# Patient Record
Sex: Female | Born: 1980 | Race: Black or African American | Hispanic: No | Marital: Single | Smoking: Current every day smoker
Health system: Southern US, Community
[De-identification: ages and names within clinical notes are randomized; demographics above are authoritative.]

## PROBLEM LIST (undated history)

## (undated) DIAGNOSIS — K5792 Diverticulitis of intestine, part unspecified, without perforation or abscess without bleeding: Secondary | ICD-10-CM

## (undated) DIAGNOSIS — I10 Essential (primary) hypertension: Secondary | ICD-10-CM

## (undated) DIAGNOSIS — E282 Polycystic ovarian syndrome: Secondary | ICD-10-CM

## (undated) DIAGNOSIS — E119 Type 2 diabetes mellitus without complications: Secondary | ICD-10-CM

## (undated) DIAGNOSIS — L732 Hidradenitis suppurativa: Secondary | ICD-10-CM

## (undated) DIAGNOSIS — K219 Gastro-esophageal reflux disease without esophagitis: Secondary | ICD-10-CM

## (undated) DIAGNOSIS — O24419 Gestational diabetes mellitus in pregnancy, unspecified control: Secondary | ICD-10-CM

## (undated) HISTORY — DX: Gastro-esophageal reflux disease without esophagitis: K21.9

## (undated) HISTORY — DX: Polycystic ovarian syndrome: E28.2

## (undated) HISTORY — DX: Diverticulitis of intestine, part unspecified, without perforation or abscess without bleeding: K57.92

## (undated) HISTORY — DX: Hidradenitis suppurativa: L73.2

---

## 1998-10-03 ENCOUNTER — Inpatient Hospital Stay (HOSPITAL_COMMUNITY): Admission: AD | Admit: 1998-10-03 | Discharge: 1998-10-03 | Payer: Self-pay | Admitting: *Deleted

## 1999-01-21 ENCOUNTER — Inpatient Hospital Stay (HOSPITAL_COMMUNITY): Admission: AD | Admit: 1999-01-21 | Discharge: 1999-01-21 | Payer: Self-pay | Admitting: *Deleted

## 2001-01-05 ENCOUNTER — Encounter: Payer: Self-pay | Admitting: Emergency Medicine

## 2001-01-05 ENCOUNTER — Emergency Department (HOSPITAL_COMMUNITY): Admission: EM | Admit: 2001-01-05 | Discharge: 2001-01-05 | Payer: Self-pay | Admitting: Emergency Medicine

## 2001-04-24 ENCOUNTER — Emergency Department (HOSPITAL_COMMUNITY): Admission: EM | Admit: 2001-04-24 | Discharge: 2001-04-24 | Payer: Self-pay

## 2002-03-24 ENCOUNTER — Inpatient Hospital Stay (HOSPITAL_COMMUNITY): Admission: AD | Admit: 2002-03-24 | Discharge: 2002-03-24 | Payer: Self-pay | Admitting: Obstetrics and Gynecology

## 2003-02-07 ENCOUNTER — Emergency Department (HOSPITAL_COMMUNITY): Admission: EM | Admit: 2003-02-07 | Discharge: 2003-02-08 | Payer: Self-pay | Admitting: Emergency Medicine

## 2003-02-08 ENCOUNTER — Emergency Department (HOSPITAL_COMMUNITY): Admission: EM | Admit: 2003-02-08 | Discharge: 2003-02-08 | Payer: Self-pay | Admitting: Emergency Medicine

## 2003-03-25 ENCOUNTER — Ambulatory Visit: Admission: RE | Admit: 2003-03-25 | Discharge: 2003-03-25 | Payer: Self-pay | Admitting: Emergency Medicine

## 2003-03-25 ENCOUNTER — Encounter: Payer: Self-pay | Admitting: Cardiology

## 2003-06-09 ENCOUNTER — Emergency Department (HOSPITAL_COMMUNITY): Admission: EM | Admit: 2003-06-09 | Discharge: 2003-06-09 | Payer: Self-pay | Admitting: Family Medicine

## 2003-06-10 ENCOUNTER — Emergency Department (HOSPITAL_COMMUNITY): Admission: EM | Admit: 2003-06-10 | Discharge: 2003-06-10 | Payer: Self-pay | Admitting: Family Medicine

## 2003-06-11 ENCOUNTER — Ambulatory Visit (HOSPITAL_COMMUNITY): Admission: RE | Admit: 2003-06-11 | Discharge: 2003-06-11 | Payer: Self-pay | Admitting: Otolaryngology

## 2003-06-12 ENCOUNTER — Ambulatory Visit (HOSPITAL_COMMUNITY): Admission: RE | Admit: 2003-06-12 | Discharge: 2003-06-12 | Payer: Self-pay | Admitting: Internal Medicine

## 2003-06-12 ENCOUNTER — Encounter: Admission: RE | Admit: 2003-06-12 | Discharge: 2003-06-12 | Payer: Self-pay | Admitting: Internal Medicine

## 2003-06-18 ENCOUNTER — Encounter: Admission: RE | Admit: 2003-06-18 | Discharge: 2003-06-18 | Payer: Self-pay | Admitting: Internal Medicine

## 2003-07-21 ENCOUNTER — Emergency Department (HOSPITAL_COMMUNITY): Admission: EM | Admit: 2003-07-21 | Discharge: 2003-07-21 | Payer: Self-pay | Admitting: Family Medicine

## 2003-09-15 ENCOUNTER — Emergency Department (HOSPITAL_COMMUNITY): Admission: EM | Admit: 2003-09-15 | Discharge: 2003-09-15 | Payer: Self-pay | Admitting: Family Medicine

## 2004-05-09 ENCOUNTER — Emergency Department (HOSPITAL_COMMUNITY): Admission: EM | Admit: 2004-05-09 | Discharge: 2004-05-09 | Payer: Self-pay | Admitting: Family Medicine

## 2004-07-29 ENCOUNTER — Emergency Department (HOSPITAL_COMMUNITY): Admission: EM | Admit: 2004-07-29 | Discharge: 2004-07-29 | Payer: Self-pay | Admitting: Family Medicine

## 2004-08-03 ENCOUNTER — Ambulatory Visit: Payer: Self-pay | Admitting: Internal Medicine

## 2004-09-11 ENCOUNTER — Emergency Department (HOSPITAL_COMMUNITY): Admission: EM | Admit: 2004-09-11 | Discharge: 2004-09-11 | Payer: Self-pay | Admitting: Family Medicine

## 2004-10-11 ENCOUNTER — Emergency Department (HOSPITAL_COMMUNITY): Admission: EM | Admit: 2004-10-11 | Discharge: 2004-10-11 | Payer: Self-pay | Admitting: Family Medicine

## 2004-12-30 ENCOUNTER — Emergency Department (HOSPITAL_COMMUNITY): Admission: EM | Admit: 2004-12-30 | Discharge: 2004-12-30 | Payer: Self-pay | Admitting: Family Medicine

## 2005-05-11 ENCOUNTER — Emergency Department (HOSPITAL_COMMUNITY): Admission: EM | Admit: 2005-05-11 | Discharge: 2005-05-11 | Payer: Self-pay | Admitting: Family Medicine

## 2005-09-23 ENCOUNTER — Emergency Department (HOSPITAL_COMMUNITY): Admission: EM | Admit: 2005-09-23 | Discharge: 2005-09-23 | Payer: Self-pay | Admitting: Emergency Medicine

## 2006-03-02 ENCOUNTER — Ambulatory Visit: Payer: Self-pay | Admitting: Internal Medicine

## 2006-03-02 DIAGNOSIS — K219 Gastro-esophageal reflux disease without esophagitis: Secondary | ICD-10-CM | POA: Insufficient documentation

## 2006-03-21 ENCOUNTER — Encounter (INDEPENDENT_AMBULATORY_CARE_PROVIDER_SITE_OTHER): Payer: Self-pay | Admitting: Internal Medicine

## 2006-03-21 ENCOUNTER — Ambulatory Visit: Payer: Self-pay | Admitting: *Deleted

## 2006-03-21 LAB — CONVERTED CEMR LAB
BUN: 8 mg/dL
CO2: 21 meq/L
Calcium: 9 mg/dL
Chloride: 107 meq/L
Cholesterol: 183 mg/dL
Creatinine, Ser: 0.68 mg/dL
Glucose, Bld: 116 mg/dL — ABNORMAL HIGH
HDL: 33 mg/dL — ABNORMAL LOW
LDL Cholesterol: 122 mg/dL — ABNORMAL HIGH
Potassium: 4.1 meq/L
Sodium: 141 meq/L
Total CHOL/HDL Ratio: 5.5
Triglycerides: 141 mg/dL
VLDL: 28 mg/dL

## 2006-04-17 ENCOUNTER — Emergency Department (HOSPITAL_COMMUNITY): Admission: EM | Admit: 2006-04-17 | Discharge: 2006-04-17 | Payer: Self-pay | Admitting: Emergency Medicine

## 2006-07-13 ENCOUNTER — Ambulatory Visit: Payer: Self-pay | Admitting: Internal Medicine

## 2006-07-13 ENCOUNTER — Encounter (INDEPENDENT_AMBULATORY_CARE_PROVIDER_SITE_OTHER): Payer: Self-pay | Admitting: Internal Medicine

## 2006-07-13 LAB — CONVERTED CEMR LAB
Hemoglobin, Urine: NEGATIVE
Nitrite: NEGATIVE
Specific Gravity, Urine: 1.031 — ABNORMAL HIGH (ref 1.005–1.03)
Trichomonal Vaginitis: NEGATIVE
Urine Glucose: NEGATIVE mg/dL
Urobilinogen, UA: 1 (ref 0.0–1.0)

## 2006-07-16 ENCOUNTER — Telehealth (INDEPENDENT_AMBULATORY_CARE_PROVIDER_SITE_OTHER): Payer: Self-pay | Admitting: *Deleted

## 2006-07-27 ENCOUNTER — Ambulatory Visit: Payer: Self-pay | Admitting: Internal Medicine

## 2006-07-27 DIAGNOSIS — E282 Polycystic ovarian syndrome: Secondary | ICD-10-CM | POA: Insufficient documentation

## 2006-10-16 ENCOUNTER — Telehealth (INDEPENDENT_AMBULATORY_CARE_PROVIDER_SITE_OTHER): Payer: Self-pay | Admitting: *Deleted

## 2006-10-17 ENCOUNTER — Encounter (INDEPENDENT_AMBULATORY_CARE_PROVIDER_SITE_OTHER): Payer: Self-pay | Admitting: Internal Medicine

## 2006-10-17 ENCOUNTER — Ambulatory Visit: Payer: Self-pay | Admitting: Internal Medicine

## 2006-11-22 ENCOUNTER — Encounter (INDEPENDENT_AMBULATORY_CARE_PROVIDER_SITE_OTHER): Payer: Self-pay | Admitting: Internal Medicine

## 2006-11-26 ENCOUNTER — Ambulatory Visit: Payer: Self-pay | Admitting: Hospitalist

## 2006-12-05 ENCOUNTER — Encounter (INDEPENDENT_AMBULATORY_CARE_PROVIDER_SITE_OTHER): Payer: Self-pay | Admitting: Internal Medicine

## 2006-12-06 ENCOUNTER — Emergency Department (HOSPITAL_COMMUNITY): Admission: EM | Admit: 2006-12-06 | Discharge: 2006-12-06 | Payer: Self-pay | Admitting: Emergency Medicine

## 2006-12-12 ENCOUNTER — Ambulatory Visit: Payer: Self-pay | Admitting: Obstetrics and Gynecology

## 2006-12-14 ENCOUNTER — Ambulatory Visit (HOSPITAL_COMMUNITY): Admission: RE | Admit: 2006-12-14 | Discharge: 2006-12-14 | Payer: Self-pay | Admitting: Obstetrics & Gynecology

## 2007-02-13 ENCOUNTER — Ambulatory Visit: Payer: Self-pay | Admitting: Obstetrics and Gynecology

## 2007-12-24 ENCOUNTER — Emergency Department (HOSPITAL_COMMUNITY): Admission: EM | Admit: 2007-12-24 | Discharge: 2007-12-24 | Payer: Self-pay | Admitting: Emergency Medicine

## 2008-02-24 ENCOUNTER — Ambulatory Visit: Payer: Self-pay | Admitting: Internal Medicine

## 2008-02-24 ENCOUNTER — Telehealth (INDEPENDENT_AMBULATORY_CARE_PROVIDER_SITE_OTHER): Payer: Self-pay | Admitting: Internal Medicine

## 2008-02-24 ENCOUNTER — Encounter (INDEPENDENT_AMBULATORY_CARE_PROVIDER_SITE_OTHER): Payer: Self-pay | Admitting: Internal Medicine

## 2008-02-24 DIAGNOSIS — E1165 Type 2 diabetes mellitus with hyperglycemia: Secondary | ICD-10-CM

## 2008-02-24 DIAGNOSIS — E1140 Type 2 diabetes mellitus with diabetic neuropathy, unspecified: Secondary | ICD-10-CM

## 2008-02-24 LAB — CONVERTED CEMR LAB
Chloride: 108 meq/L (ref 96–112)
Hgb A1c MFr Bld: 9.4 %
Hgb A1c MFr Bld: 9.8 % — ABNORMAL HIGH (ref 4.6–6.1)
Potassium: 3.8 meq/L (ref 3.5–5.3)

## 2008-03-04 ENCOUNTER — Ambulatory Visit: Payer: Self-pay | Admitting: *Deleted

## 2008-07-23 ENCOUNTER — Emergency Department (HOSPITAL_COMMUNITY): Admission: EM | Admit: 2008-07-23 | Discharge: 2008-07-23 | Payer: Self-pay | Admitting: Emergency Medicine

## 2008-12-02 ENCOUNTER — Ambulatory Visit: Payer: Self-pay | Admitting: Internal Medicine

## 2008-12-02 ENCOUNTER — Ambulatory Visit (HOSPITAL_COMMUNITY): Admission: RE | Admit: 2008-12-02 | Discharge: 2008-12-02 | Payer: Self-pay | Admitting: Internal Medicine

## 2008-12-02 ENCOUNTER — Encounter: Payer: Self-pay | Admitting: Internal Medicine

## 2008-12-02 DIAGNOSIS — R079 Chest pain, unspecified: Secondary | ICD-10-CM

## 2008-12-02 LAB — CONVERTED CEMR LAB
ALT: 13 units/L (ref 0–35)
Albumin: 3.9 g/dL (ref 3.5–5.2)
Alkaline Phosphatase: 55 units/L (ref 39–117)
BUN: 9 mg/dL (ref 6–23)
Blood Glucose, Fingerstick: 189
CO2: 25 meq/L (ref 19–32)
Calcium: 8.9 mg/dL (ref 8.4–10.5)
Chloride: 107 meq/L (ref 96–112)
Glucose, Bld: 171 mg/dL — ABNORMAL HIGH (ref 70–99)
HCT: 40 % (ref 36.0–46.0)
Hemoglobin: 13 g/dL (ref 12.0–15.0)
Hgb A1c MFr Bld: 7.3 %
MCV: 85.7 fL (ref >=78.0)
Platelets: 276 10*3/uL (ref 150–400)
Potassium: 4.2 meq/L (ref 3.5–5.3)
RBC: 4.67 M/uL (ref 3.87–5.11)
RDW: 13.4 % (ref 11.5–15.5)
Total Bilirubin: 0.2 mg/dL — ABNORMAL LOW (ref 0.3–1.2)

## 2008-12-04 ENCOUNTER — Ambulatory Visit: Payer: Self-pay | Admitting: Infectious Diseases

## 2008-12-04 ENCOUNTER — Encounter: Payer: Self-pay | Admitting: Internal Medicine

## 2008-12-04 LAB — CONVERTED CEMR LAB
LDL Cholesterol: 136 mg/dL — ABNORMAL HIGH (ref 0–99)
Total CHOL/HDL Ratio: 5.7
Triglycerides: 118 mg/dL (ref <150)
VLDL: 24 mg/dL (ref 0–40)

## 2009-03-02 ENCOUNTER — Emergency Department (HOSPITAL_COMMUNITY): Admission: EM | Admit: 2009-03-02 | Discharge: 2009-03-03 | Payer: Self-pay | Admitting: Emergency Medicine

## 2009-04-19 ENCOUNTER — Ambulatory Visit: Payer: Self-pay | Admitting: Internal Medicine

## 2009-04-22 ENCOUNTER — Emergency Department (HOSPITAL_COMMUNITY): Admission: EM | Admit: 2009-04-22 | Discharge: 2009-04-22 | Payer: Self-pay | Admitting: Family Medicine

## 2010-01-23 ENCOUNTER — Encounter: Payer: Self-pay | Admitting: Obstetrics and Gynecology

## 2010-02-01 NOTE — Assessment & Plan Note (Signed)
Summary: TB JOB/HAVE THE PAPER / SB.  Nurse Visit   Allergies: No Known Drug Allergies  Immunizations Administered:  PPD Skin Test:    Vaccine Type: PPD    Site: right forearm    Mfr: Sanofi Pasteur    Dose: 0.1 ml    Route: ID    Given by: Morrison Old RN    Exp. Date: 10/15/2010    Lot #: [id number]  Orders Added: 1)  TB Skin Test [86580] 2)  Admin 1st Vaccine [id number]

## 2010-03-15 ENCOUNTER — Inpatient Hospital Stay (INDEPENDENT_AMBULATORY_CARE_PROVIDER_SITE_OTHER)
Admission: RE | Admit: 2010-03-15 | Discharge: 2010-03-15 | Disposition: A | Discharge status: Home / Self Care | Payer: Self-pay | Source: Ambulatory Visit | Attending: Emergency Medicine | Admitting: Emergency Medicine

## 2010-03-15 ENCOUNTER — Inpatient Hospital Stay (HOSPITAL_COMMUNITY)
Admission: AD | Admit: 2010-03-15 | Discharge: 2010-03-16 | Disposition: A | Discharge status: Home / Self Care | Payer: Self-pay | Source: Ambulatory Visit | Attending: Obstetrics & Gynecology | Admitting: Obstetrics & Gynecology

## 2010-03-15 DIAGNOSIS — N949 Unspecified condition associated with female genital organs and menstrual cycle: Secondary | ICD-10-CM | POA: Insufficient documentation

## 2010-03-15 DIAGNOSIS — N920 Excessive and frequent menstruation with regular cycle: Secondary | ICD-10-CM

## 2010-03-15 DIAGNOSIS — R109 Unspecified abdominal pain: Secondary | ICD-10-CM | POA: Insufficient documentation

## 2010-03-15 DIAGNOSIS — N390 Urinary tract infection, site not specified: Secondary | ICD-10-CM | POA: Insufficient documentation

## 2010-03-15 DIAGNOSIS — R10819 Abdominal tenderness, unspecified site: Secondary | ICD-10-CM

## 2010-03-15 DIAGNOSIS — K5900 Constipation, unspecified: Secondary | ICD-10-CM

## 2010-03-15 LAB — CBC
Hemoglobin: 12.1 g/dL (ref 12.0–15.0)
MCH: 27.3 pg (ref 26.0–34.0)
MCHC: 31.8 g/dL (ref 30.0–36.0)
Platelets: 244 10*3/uL (ref 150–400)
RBC: 4.43 MIL/uL (ref 3.87–5.11)
RDW: 13.1 % (ref 11.5–15.5)
WBC: 14.1 10*3/uL — ABNORMAL HIGH (ref 4.0–10.5)

## 2010-03-15 LAB — DIFFERENTIAL
Basophils Absolute: 0.1 10*3/uL (ref 0.0–0.1)
Basophils Relative: 0 % (ref 0–1)
Eosinophils Absolute: 0.3 10*3/uL (ref 0.0–0.7)
Lymphocytes Relative: 27 % (ref 12–46)
Monocytes Absolute: 0.9 10*3/uL (ref 0.1–1.0)

## 2010-03-15 LAB — POCT URINALYSIS DIPSTICK: pH: 6 (ref 5.0–8.0)

## 2010-03-16 ENCOUNTER — Inpatient Hospital Stay (HOSPITAL_COMMUNITY): Payer: Self-pay

## 2010-03-16 LAB — COMPREHENSIVE METABOLIC PANEL
ALT: 12 U/L (ref 0–35)
AST: 11 U/L (ref 0–37)
Albumin: 3.4 g/dL — ABNORMAL LOW (ref 3.5–5.2)
Alkaline Phosphatase: 53 U/L (ref 39–117)
BUN: 5 mg/dL — ABNORMAL LOW (ref 6–23)
CO2: 26 mEq/L (ref 19–32)
Calcium: 8.7 mg/dL (ref 8.4–10.5)
Chloride: 104 mEq/L (ref 96–112)
Creatinine, Ser: 0.64 mg/dL (ref 0.4–1.2)
GFR calc Af Amer: >60 mL/min (ref >60)
GFR calc non Af Amer: >60 mL/min (ref >60)
Glucose, Bld: 178 mg/dL — ABNORMAL HIGH (ref 70–99)
Potassium: 3.7 mEq/L (ref 3.5–5.1)
Sodium: 136 mEq/L (ref 135–145)
Total Bilirubin: 0.4 mg/dL (ref 0.3–1.2)
Total Protein: 6.5 g/dL (ref 6.0–8.3)

## 2010-03-16 LAB — GC/CHLAMYDIA PROBE AMP, GENITAL
Chlamydia, DNA Probe: NEGATIVE
GC Probe Amp, Genital: NEGATIVE

## 2010-03-16 LAB — URINE MICROSCOPIC-ADD ON

## 2010-03-16 LAB — URINALYSIS, ROUTINE W REFLEX MICROSCOPIC
Bilirubin Urine: NEGATIVE
Glucose, UA: 100 mg/dL — AB
Ketones, ur: 15 mg/dL — AB
Leukocytes, UA: NEGATIVE
Nitrite: POSITIVE — AB
Protein, ur: NEGATIVE mg/dL
Specific Gravity, Urine: >1.03 — ABNORMAL HIGH (ref 1.005–1.030)
Urobilinogen, UA: 1 mg/dL (ref 0.0–1.0)
pH: 6 (ref 5.0–8.0)

## 2010-03-16 LAB — WET PREP, GENITAL: Trich, Wet Prep: NONE SEEN

## 2010-03-27 LAB — CBC
HCT: 37.7 % (ref 36.0–46.0)
Hemoglobin: 12.4 g/dL (ref 12.0–15.0)
MCHC: 33 g/dL (ref 30.0–36.0)
MCV: 86.4 fL (ref 78.0–100.0)
RDW: 13.6 % (ref 11.5–15.5)

## 2010-03-27 LAB — DIFFERENTIAL
Basophils Relative: 0 % (ref 0–1)
Eosinophils Absolute: 0.4 10*3/uL (ref 0.0–0.7)
Eosinophils Relative: 3 % (ref 0–5)
Lymphocytes Relative: 33 % (ref 12–46)
Lymphs Abs: 4.4 10*3/uL — ABNORMAL HIGH (ref 0.7–4.0)
Neutrophils Relative %: 57 % (ref 43–77)

## 2010-03-27 LAB — URINALYSIS, ROUTINE W REFLEX MICROSCOPIC
Bilirubin Urine: NEGATIVE
Glucose, UA: >1000 mg/dL — AB
Hgb urine dipstick: NEGATIVE
Nitrite: NEGATIVE
Protein, ur: NEGATIVE mg/dL
Specific Gravity, Urine: 1.028 (ref 1.005–1.030)

## 2010-03-27 LAB — URINE MICROSCOPIC-ADD ON

## 2010-03-27 LAB — COMPREHENSIVE METABOLIC PANEL
AST: 17 U/L (ref 0–37)
Alkaline Phosphatase: 59 U/L (ref 39–117)
Calcium: 8.7 mg/dL (ref 8.4–10.5)
Creatinine, Ser: 0.59 mg/dL (ref 0.4–1.2)
GFR calc Af Amer: >60 mL/min (ref >60)
Potassium: 3.4 mEq/L — ABNORMAL LOW (ref 3.5–5.1)

## 2010-04-05 LAB — GLUCOSE, CAPILLARY: Glucose-Capillary: 189 mg/dL — ABNORMAL HIGH (ref 70–99)

## 2010-04-10 LAB — POCT CARDIAC MARKERS
CKMB, poc: <1 ng/mL — ABNORMAL LOW (ref 1.0–8.0)
Myoglobin, poc: 33.5 ng/mL (ref 12–200)

## 2010-04-10 LAB — POCT I-STAT, CHEM 8
BUN: 9 mg/dL (ref 6–23)
Creatinine, Ser: 0.8 mg/dL (ref 0.4–1.2)
Potassium: 3.7 mEq/L (ref 3.5–5.1)
Sodium: 142 mEq/L (ref 135–145)

## 2010-04-20 ENCOUNTER — Ambulatory Visit (INDEPENDENT_AMBULATORY_CARE_PROVIDER_SITE_OTHER): Payer: Self-pay | Admitting: *Deleted

## 2010-04-20 DIAGNOSIS — Z111 Encounter for screening for respiratory tuberculosis: Secondary | ICD-10-CM

## 2010-05-10 ENCOUNTER — Encounter: Payer: Self-pay | Admitting: Internal Medicine

## 2010-05-17 NOTE — Group Therapy Note (Signed)
NAMEKARAH, CARUTHERS           ACCOUNT NO.:  [id number]   MEDICAL RECORD NO.:  [id number]          PATIENT TYPE:  WOC   LOCATION:  [facility]                   FACILITY:  [facility]   PHYSICIAN:  Andrew Au, MD        DATE OF BIRTH:  04-04-1980   DATE OF SERVICE:  12/12/2006                                  CLINIC NOTE   The patient is a 30 year old African American female nulligravida  weighing 299 pounds and is 6 feet tall, who was referred from the  emergency room where she was seen a couple months ago because of a  furuncle under the breasts.  She had a Pap smear in the recent past and  was referred because of amenorrhea.  In discussing with the patient she  goes sometimes more than a year without a period and extraordinarily  heavy. She has never been treated or told she had polycystic ovarian  syndrome and we have gone into detail on what this condition entails as  well as insulin sensitivity. After discussion with decided that we are  going to start the patient on Provera 10 mg b.i.d. for 5 days every 45-  60 days if she does not have a period.  See her back in a couple months,  review her laboratory tests. Consider putting her on Glucophage at that  time. Also we are suggesting she calls her primary to refer her to  neurosurgeon as it sounds from her history with a numbness in her arms  and pain down her back that she probably has a cervical disk or a  radiculopathy of some type. The patient is morbidly obese, has some  moderate hirsutism, especially on the face and lower abdomen and has not  had a period in 3 years.  I am also going to get an ultrasound to  evaluate the patient's ovaries.   IMPRESSION:  1. Polycystic ovarian syndrome.  2. Morbid obesity.           ______________________________  Andrew Au, MD     PR/MEDQ  D:  12/12/2006  T:  12/13/2006  Job:  [id number]

## 2010-05-17 NOTE — Group Therapy Note (Signed)
NAMEJILLANE, PO           ACCOUNT NO.:  [id number]   MEDICAL RECORD NO.:  [id number]          PATIENT TYPE:  WOC   LOCATION:  [facility]                   FACILITY:  [facility]   PHYSICIAN:  Andrew Au, MD        DATE OF BIRTH:  [birth year]   DATE OF SERVICE:                                  CLINIC NOTE   The patient is a 30 year old African American female, nulligravida  weighing 297 pounds and 6 feet tall who was in for follow up for  possible polycystic ovarian syndrome.  She had a random glucose of 133  at her last visit with a hemoglobin A1c of 6.9, slightly elevated, and  estradiol of 18.1.  We gave her withdrawal challenge with Provera, and  she did withdraw after several weeks.  She is back in for follow up.  She continues having neck and arm pain and radiating down and causing  numbness in her hand, especially on the right side.  She complains of  generalized myalgias and frequent chest pain which brought her into the  emergency room most recently at [facility].  She is in [place] and we are sending her for internal medicine evaluation.  Also, we are going to draw an [place], LH, and a free testosterone on the  patient, and we are starting her on Glucophage t.i.d. with meals.  I  want her to come back in a couple months to see if her periods have  gotten any better on that regime.  We may do a 3-hour glucose tolerance  at that time.  Also, I am going to refer for an MRI of the cervical  spine because I think she does have a radiculopathy of some type.   IMPRESSION:  1. Polycystic ovarian syndrome.  2. Morbid obesity.  3. Cervical radiculopathy, probable generalized myalgias.  4. Chest pain.           ______________________________  Andrew Au, MD     PR/MEDQ  D:  02/13/2007  T:  02/14/2007  Job:  [id number]

## 2010-08-16 ENCOUNTER — Encounter: Payer: Self-pay | Admitting: Internal Medicine

## 2010-08-30 ENCOUNTER — Encounter: Payer: Self-pay | Admitting: Internal Medicine

## 2010-10-07 LAB — CULTURE, ROUTINE-ABSCESS

## 2010-10-07 LAB — GLUCOSE, CAPILLARY: Glucose-Capillary: 310 mg/dL — ABNORMAL HIGH (ref 70–99)

## 2010-10-17 ENCOUNTER — Telehealth: Payer: Self-pay | Admitting: *Deleted

## 2010-10-17 NOTE — Telephone Encounter (Signed)
Pt calls c/o chest pain x 3 days, "stabbing", states she had this before in April and nothing was wrong, she is referred to the urg care for eval now and then is to call back for f/u appt w/ pcp. She is agreeable and states her mother will drive her to urg care.

## 2010-10-21 ENCOUNTER — Emergency Department (HOSPITAL_COMMUNITY): Payer: Self-pay

## 2010-10-21 ENCOUNTER — Emergency Department (HOSPITAL_COMMUNITY)
Admission: EM | Admit: 2010-10-21 | Discharge: 2010-10-22 | Disposition: A | Discharge status: Home / Self Care | Payer: Self-pay | Attending: Emergency Medicine | Admitting: Emergency Medicine

## 2010-10-21 DIAGNOSIS — M25579 Pain in unspecified ankle and joints of unspecified foot: Secondary | ICD-10-CM | POA: Insufficient documentation

## 2010-10-21 DIAGNOSIS — S93409A Sprain of unspecified ligament of unspecified ankle, initial encounter: Secondary | ICD-10-CM | POA: Insufficient documentation

## 2010-10-21 DIAGNOSIS — K219 Gastro-esophageal reflux disease without esophagitis: Secondary | ICD-10-CM | POA: Insufficient documentation

## 2010-10-21 DIAGNOSIS — X500XXA Overexertion from strenuous movement or load, initial encounter: Secondary | ICD-10-CM | POA: Insufficient documentation

## 2010-10-21 DIAGNOSIS — E669 Obesity, unspecified: Secondary | ICD-10-CM | POA: Insufficient documentation

## 2010-10-21 DIAGNOSIS — M25473 Effusion, unspecified ankle: Secondary | ICD-10-CM | POA: Insufficient documentation

## 2010-10-21 DIAGNOSIS — M25476 Effusion, unspecified foot: Secondary | ICD-10-CM | POA: Insufficient documentation

## 2010-10-21 DIAGNOSIS — R079 Chest pain, unspecified: Secondary | ICD-10-CM | POA: Insufficient documentation

## 2010-10-21 DIAGNOSIS — F17200 Nicotine dependence, unspecified, uncomplicated: Secondary | ICD-10-CM | POA: Insufficient documentation

## 2010-10-21 DIAGNOSIS — E119 Type 2 diabetes mellitus without complications: Secondary | ICD-10-CM | POA: Insufficient documentation

## 2010-10-22 LAB — DIFFERENTIAL
Basophils Absolute: 0 10*3/uL (ref 0.0–0.1)
Basophils Relative: 0 % (ref 0–1)
Eosinophils Absolute: 0.4 K/uL (ref 0.0–0.7)
Eosinophils Relative: 3 % (ref 0–5)
Lymphocytes Relative: 40 % (ref 12–46)
Lymphs Abs: 5.2 K/uL — ABNORMAL HIGH (ref 0.7–4.0)
Monocytes Absolute: 0.8 10*3/uL (ref 0.1–1.0)
Monocytes Relative: 6 % (ref 3–12)
Neutro Abs: 6.7 10*3/uL (ref 1.7–7.7)
Neutrophils Relative %: 51 % (ref 43–77)

## 2010-10-22 LAB — CBC
HCT: 38.2 % (ref 36.0–46.0)
Hemoglobin: 12.4 g/dL (ref 12.0–15.0)
MCH: 27.3 pg (ref 26.0–34.0)
MCHC: 32.5 g/dL (ref 30.0–36.0)
MCV: 84 fL (ref 78.0–100.0)
Platelets: 252 K/uL (ref 150–400)
RBC: 4.55 MIL/uL (ref 3.87–5.11)
RDW: 13.3 % (ref 11.5–15.5)
WBC: 13.1 K/uL — ABNORMAL HIGH (ref 4.0–10.5)

## 2010-10-22 LAB — POCT I-STAT TROPONIN I

## 2010-10-22 LAB — POCT I-STAT, CHEM 8
BUN: 5 mg/dL — ABNORMAL LOW (ref 6–23)
Chloride: 107 mEq/L (ref 96–112)
Creatinine, Ser: 0.6 mg/dL (ref 0.50–1.10)
Glucose, Bld: 131 mg/dL — ABNORMAL HIGH (ref 70–99)
Hemoglobin: 13.9 g/dL (ref 12.0–15.0)
Potassium: 3.7 mEq/L (ref 3.5–5.1)
Sodium: 142 mEq/L (ref 135–145)

## 2010-10-22 LAB — CK TOTAL AND CKMB (NOT AT ARMC)
CK, MB: 2.3 ng/mL (ref 0.3–4.0)
Relative Index: 1.8 (ref 0.0–2.5)
Total CK: 125 U/L (ref 7–177)

## 2010-10-22 LAB — POCT PREGNANCY, URINE: Preg Test, Ur: NEGATIVE

## 2011-03-07 ENCOUNTER — Encounter: Payer: Self-pay | Admitting: Internal Medicine

## 2011-04-19 ENCOUNTER — Ambulatory Visit (INDEPENDENT_AMBULATORY_CARE_PROVIDER_SITE_OTHER): Payer: Self-pay | Admitting: *Deleted

## 2011-04-19 DIAGNOSIS — Z111 Encounter for screening for respiratory tuberculosis: Secondary | ICD-10-CM

## 2011-05-03 ENCOUNTER — Encounter: Payer: Self-pay | Admitting: Internal Medicine

## 2011-05-03 ENCOUNTER — Ambulatory Visit (INDEPENDENT_AMBULATORY_CARE_PROVIDER_SITE_OTHER): Payer: Self-pay | Admitting: Internal Medicine

## 2011-05-03 ENCOUNTER — Encounter: Payer: Self-pay | Admitting: Licensed Clinical Social Worker

## 2011-05-03 VITALS — BP 104/65 | HR 74 | Temp 97.6°F | Ht 73.0 in | Wt 268.9 lb

## 2011-05-03 DIAGNOSIS — R079 Chest pain, unspecified: Secondary | ICD-10-CM

## 2011-05-03 DIAGNOSIS — R109 Unspecified abdominal pain: Secondary | ICD-10-CM | POA: Insufficient documentation

## 2011-05-03 DIAGNOSIS — Z598 Other problems related to housing and economic circumstances: Secondary | ICD-10-CM

## 2011-05-03 DIAGNOSIS — E119 Type 2 diabetes mellitus without complications: Secondary | ICD-10-CM

## 2011-05-03 DIAGNOSIS — Z111 Encounter for screening for respiratory tuberculosis: Secondary | ICD-10-CM

## 2011-05-03 DIAGNOSIS — E282 Polycystic ovarian syndrome: Secondary | ICD-10-CM

## 2011-05-03 DIAGNOSIS — Z3400 Encounter for supervision of normal first pregnancy, unspecified trimester: Secondary | ICD-10-CM

## 2011-05-03 DIAGNOSIS — Z7251 High risk heterosexual behavior: Secondary | ICD-10-CM

## 2011-05-03 DIAGNOSIS — Z5987 Material hardship due to limited financial resources, not elsewhere classified: Secondary | ICD-10-CM

## 2011-05-03 DIAGNOSIS — Z0000 Encounter for general adult medical examination without abnormal findings: Secondary | ICD-10-CM | POA: Insufficient documentation

## 2011-05-03 LAB — COMPREHENSIVE METABOLIC PANEL
ALT: 9 U/L (ref 0–35)
BUN: 6 mg/dL (ref 6–23)
Calcium: 8.8 mg/dL (ref 8.4–10.5)
Glucose, Bld: 103 mg/dL — ABNORMAL HIGH (ref 70–99)
Potassium: 3.9 mEq/L (ref 3.5–5.3)
Sodium: 139 mEq/L (ref 135–145)
Total Bilirubin: 0.5 mg/dL (ref 0.3–1.2)
Total Protein: 6.3 g/dL (ref 6.0–8.3)

## 2011-05-03 LAB — LIPID PANEL
Cholesterol: 153 mg/dL (ref 0–200)
Total CHOL/HDL Ratio: 5.1 Ratio
VLDL: 22 mg/dL (ref 0–40)

## 2011-05-03 LAB — POCT URINE PREGNANCY: Preg Test, Ur: POSITIVE

## 2011-05-03 MED ORDER — PRENATAL MULTIVITAMIN CH: 1.0000 | ORAL_TABLET | Freq: Every day | ORAL | Status: DC

## 2011-05-03 MED ORDER — TUBERCULIN PPD 5 UNIT/0.1ML ID SOLN: 5.0000 [IU] | Freq: Once | INTRADERMAL | Status: DC

## 2011-05-03 NOTE — Progress Notes (Signed)
Subjective:     Patient ID: Misty [name], female   DOB: 06/20/1980, 31 y.o.   MRN: [id number]  HPI  Pt is a 31 y/o F presenting today for routine f/u.  She has not been seen in over one year and has not been taking any medications.  She states her LMP was in Feb 2013 and she is now experiencing lower abdominal pain and bilateral breast tenderness.  She describes both the breast and belly pain as intermittent.  She notes the abdominal pain radiates down into her vaginal and also around into her hips.  She believes she has lost weight.  She admits to nausea that occurs with the smell of food and she has vomited on a few occasions.  The nausea and vomiting occurred at different times of the day.  Describes her vomitus as food and denies bilious or bloody vomit.  She denies h/a, chills, and fever.  She denies vaginal or nipple discharge.  She denies vaginal bleeding.  She has not used any method of birth control b/c she thought she couldn't get pregnant with PCOS.  She admits to being sexually active with her boyfriend of 8 months; they are in a monogamous relationship.  She admits to a long standing hx of irregular periods but notes this improved with time and over the past year she's had regular, monthly cycles until feb 2013.  Review of Systems  Constitutional: Negative for fever, chills, diaphoresis, activity change, appetite change, fatigue and unexpected weight change.  HENT: Negative for hearing loss, congestion and neck stiffness.   Eyes: Negative for photophobia, pain and visual disturbance.  Respiratory: Negative for cough, chest tightness, shortness of breath and wheezing.   Cardiovascular: Negative for chest pain and palpitations.  Gastrointestinal: Negative for abdominal pain, blood in stool and anal bleeding.  Genitourinary: Negative for dysuria, hematuria and difficulty urinating.  Musculoskeletal: Negative for joint swelling.  Neurological: Negative for dizziness, syncope, speech  difficulty, weakness, numbness and headaches. ]     Objective:   Physical Exam VItal signs reviewed and stable. GEN: No apparent distress.  Alert and oriented x 3.  Pleasant, conversant, and cooperative to exam. HEENT: head is autraumatic and normocephalic.  Neck is supple without palpable masses or lymphadenopathy.  No JVD or carotid bruits.  Vision intact.  EOMI.  PERRLA.  Sclerae anicteric.  Conjunctivae without pallor or injection. Mucous membranes are moist.  Oropharynx is without erythema, exudates, or other abnormal lesions.  RESP:  Lungs are clear to ascultation bilaterally with good air movement.  No wheezes, ronchi, or rubs. CARDIOVASCULAR: regular rate, normal rhythm.  Clear S1, S2, no murmurs, gallops, or rubs. ABDOMEN: soft, protuberant, non-tender, non-distended.  Bowels sounds present in all quadrants and slightly diminished.  No palpable masses.  So suprapubic or CVA tenderness EXT: warm and dry.  Peripheral pulses equal, intact, and +2 globally.  No clubbing or cyanosis.  Trace edema in bilateral lower extremities. SKIN: warm and dry with normal turgor.  No rashes or abnormal lesions observed. NEURO: CN II-XII grossly intact.  Muscle strength +5/5 in bilateral upper and lower extremities.  Sensation is grossly intact.  No focal deficit.     Assessment/Plan:

## 2011-05-03 NOTE — Assessment & Plan Note (Addendum)
Patient request placement of PPD test today as a requirement of her job.  Pregnancy is not an indication to this therefore we'll place today and have her return on Friday for interpretation of PPD results.

## 2011-05-03 NOTE — Progress Notes (Signed)
CSW had the opportunity to meet with Ms. Younger and her mother during her scheduled medical appointment.  Ms. Younger informed today of positive pregnancy test.  CSW provided pt with information on Maternity Services through [employer], contact number for the Dept of Social Services to apply for [employer], and [facility] for referrals on pregnancy options (parenting, adoption and abortion).  CSW also provided pt with contact information.  Pt denies add'l needs at this time.  Pt aware CSW is available to assist as needed.

## 2011-05-03 NOTE — Assessment & Plan Note (Addendum)
Given her report of unprotected sex, will check urine for GC, chalmydia, and trich.  Will also check HIV Abs and urine pregnancy test as outlined below.  Counseled patient on importance of contraception to not only prevent pregnancy but to reduce risk of STI.

## 2011-05-03 NOTE — Patient Instructions (Addendum)
We will schedule you an appointment with an obstetrician.  This is very important. Do not miss this appointment. You have been given a prescription for prenatal vitamins. Start taking one vitamin daily to keep you and your baby healthy. You will need to go to the [facility] office and apply for assistance to help with the pregnancy. Come back to the clinic this Friday for check of your ppd. Please do not hesitate to call the clinic at [phone] and ask to speak with Dr. Jerelene Redden or our social worker regarding women's health/pregnancy or adoption services.  We will provide you with a list of pregnancy resources in the area including contact information.  Schedule a time to meet with Bonna Gains to talk about getting the [facility].  This Card will help you be able to get medications and doctor visits. Items required to complete an Eligibility Application   1. Picture ID (Can't be expired) 2. Current Bill to establish proof of residency 3. W-2 & Tax return (if self-employed include "Schedule C"), if not filing Form 4506 4. 4 current Pay stubs for this year 5. Printout of other income (Social security, unemployment, child support, workmen's comp) 6. Food stamp award letter, if receiving  7. Life Insurance (Need copy of the front page, showing name Ins Co. Name, and face amount). 8. Statement for pension, 401-K, IRS (needs to have current balance) 9. Tax Value for cars, houses, mobile homes, and land (Get from [facility] Tax Department) 10. Disability Paperwork (showing status of case) 11. College students: Print out of [place] received, tuition cost, books, etc. 12. If no Income: Games developer of support for free shelter, money, food, Social research officer, government.  Bring all that you can to your follow up appointment to start the process.

## 2011-05-03 NOTE — Assessment & Plan Note (Signed)
Pt is uninsured.  I have asked her to meet with Bonna Gains regarding the orange card.  Will send her a list of required documents.

## 2011-05-03 NOTE — Assessment & Plan Note (Addendum)
Patient has not been taking metformin for many months. Will check CMET, lipids, and urine microalbumin/Cr today.  Hemoglobin A1c within normal limits at 7.1 today.  Will not resume metformin at this time. Patient is encouraged to continue with healthy diet and lifestyle activities.  She will need close monitoring of her blood glucose levels during her pregnancy.

## 2011-05-03 NOTE — Assessment & Plan Note (Addendum)
Urine pregnancy test returned positive.  This was a very unexpected result for the patient. I spent approximately 45 minutes speaking with both patient and her mother regarding positive results of urine pregnancy test.  I expect the patient and is in her first trimester giving the date of her last menstrual period.  Will refer her to an obstetrician today for additional evaluation and management of her pregnancy.  Results of Chlamydia, gonorrhea, Trichomonas, and HIV testing are pending at this time.   Patient is currently not taking any teratogenic medications.  Reviewed options available to patient regarding her pregnancy, regarding options for pregnancy termination, pregnancy to term, adoption services and raising the child herself.  I will send patient home with basic information about medications to avoid during pregnancy, first and second trimester symptoms/issues/problems to account for, as well as information about morning sickness.  Social work is also on board and has provided patient with information regarding seeking Medicaid assistance for her pregnancy as well as other information regarding women's health services, including adoption services and abortion providers so that patient is fully informed and able to seek out any option she decides is in her best interest.  Will send her home with a rx for prenatal vitamins.  Pt instructed to begin taking one tablet daily starting today

## 2011-05-03 NOTE — Assessment & Plan Note (Addendum)
Given her lack of contraception use and no menstruation for the past 3 months, pregnancy is a concern.  Urine pregnancy test is positive.  I believe her symptoms of abdominal discomfort, nausea with intermittent vomiting, and breast tenderness for the results of her pregnancy.  Please see discussion above under "pregnancy, first" for additional details regarding the assessment and plan

## 2011-05-03 NOTE — Assessment & Plan Note (Signed)
>>  ASSESSMENT AND PLAN FOR DIABETES TYPE 2, UNCONTROLLED WRITTEN ON 05/03/2011 11:01 AM BY MILLS, KRISTIN T, MD  Patient has not been taking metformin for many months. Will check CMET, lipids, and urine microalbumin/Cr today.  Hemoglobin A1c within normal limits at 7.1 today.  Will not resume metformin at this time. Patient is encouraged to continue with healthy diet and lifestyle activities.  She will need close monitoring of her blood glucose levels during her pregnancy.

## 2011-05-04 LAB — HIV ANTIBODY (ROUTINE TESTING W REFLEX): HIV: NONREACTIVE

## 2011-05-04 LAB — MICROALBUMIN / CREATININE URINE RATIO
Creatinine, Urine: 255.8 mg/dL
Microalb, Ur: 1.23 mg/dL (ref 0.00–1.89)

## 2011-05-05 LAB — TB SKIN TEST: TB Skin Test: NEGATIVE mm

## 2011-05-10 ENCOUNTER — Encounter (HOSPITAL_COMMUNITY): Payer: Self-pay

## 2011-05-10 ENCOUNTER — Inpatient Hospital Stay (HOSPITAL_COMMUNITY): Payer: Medicaid Other

## 2011-05-10 ENCOUNTER — Inpatient Hospital Stay (HOSPITAL_COMMUNITY)
Admission: AD | Admit: 2011-05-10 | Discharge: 2011-05-10 | Disposition: A | Discharge status: Home / Self Care | Payer: Medicaid Other | Source: Ambulatory Visit | Attending: Obstetrics & Gynecology | Admitting: Obstetrics & Gynecology

## 2011-05-10 DIAGNOSIS — A499 Bacterial infection, unspecified: Secondary | ICD-10-CM | POA: Insufficient documentation

## 2011-05-10 DIAGNOSIS — Z3490 Encounter for supervision of normal pregnancy, unspecified, unspecified trimester: Secondary | ICD-10-CM

## 2011-05-10 DIAGNOSIS — R109 Unspecified abdominal pain: Secondary | ICD-10-CM | POA: Insufficient documentation

## 2011-05-10 DIAGNOSIS — O2390 Unspecified genitourinary tract infection in pregnancy, unspecified trimester: Secondary | ICD-10-CM | POA: Insufficient documentation

## 2011-05-10 DIAGNOSIS — B9689 Other specified bacterial agents as the cause of diseases classified elsewhere: Secondary | ICD-10-CM | POA: Insufficient documentation

## 2011-05-10 DIAGNOSIS — N760 Acute vaginitis: Secondary | ICD-10-CM | POA: Insufficient documentation

## 2011-05-10 DIAGNOSIS — Z1389 Encounter for screening for other disorder: Secondary | ICD-10-CM

## 2011-05-10 LAB — WET PREP, GENITAL

## 2011-05-10 MED ORDER — METRONIDAZOLE 500 MG PO TABS: 500.0000 mg | ORAL_TABLET | Freq: Two times a day (BID) | ORAL | Status: AC

## 2011-05-10 NOTE — MAU Provider Note (Signed)
Chief Complaint:  Abdominal Pain    First Provider Initiated Contact with Patient 05/10/11 1623      Misty [name] is  31 y.o. G1P0.  Patient's last menstrual period was 03/02/2011.Marland Kitchen  Her pregnancy status is positive.  She presents complaining of Abdominal Pain . Onset is described as intermittent and has been present for  1 weeks. + UPT at Dr. Jerelene Redden office last week. On-going intermittent lower abd cramping. Denies vaginal blding, discharge, dysuria, or back pain.  Obstetrical/Gynecological History: OB History    Grav Para Term Preterm Abortions TAB SAB Ect Mult Living   1               Past Medical History: Past Medical History  Diagnosis Date  . PCOD (polycystic ovarian disease)     Follow[facility] by [facility], has been on metformin  . Chest pain     multiple ED evaluations, most likely musculoskeletal (cervical radiculopathy), needs MRI spine  . GERD (gastroesophageal reflux disease)   . Diabetes mellitus     Past Surgical History: History reviewed. No pertinent past surgical history.  Family History: Family History  Problem Relation Age of Onset  . Diabetes Mother   . Hypertension Mother   . Heart disease Father   . Diabetes Sister   . Hypertension Sister   . Diabetes Brother   . Cancer Maternal Grandmother   . Cancer Paternal Grandmother     Social History: History  Substance Use Topics  . Smoking status: Current Everyday Smoker -- 0.5 packs/day    Types: Cigarettes  . Smokeless tobacco: Not on file  . Alcohol Use: No    Allergies: No Known Allergies  Prescriptions prior to admission  Medication Sig Dispense Refill  . acetaminophen (TYLENOL) 500 MG tablet Take 500 mg by mouth every 6 (six) hours as needed. For pain      . Prenatal Vit-Fe Fumarate-FA (PRENATAL MULTIVITAMIN) TABS Take 1 tablet by mouth daily.  30 tablet  11    Review of Systems - General ROS: negative Respiratory ROS: no cough, shortness of breath, or wheezing Cardiovascular ROS: no chest  pain or dyspnea on exertion Gastrointestinal ROS: positive for - abdominal pain Genito-Urinary ROS: no dysuria, trouble voiding, or hematuria  Physical Exam   Blood pressure 124/51, pulse 99, temperature 98.8 F (37.1 C), temperature source Oral, resp. rate 16, height 6' 1"  (1.854 m), weight 268 lb (121.564 kg), last menstrual period 03/02/2011, SpO2 100.00%.  General: General appearance - alert, well appearing, and in no distress, oriented to person, place, and time and overweight Mental status - alert, oriented to person, place, and time, normal mood, behavior, speech, dress, motor activity, and thought processes, affect appropriate to mood Abdomen - soft, nontender, nondistended, no masses or organomegaly Musculoskeletal - no joint tenderness, deformity or swelling Focused Gynecological Exam: VULVA: normal appearing vulva with no masses, tenderness or lesions, VAGINA: normal appearing vagina with normal color and discharge, no lesions, CERVIX: normal appearing cervix without discharge or lesions, UTERUS: enlarged, non tender, ADNEXA: normal adnexa in size, nontender and no masses  Labs: Recent Results (from the past 24 hour(s))  POCT PREGNANCY, URINE   Collection Time   05/10/11  3:08 PM      Component Value Range   Preg Test, Ur POSITIVE (*) NEGATIVE    Imaging Studies:  *RADIOLOGY REPORT*  Clinical Data:  Positive pregnancy test. Left lower quadrant pain.  OBSTETRIC <14 WK ULTRASOUND  Technique: Transabdominal ultrasound was performed for evaluation  of  the gestation as well as the maternal uterus and adnexal  regions.  Comparison: None.  Intrauterine gestational sac:  Single intrauterine station of sac visualized. Gestational sac has  an unremarkable position and configuration.  Yolk sac: Visualized  Embryo: Visualized  Cardiac Activity: Visualized  CRL: 12.4 mm 7w 4.d [place] EDC: 12/23/2011  Maternal uterus/Adnexae:  No evidence for subchorionic hemorrhage. The maternal  ovaries are  unremarkable with probable corpus luteum cyst on the left. No  evidence for free fluid in the cul-de-sac.  IMPRESSION:  Single living intrauterine gestation at estimated 7-week-4-day  gestational age by crown-rump length.  Original Report Authenticated By: ERIC A. MANSELL, M.D.  Assessment: 1. Normal IUP (intrauterine pregnancy) on prenatal ultrasound   2. BV (bacterial vaginosis)      Plan: Discharge home Rx Flagyl FU with OB Provider of choice Referral list and verification letter given  Cadon Raczka E. 05/10/2011,5:23 PM

## 2011-05-10 NOTE — MAU Note (Signed)
Pt states hx abnormal menstrual cycles. Had +upt 05/03/2011, lmp 03/02/2011. Denies vag d/c changes or bleeding. Hx PCOS. Goes to [facility].

## 2011-05-10 NOTE — MAU Provider Note (Signed)
Attestation of Attending Supervision of Advanced Practitioner: Evaluation and management procedures were performed by the [facility] Fellow/PA/CNM/NP under my supervision and collaboration. Chart reviewed, and agree with management and plan.  Verita Schneiders, M.D. 05/10/2011 6:53 PM

## 2011-05-10 NOTE — Discharge Instructions (Signed)
Prenatal Care Providers [facility]  [phone]     Phone: [place]                      [facility]  @[place]     Phone: [phone]  Phone: [place]     Phone: [phone]  Phone: [place] [facility] [place]                hone: [phone]  Phone: [phone]         [facility] [address]      Phone: [phone]  Phone: [phone]         [place] Dept.                Phone: [phone]  [place])          Phone: [phone] [facility]   Phone: [phone]   ________________________________________     To schedule your Maternity Eligibility Appointment, please call [phone].  When you arrive for your appointment you must bring the following items or information listed below.  Your appointment will be rescheduled if you do not have these items or are 15 minutes late. If currently receiving Medicaid, you MUST bring: 1. Medicaid Card 2. Social Security Card 3. Picture ID 4. Proof of Pregnancy 5. Verification of current address if the address on Medicaid card is incorrect "postmarked mail" If not receiving Medicaid, you MUST bring: 1. Social Security Card 2. Picture ID 3. Birth Certificate (if available) Passport or *Green Card 4. Proof of Pregnancy 5. Verification of current address "postmarked mail" for each income presented. 6. Verification of insurance coverage, if any 7. Check stubs from each employer for the previous month (if unable to present check stub  for each week, we will accept check stub for the first and last week ill the same month.) If you can't locate check stubs, you must bring a letter from the  employer(s) and it must have the following information on letterhead, typed, in English: o name of [place] telephone number o how long been with the company, if less than one month o how much person earns per hour o how many hours per week work o the gross pay the person earned for the previous month If you are 33 years old or less, you do not have to bring proof of income unless you work or live with the father of the baby and at that time we will need proof of income from you and/or the father of the baby. Green Card recipients are eligible for Medicaid for Pregnant Women (MPW)   Bacterial Vaginosis Bacterial vaginosis is an infection of the vagina. A healthy vagina has many kinds of good germs (bacteria). Sometimes the number of good germs can change. This allows bad germs to move in and cause an infection. You may be given medicine (antibiotics) to treat the infection. Or, you may not need treatment at all. HOME CARE  Take your medicine as told. Finish them even if you start to feel better.  Do not have sex until you finish your medicine.   Do not douche.   Practice safe sex.   Tell your sex partner that you have an infection. They should see their doctor for treatment if they have problems.  GET HELP RIGHT AWAY IF:  You do not get better after 3 days of treatment.   You have grey fluid (discharge) coming from your vagina.   You have pain.   You have a temperature of 102 F (38.9 C) or higher.  MAKE SURE YOU:   Understand these instructions.   Will watch your condition.   Will get help right away if you are not doing well or get worse.  Document Released: 09/28/2007 Document Revised: 12/08/2010 Document Reviewed: 09/28/2007 [facility] Patient Information [address] [place].

## 2011-05-11 LAB — GC/CHLAMYDIA PROBE AMP, GENITAL
Chlamydia, DNA Probe: NEGATIVE
GC Probe Amp, Genital: NEGATIVE

## 2011-05-25 ENCOUNTER — Telehealth: Payer: Self-pay | Admitting: *Deleted

## 2011-05-25 NOTE — Telephone Encounter (Signed)
Still has not completed papers for Medicaid - will try to do asap. Medicaid office will discuss with pt OB options for care per [facility]. Hilda Blades Shaleta Ruacho RN 05/25/11 3:30PM

## 2011-05-26 ENCOUNTER — Telehealth: Payer: Self-pay | Admitting: *Deleted

## 2011-05-26 NOTE — Telephone Encounter (Signed)
MS Aja CALLED BACK, INSTRUCTED PATIENT TO CALL AND MAKE APPT WITH THE HEALTH DEPARTMENT FOR OB SERVICES. IF SHE IS CONSIDERED HIGH RISK THEN THEY WOULD SEND HER TO THE [facility].  ASKED PATIENT TO CALL [facility] BACK IF AND WHEN SHE GETS A OB APPT . SO WE CAN KEEP IN TOUCH.  Misty [name] NT [phone]  12:06PM

## 2011-05-26 NOTE — Telephone Encounter (Signed)
CALLED AND LEFT VOICE MESSAGE FOR PATIENT TO CALL  BACK, NEED TO KNOW IF SHE HAS GOTTEN OB APPT ANY WHERE. WHEN WE SPOKE Tuesday, WE GOT CUT OFF(CELL PHONE CUT OUT).  Misty [name] NT [phone]  11:56AM

## 2011-07-13 ENCOUNTER — Inpatient Hospital Stay (HOSPITAL_COMMUNITY)
Admission: AD | Admit: 2011-07-13 | Discharge: 2011-07-13 | Disposition: A | Discharge status: Home / Self Care | Payer: Medicaid Other | Source: Ambulatory Visit | Attending: Obstetrics & Gynecology | Admitting: Obstetrics & Gynecology

## 2011-07-13 ENCOUNTER — Encounter (HOSPITAL_COMMUNITY): Payer: Self-pay

## 2011-07-13 DIAGNOSIS — O99891 Other specified diseases and conditions complicating pregnancy: Secondary | ICD-10-CM | POA: Insufficient documentation

## 2011-07-13 DIAGNOSIS — N949 Unspecified condition associated with female genital organs and menstrual cycle: Secondary | ICD-10-CM

## 2011-07-13 DIAGNOSIS — R109 Unspecified abdominal pain: Secondary | ICD-10-CM | POA: Insufficient documentation

## 2011-07-13 DIAGNOSIS — N644 Mastodynia: Secondary | ICD-10-CM

## 2011-07-13 LAB — URINE MICROSCOPIC-ADD ON

## 2011-07-13 LAB — URINALYSIS, ROUTINE W REFLEX MICROSCOPIC
Bilirubin Urine: NEGATIVE
Hgb urine dipstick: NEGATIVE
Ketones, ur: NEGATIVE mg/dL
Nitrite: NEGATIVE
Specific Gravity, Urine: 1.02 (ref 1.005–1.030)
Urobilinogen, UA: 1 mg/dL (ref 0.0–1.0)
pH: 7.5 (ref 5.0–8.0)

## 2011-07-13 NOTE — MAU Note (Signed)
Patient states has been having abdominal pain for 2-3 weeks. Has been having bilateral breast pain and upper back pain for 3 days. Has headaches Has an appointment with [facility] on 7-17

## 2011-07-13 NOTE — MAU Provider Note (Signed)
Vista Deck PierceYounger31 y.o.G1P0000 @[redacted]w[redacted]d  by LMP Chief Complaint  Patient presents with  . Abdominal Pain  . Breast Pain  . Back Pain   First Provider Initiated Contact with Patient 07/13/11 2229     SUBJECTIVE  HPI: HPI: Misty [name] is a 31 y.o. year old G10P0000 female at 71w5dweeks gestation who presents to MAU reporting low abd pain and upper back soreness x 3 weeks and breast tenderness since beginning of the pregnancy. Denies urinary Sx, GI Sx, VB. LOF, vaginal discharge. Has been evaluated for similar Sx a few weeks ago. Dx w/ round ligament pains. [facility] Has taken Tylen for the pain w/ Some improvement. States she just wants to make sure the baby is OK.   Past Medical History  Diagnosis Date  . PCOD (polycystic ovarian disease)     [facility]  . Chest pain     multiple ED evaluations, most likely musculoskeletal (cervical radiculopathy), needs MRI spine  . GERD (gastroesophageal reflux disease)   . Diabetes mellitus     pt states doesn't know if has DM   Past Surgical History  Procedure Date  . No past surgeries    History   Social History  . Marital Status: Single    Spouse Name: N/A    Number of Children: N/A  . Years of Education: N/A   Occupational History  . Not on file.   Social History Main Topics  . Smoking status: Current Everyday Smoker -- 0.5 packs/day    Types: Cigarettes  . Smokeless tobacco: Not on file  . Alcohol Use: No  . Drug Use: No  . Sexually Active: Yes   Other Topics Concern  . Not on file   Social History Narrative  . No narrative on file   No current facility-administered medications on file prior to encounter.   Current Outpatient Prescriptions on File Prior to Encounter  Medication Sig Dispense Refill  . acetaminophen (TYLENOL) 500 MG tablet Take 500 mg by mouth every 6 (six) hours as needed. For pain      . Prenatal Vit-Fe Fumarate-FA (PRENATAL  MULTIVITAMIN) TABS Take 1 tablet by mouth daily.  30 tablet  11   No Known Allergies  ROS: Pertinent items in HPI  OBJECTIVE Blood pressure 129/51, pulse 89, temperature 99.3 F (37.4 C), resp. rate 20, height 5' 11.5" (1.816 m), weight 123.016 kg (271 lb 3.2 oz), last menstrual period 03/02/2011, SpO2 100.00%.  GENERAL: Well-developed, well-nourished female in no acute distress.  HEENT: Normocephalic, good dentition HEART: normal rate BREAST: No masses, erythema, dimpling of skin, or nipple discharge.   RESP: normal effort ABDOMEN: Soft, nontender EXTREMITIES: Nontender, no edema NEURO: Alert and oriented SPECULUM EXAM: deferred BIMANUAL: cervix closed and long; UTA uterus; no adnexal tenderness or masses FHR 149 per doppler  LAB RESULTS  Results for orders placed during the hospital encounter of 07/13/11 (from the past 24 hour(s))  URINALYSIS, ROUTINE W REFLEX MICROSCOPIC     Status: Abnormal   Collection Time   07/13/11  8:05 PM      Component Value Range   Color, Urine YELLOW  YELLOW   APPearance CLOUDY (*) CLEAR   Specific Gravity, Urine 1.020  1.005 - 1.030   pH 7.5  5.0 - 8.0   Glucose, UA NEGATIVE  NEGATIVE mg/dL   Hgb urine dipstick NEGATIVE  NEGATIVE   Bilirubin Urine NEGATIVE  NEGATIVE   Ketones, ur NEGATIVE  NEGATIVE mg/dL   Protein, ur NEGATIVE  NEGATIVE mg/dL   Urobilinogen, UA 1.0  0.0 - 1.0 mg/dL   Nitrite NEGATIVE  NEGATIVE   Leukocytes, UA TRACE (*) NEGATIVE  URINE MICROSCOPIC-ADD ON     Status: Abnormal   Collection Time   07/13/11  8:05 PM      Component Value Range   Squamous Epithelial / LPF FEW (*) RARE   WBC, UA 3-6  <3 WBC/hpf   Bacteria, UA FEW (*) RARE    IMAGING NA  ASSESSMENT  1. Round ligament pain   2. Breast pain in pregnancy     PLAN D/C home Comfort measures Follow-up Information    Follow up with [facility]. (as scheduled)    Contact information:   [address]       Follow up with [facility]. (As needed if symptoms worsen)    Contact information:   Noma Curran [phone]        Medication List  As of 07/13/2011 10:54 PM   TAKE these medications         acetaminophen 500 MG tablet   Commonly known as: TYLENOL   Take 500 mg by mouth every 6 (six) hours as needed. For pain      prenatal multivitamin Tabs   Take 1 tablet by mouth daily.           Misty [name] 07/13/2011 10:54 PM

## 2011-07-19 ENCOUNTER — Other Ambulatory Visit: Payer: Self-pay

## 2011-07-19 LAB — OB RESULTS CONSOLE HEPATITIS B SURFACE ANTIGEN: Hepatitis B Surface Ag: NEGATIVE

## 2011-07-19 LAB — OB RESULTS CONSOLE ABO/RH

## 2011-08-04 ENCOUNTER — Encounter: Payer: Medicaid Other | Admitting: Obstetrics and Gynecology

## 2011-08-09 ENCOUNTER — Other Ambulatory Visit: Payer: Self-pay | Admitting: Nurse Practitioner

## 2011-08-09 DIAGNOSIS — O24419 Gestational diabetes mellitus in pregnancy, unspecified control: Secondary | ICD-10-CM

## 2011-08-17 ENCOUNTER — Encounter (HOSPITAL_COMMUNITY): Payer: Self-pay

## 2011-08-17 ENCOUNTER — Ambulatory Visit (HOSPITAL_COMMUNITY)
Admission: RE | Admit: 2011-08-17 | Discharge: 2011-08-17 | Disposition: A | Discharge status: Home / Self Care | Payer: Medicaid Other | Source: Ambulatory Visit | Attending: Nurse Practitioner | Admitting: Nurse Practitioner

## 2011-08-17 DIAGNOSIS — E282 Polycystic ovarian syndrome: Secondary | ICD-10-CM

## 2011-08-17 DIAGNOSIS — E119 Type 2 diabetes mellitus without complications: Secondary | ICD-10-CM

## 2011-08-17 DIAGNOSIS — Z1389 Encounter for screening for other disorder: Secondary | ICD-10-CM | POA: Insufficient documentation

## 2011-08-17 DIAGNOSIS — O358XX0 Maternal care for other (suspected) fetal abnormality and damage, not applicable or unspecified: Secondary | ICD-10-CM | POA: Insufficient documentation

## 2011-08-17 DIAGNOSIS — E669 Obesity, unspecified: Secondary | ICD-10-CM | POA: Insufficient documentation

## 2011-08-17 DIAGNOSIS — O99210 Obesity complicating pregnancy, unspecified trimester: Secondary | ICD-10-CM | POA: Insufficient documentation

## 2011-08-17 DIAGNOSIS — O99330 Smoking (tobacco) complicating pregnancy, unspecified trimester: Secondary | ICD-10-CM | POA: Insufficient documentation

## 2011-08-17 DIAGNOSIS — Z363 Encounter for antenatal screening for malformations: Secondary | ICD-10-CM | POA: Insufficient documentation

## 2011-08-17 DIAGNOSIS — O24419 Gestational diabetes mellitus in pregnancy, unspecified control: Secondary | ICD-10-CM

## 2011-08-17 DIAGNOSIS — O24919 Unspecified diabetes mellitus in pregnancy, unspecified trimester: Secondary | ICD-10-CM | POA: Insufficient documentation

## 2011-08-17 DIAGNOSIS — Z3400 Encounter for supervision of normal first pregnancy, unspecified trimester: Secondary | ICD-10-CM

## 2011-08-17 NOTE — Progress Notes (Signed)
MATERNAL FETAL MEDICINE CONSULT  Patient Name: Misty [name] Medical Record Number:  [id number] Date of Birth: 01-May-1980 Requesting Physician Name:  Kennon Holter, NP Date of Service: 08/17/2011  Chief Complaint Gestational diabetes mellitus  History of Present Illness [name] was seen today for prenatal diagnosis secondary to gestational diabetes mellitus at the request of Kennon Holter, NP.  The patient is a 31 y.o. G1P0000,at 75w5dwith an EDD of 12/23/2011, by Ultrasound dating method.  Ms. PWolfgang Phoenixreports that she was tested prior to pregnancy and told her test was "borderline", but does not report having a clear diagnosis of diabetes mellitus prior to pregnancy.  She does have a history of PCOS.  A recent hemoglobin A1C was 6.6%.  She reports no other complications in this pregnancy.  Review of Systems Pertinent items are noted in HPI.  Patient History OB History    Grav Para Term Preterm Abortions TAB SAB Ect Mult Living   1 0 0 0 0 0 0 0 0 0      # Outc Date GA Lbr Len/2nd Wgt Sex Del Anes PTL Lv   1 CUR               Past Medical History  Diagnosis Date  . PCOD (polycystic ovarian disease)     [facility]  . Chest pain     multiple ED evaluations, most likely musculoskeletal (cervical radiculopathy), needs MRI spine  . GERD (gastroesophageal reflux disease)   . Diabetes mellitus     pt states doesn't know if has DM    Past Surgical History  Procedure Date  . No past surgeries     History   Social History  . Marital Status: Single    Spouse Name: N/A    Number of Children: N/A  . Years of Education: N/A   Social History Main Topics  . Smoking status: Current Everyday Smoker -- 0.5 packs/day    Types: Cigarettes  . Smokeless tobacco: None  . Alcohol Use: No  . Drug Use: No  . Sexually Active: Yes   Other Topics Concern  . None   Social History Narrative  . None    Family  History  Problem Relation Age of Onset  . Diabetes Mother   . Hypertension Mother   . Heart disease Father   . Diabetes Sister   . Hypertension Sister   . Diabetes Brother   . Cancer Maternal Grandmother   . Cancer Paternal Grandmother    In addition, the patient has no family history of mental retardation, birth defects, or genetic diseases.  Physical Examination Vitals:  Pulse 95, BP 114/67, Weight 281 lbs. General appearance - alert, well appearing, and in no distress Abdomen - soft, nontender, nondistended, no masses or organomegaly Extremities - peripheral pulses normal, no pedal edema, no clubbing or cyanosis  Assessment and Recommendations 1.  Gestational Diabetes Mellitus.  Although she does not meet the strict criteria for gestational diabetes, i.e. she has not had a 3 hour glucose tolerance test, the patient's clinical picture is consistent with diabetes.  Given her history of PCOS and the evidence of hyperglycemia in early in this pregnancy raise concern that the patient in fact has pre-gestational diabetes.  This cannot be confirmed until a 2 hour, 75 gram glucose tolerance test can be performed after pregnancy.  Given the high likelihood of GDM it is reasonable to proceed with glucose testing without performing the confirmatory 3  hour glucose tolerance test.  [name] has already met with a diabetic educator for glucose testing education and dietary counseling.  She will pick up her meter and supplies today and begin testing.  The goals of therapy include fasting blood sugars less than 90 mg/dl and 2-hour postprandial less than 120 mg/dl with avoidance of hypoglycemia.  She will attempt to control her blood sugar with diet therapy alone for two weeks.  If diet therapy is not successful in acheiving adequate glycemic control, she will require medical therapy.  I recommend first starting glyburide 2.5 mg orally with breakfast and increasing as needed to acheive desired  glycemic goals.  If the maximum daily dose of glyburide is reached (10 mg bid) and glycemic control remains inadequate, the patient should be stared on insulin.  If the patient requires medical treatment of her diabetes then fetal surveillance should be instituted  with serial growth ultrasounds every 4 weeks and twice weekly antepartum testing beginning at 32 weeks.  [name] will return in 2 weeks at which time we will review her glucose log to determine if medication is needed for adequate glycemic control.  Jolyn Lent, MD

## 2011-08-17 NOTE — Progress Notes (Signed)
[name] was seen for ultrasound appointment today.  Please see AS-OBGYN report for details.

## 2011-08-28 ENCOUNTER — Other Ambulatory Visit: Payer: Self-pay

## 2011-08-31 ENCOUNTER — Encounter (HOSPITAL_COMMUNITY): Payer: Self-pay

## 2011-08-31 ENCOUNTER — Ambulatory Visit (HOSPITAL_COMMUNITY)
Admission: RE | Admit: 2011-08-31 | Discharge: 2011-08-31 | Disposition: A | Discharge status: Home / Self Care | Payer: Medicaid Other | Source: Ambulatory Visit | Attending: Nurse Practitioner | Admitting: Nurse Practitioner

## 2011-09-05 ENCOUNTER — Other Ambulatory Visit: Payer: Self-pay | Admitting: Nurse Practitioner

## 2011-09-05 DIAGNOSIS — O24919 Unspecified diabetes mellitus in pregnancy, unspecified trimester: Secondary | ICD-10-CM

## 2011-09-07 ENCOUNTER — Ambulatory Visit (HOSPITAL_COMMUNITY)
Admission: RE | Admit: 2011-09-07 | Discharge: 2011-09-07 | Disposition: A | Discharge status: Home / Self Care | Payer: Medicaid Other | Source: Ambulatory Visit | Attending: Nurse Practitioner | Admitting: Nurse Practitioner

## 2011-09-07 ENCOUNTER — Ambulatory Visit (HOSPITAL_COMMUNITY): Payer: Medicaid Other

## 2011-09-07 ENCOUNTER — Other Ambulatory Visit: Payer: Self-pay

## 2011-09-07 ENCOUNTER — Encounter: Payer: Medicaid Other | Attending: Obstetrics | Admitting: Dietician

## 2011-09-07 DIAGNOSIS — O24919 Unspecified diabetes mellitus in pregnancy, unspecified trimester: Secondary | ICD-10-CM | POA: Insufficient documentation

## 2011-09-07 DIAGNOSIS — O99330 Smoking (tobacco) complicating pregnancy, unspecified trimester: Secondary | ICD-10-CM | POA: Insufficient documentation

## 2011-09-07 DIAGNOSIS — O99810 Abnormal glucose complicating pregnancy: Secondary | ICD-10-CM | POA: Insufficient documentation

## 2011-09-07 DIAGNOSIS — E669 Obesity, unspecified: Secondary | ICD-10-CM | POA: Insufficient documentation

## 2011-09-07 DIAGNOSIS — Z713 Dietary counseling and surveillance: Secondary | ICD-10-CM | POA: Insufficient documentation

## 2011-09-07 DIAGNOSIS — E119 Type 2 diabetes mellitus without complications: Secondary | ICD-10-CM

## 2011-09-07 NOTE — Progress Notes (Signed)
[name] had an ultrasound appointment today.  Please see AS-OB/GYN report for details.  Comments There is an active singleton fetus with no apparent dysmorphic features on today's routine anatomic re-examination.  The biometry suggests a fetus with an EFW at the approximately 43rd percentile for gestational age.    Impression Active singleton fetus. Normal interval growth. Normal amniotic fluid volume   Recommendations 1. Repeat interval growth assessment by ultrasound was scheduled for your patient in 4 weeks. 2. Follow as clinically indicated.  Marijean Bravo, MD, MS, FACOG Assistant Professor Section of [place]

## 2011-09-07 NOTE — ED Notes (Signed)
09/07/2011  Diabetes Education:  This G67P0 lady is seen today for GDM counseling.  Ht: 73 in WT: 277 lb.Currently at 24 weeks and EDD at 12/23/2011.  Has strong family history of diabetes.  Currently on prenatal vitamins and Glyburide 2.5 mg at HS.  Currently has a glucose miter and has been testing her blood glucose fasting and 2 hr post meals.  Since starting the Glyburide, her fastin levels are 98,92, 67-75 mg.  Two hours post BK=167-153-135-73,88 mg..  Two hr post Lunch=102-133 mg. Two hours post Dinner=99-119-128.  Today, we reviewed the prescribed diet for GDM, label reading, exchange list, counting carbohydrates.  A carb restricted menu was provided.  She has my card and is to call me with questions.  Maggie May, RN, CDE.

## 2011-09-19 NOTE — Progress Notes (Signed)
MFM Note  This is a return visit for Misty [name] who has been recently diagnosed with gestational diabetes. She presents with about 2 weeks of blood sugars. Most all are above the targeted goal. She was started on glyburide 2.5 mgs bid. She is to call in her sugars in one week. Hypoglycemia symptoms and treatment were reviewed.  (Face-to-face consultation with patient:15 min)

## 2011-10-05 ENCOUNTER — Ambulatory Visit (HOSPITAL_COMMUNITY)
Admission: RE | Admit: 2011-10-05 | Discharge: 2011-10-05 | Disposition: A | Discharge status: Home / Self Care | Payer: Medicaid Other | Source: Ambulatory Visit | Attending: Nurse Practitioner | Admitting: Nurse Practitioner

## 2011-10-05 VITALS — BP 111/61 | HR 104 | Wt 278.5 lb

## 2011-10-05 DIAGNOSIS — E119 Type 2 diabetes mellitus without complications: Secondary | ICD-10-CM

## 2011-10-05 DIAGNOSIS — O99330 Smoking (tobacco) complicating pregnancy, unspecified trimester: Secondary | ICD-10-CM | POA: Insufficient documentation

## 2011-10-05 DIAGNOSIS — O99210 Obesity complicating pregnancy, unspecified trimester: Secondary | ICD-10-CM | POA: Insufficient documentation

## 2011-10-05 DIAGNOSIS — O24919 Unspecified diabetes mellitus in pregnancy, unspecified trimester: Secondary | ICD-10-CM | POA: Insufficient documentation

## 2011-10-05 DIAGNOSIS — E669 Obesity, unspecified: Secondary | ICD-10-CM | POA: Insufficient documentation

## 2011-10-05 NOTE — Progress Notes (Signed)
[facility] ultrasound  Indication: 31 yr old G1P0 at 23w5dwith likely pregestational diabetes for fetal growth ultrasound.  Findings:  1. Estimated fetal weight is in the 48th%. 2. Posterior placenta without evidence of previa. 3. Normal amniotic fluid index. 4. The limited anatomy survey is normal.  Recommendations: 1. Appropriate fetal growth. 2. Diabetes: patient currently reportedly well controlled on glyburide. Did not have blood sugars today. Ran out of medication and blood sugars have been elevated over last 3 days, but otherwise have been well controlled. Patient has OB appointment today and will get refill of medication. Recommend fetal growth ultrasound in 4 weeks. Recommend start antenatal testing at [redacted] weeks gestation. 3. Recommend start fetal kick counts- instructions given.  [place] MD

## 2011-10-30 ENCOUNTER — Other Ambulatory Visit: Payer: Self-pay | Admitting: Obstetrics & Gynecology

## 2011-10-30 DIAGNOSIS — O99340 Other mental disorders complicating pregnancy, unspecified trimester: Secondary | ICD-10-CM

## 2011-10-30 DIAGNOSIS — O24919 Unspecified diabetes mellitus in pregnancy, unspecified trimester: Secondary | ICD-10-CM

## 2011-11-02 ENCOUNTER — Ambulatory Visit (HOSPITAL_COMMUNITY)
Admission: RE | Admit: 2011-11-02 | Discharge: 2011-11-02 | Disposition: A | Discharge status: Home / Self Care | Payer: Medicaid Other | Source: Ambulatory Visit | Attending: Obstetrics & Gynecology | Admitting: Obstetrics & Gynecology

## 2011-11-02 ENCOUNTER — Other Ambulatory Visit: Payer: Self-pay | Admitting: Obstetrics & Gynecology

## 2011-11-02 VITALS — BP 133/64 | HR 113 | Wt 281.0 lb

## 2011-11-02 DIAGNOSIS — O99340 Other mental disorders complicating pregnancy, unspecified trimester: Secondary | ICD-10-CM

## 2011-11-02 DIAGNOSIS — E669 Obesity, unspecified: Secondary | ICD-10-CM | POA: Insufficient documentation

## 2011-11-02 DIAGNOSIS — O24919 Unspecified diabetes mellitus in pregnancy, unspecified trimester: Secondary | ICD-10-CM

## 2011-11-02 DIAGNOSIS — O99330 Smoking (tobacco) complicating pregnancy, unspecified trimester: Secondary | ICD-10-CM | POA: Insufficient documentation

## 2011-11-02 DIAGNOSIS — O99210 Obesity complicating pregnancy, unspecified trimester: Secondary | ICD-10-CM | POA: Insufficient documentation

## 2011-11-09 ENCOUNTER — Ambulatory Visit (HOSPITAL_COMMUNITY)
Admission: RE | Admit: 2011-11-09 | Discharge: 2011-11-09 | Disposition: A | Discharge status: Home / Self Care | Payer: Medicaid Other | Source: Ambulatory Visit | Attending: Obstetrics & Gynecology | Admitting: Obstetrics & Gynecology

## 2011-11-09 VITALS — BP 130/62 | HR 115 | Wt 282.0 lb

## 2011-11-09 DIAGNOSIS — O24919 Unspecified diabetes mellitus in pregnancy, unspecified trimester: Secondary | ICD-10-CM

## 2011-11-09 DIAGNOSIS — O99330 Smoking (tobacco) complicating pregnancy, unspecified trimester: Secondary | ICD-10-CM | POA: Insufficient documentation

## 2011-11-09 DIAGNOSIS — E669 Obesity, unspecified: Secondary | ICD-10-CM | POA: Insufficient documentation

## 2011-11-09 NOTE — Progress Notes (Signed)
Misty [name]  was seen today for an ultrasound appointment.  See full report in [facility]-OB/GYN.  Single IUP at 33 5/7 weeks Limited ultrasound for AFI only Normal amniotic fluid index (AFI= 16.5 cm) Reactive NST - normal modified BPP  Continue 2x weekly antepartum fetal testing.  Benjaman Lobe, MD

## 2011-11-10 ENCOUNTER — Ambulatory Visit (HOSPITAL_COMMUNITY): Payer: Medicaid Other

## 2011-11-16 ENCOUNTER — Ambulatory Visit (HOSPITAL_COMMUNITY)
Admission: RE | Admit: 2011-11-16 | Discharge: 2011-11-16 | Disposition: A | Discharge status: Home / Self Care | Payer: Medicaid Other | Source: Ambulatory Visit | Attending: Obstetrics & Gynecology | Admitting: Obstetrics & Gynecology

## 2011-11-16 ENCOUNTER — Other Ambulatory Visit (HOSPITAL_COMMUNITY): Payer: Self-pay | Admitting: Maternal and Fetal Medicine

## 2011-11-16 VITALS — BP 122/70 | HR 93 | Wt 284.5 lb

## 2011-11-16 DIAGNOSIS — O289 Unspecified abnormal findings on antenatal screening of mother: Secondary | ICD-10-CM | POA: Insufficient documentation

## 2011-11-16 DIAGNOSIS — O24919 Unspecified diabetes mellitus in pregnancy, unspecified trimester: Secondary | ICD-10-CM

## 2011-11-16 DIAGNOSIS — O99330 Smoking (tobacco) complicating pregnancy, unspecified trimester: Secondary | ICD-10-CM | POA: Insufficient documentation

## 2011-11-16 DIAGNOSIS — E669 Obesity, unspecified: Secondary | ICD-10-CM | POA: Insufficient documentation

## 2011-11-16 NOTE — ED Notes (Signed)
Pt did not bring blood sugars with her today.

## 2011-11-16 NOTE — Progress Notes (Signed)
MFM addendum  NST nonreactive; normal baseline; no decelerations BPP done and was 8/10.  Continue antenatal testing.  [place], MD

## 2011-11-16 NOTE — Progress Notes (Signed)
[facility] ultrasound  Indication: 31 yr old G1P0 at 52w5dwith likely pregestational diabetes for amniotic fluid index.  Findings:  1. Single intrauterine pregnancy. 2. Posterior placenta without evidence of previa. 3. Normal amniotic fluid index. 4. Normal fetal heart rate.  Recommendations: 1. Normal AFI. 2. Diabetes: patient currently reportedly well controlled on glyburide.  Recommend fetal growth ultrasound in 2 weeks. Recommend continue antenatal testing. 3. Recommend continue fetal kick counts. 4. Follow up in 1 week.  [place] MD

## 2011-11-20 LAB — OB RESULTS CONSOLE GBS: GBS: NEGATIVE

## 2011-11-23 ENCOUNTER — Ambulatory Visit (HOSPITAL_COMMUNITY)
Admission: RE | Admit: 2011-11-23 | Discharge: 2011-11-23 | Disposition: A | Discharge status: Home / Self Care | Payer: Medicaid Other | Source: Ambulatory Visit | Attending: Obstetrics & Gynecology | Admitting: Obstetrics & Gynecology

## 2011-11-23 ENCOUNTER — Other Ambulatory Visit (HOSPITAL_COMMUNITY): Payer: Self-pay | Admitting: Maternal and Fetal Medicine

## 2011-11-23 VITALS — BP 127/63 | HR 93 | Wt 286.5 lb

## 2011-11-23 DIAGNOSIS — O24919 Unspecified diabetes mellitus in pregnancy, unspecified trimester: Secondary | ICD-10-CM

## 2011-11-23 DIAGNOSIS — O99330 Smoking (tobacco) complicating pregnancy, unspecified trimester: Secondary | ICD-10-CM | POA: Insufficient documentation

## 2011-11-23 DIAGNOSIS — E669 Obesity, unspecified: Secondary | ICD-10-CM | POA: Insufficient documentation

## 2011-11-23 DIAGNOSIS — O99210 Obesity complicating pregnancy, unspecified trimester: Secondary | ICD-10-CM | POA: Insufficient documentation

## 2011-11-23 NOTE — Progress Notes (Signed)
[facility]  Indication: 31 yr old G1P0 at 27w5dwith likely pregestational diabetes for amniotic fluid index and biophysical profile secondary to nonreactive nonstress test.  Findings:  1. Single intrauterine pregnancy. 2. Posterior placenta without evidence of previa. 3. Normal amniotic fluid index. 4. Normal fetal heart rate. 5. Normal biophysical profile of 8/8.  Recommendations: 1. Normal AFI. 2. Diabetes: patient currently reportedly well controlled on glyburide- although ran out of her medication this week- will fill it today. Discussed if this happens in the future to call her physician asap.  Recommend fetal growth ultrasound next week. Recommend continue antenatal testing. BPP 8/10 today. 3. Recommend continue fetal kick counts. 4. Follow up in 1 week.  [place] MD

## 2011-11-29 ENCOUNTER — Ambulatory Visit (HOSPITAL_COMMUNITY)
Admission: RE | Admit: 2011-11-29 | Discharge: 2011-11-29 | Disposition: A | Discharge status: Home / Self Care | Payer: Medicaid Other | Source: Ambulatory Visit | Attending: Obstetrics & Gynecology | Admitting: Obstetrics & Gynecology

## 2011-11-29 ENCOUNTER — Other Ambulatory Visit (HOSPITAL_COMMUNITY): Payer: Self-pay | Admitting: Obstetrics and Gynecology

## 2011-11-29 DIAGNOSIS — E669 Obesity, unspecified: Secondary | ICD-10-CM | POA: Insufficient documentation

## 2011-11-29 DIAGNOSIS — O24919 Unspecified diabetes mellitus in pregnancy, unspecified trimester: Secondary | ICD-10-CM

## 2011-11-29 DIAGNOSIS — O289 Unspecified abnormal findings on antenatal screening of mother: Secondary | ICD-10-CM | POA: Insufficient documentation

## 2011-11-29 DIAGNOSIS — O99330 Smoking (tobacco) complicating pregnancy, unspecified trimester: Secondary | ICD-10-CM | POA: Insufficient documentation

## 2011-11-29 NOTE — Progress Notes (Signed)
Vista Deck Hakes  was seen today for an ultrasound appointment.  See full report in AS-OB/GYN.  Impression: Single IUP at 36 4/7 weeks Interval growth is appropriate (67th %tile) Normal amniotic fluid volume BPP 8/10 (-2 for non reactive NST)  Recommendations: Contiune 2x weekly antepartum fetal testing. Recommend induction of labor at 39 weeks if undelivered due to pregestational diabetes  Benjaman Lobe, MD

## 2011-12-03 ENCOUNTER — Encounter (HOSPITAL_COMMUNITY): Payer: Self-pay | Admitting: *Deleted

## 2011-12-03 ENCOUNTER — Inpatient Hospital Stay (HOSPITAL_COMMUNITY)
Admission: AD | Admit: 2011-12-03 | Discharge: 2011-12-03 | Disposition: A | Discharge status: Home / Self Care | Payer: Medicaid Other | Source: Ambulatory Visit | Attending: Obstetrics | Admitting: Obstetrics

## 2011-12-03 DIAGNOSIS — N949 Unspecified condition associated with female genital organs and menstrual cycle: Secondary | ICD-10-CM | POA: Insufficient documentation

## 2011-12-03 DIAGNOSIS — M545 Low back pain, unspecified: Secondary | ICD-10-CM | POA: Insufficient documentation

## 2011-12-03 DIAGNOSIS — O99891 Other specified diseases and conditions complicating pregnancy: Secondary | ICD-10-CM | POA: Insufficient documentation

## 2011-12-03 HISTORY — DX: Gestational diabetes mellitus in pregnancy, unspecified control: O24.419

## 2011-12-03 NOTE — MAU Note (Signed)
Pt presents with complaint of vaginal pressure and pressure in lower back, spotting for last 3 days.. Denies problems with pregnancy.

## 2011-12-04 ENCOUNTER — Ambulatory Visit (HOSPITAL_COMMUNITY): Payer: Medicaid Other

## 2011-12-06 ENCOUNTER — Other Ambulatory Visit: Payer: Self-pay | Admitting: Obstetrics

## 2011-12-06 DIAGNOSIS — O24919 Unspecified diabetes mellitus in pregnancy, unspecified trimester: Secondary | ICD-10-CM

## 2011-12-07 ENCOUNTER — Ambulatory Visit (HOSPITAL_COMMUNITY): Payer: Medicaid Other

## 2011-12-08 ENCOUNTER — Other Ambulatory Visit: Payer: Self-pay | Admitting: Obstetrics

## 2011-12-08 ENCOUNTER — Ambulatory Visit (HOSPITAL_COMMUNITY): Admission: RE | Admit: 2011-12-08 | Payer: Medicaid Other | Source: Ambulatory Visit

## 2011-12-08 ENCOUNTER — Ambulatory Visit (HOSPITAL_COMMUNITY)
Admission: RE | Admit: 2011-12-08 | Discharge: 2011-12-08 | Disposition: A | Discharge status: Home / Self Care | Payer: Medicaid Other | Source: Ambulatory Visit | Attending: Obstetrics | Admitting: Obstetrics

## 2011-12-08 DIAGNOSIS — O99330 Smoking (tobacco) complicating pregnancy, unspecified trimester: Secondary | ICD-10-CM | POA: Insufficient documentation

## 2011-12-08 DIAGNOSIS — O24919 Unspecified diabetes mellitus in pregnancy, unspecified trimester: Secondary | ICD-10-CM | POA: Insufficient documentation

## 2011-12-08 DIAGNOSIS — E669 Obesity, unspecified: Secondary | ICD-10-CM | POA: Insufficient documentation

## 2011-12-08 NOTE — Progress Notes (Signed)
Misty [name] was seen for ultrasound appointment today.  Please see AS-OBGYN report for details.

## 2011-12-11 ENCOUNTER — Encounter (HOSPITAL_COMMUNITY): Payer: Self-pay | Admitting: *Deleted

## 2011-12-11 ENCOUNTER — Inpatient Hospital Stay (HOSPITAL_COMMUNITY)
Admission: AD | Admit: 2011-12-11 | Discharge: 2011-12-16 | DRG: 766 | Disposition: A | Discharge status: Home / Self Care | Payer: Medicaid Other | Source: Ambulatory Visit | Attending: Obstetrics & Gynecology | Admitting: Obstetrics & Gynecology

## 2011-12-11 DIAGNOSIS — O24319 Unspecified pre-existing diabetes mellitus in pregnancy, unspecified trimester: Secondary | ICD-10-CM | POA: Diagnosis present

## 2011-12-11 DIAGNOSIS — O99814 Abnormal glucose complicating childbirth: Secondary | ICD-10-CM | POA: Diagnosis present

## 2011-12-11 DIAGNOSIS — IMO0002 Reserved for concepts with insufficient information to code with codable children: Principal | ICD-10-CM | POA: Diagnosis present

## 2011-12-11 DIAGNOSIS — O324XX0 Maternal care for high head at term, not applicable or unspecified: Secondary | ICD-10-CM | POA: Diagnosis present

## 2011-12-11 LAB — COMPREHENSIVE METABOLIC PANEL
Alkaline Phosphatase: 123 U/L — ABNORMAL HIGH (ref 39–117)
BUN: 5 mg/dL — ABNORMAL LOW (ref 6–23)
Chloride: 107 mEq/L (ref 96–112)
GFR calc Af Amer: >90 mL/min (ref >90)
GFR calc non Af Amer: >90 mL/min (ref >90)
Glucose, Bld: 111 mg/dL — ABNORMAL HIGH (ref 70–99)
Potassium: 3.7 mEq/L (ref 3.5–5.1)
Total Bilirubin: 0.2 mg/dL — ABNORMAL LOW (ref 0.3–1.2)

## 2011-12-11 LAB — CBC
HCT: 33.6 % — ABNORMAL LOW (ref 36.0–46.0)
MCV: 83.8 fL (ref 78.0–100.0)
RBC: 4.01 MIL/uL (ref 3.87–5.11)
WBC: 14.6 10*3/uL — ABNORMAL HIGH (ref 4.0–10.5)

## 2011-12-11 LAB — LACTATE DEHYDROGENASE: LDH: 144 U/L (ref 94–250)

## 2011-12-11 MED ORDER — IBUPROFEN 600 MG PO TABS: 600.0000 mg | ORAL_TABLET | Freq: Four times a day (QID) | ORAL | Status: DC | PRN

## 2011-12-11 MED ORDER — OXYCODONE-ACETAMINOPHEN 5-325 MG PO TABS: 1.0000 | ORAL_TABLET | ORAL | Status: DC | PRN

## 2011-12-11 MED ORDER — ACETAMINOPHEN 325 MG PO TABS
650.0000 mg | ORAL_TABLET | ORAL | Status: DC | PRN
  Administered 2011-12-13 (×2): 650 mg via ORAL
  Filled 2011-12-11: qty 2
  Filled 2011-12-11: qty 1

## 2011-12-11 MED ORDER — LACTATED RINGERS IV SOLN
500.0000 mL | INTRAVENOUS | Status: DC | PRN
  Administered 2011-12-13: 250 mL via INTRAVENOUS
  Administered 2011-12-13: 500 mL via INTRAVENOUS

## 2011-12-11 MED ORDER — GLYBURIDE 5 MG PO TABS
5.0000 mg | ORAL_TABLET | Freq: Two times a day (BID) | ORAL | Status: DC
  Administered 2011-12-11: 5 mg via ORAL
  Filled 2011-12-11 (×5): qty 1

## 2011-12-11 MED ORDER — LACTATED RINGERS IV SOLN
INTRAVENOUS | Status: DC
  Administered 2011-12-11 – 2011-12-12 (×3): via INTRAVENOUS
  Administered 2011-12-13: 125 mL via INTRAVENOUS
  Administered 2011-12-13: 01:00:00 via INTRAVENOUS

## 2011-12-11 MED ORDER — OXYTOCIN 40 UNITS IN LACTATED RINGERS INFUSION - SIMPLE MED: 62.5000 mL/h | INTRAVENOUS | Status: DC

## 2011-12-11 MED ORDER — CITRIC ACID-SODIUM CITRATE 334-500 MG/5ML PO SOLN
30.0000 mL | ORAL | Status: DC | PRN
  Administered 2011-12-14: 30 mL via ORAL
  Filled 2011-12-11: qty 15

## 2011-12-11 MED ORDER — ONDANSETRON HCL 4 MG/2ML IJ SOLN: 4.0000 mg | Freq: Four times a day (QID) | INTRAMUSCULAR | Status: DC | PRN

## 2011-12-11 MED ORDER — TERBUTALINE SULFATE 1 MG/ML IJ SOLN: 0.2500 mg | Freq: Once | INTRAMUSCULAR | Status: AC | PRN

## 2011-12-11 MED ORDER — MISOPROSTOL 25 MCG QUARTER TABLET
25.0000 ug | ORAL_TABLET | ORAL | Status: DC | PRN
  Administered 2011-12-11 – 2011-12-12 (×3): 25 ug via VAGINAL
  Filled 2011-12-11 (×3): qty 0.25

## 2011-12-11 MED ORDER — LIDOCAINE HCL (PF) 1 % IJ SOLN
30.0000 mL | INTRAMUSCULAR | Status: DC | PRN
  Filled 2011-12-11: qty 30

## 2011-12-11 MED ORDER — ZOLPIDEM TARTRATE 5 MG PO TABS
5.0000 mg | ORAL_TABLET | Freq: Every evening | ORAL | Status: DC | PRN
  Administered 2011-12-11: 5 mg via ORAL
  Filled 2011-12-11: qty 1

## 2011-12-11 MED ORDER — FLEET ENEMA 7-19 GM/118ML RE ENEM: 1.0000 | ENEMA | Freq: Once | RECTAL | Status: DC

## 2011-12-11 MED ORDER — OXYTOCIN BOLUS FROM INFUSION: 500.0000 mL | INTRAVENOUS | Status: DC

## 2011-12-11 NOTE — H&P (Signed)
Misty [name] is a 31 y.o. female presenting for IOL. Maternal Medical History:  Reason for admission: She presented for a routine prenatal visit and was found to B/Ps in 140s/80-90.  She denied any neurological symptoms.  She reports the recent stress of having to care for an ailing family member.  By report she is not checking her CBGs.  A random CBG was 180.  Fetal activity: Perceived fetal activity is normal.    Prenatal Complications - Diabetes: type 2. Diabetes is managed by oral agent (monotherapy).      OB History    Grav Para Term Preterm Abortions TAB SAB Ect Mult Living   1 0 0 0 0 0 0 0 0 0      Past Medical History  Diagnosis Date  . PCOD (polycystic ovarian disease)     Followed by Misty [name], has been on metformin  . Chest pain     multiple ED evaluations, most likely musculoskeletal (cervical radiculopathy), needs MRI spine  . GERD (gastroesophageal reflux disease)   . Diabetes mellitus     pt states doesn't know if has DM  . Gestational diabetes    Past Surgical History  Procedure Date  . No past surgeries    Family History: family history includes Cancer in her maternal grandmother and paternal grandmother; Diabetes in her brother, mother, and sister; Heart disease in her father; and Hypertension in her mother and sister. Social History:  reports that she has been smoking Cigarettes.  She has been smoking about .5 packs per day. She does not have any smokeless tobacco history on file. She reports that she does not drink alcohol or use illicit drugs.     Review of Systems  Constitutional: Negative for fever.  Eyes: Negative for blurred vision.  Respiratory: Negative for shortness of breath.   Gastrointestinal: Negative for vomiting.  Skin: Negative for rash.  Neurological: Negative for headaches.      Height 6' 1"  (1.854 m), weight 131.543 kg (290 lb). Maternal Exam:  Abdomen: Patient reports no abdominal tenderness. Fetal presentation:  vertex  Introitus: Normal vulva. Cervix: Cervix evaluated by digital exam.     Fetal Exam Fetal Monitor Review: Variability: moderate (6-25 bpm).   Pattern: accelerations present and no decelerations.    Fetal State Assessment: Category I - tracings are normal.     Physical Exam  Constitutional: She appears well-developed.  HENT:  Head: Normocephalic.  Neck: Neck supple. No thyromegaly present.  Cardiovascular: Normal rate and regular rhythm.   Respiratory: Breath sounds normal.  GI: Soft. Bowel sounds are normal.  Skin: No rash noted.    Prenatal labs: ABO, Rh:   Antibody:   Rubella:   RPR:    HBsAg:    HIV: NON REACTIVE (05/01 0906)  GBS:   negative 11/18  Assessment/Plan: Primigravida with an IUP @ [redacted]w[redacted]d  Preeclampsia.  GDM controlled on an oral agent--?compliance.  A recent U/S documented an AGA fetus.  Unfavorable Bishop's score.  Admit PIH labs 2-stage IOL   Misty [name],Misty [name] A 12/11/2011, 2:10 PM

## 2011-12-11 NOTE — Progress Notes (Signed)
EFM adjusted

## 2011-12-11 NOTE — Progress Notes (Signed)
Back to bed, EFM reapplied after shower.

## 2011-12-11 NOTE — Progress Notes (Signed)
Monitors off for shower.

## 2011-12-12 LAB — GLUCOSE, CAPILLARY: Glucose-Capillary: 108 mg/dL — ABNORMAL HIGH (ref 70–99)

## 2011-12-12 MED ORDER — TERBUTALINE SULFATE 1 MG/ML IJ SOLN: 0.2500 mg | Freq: Once | INTRAMUSCULAR | Status: AC | PRN

## 2011-12-12 MED ORDER — NALBUPHINE SYRINGE 5 MG/0.5 ML
10.0000 mg | INJECTION | INTRAMUSCULAR | Status: DC | PRN
  Filled 2011-12-12: qty 1

## 2011-12-12 MED ORDER — PROMETHAZINE HCL 25 MG/ML IJ SOLN
25.0000 mg | Freq: Four times a day (QID) | INTRAMUSCULAR | Status: DC | PRN
  Administered 2011-12-12: 25 mg via INTRAMUSCULAR
  Filled 2011-12-12: qty 1

## 2011-12-12 MED ORDER — NALBUPHINE SYRINGE 5 MG/0.5 ML
10.0000 mg | INJECTION | INTRAMUSCULAR | Status: DC | PRN
  Administered 2011-12-12 – 2011-12-13 (×3): 10 mg via INTRAVENOUS
  Filled 2011-12-12 (×2): qty 1

## 2011-12-12 MED ORDER — NALBUPHINE HCL 10 MG/ML IJ SOLN: 10.0000 mg | INTRAMUSCULAR | Status: DC | PRN

## 2011-12-12 MED ORDER — NALBUPHINE SYRINGE 5 MG/0.5 ML
10.0000 mg | INJECTION | Freq: Four times a day (QID) | INTRAMUSCULAR | Status: DC | PRN
  Administered 2011-12-12: 10 mg via INTRAMUSCULAR
  Filled 2011-12-12 (×3): qty 1

## 2011-12-12 MED ORDER — OXYTOCIN 40 UNITS IN LACTATED RINGERS INFUSION - SIMPLE MED
1.0000 m[IU]/min | INTRAVENOUS | Status: DC
  Administered 2011-12-12: 1 m[IU]/min via INTRAVENOUS
  Filled 2011-12-12: qty 1000

## 2011-12-12 NOTE — Progress Notes (Signed)
JOZI MALACHI is a 31 y.o. G1P0000 at 60w3dby LMP admitted for induction of labor due to Gestational diabetes.  Subjective:   Objective: BP 149/82  Pulse 92  Temp 98.7 F (37.1 C) (Oral)  Resp 18  Ht 6' 1"  (1.854 m)  Wt 290 lb (131.543 kg)  BMI 38.26 kg/m2      FHT:  FHR: 150-160 bpm, variability: moderate,  accelerations:  Present,  decelerations:  Absent UC:   regular, every 2-3 minutes SVE:   Dilation: 1 Effacement (%): 80 Station: -2 Exam by:: k fields, rn SROM with light meconium. Labs: Lab Results  Component Value Date   WBC 14.6* 12/11/2011   HGB 10.8* 12/11/2011   HCT 33.6* 12/11/2011   MCV 83.8 12/11/2011   PLT 292 12/11/2011    Assessment / Plan: Induction of labor due to gestational diabetes,  progressing well on pitocin  Labor: Latent phase Preeclampsia:  n/a Fetal Wellbeing:  Category I Pain Control:  Nubain I/D:  n/a Anticipated MOD:  NSVD  Zanyia Silbaugh A 12/12/2011, 8:09 PM

## 2011-12-13 LAB — CBC
HCT: 35 % — ABNORMAL LOW (ref 36.0–46.0)
Hemoglobin: 11.6 g/dL — ABNORMAL LOW (ref 12.0–15.0)
MCH: 27.6 pg (ref 26.0–34.0)
MCV: 83.3 fL (ref 78.0–100.0)
RBC: 4.2 MIL/uL (ref 3.87–5.11)

## 2011-12-13 LAB — PREPARE RBC (CROSSMATCH)

## 2011-12-13 LAB — GLUCOSE, CAPILLARY
Glucose-Capillary: 117 mg/dL — ABNORMAL HIGH (ref 70–99)
Glucose-Capillary: 147 mg/dL — ABNORMAL HIGH (ref 70–99)

## 2011-12-13 MED ORDER — SODIUM CHLORIDE 0.9 % IV SOLN
2.0000 g | Freq: Four times a day (QID) | INTRAVENOUS | Status: DC
  Administered 2011-12-13 – 2011-12-15 (×7): 2 g via INTRAVENOUS
  Filled 2011-12-13 (×11): qty 2000

## 2011-12-13 MED ORDER — EPHEDRINE 5 MG/ML INJ
10.0000 mg | INTRAVENOUS | Status: DC | PRN
  Filled 2011-12-13: qty 4

## 2011-12-13 MED ORDER — GENTAMICIN SULFATE 40 MG/ML IJ SOLN
240.0000 mg | Freq: Three times a day (TID) | INTRAVENOUS | Status: DC
  Administered 2011-12-13 – 2011-12-15 (×5): 240 mg via INTRAVENOUS
  Filled 2011-12-13 (×8): qty 6

## 2011-12-13 MED ORDER — SODIUM BICARBONATE 8.4 % IV SOLN
INTRAVENOUS | Status: DC | PRN
  Administered 2011-12-13: 5 mL via EPIDURAL

## 2011-12-13 MED ORDER — LACTATED RINGERS IV SOLN: 500.0000 mL | Freq: Once | INTRAVENOUS | Status: DC

## 2011-12-13 MED ORDER — PHENYLEPHRINE 40 MCG/ML (10ML) SYRINGE FOR IV PUSH (FOR BLOOD PRESSURE SUPPORT): 80.0000 ug | PREFILLED_SYRINGE | INTRAVENOUS | Status: DC | PRN

## 2011-12-13 MED ORDER — LACTATED RINGERS IV SOLN
INTRAVENOUS | Status: DC
  Administered 2011-12-13: 20:00:00 via INTRAVENOUS

## 2011-12-13 MED ORDER — INSULIN ASPART 100 UNIT/ML ~~LOC~~ SOLN: 2.0000 [IU] | SUBCUTANEOUS | Status: DC

## 2011-12-13 MED ORDER — PHENYLEPHRINE 40 MCG/ML (10ML) SYRINGE FOR IV PUSH (FOR BLOOD PRESSURE SUPPORT)
80.0000 ug | PREFILLED_SYRINGE | INTRAVENOUS | Status: DC | PRN
  Filled 2011-12-13: qty 5

## 2011-12-13 MED ORDER — EPHEDRINE 5 MG/ML INJ: 10.0000 mg | INTRAVENOUS | Status: DC | PRN

## 2011-12-13 MED ORDER — OXYTOCIN 40 UNITS IN LACTATED RINGERS INFUSION - SIMPLE MED: 1.0000 m[IU]/min | INTRAVENOUS | Status: DC

## 2011-12-13 MED ORDER — DIPHENHYDRAMINE HCL 50 MG/ML IJ SOLN: 12.5000 mg | INTRAMUSCULAR | Status: DC | PRN

## 2011-12-13 MED ORDER — FENTANYL 2.5 MCG/ML BUPIVACAINE 1/10 % EPIDURAL INFUSION (WH - ANES)
14.0000 mL/h | INTRAMUSCULAR | Status: DC
  Administered 2011-12-13 – 2011-12-14 (×3): 14 mL/h via EPIDURAL
  Filled 2011-12-13 (×3): qty 125

## 2011-12-13 NOTE — Anesthesia Preprocedure Evaluation (Addendum)
Anesthesia Evaluation  Patient identified by MRN, date of birth, ID band Patient awake    Reviewed: Allergy & Precautions, H&P , NPO status , Patient's Chart, lab work & pertinent test results  Airway Mallampati: II TM Distance: >3 FB Neck ROM: full    Dental  (+) Teeth Intact and Dental Advisory Given   Pulmonary neg pulmonary ROS,  breath sounds clear to auscultation        Cardiovascular negative cardio ROS  Rhythm:regular Rate:Normal     Neuro/Psych negative neurological ROS  negative psych ROS   GI/Hepatic Neg liver ROS, GERD-  Medicated,  Endo/Other  diabetesMorbid obesity  Renal/GU negative Renal ROS     Musculoskeletal negative musculoskeletal ROS (+)   Abdominal   Peds  Hematology negative hematology ROS (+)   Anesthesia Other Findings       Reproductive/Obstetrics (+) Pregnancy                         Anesthesia Physical Anesthesia Plan  ASA: III and emergent  Anesthesia Plan: Epidural   Post-op Pain Management:    Induction:   Airway Management Planned:   Additional Equipment:   Intra-op Plan:   Post-operative Plan:   Informed Consent: I have reviewed the patients History and Physical, chart, labs and discussed the procedure including the risks, benefits and alternatives for the proposed anesthesia with the patient or authorized representative who has indicated his/her understanding and acceptance.   Dental Advisory Given  Plan Discussed with:   Anesthesia Plan Comments: (Labs checked- platelets confirmed with RN in room. Fetal heart tracing, per RN, reported to be stable enough for sitting procedure. Discussed epidural, and patient consents to the procedure:  included risk of possible headache,backache, failed block, allergic reaction, and nerve injury. This patient was asked if she had any questions or concerns before the procedure started. )        Anesthesia Quick Evaluation

## 2011-12-13 NOTE — Progress Notes (Signed)
ANTIBIOTIC CONSULT NOTE - INITIAL  Pharmacy Consult for Gentamicin Indication: Maternal temp/ suspected chorioamnionitis  No Known Allergies  Patient Measurements: Height: 6' 1"  (185.4 cm) Weight: 290 lb (131.543 kg) IBW/kg (Calculated) : 75.4  Adjusted Body Weight: 92.2kg  Vital Signs: Temp: 99.2 F (37.3 C) (12/11 1901) Temp src: Oral (12/11 1901) BP: 139/74 mmHg (12/11 1901) Pulse Rate: 98  (12/11 1901)  Labs:  Basename 12/13/11 1135 12/11/11 2030 12/11/11 1300  WBC 20.4* -- 14.6*  HGB 11.6* -- 10.8*  PLT 267 -- 292  LABCREA -- 60.06 --  CREATININE -- -- 0.54   Estimated Creatinine Clearance: 154.4 ml/min (by C-G formula based on Cr of 0.54).   Medical History: Past Medical History  Diagnosis Date  . PCOD (polycystic ovarian disease)     Followed by [facility], has been on metformin  . Chest pain     multiple ED evaluations, most likely musculoskeletal (cervical radiculopathy), needs MRI spine  . GERD (gastroesophageal reflux disease)   . Diabetes mellitus     pt states doesn't know if has DM  . Gestational diabetes     Medications:  Ampicillin 2 gram IV q6h Assessment: 31yo F admitted for induction at 38+ weeks. Pt has developed maternal temp during delivery.  Goal of Therapy:  Gentamicin peaks 6-3mg/ml and troughs < 180m/ml  Plan:  1. Gentamicin 24066mV q8h. 2. Will continue to follow and assess need for Gentamicin levels based on pt's clinical needs. Thanks  AngVernie Ammons/11/2011,7:25 PM

## 2011-12-13 NOTE — Anesthesia Procedure Notes (Signed)
Epidural Patient location during procedure: OB  Preanesthetic Checklist Completed: patient identified, site marked, surgical consent, pre-op evaluation, timeout performed, IV checked, risks and benefits discussed and monitors and equipment checked  Epidural Patient position: sitting Prep: site prepped and draped and DuraPrep Patient monitoring: continuous pulse ox and blood pressure Approach: midline Injection technique: LOR air  Needle:  Needle type: Tuohy  Needle gauge: 17 G Needle length: 9 cm and 9 Needle insertion depth: 9 cm Catheter type: closed end flexible Catheter size: 19 Gauge Catheter at skin depth: 18 cm Test dose: negative  Assessment Events: blood not aspirated, injection not painful, no injection resistance, negative IV test and no paresthesia  Additional Notes Dosing of Epidural:  1st dose, through catheter ............................................Marland Kitchen epi 1:200K + Xylocaine 40 mg  2nd dose, through catheter, after waiting 3 minutes...Marland KitchenMarland Kitchenepi 1:200K + Xylocaine 60 mg    ( 2% Xylo charted as a single dose in Epic Meds for ease of charting; actual dosing was fractionated as above, for saftey's sake)  As each dose occurred, patient was free of IV sx; and patient exhibited no evidence of SA injection.  Patient is more comfortable after epidural dosed. Please see RN's note for documentation of vital signs,and FHR which are stable.  Patient reminded not to try to ambulate with numb legs, and that an RN must be present when she attempts to get up.

## 2011-12-13 NOTE — Progress Notes (Signed)
CAPRISHA BRIDGETT is a 31 y.o. G1P0000 at 25w4dby LMP admitted for induction of labor due to Pre-eclamptic toxemia of pregnancy..  Subjective: Comfortable.  Objective: BP 151/85  Pulse 91  Temp 98.1 F (36.7 C) (Oral)  Resp 18  Ht 6' 1"  (1.854 m)  Wt 131.543 kg (290 lb)  BMI 38.26 kg/m2      FHT:  FHR: 140 bpm, variability: moderate,  accelerations:  Present,  decelerations:  Absent UC:   irregular, every 4 minutes SVE:   Dilation: 2.5 Effacement (%): 80;90 Station: -2 Exam by:: HApple ComputerRN  Labs: Lab Results  Component Value Date   WBC 14.6* 12/11/2011   HGB 10.8* 12/11/2011   HCT 33.6* 12/11/2011   MCV 83.8 12/11/2011   PLT 292 12/11/2011   CBG (last 3)   Basename 12/13/11 0959 12/13/11 0556 12/13/11 0207  GLUCAP 129* 110* 157*      Assessment / Plan: IOL secondary to preeclampsia DM complicating pregnancy on an oral agent  Labor: latent labor Preeclampsia:  labs stable Fetal Wellbeing:  Category I Pain Control:  Labor support without medications I/D:  n/a Anticipated MOD:  NSVD Sliding scale for glycemic control for now; glucomander in active labor  JACKSON-MOORE,Georgi Navarrete A 12/13/2011, 10:31 AM

## 2011-12-13 NOTE — Progress Notes (Addendum)
Spoke with Dr. Jodi Mourning via phone. Plan to stop Pitocin at this time. Restart around 0100 at 27m/min and increase to 643mmin. Also noted to limit VEs. Orders for CBG Q4hrs as well. No further orders at this time.

## 2011-12-14 ENCOUNTER — Ambulatory Visit (HOSPITAL_COMMUNITY): Payer: Medicaid Other

## 2011-12-14 ENCOUNTER — Ambulatory Visit (HOSPITAL_COMMUNITY): Admission: RE | Admit: 2011-12-14 | Payer: Medicaid Other | Source: Ambulatory Visit

## 2011-12-14 ENCOUNTER — Encounter (HOSPITAL_COMMUNITY): Payer: Self-pay | Admitting: Anesthesiology

## 2011-12-14 ENCOUNTER — Encounter (HOSPITAL_COMMUNITY): Payer: Self-pay | Admitting: Obstetrics

## 2011-12-14 ENCOUNTER — Inpatient Hospital Stay (HOSPITAL_COMMUNITY): Payer: Medicaid Other | Admitting: Anesthesiology

## 2011-12-14 ENCOUNTER — Encounter (HOSPITAL_COMMUNITY)
Admission: AD | Disposition: A | Discharge status: Home / Self Care | Payer: Self-pay | Source: Ambulatory Visit | Attending: Obstetrics & Gynecology

## 2011-12-14 LAB — GLUCOSE, CAPILLARY
Glucose-Capillary: 126 mg/dL — ABNORMAL HIGH (ref 70–99)
Glucose-Capillary: 172 mg/dL — ABNORMAL HIGH (ref 70–99)

## 2011-12-14 SURGERY — Surgical Case
Anesthesia: Epidural | Site: Abdomen | Wound class: Clean Contaminated

## 2011-12-14 MED ORDER — KETOROLAC TROMETHAMINE 60 MG/2ML IM SOLN
INTRAMUSCULAR | Status: AC
  Administered 2011-12-14: 60 mg via INTRAMUSCULAR
  Filled 2011-12-14: qty 2

## 2011-12-14 MED ORDER — ONDANSETRON HCL 4 MG/2ML IJ SOLN: 4.0000 mg | Freq: Three times a day (TID) | INTRAMUSCULAR | Status: DC | PRN

## 2011-12-14 MED ORDER — LACTATED RINGERS IV SOLN
INTRAVENOUS | Status: DC | PRN
  Administered 2011-12-14: 06:00:00 via INTRAVENOUS
  Administered 2011-12-14: 1000 mL

## 2011-12-14 MED ORDER — DIPHENHYDRAMINE HCL 50 MG/ML IJ SOLN: 25.0000 mg | INTRAMUSCULAR | Status: DC | PRN

## 2011-12-14 MED ORDER — WITCH HAZEL-GLYCERIN EX PADS: 1.0000 "application " | MEDICATED_PAD | CUTANEOUS | Status: DC | PRN

## 2011-12-14 MED ORDER — LACTATED RINGERS IV SOLN
INTRAVENOUS | Status: DC | PRN
  Administered 2011-12-14 (×2): via INTRAVENOUS

## 2011-12-14 MED ORDER — 0.9 % SODIUM CHLORIDE (POUR BTL) OPTIME
TOPICAL | Status: DC | PRN
  Administered 2011-12-14: 2000 mL

## 2011-12-14 MED ORDER — IBUPROFEN 600 MG PO TABS
600.0000 mg | ORAL_TABLET | Freq: Four times a day (QID) | ORAL | Status: DC
  Administered 2011-12-14 – 2011-12-16 (×6): 600 mg via ORAL
  Filled 2011-12-14 (×6): qty 1

## 2011-12-14 MED ORDER — SCOPOLAMINE 1 MG/3DAYS TD PT72
MEDICATED_PATCH | TRANSDERMAL | Status: AC
  Administered 2011-12-14: 1.5 mg via TRANSDERMAL
  Filled 2011-12-14: qty 1

## 2011-12-14 MED ORDER — MEASLES, MUMPS & RUBELLA VAC ~~LOC~~ INJ
0.5000 mL | INJECTION | Freq: Once | SUBCUTANEOUS | Status: DC
  Filled 2011-12-14: qty 0.5

## 2011-12-14 MED ORDER — DIPHENHYDRAMINE HCL 25 MG PO CAPS: 25.0000 mg | ORAL_CAPSULE | Freq: Four times a day (QID) | ORAL | Status: DC | PRN

## 2011-12-14 MED ORDER — DIPHENHYDRAMINE HCL 50 MG/ML IJ SOLN: 12.5000 mg | INTRAMUSCULAR | Status: DC | PRN

## 2011-12-14 MED ORDER — DEXTROSE 50 % IV SOLN: 25.0000 mL | INTRAVENOUS | Status: DC | PRN

## 2011-12-14 MED ORDER — ONDANSETRON HCL 4 MG/2ML IJ SOLN
INTRAMUSCULAR | Status: AC
  Filled 2011-12-14: qty 2

## 2011-12-14 MED ORDER — SENNOSIDES-DOCUSATE SODIUM 8.6-50 MG PO TABS
2.0000 | ORAL_TABLET | Freq: Every day | ORAL | Status: DC
  Administered 2011-12-14 – 2011-12-15 (×2): 2 via ORAL

## 2011-12-14 MED ORDER — SCOPOLAMINE 1 MG/3DAYS TD PT72
1.0000 | MEDICATED_PATCH | Freq: Once | TRANSDERMAL | Status: DC
  Administered 2011-12-14: 1.5 mg via TRANSDERMAL

## 2011-12-14 MED ORDER — PROMETHAZINE HCL 25 MG/ML IJ SOLN
6.2500 mg | INTRAMUSCULAR | Status: DC | PRN
Start: 2011-12-14 — End: 2011-12-14

## 2011-12-14 MED ORDER — MAGNESIUM HYDROXIDE 400 MG/5ML PO SUSP
30.0000 mL | ORAL | Status: DC | PRN
  Administered 2011-12-15: 30 mL via ORAL
  Filled 2011-12-14: qty 30

## 2011-12-14 MED ORDER — NALOXONE HCL 1 MG/ML IJ SOLN
1.0000 ug/kg/h | INTRAVENOUS | Status: DC | PRN
  Filled 2011-12-14: qty 2

## 2011-12-14 MED ORDER — LANOLIN HYDROUS EX OINT: 1.0000 "application " | TOPICAL_OINTMENT | CUTANEOUS | Status: DC | PRN

## 2011-12-14 MED ORDER — HYDROMORPHONE HCL PF 1 MG/ML IJ SOLN
INTRAMUSCULAR | Status: AC
  Administered 2011-12-14: 0.5 mg via INTRAVENOUS
  Filled 2011-12-14: qty 1

## 2011-12-14 MED ORDER — CITRIC ACID-SODIUM CITRATE 334-500 MG/5ML PO SOLN
ORAL | Status: AC
  Filled 2011-12-14: qty 15

## 2011-12-14 MED ORDER — PRENATAL MULTIVITAMIN CH
1.0000 | ORAL_TABLET | Freq: Every day | ORAL | Status: DC
  Administered 2011-12-15 – 2011-12-16 (×2): 1 via ORAL
  Filled 2011-12-14 (×2): qty 1

## 2011-12-14 MED ORDER — KETOROLAC TROMETHAMINE 30 MG/ML IJ SOLN: 30.0000 mg | Freq: Four times a day (QID) | INTRAMUSCULAR | Status: AC | PRN

## 2011-12-14 MED ORDER — FENTANYL CITRATE 0.05 MG/ML IJ SOLN
INTRAMUSCULAR | Status: AC
  Filled 2011-12-14: qty 2

## 2011-12-14 MED ORDER — MEPERIDINE HCL 25 MG/ML IJ SOLN: 6.2500 mg | INTRAMUSCULAR | Status: DC | PRN

## 2011-12-14 MED ORDER — ONDANSETRON HCL 4 MG/2ML IJ SOLN
INTRAMUSCULAR | Status: DC | PRN
  Administered 2011-12-14: 4 mg via INTRAVENOUS

## 2011-12-14 MED ORDER — MORPHINE SULFATE (PF) 0.5 MG/ML IJ SOLN
INTRAMUSCULAR | Status: DC | PRN
  Administered 2011-12-14: 1 mg via INTRAVENOUS

## 2011-12-14 MED ORDER — OXYTOCIN 10 UNIT/ML IJ SOLN
40.0000 [IU] | INTRAVENOUS | Status: DC | PRN
  Administered 2011-12-14: 40 [IU] via INTRAVENOUS

## 2011-12-14 MED ORDER — CLINDAMYCIN PHOSPHATE 900 MG/50ML IV SOLN
900.0000 mg | Freq: Once | INTRAVENOUS | Status: AC
  Administered 2011-12-14: 900 mg via INTRAVENOUS
  Filled 2011-12-14: qty 50

## 2011-12-14 MED ORDER — OXYTOCIN 40 UNITS IN LACTATED RINGERS INFUSION - SIMPLE MED: 62.5000 mL/h | INTRAVENOUS | Status: AC

## 2011-12-14 MED ORDER — METOCLOPRAMIDE HCL 5 MG/ML IJ SOLN: 10.0000 mg | Freq: Three times a day (TID) | INTRAMUSCULAR | Status: DC | PRN

## 2011-12-14 MED ORDER — PNEUMOCOCCAL VAC POLYVALENT 25 MCG/0.5ML IJ INJ
0.5000 mL | INJECTION | INTRAMUSCULAR | Status: AC
  Administered 2011-12-15: 0.5 mL via INTRAMUSCULAR
  Filled 2011-12-14: qty 0.5

## 2011-12-14 MED ORDER — SIMETHICONE 80 MG PO CHEW
80.0000 mg | CHEWABLE_TABLET | ORAL | Status: DC | PRN
  Administered 2011-12-14 (×2): 80 mg via ORAL

## 2011-12-14 MED ORDER — DIPHENHYDRAMINE HCL 25 MG PO CAPS: 25.0000 mg | ORAL_CAPSULE | ORAL | Status: DC | PRN

## 2011-12-14 MED ORDER — DEXTROSE-NACL 5-0.45 % IV SOLN
INTRAVENOUS | Status: DC
  Administered 2011-12-14: 01:00:00 via INTRAVENOUS

## 2011-12-14 MED ORDER — SODIUM BICARBONATE 8.4 % IV SOLN
INTRAVENOUS | Status: AC
  Filled 2011-12-14: qty 50

## 2011-12-14 MED ORDER — ONDANSETRON HCL 4 MG PO TABS: 4.0000 mg | ORAL_TABLET | ORAL | Status: DC | PRN

## 2011-12-14 MED ORDER — KETOROLAC TROMETHAMINE 60 MG/2ML IM SOLN
60.0000 mg | Freq: Once | INTRAMUSCULAR | Status: AC | PRN
  Administered 2011-12-14: 60 mg via INTRAMUSCULAR

## 2011-12-14 MED ORDER — ACETAMINOPHEN 10 MG/ML IV SOLN
1000.0000 mg | Freq: Once | INTRAVENOUS | Status: AC | PRN
  Administered 2011-12-14: 1000 mg via INTRAVENOUS
  Filled 2011-12-14: qty 100

## 2011-12-14 MED ORDER — SODIUM CHLORIDE 0.9 % IV SOLN
INTRAVENOUS | Status: DC
  Administered 2011-12-14: 0.9 [IU]/h via INTRAVENOUS
  Filled 2011-12-14: qty 1

## 2011-12-14 MED ORDER — ZOLPIDEM TARTRATE 5 MG PO TABS: 5.0000 mg | ORAL_TABLET | Freq: Every evening | ORAL | Status: DC | PRN

## 2011-12-14 MED ORDER — OXYTOCIN 10 UNIT/ML IJ SOLN
INTRAMUSCULAR | Status: AC
  Filled 2011-12-14: qty 4

## 2011-12-14 MED ORDER — FERROUS SULFATE 325 (65 FE) MG PO TABS
325.0000 mg | ORAL_TABLET | Freq: Two times a day (BID) | ORAL | Status: DC
  Administered 2011-12-15 – 2011-12-16 (×3): 325 mg via ORAL
  Filled 2011-12-14 (×2): qty 1

## 2011-12-14 MED ORDER — DIBUCAINE 1 % RE OINT: 1.0000 "application " | TOPICAL_OINTMENT | RECTAL | Status: DC | PRN

## 2011-12-14 MED ORDER — FENTANYL CITRATE 0.05 MG/ML IJ SOLN
INTRAMUSCULAR | Status: DC | PRN
  Administered 2011-12-14 (×2): 50 ug via INTRAVENOUS

## 2011-12-14 MED ORDER — SODIUM CHLORIDE 0.9 % IV SOLN: INTRAVENOUS | Status: DC

## 2011-12-14 MED ORDER — MORPHINE SULFATE (PF) 0.5 MG/ML IJ SOLN
INTRAMUSCULAR | Status: DC | PRN
  Administered 2011-12-14: 4 mg via EPIDURAL

## 2011-12-14 MED ORDER — SODIUM CHLORIDE 0.9 % IJ SOLN: 3.0000 mL | INTRAMUSCULAR | Status: DC | PRN

## 2011-12-14 MED ORDER — MORPHINE SULFATE 0.5 MG/ML IJ SOLN
INTRAMUSCULAR | Status: AC
  Filled 2011-12-14: qty 10

## 2011-12-14 MED ORDER — NALBUPHINE SYRINGE 5 MG/0.5 ML
5.0000 mg | INJECTION | INTRAMUSCULAR | Status: DC | PRN
  Filled 2011-12-14: qty 1

## 2011-12-14 MED ORDER — TETANUS-DIPHTH-ACELL PERTUSSIS 5-2.5-18.5 LF-MCG/0.5 IM SUSP: 0.5000 mL | Freq: Once | INTRAMUSCULAR | Status: DC

## 2011-12-14 MED ORDER — ONDANSETRON HCL 4 MG/2ML IJ SOLN: 4.0000 mg | INTRAMUSCULAR | Status: DC | PRN

## 2011-12-14 MED ORDER — LIDOCAINE-EPINEPHRINE (PF) 2 %-1:200000 IJ SOLN
INTRAMUSCULAR | Status: AC
  Filled 2011-12-14: qty 20

## 2011-12-14 MED ORDER — IBUPROFEN 600 MG PO TABS: 600.0000 mg | ORAL_TABLET | Freq: Four times a day (QID) | ORAL | Status: DC | PRN

## 2011-12-14 MED ORDER — INSULIN REGULAR BOLUS VIA INFUSION: 0.0000 [IU] | Freq: Three times a day (TID) | INTRAVENOUS | Status: DC

## 2011-12-14 MED ORDER — NALOXONE HCL 0.4 MG/ML IJ SOLN: 0.4000 mg | INTRAMUSCULAR | Status: DC | PRN

## 2011-12-14 MED ORDER — LACTATED RINGERS IV SOLN
INTRAVENOUS | Status: DC
  Administered 2011-12-14: 125 mL/h via INTRAVENOUS

## 2011-12-14 MED ORDER — HYDROMORPHONE HCL PF 1 MG/ML IJ SOLN
0.2500 mg | INTRAMUSCULAR | Status: DC | PRN
  Administered 2011-12-14 (×2): 0.5 mg via INTRAVENOUS

## 2011-12-14 MED ORDER — OXYCODONE-ACETAMINOPHEN 5-325 MG PO TABS: 1.0000 | ORAL_TABLET | ORAL | Status: DC | PRN

## 2011-12-14 SURGICAL SUPPLY — 52 items
APL SKNCLS STERI-STRIP NONHPOA (GAUZE/BANDAGES/DRESSINGS)
BENZOIN TINCTURE PRP APPL 2/3 (GAUZE/BANDAGES/DRESSINGS) ×1 IMPLANT
CANISTER WOUND CARE 500ML ATS (WOUND CARE) IMPLANT
CLOTH BEACON ORANGE TIMEOUT ST (SAFETY) ×2 IMPLANT
CONTAINER PREFILL 10% NBF 15ML (MISCELLANEOUS) IMPLANT
DRAPE LG THREE QUARTER DISP (DRAPES) ×2 IMPLANT
DRESSING TELFA 8X3 (GAUZE/BANDAGES/DRESSINGS) ×1 IMPLANT
DRSG OPSITE POSTOP 4X10 (GAUZE/BANDAGES/DRESSINGS) ×2 IMPLANT
DRSG VAC ATS LRG SENSATRAC (GAUZE/BANDAGES/DRESSINGS) IMPLANT
DRSG VAC ATS MED SENSATRAC (GAUZE/BANDAGES/DRESSINGS) IMPLANT
DRSG VAC ATS SM SENSATRAC (GAUZE/BANDAGES/DRESSINGS) IMPLANT
DURAPREP 26ML APPLICATOR (WOUND CARE) ×2 IMPLANT
ELECT REM PT RETURN 9FT ADLT (ELECTROSURGICAL) ×2
ELECTRODE REM PT RTRN 9FT ADLT (ELECTROSURGICAL) ×1 IMPLANT
EXTRACTOR VACUUM M CUP 4 TUBE (SUCTIONS) IMPLANT
GAUZE SPONGE 4X4 12PLY STRL LF (GAUZE/BANDAGES/DRESSINGS) ×2 IMPLANT
GLOVE BIO SURGEON STRL SZ 6.5 (GLOVE) ×4 IMPLANT
GLOVE BIOGEL PI IND STRL 7.0 (GLOVE) IMPLANT
GLOVE BIOGEL PI INDICATOR 7.0 (GLOVE) ×1
GLOVE SKINSENSE NS SZ7.5 (GLOVE) ×1
GLOVE SKINSENSE NS SZ8.0 LF (GLOVE) ×1
GLOVE SKINSENSE STRL SZ7.5 (GLOVE) IMPLANT
GLOVE SKINSENSE STRL SZ8.0 LF (GLOVE) IMPLANT
GOWN PREVENTION PLUS LG XLONG (DISPOSABLE) ×6 IMPLANT
KIT ABG SYR 3ML LUER SLIP (SYRINGE) IMPLANT
NDL HYPO 25X5/8 SAFETYGLIDE (NEEDLE) ×1 IMPLANT
NEEDLE HYPO 25X5/8 SAFETYGLIDE (NEEDLE) ×2 IMPLANT
NS IRRIG 1000ML POUR BTL (IV SOLUTION) ×2 IMPLANT
PACK C SECTION WH (CUSTOM PROCEDURE TRAY) ×2 IMPLANT
PAD ABD 7.5X8 STRL (GAUZE/BANDAGES/DRESSINGS) ×1 IMPLANT
PAD OB MATERNITY 4.3X12.25 (PERSONAL CARE ITEMS) ×1 IMPLANT
RETRACTOR WOUND ALXS 34CM XLRG (MISCELLANEOUS) IMPLANT
RTRCTR C-SECT PINK 25CM LRG (MISCELLANEOUS) ×1 IMPLANT
RTRCTR WOUND ALEXIS 34CM XLRG (MISCELLANEOUS) ×2
SLEEVE SCD COMPRESS KNEE MED (MISCELLANEOUS) ×1 IMPLANT
STAPLER VISISTAT 35W (STAPLE) ×1 IMPLANT
STRIP CLOSURE SKIN 1/2X4 (GAUZE/BANDAGES/DRESSINGS) ×1 IMPLANT
SUT MNCRL 0 VIOLET CTX 36 (SUTURE) ×2 IMPLANT
SUT MNCRL AB 3-0 PS2 27 (SUTURE) ×1 IMPLANT
SUT MONOCRYL 0 CTX 36 (SUTURE) ×2
SUT PDS AB 0 CTX 36 PDP370T (SUTURE) ×1 IMPLANT
SUT PLAIN 0 NONE (SUTURE) IMPLANT
SUT PLAIN 2 0 XLH (SUTURE) ×1 IMPLANT
SUT VIC AB 0 CTXB 36 (SUTURE) ×2 IMPLANT
SUT VIC AB 2-0 CT1 (SUTURE) ×2 IMPLANT
SUT VIC AB 2-0 CT1 27 (SUTURE) ×2
SUT VIC AB 2-0 CT1 TAPERPNT 27 (SUTURE) ×1 IMPLANT
SUT VIC AB 2-0 SH 27 (SUTURE)
SUT VIC AB 2-0 SH 27XBRD (SUTURE) IMPLANT
TOWEL OR 17X24 6PK STRL BLUE (TOWEL DISPOSABLE) ×6 IMPLANT
TRAY FOLEY CATH 14FR (SET/KITS/TRAYS/PACK) ×1 IMPLANT
WATER STERILE IRR 1000ML POUR (IV SOLUTION) ×1 IMPLANT

## 2011-12-14 NOTE — Anesthesia Postprocedure Evaluation (Signed)
  Anesthesia Post-op Note  Patient: Misty [name]  Procedure(s) Performed: Procedure(s) (LRB) with comments: CESAREAN SECTION (N/A) - Primary Cesarean Section Delivery Girl @ [phone], Apgars  Patient Location: PACU and Mother/Baby  Anesthesia Type:Epidural  Level of Consciousness: awake, alert  and oriented  Airway and Oxygen Therapy: Patient Spontanous Breathing  Post-op Pain: mild  Post-op Assessment: Post-op Vital signs reviewed, Patient's Cardiovascular Status Stable, No headache, No backache, No residual numbness and No residual motor weakness  Post-op Vital Signs: Reviewed and stable  Complications: No apparent anesthesia complications

## 2011-12-14 NOTE — Progress Notes (Signed)
Misty [name] is a 32 y.o. G1P0000 at 65w5dby LMP admitted for induction of labor due to Gestational diabetes and Pre-eclamptic toxemia of pregnancy..  Subjective: Feels pressure  Objective: BP 155/84  Pulse 109  Temp 98.9 F (37.2 C) (Oral)  Resp 20  Ht 6' 1"  (1.854 m)  Wt 131.543 kg (290 lb)  BMI 38.26 kg/m2  SpO2 100%   Total I/O In: -  Out: 800 [Urine:800]  FHT:  FHR: 160 bpm, variability: moderate,  accelerations:  Abscent,  decelerations:  Absent UC:   irregular, every 2 minutes SVE:   Dilation: 10 Effacement (%): 100 Station: 0 (with caput) Exam by:: HApple ComputerRN  Labs: Lab Results  Component Value Date   WBC 20.4* 12/13/2011   HGB 11.6* 12/13/2011   HCT 35.0* 12/13/2011   MCV 83.3 12/13/2011   PLT 267 12/13/2011   CBG (last 3)   Basename 12/14/11 0451 12/14/11 0356 12/14/11 0250  GLUCAP 139* 126* 124*    Assessment / Plan: Arrest of descent  Labor: see above Preeclampsia:  stable Fetal Wellbeing:  Category I Pain Control:  Epidural I/D:  early chorioamnionitis Anticipated MOD:  C/D; informed consent obtained  Clindamycin x 1 now  JACKSON-MOORE,Aleta Manternach A 12/14/2011, 5:37 AM

## 2011-12-14 NOTE — Addendum Note (Signed)
Addendum  created 12/14/11 1809 by Flossie Dibble, CRNA   Modules edited:Notes Section

## 2011-12-14 NOTE — Anesthesia Postprocedure Evaluation (Signed)
Anesthesia Post Note  Patient: Misty [name]  Procedure(s) Performed: Procedure(s) (LRB): CESAREAN SECTION (N/A)  Anesthesia type: Epidural  Patient location: PACU  Post pain: Pain level controlled  Post assessment: Post-op Vital signs reviewed  Last Vitals:  Filed Vitals:   12/14/11 0800  BP: 143/86  Pulse: 87  Temp: 37.2 C  Resp: 20    Post vital signs: stable  Level of consciousness: awake  Complications: No apparent anesthesia complications

## 2011-12-14 NOTE — Transfer of Care (Signed)
Immediate Anesthesia Transfer of Care Note  Patient: Misty [name]  Procedure(s) Performed: Procedure(s) (LRB) with comments: CESAREAN SECTION (N/A) - Primary Cesarean Section Delivery Girl @ [phone], Apgars  Patient Location: PACU  Anesthesia Type:Epidural  Level of Consciousness: awake, alert , oriented and patient cooperative  Airway & Oxygen Therapy: Patient Spontanous Breathing  Post-op Assessment: Report given to PACU RN  Post vital signs: Reviewed and stable  Complications: No apparent anesthesia complications

## 2011-12-14 NOTE — Op Note (Signed)
Cesarean Section Procedure Note   [name]   12/11/2011 - 12/14/2011  Indications: Dystocia   Pre-operative Diagnosis: Arrest of descent  Post-operative Diagnosis: Same   Surgeon: Lahoma Crocker A  Assistants: none  Anesthesia: epidural  Procedure Details:  The patient was seen in the Holding Room. The risks, benefits, complications, treatment options, and expected outcomes were discussed with the patient. The patient concurred with the proposed plan, giving informed consent. The patient was identified as Misty [name] and the procedure verified as C-Section Delivery. A Time Out was held and the above information confirmed.  After induction of anesthesia, the patient was draped and prepped in the usual sterile manner. A transverse incision was made and carried down through the subcutaneous tissue to the fascia. The fascial incision was made and extended transversely. The fascia was separated from the underlying rectus tissue superiorly and inferiorly. The peritoneum was identified and entered. The peritoneal incision was extended longitudinally. An Alexis retractor was placed into the incision.   A low transverse uterine incision was made. Delivered from cephalic presentation was a living newborn  infant. APGAR (1 MIN): 8   APGAR (5 MINS): 9   A cord ph was not sent. The umbilical cord was clamped and cut cord. A sample was obtained for evaluation. The placenta was removed Intact and appeared normal.  The uterine incision was closed with running locked sutures of 1-0 Monocryl. A second imbricating layer of the same suture was placed.  Hemostasis was observed. The paracolic gutters were irrigated. The parieto peritoneum was closed in a running fashion with 2-0 Vicryl.  The fascia was then reapproximated with running sutures of 0 Vicryl.  Interrupted 2-0 plain sutures were placed in the subcutaneous fat layer.  The skin was closed with staples.  Instrument,  sponge, and needle counts were correct prior the abdominal closure and were correct at the conclusion of the case.    Findings:  See above   Estimated Blood Loss: 600 ml  Total IV Fluids: per Anesthesiology  Urine Output: per Anesthesiology  Specimens: Placenta   Complications: no complications  Disposition: PACU - hemodynamically stable.  Maternal Condition: stable   Baby condition / location:  nursery-stable    Signed: Surgeon(s): Lahoma Crocker, MD

## 2011-12-15 ENCOUNTER — Encounter (HOSPITAL_COMMUNITY): Payer: Self-pay | Admitting: Obstetrics & Gynecology

## 2011-12-15 LAB — CBC
Platelets: 238 10*3/uL (ref 150–400)
RBC: 3.62 MIL/uL — ABNORMAL LOW (ref 3.87–5.11)
WBC: 18.5 10*3/uL — ABNORMAL HIGH (ref 4.0–10.5)

## 2011-12-15 LAB — TYPE AND SCREEN: Unit division: 0

## 2011-12-15 LAB — CREATININE, SERUM
Creatinine, Ser: 0.73 mg/dL (ref 0.50–1.10)
GFR calc Af Amer: >90 mL/min (ref >90)

## 2011-12-15 NOTE — Progress Notes (Signed)
Subjective: Postpartum Day 2: Cesarean Delivery Patient reports no complaints.  Objective: Vital signs in last 24 hours: Temp:  [97.9 F (36.6 C)-98.9 F (37.2 C)] 98.2 F (36.8 C) (12/13 1434) Pulse Rate:  [79-97] 97  (12/13 1436) Resp:  [16-18] 16  (12/13 1434) BP: (117-143)/(76-84) 130/81 mmHg (12/13 1436) SpO2:  [97 %-99 %] 97 % (12/12 2149)  Physical Exam:  General: alert and no distress Lochia: appropriate Uterine Fundus: firm Incision: healing well DVT Evaluation: No evidence of DVT seen on physical exam.   Basename 12/15/11 0535 12/13/11 1135  HGB 9.7* 11.6*  HCT 30.2* 35.0*    Assessment/Plan: Status post Cesarean section. Doing well postoperatively.  Continue current care.  HARPER,CHARLES A 12/15/2011, 2:54 PM

## 2011-12-16 NOTE — Discharge Summary (Signed)
Obstetric Discharge Summary Reason for Admission: induction of labor Prenatal Procedures: none Intrapartum Procedures: cesarean: low cervical, transverse Postpartum Procedures: none Complications-Operative and Postpartum: none Hemoglobin  Date Value Range Status  12/15/2011 9.7* 12.0 - 15.0 g/dL Final     DELTA CHECK NOTED     REPEATED TO VERIFY     HCT  Date Value Range Status  12/15/2011 30.2* 36.0 - 46.0 % Final    Physical Exam:  General: alert Lochia: appropriate Uterine Fundus: firm Incision: healing well DVT Evaluation: No evidence of DVT seen on physical exam.  Discharge Diagnoses: Term Pregnancy-delivered  Discharge Information: Date: 12/16/2011 Activity: pelvic rest Diet: routine Medications: Percocet Condition: stable Instructions: refer to practice specific booklet Discharge to: home Follow-up Information    Schedule an appointment as soon as possible for a visit in 6 weeks to follow up.         Newborn Data: Live born female  Birth Weight: 7 lb 7.8 oz (3395 g) APGAR: 8, 9  Home with mother.  MARSHALL,BERNARD A 12/16/2011, 7:35 AM

## 2011-12-21 ENCOUNTER — Ambulatory Visit (HOSPITAL_COMMUNITY): Payer: Medicaid Other

## 2012-02-13 ENCOUNTER — Ambulatory Visit: Payer: Medicaid Other

## 2012-03-04 ENCOUNTER — Ambulatory Visit: Payer: Medicaid Other | Admitting: Internal Medicine

## 2012-03-06 ENCOUNTER — Encounter: Payer: Medicaid Other | Admitting: Internal Medicine

## 2012-03-07 ENCOUNTER — Ambulatory Visit: Payer: Medicaid Other | Admitting: Internal Medicine

## 2012-03-14 ENCOUNTER — Encounter: Payer: Self-pay | Admitting: Internal Medicine

## 2012-03-15 ENCOUNTER — Encounter: Payer: Self-pay | Admitting: Internal Medicine

## 2012-04-11 ENCOUNTER — Encounter (HOSPITAL_COMMUNITY): Payer: Self-pay | Admitting: *Deleted

## 2012-04-11 ENCOUNTER — Emergency Department (INDEPENDENT_AMBULATORY_CARE_PROVIDER_SITE_OTHER)
Admission: EM | Admit: 2012-04-11 | Discharge: 2012-04-11 | Disposition: A | Discharge status: Home / Self Care | Payer: Medicaid Other | Source: Home / Self Care

## 2012-04-11 ENCOUNTER — Telehealth: Payer: Self-pay | Admitting: *Deleted

## 2012-04-11 DIAGNOSIS — M7652 Patellar tendinitis, left knee: Secondary | ICD-10-CM

## 2012-04-11 DIAGNOSIS — S335XXA Sprain of ligaments of lumbar spine, initial encounter: Secondary | ICD-10-CM

## 2012-04-11 DIAGNOSIS — M7040 Prepatellar bursitis, unspecified knee: Secondary | ICD-10-CM

## 2012-04-11 DIAGNOSIS — M7042 Prepatellar bursitis, left knee: Secondary | ICD-10-CM

## 2012-04-11 DIAGNOSIS — S39012A Strain of muscle, fascia and tendon of lower back, initial encounter: Secondary | ICD-10-CM

## 2012-04-11 DIAGNOSIS — M7650 Patellar tendinitis, unspecified knee: Secondary | ICD-10-CM

## 2012-04-11 MED ORDER — TRAMADOL HCL 50 MG PO TABS: 50.0000 mg | ORAL_TABLET | Freq: Four times a day (QID) | ORAL | Status: DC | PRN

## 2012-04-11 MED ORDER — METHYLPREDNISOLONE 4 MG PO KIT: PACK | ORAL | Status: DC

## 2012-04-11 NOTE — Telephone Encounter (Signed)
Patient states she has a "boil" on her incision. She was seen at Urgent Care and they told her it was infected- but wouldn't treat her. Appointment with Dr Delsa Sale at 10:00 in am.

## 2012-04-11 NOTE — ED Provider Notes (Signed)
Medical screening examination/treatment/procedure(s) were performed by non-physician practitioner and as supervising physician I was immediately available for consultation/collaboration.  Philipp Deputy, M.D.  Harden Mo, MD 04/11/12 2223

## 2012-04-11 NOTE — ED Notes (Signed)
Pt reports left knee pain with no known injury - obvious swelling to knee - also states that back is hurting.

## 2012-04-11 NOTE — ED Provider Notes (Signed)
History     CSN: [id number]  Arrival date & time 04/11/12  1135   None     Chief Complaint  Patient presents with  . Knee Pain  . Back Pain    (Consider location/radiation/quality/duration/timing/severity/associated sxs/prior treatment) HPI Comments: 32 year old morbidly obese female complaining of left knee pain for one week. She denies any known injury. The pain is located over the inferior aspect of the patella. The pain is persistent with rest, flexion and extension. Not quite so bad when bearing weight with ambulation. She is also complaining of right low back pain. This occurred about the same time as her knee pain. The pain occurs with bending, twisting and other movements and positions. Denies trauma, injury or fall that may have caused the pain. Denies distal neurologic symptoms or deficits. No radiation of pain.   Past Medical History  Diagnosis Date  . PCOD (polycystic ovarian disease)     Followed by [facility], has been on metformin  . Chest pain     multiple ED evaluations, most likely musculoskeletal (cervical radiculopathy), needs MRI spine  . GERD (gastroesophageal reflux disease)   . Diabetes mellitus     pt states doesn't know if has DM  . Gestational diabetes     Past Surgical History  Procedure Laterality Date  . Cesarean section  12/14/2011    Procedure: CESAREAN SECTION;  Surgeon: Lahoma Crocker, MD;  Location: [name];  Service: Obstetrics;  Laterality: N/A;  Primary Cesarean Section Delivery Girl @ [phone], Apgars    Family History  Problem Relation Age of Onset  . Diabetes Mother   . Hypertension Mother   . Heart disease Father   . Diabetes Sister   . Hypertension Sister   . Diabetes Brother   . Cancer Maternal Grandmother   . Cancer Paternal Grandmother     History  Substance Use Topics  . Smoking status: Current Every Day Smoker -- 0.50 packs/day    Types: Cigarettes  . Smokeless tobacco: Not on file  . Alcohol Use: No    OB History   Grav  Para Term Preterm Abortions TAB SAB Ect Mult Living   1 1 1  0 0 0 0 0 0 1      Review of Systems  Constitutional: Negative.   Respiratory: Negative.   Gastrointestinal: Negative.   Genitourinary: Negative.   Musculoskeletal: Positive for back pain.       As per history of present illness  Skin: Negative.   Neurological: Negative.     Allergies  Review of patient's allergies indicates no known allergies.  Home Medications   Current Outpatient Rx  Name  Route  Sig  Dispense  Refill  . methylPREDNISolone (MEDROL DOSEPAK) 4 MG tablet      follow package directions   21 tablet   0   . traMADol (ULTRAM) 50 MG tablet   Oral   Take 1 tablet (50 mg total) by mouth every 6 (six) hours as needed for pain.   15 tablet   0     BP 150/88  Pulse 84  Temp(Src) 98.6 F (37 C) (Oral)  Resp 8  SpO2 99%  Breastfeeding? No  Physical Exam  Nursing note and vitals reviewed. Constitutional: She is oriented to person, place, and time. She appears well-developed and well-nourished. No distress.  HENT:  Head: Normocephalic and atraumatic.  Eyes: EOM are normal.  Neck: Neck supple.  Pulmonary/Chest: Effort normal.  Musculoskeletal:  Left knee exam reveals no obvious deformity, discoloration  or swelling. There is a small nodule in the inferior aspect of the patella. This is the source of pain and tenderness. No other areas of bony tenderness or soft tissue tenderness. Flexion and extension is intact. Negative varus valgus maneuver negative drawer, no laxity appreciated. Skin without redness or increased warmth or swelling. Right low back exam reveals tenderness to the right paralumbar musculature. No spinal tenderness, no deformity, swelling or discoloration.  Neurological: She is alert and oriented to person, place, and time. No cranial nerve deficit.  Skin: Skin is warm and dry.  Psychiatric: She has a normal mood and affect.    ED Course  Procedures (including critical care  time)  Labs Reviewed - No data to display No results found.   1. Prepatellar bursitis of left knee   2. Patellar tendinitis of left knee   3. Strain of lumbar paraspinal muscle, initial encounter       MDM  Medrol Dosepak take with food Tramadol 1 by mouth every 6 hours when necessary pain Wear knee sleeve for the next week Apply ice to the top of the knee. Followup with your Dr. in 1 week. Apply heat to your back on and off several times during the day. No heavy lifting, bending or twisting.        Janne Napoleon, NP 04/11/12 1328

## 2012-04-12 ENCOUNTER — Ambulatory Visit (INDEPENDENT_AMBULATORY_CARE_PROVIDER_SITE_OTHER): Payer: Medicaid Other | Admitting: Obstetrics & Gynecology

## 2012-04-12 ENCOUNTER — Encounter: Payer: Self-pay | Admitting: Obstetrics & Gynecology

## 2012-04-12 VITALS — BP 140/90 | HR 82 | Temp 98.2°F | Ht 73.0 in | Wt 263.6 lb

## 2012-04-12 DIAGNOSIS — L0292 Furuncle, unspecified: Secondary | ICD-10-CM

## 2012-04-12 MED ORDER — SULFAMETHOXAZOLE-TRIMETHOPRIM 800-160 MG PO TABS: 1.0000 | ORAL_TABLET | Freq: Two times a day (BID) | ORAL | Status: DC

## 2012-04-12 NOTE — Progress Notes (Signed)
.   Subjective:     Misty [name] is a 32 y.o. female here for a problem visit.  Patient thinks that she has an infected boil below her c-section incision ( c-section done 12/14/2011).  Personal health questionnaire reviewed: no.   Gynecologic History Patient's last menstrual period was 04/10/2012. Contraception: none Last Pap: 03/2011. Results were: normal Last mammogram: N/A  Obstetric History OB History   Grav Para Term Preterm Abortions TAB SAB Ect Mult Living   1 1 1  0 0 0 0 0 0 1     # Outc Date GA Lbr Len/2nd Wgt Sex Del Anes PTL Lv   1 TRM 12/13 [id number]:36 / 03:20 7lb7.8oz(3.395kg) F LVCS EPI  Yes       The following portions of the patient's history were reviewed and updated as appropriate: allergies, current medications, past family history, past medical history, past social history, past surgical history and problem list.  Review of Systems Pertinent items are noted in HPI.    Objective:     Approximately 2 cm inferior to and near the right margin of the cesarean delivery scar there is a 2 cm x 2 cm gender area that was flaking in the center. The area was prepped with Betadine. The skin overlying the area was infiltrated with 1% lidocaine without epinephrine. Several cc of purulent material was aspirated from the abscess. The specimen was sent for culture and sensitivity. The skin overlying the abscess was incised in a transverse fashion. The abscess cavity was probed with a mosquito clamp. The abscess cavity was irrigated and packed with iodoform gauze. 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

## 2012-04-12 NOTE — Patient Instructions (Signed)
Abscess An abscess (boil or furuncle) is an infected area on or under the skin. This area is filled with yellowish-white fluid (pus) and other material (debris). HOME CARE   Only take medicines as told by your doctor.  If you were given antibiotic medicine, take it as directed. Finish the medicine even if you start to feel better.  If gauze is used, follow your doctor's directions for changing the gauze.  To avoid spreading the infection:  Keep your abscess covered with a bandage.  Wash your hands well.  Do not share personal care items, towels, or whirlpools with others.  Avoid skin contact with others.  Keep your skin and clothes clean around the abscess.  Keep all doctor visits as told. GET HELP RIGHT AWAY IF:   You have more pain, puffiness (swelling), or redness in the wound site.  You have more fluid or blood coming from the wound site.  You have muscle aches, chills, or you feel sick.  You have a fever. MAKE SURE YOU:   Understand these instructions.  Will watch your condition.  Will get help right away if you are not doing well or get worse. Document Released: 06/07/2007 Document Revised: 06/20/2011 Document Reviewed: 03/03/2011 [facility] Patient Information 2013 [place].

## 2012-04-15 LAB — WOUND CULTURE

## 2012-04-16 ENCOUNTER — Ambulatory Visit: Payer: Medicaid Other | Admitting: Obstetrics

## 2012-05-21 ENCOUNTER — Ambulatory Visit (INDEPENDENT_AMBULATORY_CARE_PROVIDER_SITE_OTHER): Payer: Medicaid Other | Admitting: *Deleted

## 2012-05-21 DIAGNOSIS — Z0189 Encounter for other specified special examinations: Secondary | ICD-10-CM

## 2012-06-03 ENCOUNTER — Ambulatory Visit (INDEPENDENT_AMBULATORY_CARE_PROVIDER_SITE_OTHER): Payer: Medicaid Other | Admitting: Internal Medicine

## 2012-06-03 ENCOUNTER — Encounter: Payer: Self-pay | Admitting: Internal Medicine

## 2012-06-03 ENCOUNTER — Ambulatory Visit (HOSPITAL_COMMUNITY)
Admission: RE | Admit: 2012-06-03 | Discharge: 2012-06-03 | Disposition: A | Discharge status: Home / Self Care | Payer: Medicaid Other | Source: Ambulatory Visit | Attending: Internal Medicine | Admitting: Internal Medicine

## 2012-06-03 VITALS — BP 110/75 | HR 71 | Temp 98.2°F | Ht 73.0 in | Wt 268.9 lb

## 2012-06-03 DIAGNOSIS — R079 Chest pain, unspecified: Secondary | ICD-10-CM

## 2012-06-03 DIAGNOSIS — R0789 Other chest pain: Secondary | ICD-10-CM | POA: Insufficient documentation

## 2012-06-03 DIAGNOSIS — K219 Gastro-esophageal reflux disease without esophagitis: Secondary | ICD-10-CM

## 2012-06-03 DIAGNOSIS — E1165 Type 2 diabetes mellitus with hyperglycemia: Secondary | ICD-10-CM

## 2012-06-03 DIAGNOSIS — Z0000 Encounter for general adult medical examination without abnormal findings: Secondary | ICD-10-CM

## 2012-06-03 DIAGNOSIS — F17200 Nicotine dependence, unspecified, uncomplicated: Secondary | ICD-10-CM

## 2012-06-03 DIAGNOSIS — E282 Polycystic ovarian syndrome: Secondary | ICD-10-CM

## 2012-06-03 DIAGNOSIS — R002 Palpitations: Secondary | ICD-10-CM

## 2012-06-03 DIAGNOSIS — Z720 Tobacco use: Secondary | ICD-10-CM | POA: Insufficient documentation

## 2012-06-03 DIAGNOSIS — Z7251 High risk heterosexual behavior: Secondary | ICD-10-CM

## 2012-06-03 LAB — GLUCOSE, CAPILLARY: Glucose-Capillary: 122 mg/dL — ABNORMAL HIGH (ref 70–99)

## 2012-06-03 LAB — CBC
HCT: 38.1 % (ref 36.0–46.0)
Hemoglobin: 12.3 g/dL (ref 12.0–15.0)
RBC: 4.93 MIL/uL (ref 3.87–5.11)
WBC: 10.4 10*3/uL (ref 4.0–10.5)

## 2012-06-03 LAB — TSH: TSH: 0.982 u[IU]/mL (ref 0.350–4.500)

## 2012-06-03 LAB — T4, FREE: Free T4: 1.14 ng/dL (ref 0.80–1.80)

## 2012-06-03 LAB — PREGNANCY, URINE: Preg Test, Ur: NEGATIVE

## 2012-06-03 MED ORDER — PANTOPRAZOLE SODIUM 40 MG PO TBEC
40.0000 mg | DELAYED_RELEASE_TABLET | Freq: Every day | ORAL | Status: DC
Start: 2012-06-03 — End: 2012-09-16

## 2012-06-03 NOTE — Patient Instructions (Addendum)
General Instructions: General Instructions: -We will call you with your urine results, you should avoid starting new medicines (except for Tums) until then.  -Take Tums as needed for heartburn/chest discomfort.  -Call us to make an appointment for your Pap smear.  -The list below provides some basic information in regards to birth control. Here at [facility] we offer Depo shots but if you choose other methods such as IUD, you would need to make an appointment with your GYN clinic.  -It was great meeting you! Contraception Choices Contraception (birth control) is the use of any methods or devices to prevent pregnancy. Below are some methods to help avoid pregnancy. HORMONAL METHODS   Contraceptive implant. This is a thin, plastic tube containing progesterone hormone. It does not contain estrogen hormone. Your caregiver inserts the tube in the inner part of the upper arm. The tube can remain in place for up to 3 years. After 3 years, the implant must be removed. The implant prevents the ovaries from releasing an egg (ovulation), thickens the cervical mucus which prevents sperm from entering the uterus, and thins the lining of the inside of the uterus.  Progesterone-only injections. These injections are given every 3 months by your caregiver to prevent pregnancy. This synthetic progesterone hormone stops the ovaries from releasing eggs. It also thickens cervical mucus and changes the uterine lining. This makes it harder for sperm to survive in the uterus.  Birth control pills. These pills contain estrogen and progesterone hormone. They work by stopping the egg from forming in the ovary (ovulation). Birth control pills are prescribed by a caregiver.Birth control pills can also be used to treat heavy periods.  Minipill. This type of birth control pill contains only the progesterone hormone. They are taken every day of each month and must be prescribed by your caregiver.  Birth control  patch. The patch contains hormones similar to those in birth control pills. It must be changed once a week and is prescribed by a caregiver.  Vaginal ring. The ring contains hormones similar to those in birth control pills. It is left in the vagina for 3 weeks, removed for 1 week, and then a new one is put back in place. The patient must be comfortable inserting and removing the ring from the vagina.A caregiver's prescription is necessary.  Emergency contraception. Emergency contraceptives prevent pregnancy after unprotected sexual intercourse. This pill can be taken right after sex or up to 5 days after unprotected sex. It is most effective the sooner you take the pills after having sexual intercourse. Emergency contraceptive pills are available without a prescription. Check with your pharmacist. Do not use emergency contraception as your only form of birth control. BARRIER METHODS   Female condom. This is a thin sheath (latex or rubber) that is worn over the penis during sexual intercourse. It can be used with spermicide to increase effectiveness.  Female condom. This is a soft, loose-fitting sheath that is put into the vagina before sexual intercourse.  Diaphragm. This is a soft, latex, dome-shaped barrier that must be fitted by a caregiver. It is inserted into the vagina, along with a spermicidal jelly. It is inserted before intercourse. The diaphragm should be left in the vagina for 6 to 8 hours after intercourse.  Cervical cap. This is a round, soft, latex or plastic cup that fits over the cervix and must be fitted by a caregiver. The cap can be left in place for up to 48 hours after intercourse.  Sponge. This  is a soft, circular piece of polyurethane foam. The sponge has spermicide in it. It is inserted into the vagina after wetting it and before sexual intercourse.  Spermicides. These are chemicals that kill or block sperm from entering the cervix and uterus. They come in the form of creams,  jellies, suppositories, foam, or tablets. They do not require a prescription. They are inserted into the vagina with an applicator before having sexual intercourse. The process must be repeated every time you have sexual intercourse. INTRAUTERINE CONTRACEPTION  Intrauterine device (IUD). This is a T-shaped device that is put in a woman's uterus during a menstrual period to prevent pregnancy. There are 2 types:  Copper IUD. This type of IUD is wrapped in copper wire and is placed inside the uterus. Copper makes the uterus and fallopian tubes produce a fluid that kills sperm. It can stay in place for 10 years.  Hormone IUD. This type of IUD contains the hormone progestin (synthetic progesterone). The hormone thickens the cervical mucus and prevents sperm from entering the uterus, and it also thins the uterine lining to prevent implantation of a fertilized egg. The hormone can weaken or kill the sperm that get into the uterus. It can stay in place for 5 years. PERMANENT METHODS OF CONTRACEPTION  Female tubal ligation. This is when the woman's fallopian tubes are surgically sealed, tied, or blocked to prevent the egg from traveling to the uterus.  Female sterilization. This is when the female has the tubes that carry sperm tied off (vasectomy).This blocks sperm from entering the vagina during sexual intercourse. After the procedure, the man can still ejaculate fluid (semen). NATURAL PLANNING METHODS  Natural family planning. This is not having sexual intercourse or using a barrier method (condom, diaphragm, cervical cap) on days the woman could become pregnant.  Calendar method. This is keeping track of the length of each menstrual cycle and identifying when you are fertile.  Ovulation method. This is avoiding sexual intercourse during ovulation.  Symptothermal method. This is avoiding sexual intercourse during ovulation, using a thermometer and ovulation symptoms.  Post-ovulation method. This is  timing sexual intercourse after you have ovulated. Regardless of which type or method of contraception you choose, it is important that you use condoms to protect against the transmission of sexually transmitted diseases (STDs). Talk with your caregiver about which form of contraception is most appropriate for you. Document Released: 12/19/2004 Document Revised: 03/13/2011 Document Reviewed: 04/27/2010 [facility] Patient Information 2014 [place], [place].    Treatment Goals:  Goals (1 Years of Data) as of 06/03/12         As of Today 04/12/12 04/11/12 12/16/11 12/16/11     Blood Pressure    . Blood Pressure < 130/80  110/75 140/90 150/88 118/77 152/84     Lifestyle    . Quit smoking / using tobacco  No         Result Component    . HEMOGLOBIN A1C < 7.0  6.2        . LDL CALC < 100            Progress Toward Treatment Goals:  Treatment Goal 06/03/2012  Hemoglobin A1C at goal  Stop smoking smoking less    Self Care Goals & Plans:  Self Care Goal 06/03/2012  Manage my medications take my medicines as prescribed  Monitor my health keep track of my weight  Eat healthy foods drink diet soda or water instead of juice or soda; eat foods that are  low in salt; eat baked foods instead of fried foods; eat smaller portions  Stop smoking call [facility] (1-800-QUIT-NOW); go to the [employer] (https://scott-booker.info/)  Meeting treatment goals maintain the current self-care plan    Home Blood Glucose Monitoring 06/03/2012  Check my blood sugar no home glucose monitoring     Care Management & Community Referrals:  Referral 06/03/2012  Referrals made for care management support none needed  Referrals made to community resources none

## 2012-06-04 DIAGNOSIS — R002 Palpitations: Secondary | ICD-10-CM | POA: Insufficient documentation

## 2012-06-04 NOTE — Progress Notes (Addendum)
  Subjective:    Patient ID: Misty [name], female    DOB: [birth year], 32 y.o.   MRN: [id number]  HPI Misty [name] is a pleasant 32 year old woman with PMH of DM2 and PCOS who presents for follow up visit following the birth of her daughter, Misty [name], on 12/14/11. She has bonded well with her child with family support from her mother and her aunt.   Chest pain: She has had chest pain for 2-3 years now with most recent episode the night prior to her visit. She describes the pain as a pressure in the middle of her chest, as if someone is "sitting" on her chest. She denies pain radiation. She often experiences diaphoresis and nausea with the chest pain. The pain usually comes with exertion lasting a few minutes and is alleviated by rest. Sometimes the pain also comes at rest. She is unsure how many times in a month she experiences chest pain. The pain is not associated with food intake and does not radiate.   Heart Palpitations: She has had heart palpitations for years now and was told in the past that she would need a cardiac monitor but declined further work up at that time. She tells me that her her heart palpitations are most noticeable when she is standing, walking and it sometimes is followed by dizziness. The heart palpitations subside with rest.   Dysmenorrhea: She has irregular menstrual cycles as prior to her pregnancy. She started spotting a few days ago but is unsure if this is her period. Her LMP prior to now was 04/10/12. She is worried she is pregnant as she is sexually active with her boyfriend of 2 years in a monogamous relationship but she currently does not use a birth control method.   Postpartum state: She had a pregnancy complicated by gestational diabetes controlled with diet and labor complicated by dystocia, leading to an emergent C-section. She is not breastfeeding. Her C-section scar healed well but she did develop an abscess immediately below it that has now  improved to a hardened "knot" and is no longer painful.    Review of Systems  Constitutional: Positive for diaphoresis. Negative for fever and appetite change.  Respiratory: Positive for shortness of breath. Negative for cough, choking and chest tightness.   Cardiovascular: Positive for chest pain and palpitations.  Gastrointestinal: Positive for vomiting, diarrhea and constipation. Negative for abdominal pain.  Genitourinary: Positive for menstrual problem.  Neurological: Positive for dizziness. Negative for syncope.  Psychiatric/Behavioral: Positive for sleep disturbance. Negative for suicidal ideas, self-injury and agitation. The patient is not nervous/anxious and is not hyperactive.        Objective:   Physical Exam  Nursing note and vitals reviewed. Constitutional: She is oriented to person, place, and time. She appears well-developed and well-nourished. No distress.  Eyes: Conjunctivae are normal. No scleral icterus.  Neck: No thyromegaly present.  Cardiovascular: Normal rate and regular rhythm.   No murmur heard. Pulmonary/Chest: Effort normal and breath sounds normal. No respiratory distress. She has no wheezes. She has no rales. She exhibits no tenderness.  Abdominal: Soft. Bowel sounds are normal. There is no tenderness.  Musculoskeletal: She exhibits no edema.  Neurological: She is alert and oriented to person, place, and time.  Skin: Skin is warm and dry. She is not diaphoretic.  Psychiatric: She has a normal mood and affect. Her behavior is normal.          Assessment & Plan:

## 2012-06-04 NOTE — Assessment & Plan Note (Addendum)
  Assessment: Progress toward smoking cessation:  smoking less Barriers to progress toward smoking cessation:  lack of motivation to quit Comments: 18 pack/yr smoking history, cutting back.   Plan: Instruction/counseling given:  I counseled patient on the dangers of tobacco use and advised patient to stop smoking. Educational resources provided:  [facility] [employer] account manager) brochure Self management tools provided:  smoking cessation plan (STAR Quit Plan);smoking cessation journal/diary Medications to assist with smoking cessation:  None Patient agreed to the following self-care plans for smoking cessation: call [facility] (1-800-QUIT-NOW);go to the [employer] (https://scott-booker.info/)  Other plans: Patient will try journal/diary to distract from smoking habit, not ready to quit yet

## 2012-06-04 NOTE — Assessment & Plan Note (Signed)
She has had chest pain for 2-3 years that could be due to GERD. She denies food regurgitation but has had nausea. Her CP is not associated with food intake. She took TUMS during her pregnancy. Her urine pregnancy test today is negative, sent prescription for Protonix which she was instructed to take daily for one month and to follow up with [place] in one month.

## 2012-06-04 NOTE — Assessment & Plan Note (Addendum)
Chest pain as recent as the night prior to her visit. EKG today normal. Will treat with Protonix daily for one month with chest pain does not improve will consider cardiac stress test.

## 2012-06-04 NOTE — Assessment & Plan Note (Signed)
Lab Results  Component Value Date   HGBA1C 6.2 06/03/2012   HGBA1C 7.1 05/03/2011   HGBA1C 7.3 12/02/2008     Assessment: Diabetes control: good control (HgbA1C at goal) Progress toward A1C goal:  at goal Comments: She had gestational diabetes prior to the birth of her daughter on 12/14/11, prior HgbA1C of 7.1% while she was pregnant.   Plan: Medications:  no medications, diet controlled Home glucose monitoring: Frequency: no home glucose monitoring Timing:  NA Instruction/counseling given: discussed foot care and discussed the need for weight loss Educational resources provided: brochure Self management tools provided:  none Other plans: Patient encouraged to return in 3 months for diabetes follow up

## 2012-06-04 NOTE — Assessment & Plan Note (Addendum)
She is due for a PAP smear. She will call for an appointment in one month.  She is not on birth control method. She was given written information on different birth control options with discussion of each method. She wants to read about the different choices for now. She was informed that the Depot shots could be offered here but combination pill is contraindicated for her given her unstable chest pain and current smoking status. She was advised to follow with the [facility] for some of the other [facility] options.

## 2012-06-04 NOTE — Assessment & Plan Note (Signed)
>>  ASSESSMENT AND PLAN FOR DIABETES TYPE 2, UNCONTROLLED WRITTEN ON 06/04/2012 10:40 PM BY Ky Barban, MD  Lab Results  Component Value Date   HGBA1C 6.2 06/03/2012   HGBA1C 7.1 05/03/2011   HGBA1C 7.3 12/02/2008     Assessment: Diabetes control: good control (HgbA1C at goal) Progress toward A1C goal:  at goal Comments: She had gestational diabetes prior to the birth of her daughter on 12/14/11, prior HgbA1C of 7.1% while she was pregnant.   Plan: Medications:  no medications, diet controlled Home glucose monitoring: Frequency: no home glucose monitoring Timing:  NA Instruction/counseling given: discussed foot care and discussed the need for weight loss Educational resources provided: brochure Self management tools provided:  none Other plans: Patient encouraged to return in 3 months for diabetes follow up

## 2012-06-04 NOTE — Assessment & Plan Note (Addendum)
She states being tired with sleep deprivation as she cares for her infant daughter. She declines offer for sleep aid as she usually has to get up at night to tend to her daughter. She sleeps sitting up on the cough. She never had hobbies or activities of interest but reports feeling happy with her family support and with her daughter. She denies being depressed. 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 Her mother is ill with congestive heart failure but has been able to help her some.

## 2012-06-04 NOTE — Assessment & Plan Note (Signed)
Normal rate and rhythm on physical exam and by EKG. Will consider Cardiology referral if recurrent (possible event monitor/Holter monitor).  TSH f T4 normal. CBC with nl Hgb. Pregnancy urine test negative.

## 2012-06-04 NOTE — Assessment & Plan Note (Addendum)
Pt not on metformin at this time with HgbA1C of 6.2%, will continue healthy diet plan for now. Pregnancy test today negative.

## 2012-06-12 NOTE — Progress Notes (Signed)
TEACHING ATTENDING ADDENDUM: I discussed this case with Dr. Hayes Ludwig at the time of the patient visit. I agree with the HPI, exam findings and have read the documentation provided by the resident,  and I concur with the plan of care. Please see the resident note for details of management.

## 2012-07-11 ENCOUNTER — Other Ambulatory Visit: Payer: Self-pay

## 2012-08-30 ENCOUNTER — Encounter: Payer: Medicaid Other | Admitting: Internal Medicine

## 2012-09-16 ENCOUNTER — Encounter: Payer: Self-pay | Admitting: Internal Medicine

## 2012-09-16 ENCOUNTER — Ambulatory Visit (INDEPENDENT_AMBULATORY_CARE_PROVIDER_SITE_OTHER): Payer: Medicaid Other | Admitting: Internal Medicine

## 2012-09-16 VITALS — BP 128/70 | HR 77 | Temp 98.4°F | Ht 73.0 in | Wt 270.4 lb

## 2012-09-16 DIAGNOSIS — F17200 Nicotine dependence, unspecified, uncomplicated: Secondary | ICD-10-CM

## 2012-09-16 DIAGNOSIS — E119 Type 2 diabetes mellitus without complications: Secondary | ICD-10-CM

## 2012-09-16 DIAGNOSIS — K219 Gastro-esophageal reflux disease without esophagitis: Secondary | ICD-10-CM

## 2012-09-16 DIAGNOSIS — Z0000 Encounter for general adult medical examination without abnormal findings: Secondary | ICD-10-CM

## 2012-09-16 DIAGNOSIS — R079 Chest pain, unspecified: Secondary | ICD-10-CM

## 2012-09-16 DIAGNOSIS — Z23 Encounter for immunization: Secondary | ICD-10-CM

## 2012-09-16 DIAGNOSIS — Z720 Tobacco use: Secondary | ICD-10-CM

## 2012-09-16 LAB — LIPID PANEL
Cholesterol: 173 mg/dL (ref 0–200)
HDL: 28 mg/dL — ABNORMAL LOW (ref >39)
LDL Cholesterol: 89 mg/dL (ref 0–99)
Total CHOL/HDL Ratio: 6.2 Ratio
Triglycerides: 281 mg/dL — ABNORMAL HIGH (ref <150)
VLDL: 56 mg/dL — ABNORMAL HIGH (ref 0–40)

## 2012-09-16 LAB — POCT GLYCOSYLATED HEMOGLOBIN (HGB A1C): Hemoglobin A1C: 5.9

## 2012-09-16 MED ORDER — RANITIDINE HCL 150 MG PO TABS: 150.0000 mg | ORAL_TABLET | Freq: Two times a day (BID) | ORAL | Status: DC

## 2012-09-16 NOTE — Patient Instructions (Addendum)
General Instructions: -Continue working on smoking cessation.  -Call 1-800-QUIT-NOW for ideas on quitting smoking.  -Take Ranitidine twice per day for one month.  -Follow up in one month.    Treatment Goals:  Goals (1 Years of Data) as of 09/16/12         As of Today 06/03/12 04/12/12 04/11/12 12/16/11     Blood Pressure    . Blood Pressure < 130/80  128/70 110/75 140/90 150/88 118/77     Lifestyle    . Quit smoking / using tobacco   No        Result Component    . HEMOGLOBIN A1C < 7.0  5.9 6.2       . LDL CALC < 100            Progress Toward Treatment Goals:  Treatment Goal 09/16/2012  Hemoglobin A1C at goal  Stop smoking smoking less    Self Care Goals & Plans:  Self Care Goal 09/16/2012  Manage my medications take my medicines as prescribed; bring my medications to every visit; refill my medications on time  Monitor my health keep track of my weight  Eat healthy foods eat foods that are low in salt; eat baked foods instead of fried foods  Be physically active find an activity I enjoy  Stop smoking call [facility] (1-800-QUIT-NOW); go to the [employer] (https://scott-booker.info/); cut down the number of cigarettes smoked  Meeting treatment goals -    Home Blood Glucose Monitoring 09/16/2012  Check my blood sugar no home glucose monitoring     Care Management & Community Referrals:  Referral 09/16/2012  Referrals made for care management support none needed  Referrals made to community resources -     Smoking Cessation, Tips for Success YOU CAN QUIT SMOKING If you are ready to quit smoking, congratulations! You have chosen to help yourself be healthier. Cigarettes bring nicotine, tar, carbon monoxide, and other irritants into your body. Your lungs, heart, and blood vessels will be able to work better without these poisons. There are many different ways to quit smoking. Nicotine gum, nicotine patches, a nicotine inhaler, or nicotine nasal spray can help with physical  craving. Hypnosis, support groups, and medicines help break the habit of smoking. Here are some tips to help you quit for good.  Throw away all cigarettes.  Clean and remove all ashtrays from your home, work, and car.  On a card, write down your reasons for quitting. Carry the card with you and read it when you get the urge to smoke.  Cleanse your body of nicotine. Drink enough water and fluids to keep your urine clear or pale yellow. Do this after quitting to flush the nicotine from your body.  Learn to predict your moods. Do not let a bad situation be your excuse to have a cigarette. Some situations in your life might tempt you into wanting a cigarette.  Never have "just one" cigarette. It leads to wanting another and another. Remind yourself of your decision to quit.  Change habits associated with smoking. If you smoked while driving or when feeling stressed, try other activities to replace smoking. Stand up when drinking your coffee. Brush your teeth after eating. Sit in a different chair when you read the paper. Avoid alcohol while trying to quit, and try to drink fewer caffeinated beverages. Alcohol and caffeine may urge you to smoke.  Avoid foods and drinks that can trigger a desire to smoke, such as sugary or spicy foods  and alcohol.  Ask people who smoke not to smoke around you.  Have something planned to do right after eating or having a cup of coffee. Take a walk or exercise to perk you up. This will help to keep you from overeating.  Try a relaxation exercise to calm you down and decrease your stress. Remember, you may be tense and nervous for the first 2 weeks after you quit, but this will pass.  Find new activities to keep your hands busy. Play with a pen, coin, or rubber band. Doodle or draw things on paper.  Brush your teeth right after eating. This will help cut down on the craving for the taste of tobacco after meals. You can try mouthwash, too.  Use oral substitutes,  such as lemon drops, carrots, a cinnamon stick, or chewing gum, in place of cigarettes. Keep them handy so they are available when you have the urge to smoke.  When you have the urge to smoke, try deep breathing.  Designate your home as a nonsmoking area.  If you are a heavy smoker, ask your caregiver about a prescription for nicotine chewing gum. It can ease your withdrawal from nicotine.  Reward yourself. Set aside the cigarette money you save and buy yourself something nice.  Look for support from others. Join a support group or smoking cessation program. Ask someone at home or at work to help you with your plan to quit smoking.  Always ask yourself, "Do I need this cigarette or is this just a reflex?" Tell yourself, "Today, I choose not to smoke," or "I do not want to smoke." You are reminding yourself of your decision to quit, even if you do smoke a cigarette. HOW WILL I FEEL WHEN I QUIT SMOKING?  The benefits of not smoking start within days of quitting.  You may have symptoms of withdrawal because your body is used to nicotine (the addictive substance in cigarettes). You may crave cigarettes, be irritable, feel very hungry, cough often, get headaches, or have difficulty concentrating.  The withdrawal symptoms are only temporary. They are strongest when you first quit but will go away within 10 to 14 days.  When withdrawal symptoms occur, stay in control. Think about your reasons for quitting. Remind yourself that these are signs that your body is healing and getting used to being without cigarettes.  Remember that withdrawal symptoms are easier to treat than the major diseases that smoking can cause.  Even after the withdrawal is over, expect periodic urges to smoke. However, these cravings are generally short-lived and will go away whether you smoke or not. Do not smoke!  If you relapse and smoke again, do not lose hope. Most smokers quit 3 times before they are successful.  If  you relapse, do not give up! Plan ahead and think about what you will do the next time you get the urge to smoke. LIFE AS A NONSMOKER: MAKE IT FOR A MONTH, MAKE IT FOR LIFE Day 1: Hang this page where you will see it every day. Day 2: Get rid of all ashtrays, matches, and lighters. Day 3: Drink water. Breathe deeply between sips. Day 4: Avoid places with smoke-filled air, such as bars, clubs, or the smoking section of restaurants. Day 5: Keep track of how much money you save by not smoking. Day 6: Avoid boredom. Keep a good book with you or go to the movies. Day 7: Reward yourself! One week without smoking! Day 8: Make a dental appointment  to get your teeth cleaned. Day 9: Decide how you will turn down a cigarette before it is offered to you. Day 10: Review your reasons for quitting. Day 11: Distract yourself. Stay active to keep your mind off smoking and to relieve tension. Take a walk, exercise, read a book, do a crossword puzzle, or try a new hobby. Day 12: Exercise. Get off the bus before your stop or use stairs instead of escalators. Day 13: Call on friends for support and encouragement. Day 14: Reward yourself! Two weeks without smoking! Day 15: Practice deep breathing exercises. Day 16: Bet a friend that you can stay a nonsmoker. Day 17: Ask to sit in nonsmoking sections of restaurants. Day 18: Hang up "No Smoking" signs. Day 19: Think of yourself as a nonsmoker. Day 20: Each morning, tell yourself you will not smoke. Day 21: Reward yourself! Three weeks without smoking! Day 22: Think of smoking in negative ways. Remember how it stains your teeth, gives you bad breath, and leaves you short of breath. Day 23: Eat a nutritious breakfast. Day 24:Do not relive your days as a smoker. Day 25: Hold a pencil in your hand when talking on the telephone. Day 26: Tell all your friends you do not smoke. Day 27: Think about how much better food tastes. Day 28: Remember, one cigarette is one too  many. Day 29: Take up a hobby that will keep your hands busy. Day 30: Congratulations! One month without smoking! Give yourself a big reward. Your caregiver can direct you to community resources or hospitals for support, which may include:  Group support.  Education.  Hypnosis.  Subliminal therapy. Document Released: 09/17/2003 Document Revised: 03/13/2011 Document Reviewed: 10/05/2008 [facility] Patient Information 2014 [place], [place].

## 2012-09-17 LAB — MICROALBUMIN / CREATININE URINE RATIO
Creatinine, Urine: 184.3 mg/dL
Microalb Creat Ratio: 3.5 mg/g (ref 0.0–30.0)
Microalb, Ur: 0.64 mg/dL (ref 0.00–1.89)

## 2012-09-17 NOTE — Assessment & Plan Note (Signed)
>>  ASSESSMENT AND PLAN FOR DIABETES TYPE 2, UNCONTROLLED WRITTEN ON 09/17/2012  4:57 PM BY Ky Barban, MD  Lab Results  Component Value Date   HGBA1C 5.9 09/16/2012   HGBA1C 6.2 06/03/2012   HGBA1C 7.1 05/03/2011     Assessment: Diabetes control: good control (HgbA1C at goal) Progress toward A1C goal:  at goal Comments: Diet controlled  Plan: Medications:  continue diet control Home glucose monitoring: Frequency: no home glucose monitoring Timing:   Instruction/counseling given: discussed foot care Educational resources provided: brochure Self management tools provided:  (does not test at home) Other plans: She just had an eye exam and was told that the results were normal. Urine microalbumin ordered during this visit with normal result. Lipid panel ordered during this visit, resulted in LDL <100.

## 2012-09-17 NOTE — Assessment & Plan Note (Signed)
She could not afford Protonix as she does not have medical insurance. Her chest pain resolves with rest, not associated with food but will attempt a trial of H2 blocker. She will also apply for the [place] so a cardiac stress test can be ordered with referral to Cardiology as indicated.  -Rx Ranitidine 180m BID which is in the [facility] $4 list.  -Pt provided list of documents required for the [employer].

## 2012-09-17 NOTE — Assessment & Plan Note (Signed)
  Assessment: Progress toward smoking cessation:  smoking less Barriers to progress toward smoking cessation:  withdrawal symptoms;stress Comments: She is cutting back. Stress at work triggers her to smoke, she is smoking 3-4 cigarettes per day, down from 1 pack per day.   Plan: Instruction/counseling given:  I counseled patient on the dangers of tobacco use, advised patient to stop smoking, and reviewed strategies to maximize success. Educational resources provided:  [facility] Insurance account manager) brochure Self management tools provided:  smoking cessation plan (STAR Quit Plan) Medications to assist with smoking cessation:  None Patient agreed to the following self-care plans for smoking cessation: call [facility] (1-800-QUIT-NOW);go to the [employer] (www.quitlinenc.com);cut down the number of cigarettes smoked  Other plans: Provided list of tips for success of smoking cessation. Discussed other copying strategies for stress at work. Will continue providing smoking cessation counseling.

## 2012-09-17 NOTE — Assessment & Plan Note (Signed)
Flu vaccine given during this visit.

## 2012-09-17 NOTE — Progress Notes (Signed)
  Subjective:    Patient ID: Misty [name], female    DOB: [birth year], 32 y.o.   MRN: [id number]  Diabetes Pertinent negatives for hypoglycemia include no dizziness. Associated symptoms include chest pain. Pertinent negatives for diabetes include no fatigue.  Chest Pain  Associated symptoms include palpitations. Pertinent negatives include no abdominal pain, cough, dizziness, fever or shortness of breath.   [name] is a 32 year old woman with PMH of PCOS, DM2, and tobacco use who comes in for follow up visit. She continues to have intermittent heart palpitations and chest pain. She did not start taking Protonix as this medication was too costly and she only has Medicaid Family Planning that does not cover this medication.    Review of Systems  Constitutional: Negative for fever, chills, activity change, appetite change and fatigue.  Respiratory: Negative for cough and shortness of breath.   Cardiovascular: Positive for chest pain and palpitations. Negative for leg swelling.  Gastrointestinal: Negative for abdominal pain.  Genitourinary: Negative for dysuria.  Neurological: Negative for dizziness and light-headedness.  Psychiatric/Behavioral: Negative for behavioral problems and agitation.       Objective:   Physical Exam  Nursing note and vitals reviewed. Constitutional: She is oriented to person, place, and time. She appears well-developed and well-nourished. No distress.  Eyes: Conjunctivae are normal. No scleral icterus.  Cardiovascular: Normal rate and regular rhythm.  Exam reveals no gallop and no friction rub.   No murmur heard. Pulmonary/Chest: Effort normal and breath sounds normal. No respiratory distress. She has no wheezes. She has no rales.  Musculoskeletal: She exhibits no edema.  Neurological: She is alert and oriented to person, place, and time.  Skin: Skin is warm and dry. She is not diaphoretic.  Psychiatric: She has a normal mood and affect.  Her behavior is normal.          Assessment & Plan:

## 2012-09-17 NOTE — Assessment & Plan Note (Addendum)
Lab Results  Component Value Date   HGBA1C 5.9 09/16/2012   HGBA1C 6.2 06/03/2012   HGBA1C 7.1 05/03/2011     Assessment: Diabetes control: good control (HgbA1C at goal) Progress toward A1C goal:  at goal Comments: Diet controlled  Plan: Medications:  continue diet control Home glucose monitoring: Frequency: no home glucose monitoring Timing:   Instruction/counseling given: discussed foot care Educational resources provided: brochure Self management tools provided:  (does not test at home) Other plans: She just had an eye exam and was told that the results were normal. Urine microalbumin ordered during this visit with normal result. Lipid panel ordered during this visit, resulted in LDL <100.

## 2012-09-18 NOTE — Progress Notes (Signed)
Case discussed with Dr. Kennerly soon after the resident saw the patient.  We reviewed the resident's history and exam and pertinent patient test results.  I agree with the assessment, diagnosis, and plan of care documented in the resident's note. 

## 2012-10-18 ENCOUNTER — Telehealth: Payer: Self-pay | Admitting: Dietician

## 2012-10-18 NOTE — Telephone Encounter (Signed)
Patient thinks she may have gone to [facility] because the office was on [facility]. She is having difficulty seeing and needs glasses, but cannot afford the frames, was told Medicaid pays for lense but not frame. Will call for exam.

## 2012-10-21 ENCOUNTER — Encounter: Payer: Medicaid Other | Admitting: Internal Medicine

## 2012-10-22 NOTE — Telephone Encounter (Signed)
Left message at Dr. Payton Emerald office to fax results to our office

## 2012-10-23 ENCOUNTER — Ambulatory Visit: Payer: Medicaid Other

## 2012-10-23 NOTE — Telephone Encounter (Addendum)
Hecker's office does not know this patient.

## 2012-12-03 ENCOUNTER — Emergency Department (HOSPITAL_COMMUNITY)
Admission: EM | Admit: 2012-12-03 | Discharge: 2012-12-04 | Disposition: A | Discharge status: Home / Self Care | Payer: Medicaid Other | Attending: Emergency Medicine | Admitting: Emergency Medicine

## 2012-12-03 ENCOUNTER — Encounter (HOSPITAL_COMMUNITY): Payer: Self-pay | Admitting: Emergency Medicine

## 2012-12-03 DIAGNOSIS — E119 Type 2 diabetes mellitus without complications: Secondary | ICD-10-CM | POA: Insufficient documentation

## 2012-12-03 DIAGNOSIS — Z8719 Personal history of other diseases of the digestive system: Secondary | ICD-10-CM | POA: Insufficient documentation

## 2012-12-03 DIAGNOSIS — M549 Dorsalgia, unspecified: Secondary | ICD-10-CM

## 2012-12-03 DIAGNOSIS — M546 Pain in thoracic spine: Secondary | ICD-10-CM | POA: Insufficient documentation

## 2012-12-03 DIAGNOSIS — F17200 Nicotine dependence, unspecified, uncomplicated: Secondary | ICD-10-CM | POA: Insufficient documentation

## 2012-12-03 DIAGNOSIS — Z8742 Personal history of other diseases of the female genital tract: Secondary | ICD-10-CM | POA: Insufficient documentation

## 2012-12-03 DIAGNOSIS — R079 Chest pain, unspecified: Secondary | ICD-10-CM | POA: Insufficient documentation

## 2012-12-03 DIAGNOSIS — R209 Unspecified disturbances of skin sensation: Secondary | ICD-10-CM | POA: Insufficient documentation

## 2012-12-03 NOTE — ED Notes (Signed)
Pt. reports intermittent mid back and upper back pain with right hand tingling onset 1 week ago , denies injury or fall .

## 2012-12-03 NOTE — ED Notes (Signed)
The pt is at the desk now she reports she has been in peds and no one called her name in peds

## 2012-12-03 NOTE — ED Notes (Signed)
No answer x1

## 2012-12-04 ENCOUNTER — Emergency Department (HOSPITAL_COMMUNITY): Payer: Medicaid Other

## 2012-12-04 LAB — POCT I-STAT, CHEM 8
BUN: 7 mg/dL (ref 6–23)
Calcium, Ion: 1.15 mmol/L (ref 1.12–1.23)
Chloride: 106 mEq/L (ref 96–112)
HCT: 39 % (ref 36.0–46.0)
Hemoglobin: 13.3 g/dL (ref 12.0–15.0)
Potassium: 3.5 mEq/L (ref 3.5–5.1)
Sodium: 142 mEq/L (ref 135–145)
TCO2: 21 mmol/L (ref 0–100)

## 2012-12-04 LAB — POCT I-STAT TROPONIN I: Troponin i, poc: 0.01 ng/mL (ref 0.00–0.08)

## 2012-12-04 NOTE — ED Provider Notes (Signed)
CSN: [id number]     Arrival date & time 12/03/12  2020 History   First MD Initiated Contact with Patient 12/04/12 0002     Chief Complaint  Patient presents with  . Back Pain   (Consider location/radiation/quality/duration/timing/severity/associated sxs/prior Treatment) HPI Comments: Patient is a 32 year old female with a history of chest pain with multiple ED evaluations, diabetes mellitus, and esophageal reflux who presents for thoracic back pain. Patient states the pain has been intermittent over the past few weeks, but worsened today. She states the pain is stabbing in nature and radiates through to her anterior chest as well as upper back towards her neck and down her right arm. Patient denies any modifying factors of her symptoms. She endorses subjective tingling in her right arm. She denies associated fever, syncope, shortness of breath, N/V, abdominal pain, numbness and extremity weakness.  The history is provided by the patient. No language interpreter was used.    Past Medical History  Diagnosis Date  . PCOD (polycystic ovarian disease)     Followed by [facility], has been on metformin  . Chest pain     multiple ED evaluations, most likely musculoskeletal (cervical radiculopathy), needs MRI spine  . GERD (gastroesophageal reflux disease)   . Diabetes mellitus     pt states doesn't know if has DM  . Gestational diabetes    Past Surgical History  Procedure Laterality Date  . Cesarean section  12/14/2011    Procedure: CESAREAN SECTION;  Surgeon: Lahoma Crocker, MD;  Location: [facility];  Service: Obstetrics;  Laterality: N/A;  Primary Cesarean Section Delivery Girl @ [phone], Apgars   Family History  Problem Relation Age of Onset  . Diabetes Mother   . Hypertension Mother   . Heart disease Father   . Diabetes Sister   . Hypertension Sister   . Diabetes Brother   . Cancer Maternal Grandmother   . Cancer Paternal Grandmother    History  Substance Use Topics  . Smoking status:  Current Every Day Smoker -- 0.50 packs/day    Types: Cigarettes  . Smokeless tobacco: Not on file     Comment: thinking about quiting  . Alcohol Use: No   OB History   Grav Para Term Preterm Abortions TAB SAB Ect Mult Living   1 1 1  0 0 0 0 0 0 1     Review of Systems  Cardiovascular: Positive for chest pain.  Musculoskeletal: Positive for back pain.  Neurological: Negative for weakness and numbness.       +RUE tingling  All other systems reviewed and are negative.    Allergies  Review of patient's allergies indicates no known allergies.  Home Medications  No current outpatient prescriptions on file. BP 157/94  Pulse 88  Temp(Src) 98.4 F (36.9 C) (Oral)  Resp 20  SpO2 98%  Physical Exam  Nursing note and vitals reviewed. Constitutional: She is oriented to person, place, and time. She appears well-developed and well-nourished. No distress.  HENT:  Head: Normocephalic and atraumatic.  Mouth/Throat: Oropharynx is clear and moist. No oropharyngeal exudate.  Eyes: Conjunctivae and EOM are normal. Pupils are equal, round, and reactive to light. No scleral icterus.  Neck: Normal range of motion. Neck supple.  Cardiovascular: Normal rate, regular rhythm, normal heart sounds and intact distal pulses.   Pulmonary/Chest: Effort normal and breath sounds normal. No respiratory distress. She has no wheezes. She has no rales.  Abdominal: Soft. There is no tenderness. There is no rebound and no guarding.  Musculoskeletal: Normal range of motion.  +TTP of thoracic paraspinal muscles b/l. No bony deformities or step offs palpated.  Neurological: She is alert and oriented to person, place, and time.  Patient moves extremities without ataxia. Equal grip strength bilaterally with 5/5 strength against resistance in her upper extremities. Patient ambulatory with normal gait.  Skin: Skin is warm and dry. No rash noted. She is not diaphoretic. No erythema. No pallor.  Psychiatric: She has a  normal mood and affect. Her behavior is normal.    ED Course  Procedures (including critical care time) Labs Review Labs Reviewed  POCT I-STAT, CHEM 8 - Abnormal; Notable for the following:    Glucose, Bld 163 (*)    All other components within normal limits  POCT I-STAT TROPONIN I   Imaging Review Dg Chest 2 View  12/04/2012   CLINICAL DATA:  Chest pain  EXAM: CHEST  2 VIEW  COMPARISON:  Prior radiograph from 10/21/2010  FINDINGS: The cardiac and mediastinal silhouettes are stable in size and contour, and remain within normal limits.  Lung volumes are within normally limits. No focal infiltrate to suggest acute infectious pneumonitis identified. There is no pulmonary edema or pleural effusion. No pneumothorax identified.  Mild multilevel degenerative changes are seen within the visualized spine. No acute osseous abnormality identified. Visualized soft tissues are within normal limits.  IMPRESSION: No active cardiopulmonary disease.   Electronically Signed   By: Jeannine Boga M.D.   On: 12/04/2012 01:05    Date: 12/04/2012  Rate: 78  Rhythm: normal sinus rhythm  QRS Axis: normal  Intervals: normal  ST/T Wave abnormalities: nonspecific ST changes  Conduction Disutrbances:none  Narrative Interpretation: NSR; no STEMI or ischemic change  Old EKG Reviewed: unchanged from 06/03/2012 I have personally reviewed and interpreted this EKG   MDM   1. Back pain    Patient is a 32 year old female who presents for back pain. Patient radiating to her chest and up to her right shoulder with subjective tingling in her right upper extremity. Patient well and nontoxic appearing, hemodynamically stable, and afebrile. She endorses these symptoms for a number of weeks. Physical exam significant for tenderness to palpation of the thoracic paraspinal muscles. Cardiac workup today is negative for ACS; low suspicion given atypical nature of symptoms, chronic nature of symptoms, hemodynamic stability, and  reassuring work up and physical exam. Also doubt atypically presenting PE as patient without tachycardia, tachypnea, dyspnea, or hypoxia. Patient PERC negative.  Patient stable for discharge today with instruction to follow up with her primary care provider. Advised patient to take ibuprofen for symptom control. Return precautions advised and patient report to plan with no unaddressed concerns.   Antonietta Breach, PA-C 12/04/12 [phone]

## 2012-12-04 NOTE — ED Notes (Signed)
Patient transported to X-ray 

## 2012-12-04 NOTE — ED Provider Notes (Signed)
Medical screening examination/treatment/procedure(s) were performed by non-physician practitioner and as supervising physician I was immediately available for consultation/collaboration.  EKG Interpretation   None         Mariea Clonts, MD 12/04/12 [phone]

## 2013-02-04 ENCOUNTER — Encounter (HOSPITAL_COMMUNITY): Payer: Self-pay | Admitting: Emergency Medicine

## 2013-02-04 ENCOUNTER — Emergency Department (HOSPITAL_COMMUNITY)
Admission: EM | Admit: 2013-02-04 | Discharge: 2013-02-05 | Disposition: A | Discharge status: Home / Self Care | Payer: Medicaid Other | Attending: Emergency Medicine | Admitting: Emergency Medicine

## 2013-02-04 DIAGNOSIS — Z8632 Personal history of gestational diabetes: Secondary | ICD-10-CM | POA: Insufficient documentation

## 2013-02-04 DIAGNOSIS — IMO0002 Reserved for concepts with insufficient information to code with codable children: Secondary | ICD-10-CM | POA: Insufficient documentation

## 2013-02-04 DIAGNOSIS — E282 Polycystic ovarian syndrome: Secondary | ICD-10-CM | POA: Insufficient documentation

## 2013-02-04 DIAGNOSIS — Z8719 Personal history of other diseases of the digestive system: Secondary | ICD-10-CM | POA: Insufficient documentation

## 2013-02-04 DIAGNOSIS — F17200 Nicotine dependence, unspecified, uncomplicated: Secondary | ICD-10-CM | POA: Insufficient documentation

## 2013-02-04 DIAGNOSIS — L0291 Cutaneous abscess, unspecified: Secondary | ICD-10-CM

## 2013-02-04 MED ORDER — OXYCODONE-ACETAMINOPHEN 5-325 MG PO TABS
2.0000 | ORAL_TABLET | Freq: Once | ORAL | Status: AC
  Administered 2013-02-04: 2 via ORAL
  Filled 2013-02-04: qty 2

## 2013-02-04 MED ORDER — ONDANSETRON 4 MG PO TBDP
8.0000 mg | ORAL_TABLET | Freq: Once | ORAL | Status: AC
  Administered 2013-02-04: 8 mg via ORAL
  Filled 2013-02-04: qty 2

## 2013-02-04 NOTE — ED Notes (Signed)
Pt. reports worsening abscess at left axilla for several days with no drainage .

## 2013-02-04 NOTE — ED Provider Notes (Signed)
CSN: [id number]     Arrival date & time 02/04/13  2152 History   First MD Initiated Contact with Patient 02/04/13 2221     Chief Complaint  Patient presents with  . Abscess   (Consider location/radiation/quality/duration/timing/severity/associated sxs/prior Treatment) Patient is a 33 y.o. female presenting with abscess. The history is provided by the patient and medical records.  Abscess Location:  Shoulder/arm Shoulder/arm abscess location:  L axilla Size:  5x7 cm Abscess quality: fluctuance, induration, painful and redness   Red streaking: no   Duration:  4 days Progression:  Worsening Chronicity:  Recurrent Context: not diabetes, not immunosuppression, not injected drug use, not insect bite/sting and not skin injury   Associated symptoms: no anorexia, no fatigue, no fever, no headaches, no nausea and no vomiting   Risk factors: prior abscess     Past Medical History  Diagnosis Date  . PCOD (polycystic ovarian disease)     Followed by [facility], has been on metformin  . Chest pain     multiple ED evaluations, most likely musculoskeletal (cervical radiculopathy), needs MRI spine  . GERD (gastroesophageal reflux disease)   . Diabetes mellitus     pt states doesn't know if has DM  . Gestational diabetes    Past Surgical History  Procedure Laterality Date  . Cesarean section  12/14/2011    Procedure: CESAREAN SECTION;  Surgeon: Lahoma Crocker, MD;  Location: [facility];  Service: Obstetrics;  Laterality: N/A;  Primary Cesarean Section Delivery Girl @ [phone], Apgars   Family History  Problem Relation Age of Onset  . Diabetes Mother   . Hypertension Mother   . Heart disease Father   . Diabetes Sister   . Hypertension Sister   . Diabetes Brother   . Cancer Maternal Grandmother   . Cancer Paternal Grandmother    History  Substance Use Topics  . Smoking status: Current Every Day Smoker -- 0.50 packs/day    Types: Cigarettes  . Smokeless tobacco: Not on file     Comment: thinking  about quiting  . Alcohol Use: No   OB History   Grav Para Term Preterm Abortions TAB SAB Ect Mult Living   1 1 1  0 0 0 0 0 0 1     Review of Systems  Constitutional: Negative for fever and fatigue.  Gastrointestinal: Negative for nausea, vomiting and anorexia.  Skin:       Abscess   Neurological: Negative for headaches.    Allergies  Review of patient's allergies indicates no known allergies.  Home Medications   Current Outpatient Rx  Name  Route  Sig  Dispense  Refill  . acetaminophen (TYLENOL) 500 MG tablet   Oral   Take 1,500 mg by mouth daily as needed for mild pain.          BP 148/64  Pulse 83  Temp(Src) 98.8 F (37.1 C) (Oral)  Resp 14  Ht 6' 1"  (1.854 m)  Wt 279 lb (126.554 kg)  BMI 36.82 kg/m2  SpO2 98%  LMP 01/06/2013 Physical Exam  Constitutional: She is oriented to person, place, and time. She appears well-developed and well-nourished. No distress.  HENT:  Head: Normocephalic and atraumatic.  Eyes: Conjunctivae are normal. No scleral icterus.  Neck: Normal range of motion.  Cardiovascular: Normal rate, regular rhythm and normal heart sounds.  Exam reveals no gallop and no friction rub.   No murmur heard. Pulmonary/Chest: Effort normal and breath sounds normal. No respiratory distress.  Abdominal: Soft. Bowel sounds are  normal. She exhibits no distension and no mass. There is no tenderness. There is no guarding.  Neurological: She is alert and oriented to person, place, and time.  Skin: Skin is warm and dry. She is not diaphoretic.  Large fluctuant abscess of the L axilla, no surrounding erythema or streaking.    ED Course  Procedures (including critical care time) Labs Review Labs Reviewed - No data to display Imaging Review No results found.  EKG Interpretation   None      INCISION AND DRAINAGE Performed by: Margarita Mail Consent: Verbal consent obtained. Risks and benefits: risks, benefits and alternatives were discussed Type:  abscess  Body area: l Axilla  Anesthesia: local infiltration  Incision was made with a scalpel.  Local anesthetic: lidocaine 2% w epinephrine  Anesthetic total: 10 ml  Complexity: complex Blunt dissection to break up loculations  Drainage: purulent  Drainage amount: moderate  Patient tolerance: Patient tolerated the procedure well with no immediate complications.     MDM   1. Abscess    BP 148/64  Pulse 83  Temp(Src) 98.8 F (37.1 C) (Oral)  Resp 14  Ht 6' 1"  (1.854 m)  Wt 279 lb (126.554 kg)  BMI 36.82 kg/m2  SpO2 98%  LMP 01/06/2013  Abscess drained. No packing needed.  Discussed aftercare and return precautions. Wound check in 2 days. The patient appears reasonably screened and/or stabilized for discharge and I doubt any other medical condition or other [facility] requiring further screening, evaluation, or treatment in the ED at this time prior to discharge.     Margarita Mail, PA-C 02/05/13 0040

## 2013-02-05 MED ORDER — HYDROCODONE-ACETAMINOPHEN 5-325 MG PO TABS: 1.0000 | ORAL_TABLET | ORAL | Status: DC | PRN

## 2013-02-05 NOTE — Discharge Instructions (Signed)
Follow up with your PCP in 2 days for a wound check. Do not drive, operate heavy machinery, drink alcohol, or take other tylenol containing products with Norco.  Abscess  Care After  An abscess (also called a boil or furuncle) is an infected area that contains a collection of pus. Signs and symptoms of an abscess include pain, tenderness, redness, or hardness, or you may feel a moveable soft area under your skin. An abscess can occur anywhere in the body. The infection may spread to surrounding tissues causing cellulitis. A cut (incision) by the surgeon was made over your abscess and the pus was drained out. Gauze may have been packed into the space to provide a drain that will allow the cavity to heal from the inside outwards. The boil may be painful for 5 to 7 days. Most people with a boil do not have high fevers. Your abscess, if seen early, may not have localized, and may not have been lanced. If not, another appointment may be required for this if it does not get better on its own or with medications.  HOME CARE INSTRUCTIONS  Only take over-the-counter or prescription medicines for pain, discomfort, or fever as directed by your caregiver.  When you bathe, soak and then remove gauze or iodoform packs at least daily or as directed by your caregiver. You may then wash the wound gently with mild soapy water. Repack with gauze or do as your caregiver directs. SEEK IMMEDIATE MEDICAL CARE IF:  You develop increased pain, swelling, redness, drainage, or bleeding in the wound site.  You develop signs of generalized infection including muscle aches, chills, fever, or a general ill feeling.  An oral temperature above 102 F (38.9 C) develops, not controlled by medication. See your caregiver for a recheck if you develop any of the symptoms described above. If medications (antibiotics) were prescribed, take them as directed.  Document Released: 07/07/2004 Document Revised: 03/13/2011 Document Reviewed:  03/04/2007  [facility] Patient Information 2014 [place].   Abscess An abscess is an infected area that contains a collection of pus and debris.It can occur in almost any part of the body. An abscess is also known as a furuncle or boil. CAUSES  An abscess occurs when tissue gets infected. This can occur from blockage of oil or sweat glands, infection of hair follicles, or a minor injury to the skin. As the body tries to fight the infection, pus collects in the area and creates pressure under the skin. This pressure causes pain. People with weakened immune systems have difficulty fighting infections and get certain abscesses more often.  SYMPTOMS Usually an abscess develops on the skin and becomes a painful mass that is red, warm, and tender. If the abscess forms under the skin, you may feel a moveable soft area under the skin. Some abscesses break open (rupture) on their own, but most will continue to get worse without care. The infection can spread deeper into the body and eventually into the bloodstream, causing you to feel ill.  DIAGNOSIS  Your caregiver will take your medical history and perform a physical exam. A sample of fluid may also be taken from the abscess to determine what is causing your infection. TREATMENT  Your caregiver may prescribe antibiotic medicines to fight the infection. However, taking antibiotics alone usually does not cure an abscess. Your caregiver may need to make a small cut (incision) in the abscess to drain the pus. In some cases, gauze is packed into the abscess to  reduce pain and to continue draining the area. HOME CARE INSTRUCTIONS   Only take over-the-counter or prescription medicines for pain, discomfort, or fever as directed by your caregiver.  If you were prescribed antibiotics, take them as directed. Finish them even if you start to feel better.  If gauze is used, follow your caregiver's directions for changing the gauze.  To avoid spreading the  infection:  Keep your draining abscess covered with a bandage.  Wash your hands well.  Do not share personal care items, towels, or whirlpools with others.  Avoid skin contact with others.  Keep your skin and clothes clean around the abscess.  Keep all follow-up appointments as directed by your caregiver. SEEK MEDICAL CARE IF:   You have increased pain, swelling, redness, fluid drainage, or bleeding.  You have muscle aches, chills, or a general ill feeling.  You have a fever. MAKE SURE YOU:   Understand these instructions.  Will watch your condition.  Will get help right away if you are not doing well or get worse. Document Released: 09/28/2004 Document Revised: 06/20/2011 Document Reviewed: 03/03/2011 [facility] Patient Information 2014 [place].   Hidradenitis Suppurativa, Sweat Gland Abscess Hidradenitis suppurativa is a long lasting (chronic), uncommon disease of the sweat glands. With this, boil-like lumps and scarring develop in the groin, some times under the arms (axillae), and under the breasts. It may also uncommonly occur behind the ears, in the crease of the buttocks, and around the genitals.  CAUSES  The cause is from a blocking of the sweat glands. They then become infected. It may cause drainage and odor. It is not contagious. So it cannot be given to someone else. It most often shows up in puberty (about 52 to 35 years of age). But it may happen much later. It is similar to acne which is a disease of the sweat glands. This condition is slightly more common in African-Americans and women. SYMPTOMS   Hidradenitis usually starts as one or more red, tender, swellings in the groin or under the arms (axilla).  Over a period of hours to days the lesions get larger. They often open to the skin surface, draining clear to yellow-colored fluid.  The infected area heals with scarring. DIAGNOSIS  Your caregiver makes this diagnosis by looking at you. Sometimes  cultures (growing germs on plates in the lab) may be taken. This is to see what germ (bacterium) is causing the infection.  TREATMENT   Topical germ killing medicine applied to the skin (antibiotics) are the treatment of choice. Antibiotics taken by mouth (systemic) are sometimes needed when the condition is getting worse or is severe.  Avoid tight-fitting clothing which traps moisture in.  Dirt does not cause hidradenitis and it is not caused by poor hygiene.  Involved areas should be cleaned daily using an antibacterial soap. Some patients find that the liquid form of Lever 2000, applied to the involved areas as a lotion after bathing, can help reduce the odor related to this condition.  Sometimes surgery is needed to drain infected areas or remove scarred tissue. Removal of large amounts of tissue is used only in severe cases.  Birth control pills may be helpful.  Oral retinoids (vitamin A derivatives) for 6 to 12 months which are effective for acne may also help this condition.  Weight loss will improve but not cure hidradenitis. It is made worse by being overweight. But the condition is not caused by being overweight.  This condition is more common in  people who have had acne.  It may become worse under stress. There is no medical cure for hidradenitis. It can be controlled, but not cured. The condition usually continues for years with periods of getting worse and getting better (remission). Document Released: 08/03/2003 Document Revised: 03/13/2011 Document Reviewed: 08/19/2007 [facility] Patient Information 2014 [place].

## 2013-02-05 NOTE — ED Notes (Signed)
Provider at bedside draining abscess.

## 2013-02-05 NOTE — ED Provider Notes (Signed)
Medical screening examination/treatment/procedure(s) were performed by non-physician practitioner and as supervising physician I was immediately available for consultation/collaboration.  EKG Interpretation   None        Varney Biles, MD 02/05/13 [phone]

## 2013-04-22 ENCOUNTER — Emergency Department (HOSPITAL_COMMUNITY)
Admission: EM | Admit: 2013-04-22 | Discharge: 2013-04-22 | Disposition: A | Discharge status: Home / Self Care | Payer: Medicaid Other | Attending: Emergency Medicine | Admitting: Emergency Medicine

## 2013-04-22 DIAGNOSIS — Z8632 Personal history of gestational diabetes: Secondary | ICD-10-CM | POA: Insufficient documentation

## 2013-04-22 DIAGNOSIS — R7309 Other abnormal glucose: Secondary | ICD-10-CM | POA: Insufficient documentation

## 2013-04-22 DIAGNOSIS — K219 Gastro-esophageal reflux disease without esophagitis: Secondary | ICD-10-CM | POA: Insufficient documentation

## 2013-04-22 DIAGNOSIS — F17200 Nicotine dependence, unspecified, uncomplicated: Secondary | ICD-10-CM | POA: Insufficient documentation

## 2013-04-22 DIAGNOSIS — R739 Hyperglycemia, unspecified: Secondary | ICD-10-CM

## 2013-04-22 DIAGNOSIS — E119 Type 2 diabetes mellitus without complications: Secondary | ICD-10-CM | POA: Insufficient documentation

## 2013-04-22 DIAGNOSIS — M549 Dorsalgia, unspecified: Secondary | ICD-10-CM

## 2013-04-22 DIAGNOSIS — E282 Polycystic ovarian syndrome: Secondary | ICD-10-CM | POA: Insufficient documentation

## 2013-04-22 DIAGNOSIS — R109 Unspecified abdominal pain: Secondary | ICD-10-CM | POA: Insufficient documentation

## 2013-04-22 DIAGNOSIS — R079 Chest pain, unspecified: Secondary | ICD-10-CM | POA: Insufficient documentation

## 2013-04-22 LAB — URINALYSIS, ROUTINE W REFLEX MICROSCOPIC
Bilirubin Urine: NEGATIVE
Hgb urine dipstick: NEGATIVE
Ketones, ur: NEGATIVE mg/dL
LEUKOCYTES UA: NEGATIVE
NITRITE: NEGATIVE
PH: 6 (ref 5.0–8.0)
PROTEIN: NEGATIVE mg/dL
Specific Gravity, Urine: 1.039 — ABNORMAL HIGH (ref 1.005–1.030)
Urobilinogen, UA: 1 mg/dL (ref 0.0–1.0)

## 2013-04-22 LAB — I-STAT CHEM 8, ED
BUN: 3 mg/dL — AB (ref 6–23)
CALCIUM ION: 1.16 mmol/L (ref 1.12–1.23)
CREATININE: 0.6 mg/dL (ref 0.50–1.10)
Chloride: 104 mEq/L (ref 96–112)
Glucose, Bld: 181 mg/dL — ABNORMAL HIGH (ref 70–99)
HCT: 40 % (ref 36.0–46.0)
HEMOGLOBIN: 13.6 g/dL (ref 12.0–15.0)
Potassium: 3.7 mEq/L (ref 3.7–5.3)
Sodium: 141 mEq/L (ref 137–147)
TCO2: 25 mmol/L (ref 0–100)

## 2013-04-22 LAB — URINE MICROSCOPIC-ADD ON

## 2013-04-22 LAB — POC URINE PREG, ED: Preg Test, Ur: NEGATIVE

## 2013-04-22 MED ORDER — IBUPROFEN 800 MG PO TABS
800.0000 mg | ORAL_TABLET | Freq: Once | ORAL | Status: AC
  Administered 2013-04-22: 800 mg via ORAL
  Filled 2013-04-22: qty 1

## 2013-04-22 MED ORDER — CYCLOBENZAPRINE HCL 10 MG PO TABS: 10.0000 mg | ORAL_TABLET | Freq: Three times a day (TID) | ORAL | Status: DC | PRN

## 2013-04-22 MED ORDER — IBUPROFEN 800 MG PO TABS: 800.0000 mg | ORAL_TABLET | Freq: Three times a day (TID) | ORAL | Status: DC | PRN

## 2013-04-22 NOTE — ED Notes (Signed)
Pt reports lower back pain more R sided.  Pt states she thinks she may have a kidney infection.

## 2013-04-22 NOTE — ED Provider Notes (Signed)
CSN: [id number]     Arrival date & time 04/22/13  1131 History   First MD Initiated Contact with Patient 04/22/13 1143     Chief Complaint  Patient presents with  . Back Pain     (Consider location/radiation/quality/duration/timing/severity/associated sxs/prior Treatment) The history is provided by the patient.    Pt with right flank pain that feels like pressure, 9/10 intensity, worse with walking and movement.  Denies heavy lifting, falls, or injury.  Denies fevers, urinary symptoms, N/V/D, change in bowel habits, vaginal discharge or bleeding.  No weakness or numbness of the lower extremities. No fevers, no recent illness.  Has taken nothing for the pain.   Past Medical History  Diagnosis Date  . PCOD (polycystic ovarian disease)     Followed by [facility], has been on metformin  . Chest pain     multiple ED evaluations, most likely musculoskeletal (cervical radiculopathy), needs MRI spine  . GERD (gastroesophageal reflux disease)   . Diabetes mellitus     pt states doesn't know if has DM  . Gestational diabetes    Past Surgical History  Procedure Laterality Date  . Cesarean section  12/14/2011    Procedure: CESAREAN SECTION;  Surgeon: Lahoma Crocker, MD;  Location: [facility];  Service: Obstetrics;  Laterality: N/A;  Primary Cesarean Section Delivery Girl @ [phone], Apgars   Family History  Problem Relation Age of Onset  . Diabetes Mother   . Hypertension Mother   . Heart disease Father   . Diabetes Sister   . Hypertension Sister   . Diabetes Brother   . Cancer Maternal Grandmother   . Cancer Paternal Grandmother    History  Substance Use Topics  . Smoking status: Current Every Day Smoker -- 0.50 packs/day    Types: Cigarettes  . Smokeless tobacco: Not on file     Comment: thinking about quiting  . Alcohol Use: No   OB History   Grav Para Term Preterm Abortions TAB SAB Ect Mult Living   1 1 1  0 0 0 0 0 0 1     Review of Systems  Constitutional: Negative for fever.    Respiratory: Negative for cough and shortness of breath.   Cardiovascular: Negative for chest pain.  Gastrointestinal: Negative for nausea, vomiting, abdominal pain and diarrhea.  Genitourinary: Negative for dysuria, urgency, frequency, vaginal bleeding and vaginal discharge.  All other systems reviewed and are negative.     Allergies  Review of patient's allergies indicates no known allergies.  Home Medications   Prior to Admission medications   Medication Sig Start Date End Date Taking? Authorizing Provider  acetaminophen (TYLENOL) 500 MG tablet Take 1,500 mg by mouth daily as needed for mild pain.    Historical Provider, MD  HYDROcodone-acetaminophen (NORCO) 5-325 MG per tablet Take 1-2 tablets by mouth every 4 (four) hours as needed. 02/05/13   Abigail Harris, PA-C   BP 129/56  Pulse 83  Temp(Src) 98.4 F (36.9 C) (Oral)  Resp 16  Ht 6' 1"  (1.854 m)  Wt 279 lb 1 oz (126.582 kg)  BMI 36.83 kg/m2  SpO2 96% Physical Exam  Nursing note and vitals reviewed. Constitutional: She appears well-developed and well-nourished. No distress.  HENT:  Head: Normocephalic and atraumatic.  Neck: Neck supple.  Cardiovascular: Normal rate and regular rhythm.   Pulmonary/Chest: Effort normal and breath sounds normal. No respiratory distress. She has no wheezes. She has no rales.  Abdominal: Soft. She exhibits no distension. There is no tenderness. There is  no rebound and no guarding.  Musculoskeletal:       Back:  Spine nontender, no crepitus, or stepoffs.   Neurological: She is alert. She has normal strength. No sensory deficit.  Skin: She is not diaphoretic.    ED Course  Procedures (including critical care time) Labs Review Labs Reviewed  URINALYSIS, ROUTINE W REFLEX MICROSCOPIC - Abnormal; Notable for the following:    Specific Gravity, Urine 1.039 (*)    Glucose, UA >1000 (*)    All other components within normal limits  I-STAT CHEM 8, ED - Abnormal; Notable for the following:     BUN 3 (*)    Glucose, Bld 181 (*)    All other components within normal limits  URINE MICROSCOPIC-ADD ON  POC URINE PREG, ED    Imaging Review No results found.   EKG Interpretation None      MDM   Final diagnoses:  Hyperglycemia  Back pain    Right sided mid-low back pain without associated symptoms.  No injury.  Pt has hx gestational diabetes, blood glucose is elevated but pt has eaten today, not diagnostic for diabetes.  Pt advised to follow closely with PCP.  Suspect musculoskeletal back pain, no radicular symptoms.  Neurovascularly intact.  No red flags.  D/C home with ibuprofen and flexeril.  PCP follow up.  Discussed result, findings, treatment, and follow up  with patient.  Pt given return precautions.  Pt verbalizes understanding and agrees with plan.        Clayton Bibles, PA-C 04/22/13 1415

## 2013-04-22 NOTE — ED Provider Notes (Signed)
Medical screening examination/treatment/procedure(s) were performed by non-physician practitioner and as supervising physician I was immediately available for consultation/collaboration.   EKG Interpretation None        Delice Bison Ward, DO 04/22/13 1553

## 2013-04-22 NOTE — Discharge Instructions (Signed)
Read the information below.  Use the prescribed medication as directed.  Please discuss all new medications with your pharmacist.  You may return to the Emergency Department at any time for worsening condition or any new symptoms that concern you.  If there is any possibility that you might be pregnant or breastfeeding, please let your health care provider know and discuss this with the pharmacist to ensure medication safety.   If you develop fevers, loss of control of bowel or bladder, weakness or numbness in your legs, or are unable to walk, return to the ER for a recheck.    Back Pain, Adult Low back pain is very common. About 1 in 5 people have back pain.The cause of low back pain is rarely dangerous. The pain often gets better over time.About half of people with a sudden onset of back pain feel better in just 2 weeks. About 8 in 10 people feel better by 6 weeks.  CAUSES Some common causes of back pain include:  Strain of the muscles or ligaments supporting the spine.  Wear and tear (degeneration) of the spinal discs.  Arthritis.  Direct injury to the back. DIAGNOSIS Most of the time, the direct cause of low back pain is not known.However, back pain can be treated effectively even when the exact cause of the pain is unknown.Answering your caregiver's questions about your overall health and symptoms is one of the most accurate ways to make sure the cause of your pain is not dangerous. If your caregiver needs more information, he or she may order lab work or imaging tests (X-rays or MRIs).However, even if imaging tests show changes in your back, this usually does not require surgery. HOME CARE INSTRUCTIONS For many people, back pain returns.Since low back pain is rarely dangerous, it is often a condition that people can learn to [facility] their own.   Remain active. It is stressful on the back to sit or stand in one place. Do not sit, drive, or stand in one place for more than 30 minutes  at a time. Take short walks on level surfaces as soon as pain allows.Try to increase the length of time you walk each day.  Do not stay in bed.Resting more than 1 or 2 days can delay your recovery.  Do not avoid exercise or work.Your body is made to move.It is not dangerous to be active, even though your back may hurt.Your back will likely heal faster if you return to being active before your pain is gone.  Pay attention to your body when you bend and lift. Many people have less discomfortwhen lifting if they bend their knees, keep the load close to their bodies,and avoid twisting. Often, the most comfortable positions are those that put less stress on your recovering back.  Find a comfortable position to sleep. Use a firm mattress and lie on your side with your knees slightly bent. If you lie on your back, put a pillow under your knees.  Only take over-the-counter or prescription medicines as directed by your caregiver. Over-the-counter medicines to reduce pain and inflammation are often the most helpful.Your caregiver may prescribe muscle relaxant drugs.These medicines help dull your pain so you can more quickly return to your normal activities and healthy exercise.  Put ice on the injured area.  Put ice in a plastic bag.  Place a towel between your skin and the bag.  Leave the ice on for 15-20 minutes, 03-04 times a day for the first 2 to 3  days. After that, ice and heat may be alternated to reduce pain and spasms.  Ask your caregiver about trying back exercises and gentle massage. This may be of some benefit.  Avoid feeling anxious or stressed.Stress increases muscle tension and can worsen back pain.It is important to recognize when you are anxious or stressed and learn ways to manage it.Exercise is a great option. SEEK MEDICAL CARE IF:  You have pain that is not relieved with rest or medicine.  You have pain that does not improve in 1 week.  You have new  symptoms.  You are generally not feeling well. SEEK IMMEDIATE MEDICAL CARE IF:   You have pain that radiates from your back into your legs.  You develop new bowel or bladder control problems.  You have unusual weakness or numbness in your arms or legs.  You develop nausea or vomiting.  You develop abdominal pain.  You feel faint. Document Released: 12/19/2004 Document Revised: 06/20/2011 Document Reviewed: 05/09/2010 [facility] Patient Information 2014 [place], [place].  Back Exercises Back exercises help treat and prevent back injuries. The goal of back exercises is to increase the strength of your abdominal and back muscles and the flexibility of your back. These exercises should be started when you no longer have back pain. Back exercises include:  Pelvic Tilt. Lie on your back with your knees bent. Tilt your pelvis until the lower part of your back is against the floor. Hold this position 5 to 10 sec and repeat 5 to 10 times.  Knee to Chest. Pull first 1 knee up against your chest and hold for 20 to 30 seconds, repeat this with the other knee, and then both knees. This may be done with the other leg straight or bent, whichever feels better.  Sit-Ups or Curl-Ups. Bend your knees 90 degrees. Start with tilting your pelvis, and do a partial, slow sit-up, lifting your trunk only 30 to 45 degrees off the floor. Take at least 2 to 3 seconds for each sit-up. Do not do sit-ups with your knees out straight. If partial sit-ups are difficult, simply do the above but with only tightening your abdominal muscles and holding it as directed.  Hip-Lift. Lie on your back with your knees flexed 90 degrees. Push down with your feet and shoulders as you raise your hips a couple inches off the floor; hold for 10 seconds, repeat 5 to 10 times.  Back arches. Lie on your stomach, propping yourself up on bent elbows. Slowly press on your hands, causing an arch in your low back. Repeat 3 to 5 times. Any initial  stiffness and discomfort should lessen with repetition over time.  Shoulder-Lifts. Lie face down with arms beside your body. Keep hips and torso pressed to floor as you slowly lift your head and shoulders off the floor. Do not overdo your exercises, especially in the beginning. Exercises may cause you some mild back discomfort which lasts for a few minutes; however, if the pain is more severe, or lasts for more than 15 minutes, do not continue exercises until you see your caregiver. Improvement with exercise therapy for back problems is slow.  See your caregivers for assistance with developing a proper back exercise program. Document Released: 01/27/2004 Document Revised: 03/13/2011 Document Reviewed: 10/20/2010 [facility] Patient Information 2014 [place].  Hyperglycemia Hyperglycemia occurs when the glucose (sugar) in your blood is too high. Hyperglycemia can happen for many reasons, but it most often happens to people who do not know they have diabetes  or are not managing their diabetes properly.  CAUSES  Whether you have diabetes or not, there are other causes of hyperglycemia. Hyperglycemia can occur when you have diabetes, but it can also occur in other situations that you might not be as aware of, such as: Diabetes  If you have diabetes and are having problems controlling your blood glucose, hyperglycemia could occur because of some of the following reasons:  Not following your meal plan.  Not taking your diabetes medications or not taking it properly.  Exercising less or doing less activity than you normally do.  Being sick. Pre-diabetes  This cannot be ignored. Before people develop Type 2 diabetes, they almost always have "pre-diabetes." This is when your blood glucose levels are higher than normal, but not yet high enough to be diagnosed as diabetes. Research has shown that some long-term damage to the body, especially the heart and circulatory system, may already be  occurring during pre-diabetes. If you take action to manage your blood glucose when you have pre-diabetes, you may delay or prevent Type 2 diabetes from developing. Stress  If you have diabetes, you may be "diet" controlled or on oral medications or insulin to control your diabetes. However, you may find that your blood glucose is higher than usual in the hospital whether you have diabetes or not. This is often referred to as "stress hyperglycemia." Stress can elevate your blood glucose. This happens because of hormones put out by the body during times of stress. If stress has been the cause of your high blood glucose, it can be followed regularly by your caregiver. That way he/she can make sure your hyperglycemia does not continue to get worse or progress to diabetes. Steroids  Steroids are medications that act on the infection fighting system (immune system) to block inflammation or infection. One side effect can be a rise in blood glucose. Most people can produce enough extra insulin to allow for this rise, but for those who cannot, steroids make blood glucose levels go even higher. It is not unusual for steroid treatments to "uncover" diabetes that is developing. It is not always possible to determine if the hyperglycemia will go away after the steroids are stopped. A special blood test called an A1c is sometimes done to determine if your blood glucose was elevated before the steroids were started. SYMPTOMS  Thirsty.  Frequent urination.  Dry mouth.  Blurred vision.  Tired or fatigue.  Weakness.  Sleepy.  Tingling in feet or leg. DIAGNOSIS  Diagnosis is made by monitoring blood glucose in one or all of the following ways:  A1c test. This is a chemical found in your blood.  Fingerstick blood glucose monitoring.  Laboratory results. TREATMENT  First, knowing the cause of the hyperglycemia is important before the hyperglycemia can be treated. Treatment may include, but is not be  limited to:  Education.  Change or adjustment in medications.  Change or adjustment in meal plan.  Treatment for an illness, infection, etc.  More frequent blood glucose monitoring.  Change in exercise plan.  Decreasing or stopping steroids.  Lifestyle changes. HOME CARE INSTRUCTIONS   Test your blood glucose as directed.  Exercise regularly. Your caregiver will give you instructions about exercise. Pre-diabetes or diabetes which comes on with stress is helped by exercising.  Eat wholesome, balanced meals. Eat often and at regular, fixed times. Your caregiver or nutritionist will give you a meal plan to guide your sugar intake.  Being at an ideal weight is important.  If needed, losing as little as 10 to 15 pounds may help improve blood glucose levels. SEEK MEDICAL CARE IF:   You have questions about medicine, activity, or diet.  You continue to have symptoms (problems such as increased thirst, urination, or weight gain). SEEK IMMEDIATE MEDICAL CARE IF:   You are vomiting or have diarrhea.  Your breath smells fruity.  You are breathing faster or slower.  You are very sleepy or incoherent.  You have numbness, tingling, or pain in your feet or hands.  You have chest pain.  Your symptoms get worse even though you have been following your caregiver's orders.  If you have any other questions or concerns. Document Released: 06/14/2000 Document Revised: 03/13/2011 Document Reviewed: 04/17/2011 [facility] Patient Information 2014 [place], [place].

## 2013-05-01 ENCOUNTER — Ambulatory Visit: Payer: Medicaid Other | Admitting: Internal Medicine

## 2013-09-24 ENCOUNTER — Ambulatory Visit (INDEPENDENT_AMBULATORY_CARE_PROVIDER_SITE_OTHER): Payer: Self-pay | Admitting: *Deleted

## 2013-09-24 DIAGNOSIS — Z299 Encounter for prophylactic measures, unspecified: Secondary | ICD-10-CM

## 2013-09-26 LAB — TB SKIN TEST
INDURATION: 0 mm
TB Skin Test: NEGATIVE

## 2013-10-02 ENCOUNTER — Telehealth: Payer: Self-pay | Admitting: *Deleted

## 2013-10-02 NOTE — Telephone Encounter (Signed)
Message copied by Ebbie Latus on Thu Oct 02, 2013 10:11 AM ------      Message from: Annia Belt      Created: Tue Sep 30, 2013  6:07 PM       Call pt:  Skin test for TB negative ------

## 2013-10-02 NOTE — Telephone Encounter (Signed)
Called pt - no answer; left message that TB test negative and if she has any questions to call [facility].

## 2013-11-03 ENCOUNTER — Encounter (HOSPITAL_COMMUNITY): Payer: Self-pay | Admitting: Emergency Medicine

## 2014-01-28 ENCOUNTER — Encounter: Payer: Self-pay | Admitting: Internal Medicine

## 2014-02-05 ENCOUNTER — Telehealth: Payer: Self-pay | Admitting: Pediatrics

## 2014-02-05 DIAGNOSIS — Z207 Contact with and (suspected) exposure to pediculosis, acariasis and other infestations: Secondary | ICD-10-CM

## 2014-02-05 DIAGNOSIS — Z2089 Contact with and (suspected) exposure to other communicable diseases: Secondary | ICD-10-CM

## 2014-02-05 MED ORDER — PERMETHRIN 5 % EX CREA: 1.0000 "application " | TOPICAL_CREAM | Freq: Once | CUTANEOUS | Status: DC

## 2014-02-05 NOTE — Telephone Encounter (Signed)
Household contact was seen today for scabies.  The entire household is being treated for scabies with permethrin cream.  Rx sent to the pharmacy.

## 2014-02-17 ENCOUNTER — Telehealth: Payer: Self-pay | Admitting: Internal Medicine

## 2014-02-17 NOTE — Telephone Encounter (Signed)
Call to patient to confirm appointment for 02/18/14 at 2:15 lmtcb

## 2014-02-18 ENCOUNTER — Encounter: Payer: Self-pay | Admitting: Internal Medicine

## 2014-02-18 ENCOUNTER — Ambulatory Visit: Payer: Medicaid Other | Admitting: Internal Medicine

## 2014-05-15 ENCOUNTER — Encounter: Payer: Self-pay | Admitting: *Deleted

## 2014-08-11 ENCOUNTER — Encounter: Payer: Self-pay | Admitting: Internal Medicine

## 2014-08-11 ENCOUNTER — Telehealth: Payer: Self-pay | Admitting: Internal Medicine

## 2014-08-11 NOTE — Telephone Encounter (Signed)
Attempted to contact patient today about scheduling a future appt, but no answer.  Left message asking her to call me back.  I will also mail her a letter requesting the same.  Per Tamela Oddi her name will fall off your list if she does not call back to schedule an appt before her two years are up which will be in 09-2014.

## 2014-08-11 NOTE — Telephone Encounter (Signed)
Thanks, just going through my panel and checking off the patients I haven't seen.

## 2014-08-12 ENCOUNTER — Ambulatory Visit (INDEPENDENT_AMBULATORY_CARE_PROVIDER_SITE_OTHER): Payer: Medicaid Other | Admitting: Internal Medicine

## 2014-08-12 ENCOUNTER — Encounter: Payer: Self-pay | Admitting: Internal Medicine

## 2014-08-12 VITALS — BP 132/68 | HR 80 | Temp 98.5°F | Ht 73.0 in | Wt 269.8 lb

## 2014-08-12 DIAGNOSIS — E1165 Type 2 diabetes mellitus with hyperglycemia: Secondary | ICD-10-CM

## 2014-08-12 DIAGNOSIS — F17210 Nicotine dependence, cigarettes, uncomplicated: Secondary | ICD-10-CM | POA: Diagnosis not present

## 2014-08-12 DIAGNOSIS — R079 Chest pain, unspecified: Secondary | ICD-10-CM

## 2014-08-12 DIAGNOSIS — Z0000 Encounter for general adult medical examination without abnormal findings: Secondary | ICD-10-CM

## 2014-08-12 DIAGNOSIS — E282 Polycystic ovarian syndrome: Secondary | ICD-10-CM

## 2014-08-12 DIAGNOSIS — E119 Type 2 diabetes mellitus without complications: Secondary | ICD-10-CM | POA: Diagnosis present

## 2014-08-12 DIAGNOSIS — Z720 Tobacco use: Secondary | ICD-10-CM

## 2014-08-12 DIAGNOSIS — R0789 Other chest pain: Secondary | ICD-10-CM

## 2014-08-12 DIAGNOSIS — IMO0002 Reserved for concepts with insufficient information to code with codable children: Secondary | ICD-10-CM

## 2014-08-12 DIAGNOSIS — K219 Gastro-esophageal reflux disease without esophagitis: Secondary | ICD-10-CM

## 2014-08-12 LAB — POCT GLYCOSYLATED HEMOGLOBIN (HGB A1C): HEMOGLOBIN A1C: 9.9

## 2014-08-12 LAB — POCT URINALYSIS DIPSTICK
BILIRUBIN UA: NEGATIVE
GLUCOSE UA: 500
KETONES UA: NEGATIVE
LEUKOCYTES UA: NEGATIVE
Nitrite, UA: NEGATIVE
Protein, UA: NEGATIVE
SPEC GRAV UA: 1.015
UROBILINOGEN UA: 1
pH, UA: 6

## 2014-08-12 LAB — HM DIABETES EYE EXAM

## 2014-08-12 LAB — GLUCOSE, CAPILLARY: Glucose-Capillary: 236 mg/dL — ABNORMAL HIGH (ref 65–99)

## 2014-08-12 LAB — POCT URINE PREGNANCY: PREG TEST UR: NEGATIVE

## 2014-08-12 MED ORDER — METFORMIN HCL 500 MG PO TABS
500.0000 mg | ORAL_TABLET | Freq: Every day | ORAL | Status: DC
Start: 2014-08-12 — End: 2015-06-04

## 2014-08-12 MED ORDER — FAMOTIDINE 10 MG PO TABS: 10.0000 mg | ORAL_TABLET | Freq: Two times a day (BID) | ORAL | Status: DC | PRN

## 2014-08-12 NOTE — Assessment & Plan Note (Addendum)
Has not been on metformin in the past.  LMP was about 2 months ago.   -started metformin today

## 2014-08-12 NOTE — Assessment & Plan Note (Addendum)
-  needs pap smear, will schedule in the next couple of months

## 2014-08-12 NOTE — Assessment & Plan Note (Signed)
-  pepcid PRN

## 2014-08-12 NOTE — Patient Instructions (Signed)
Thank you for your visit today.   Please return to the internal medicine clinic in when you are able to get your Medicaid Card correct.  Hopefully within the next month or so.       We are not able to make referrals until you have this completed.    Please try to quit smoking.    Please be sure to bring all of your medications with you to every visit; this includes herbal supplements, vitamins, eye drops, and any over-the-counter medications.   Should you have any questions regarding your medications and/or any new or worsening symptoms, please be sure to call the clinic at [phone].   If you believe that you are suffering from a life threatening condition or one that may result in the loss of limb or function, then you should call 911 and proceed to the nearest Emergency Department.   A healthy lifestyle and preventative care can promote health and wellness.   Maintain regular health, dental, and eye exams.  Eat a healthy diet. Foods like vegetables, fruits, whole grains, low-fat dairy products, and lean protein foods contain the nutrients you need without too many calories. Decrease your intake of foods high in solid fats, added sugars, and salt. Get information about a proper diet from your caregiver, if necessary.  Regular physical exercise is one of the most important things you can do for your health. Most adults should get at least 150 minutes of moderate-intensity exercise (any activity that increases your heart rate and causes you to sweat) each week. In addition, most adults need muscle-strengthening exercises on 2 or more days a week.   Maintain a healthy weight. The body mass index (BMI) is a screening tool to identify possible weight problems. It provides an estimate of body fat based on height and weight. Your caregiver can help determine your BMI, and can help you achieve or maintain a healthy weight. For adults 20 years and older:  A BMI below 18.5 is considered  underweight.  A BMI of 18.5 to 24.9 is normal.  A BMI of 25 to 29.9 is considered overweight.  A BMI of 30 and above is considered obese.  Smoking Cessation, Tips for Success If you are ready to quit smoking, congratulations! You have chosen to help yourself be healthier. Cigarettes bring nicotine, tar, carbon monoxide, and other irritants into your body. Your lungs, heart, and blood vessels will be able to work better without these poisons. There are many different ways to quit smoking. Nicotine gum, nicotine patches, a nicotine inhaler, or nicotine nasal spray can help with physical craving. Hypnosis, support groups, and medicines help break the habit of smoking. WHAT THINGS CAN I DO TO MAKE QUITTING EASIER?  Here are some tips to help you quit for good:  Pick a date when you will quit smoking completely. Tell all of your friends and family about your plan to quit on that date.  Do not try to slowly cut down on the number of cigarettes you are smoking. Pick a quit date and quit smoking completely starting on that day.  Throw away all cigarettes.   Clean and remove all ashtrays from your home, work, and car.  On a card, write down your reasons for quitting. Carry the card with you and read it when you get the urge to smoke.  Cleanse your body of nicotine. Drink enough water and fluids to keep your urine clear or pale yellow. Do this after quitting to flush the nicotine  from your body.  Learn to predict your moods. Do not let a bad situation be your excuse to have a cigarette. Some situations in your life might tempt you into wanting a cigarette.  Never have "just one" cigarette. It leads to wanting another and another. Remind yourself of your decision to quit.  Change habits associated with smoking. If you smoked while driving or when feeling stressed, try other activities to replace smoking. Stand up when drinking your coffee. Brush your teeth after eating. Sit in a different chair  when you read the paper. Avoid alcohol while trying to quit, and try to drink fewer caffeinated beverages. Alcohol and caffeine may urge you to smoke.  Avoid foods and drinks that can trigger a desire to smoke, such as sugary or spicy foods and alcohol.  Ask people who smoke not to smoke around you.  Have something planned to do right after eating or having a cup of coffee. For example, plan to take a walk or exercise.  Try a relaxation exercise to calm you down and decrease your stress. Remember, you may be tense and nervous for the first 2 weeks after you quit, but this will pass.  Find new activities to keep your hands busy. Play with a pen, coin, or rubber band. Doodle or draw things on paper.  Brush your teeth right after eating. This will help cut down on the craving for the taste of tobacco after meals. You can also try mouthwash.   Use oral substitutes in place of cigarettes. Try using lemon drops, carrots, cinnamon sticks, or chewing gum. Keep them handy so they are available when you have the urge to smoke.  When you have the urge to smoke, try deep breathing.  Designate your home as a nonsmoking area.  If you are a heavy smoker, ask your health care provider about a prescription for nicotine chewing gum. It can ease your withdrawal from nicotine.  Reward yourself. Set aside the cigarette money you save and buy yourself something nice.  Look for support from others. Join a support group or smoking cessation program. Ask someone at home or at work to help you with your plan to quit smoking.  Always ask yourself, "Do I need this cigarette or is this just a reflex?" Tell yourself, "Today, I choose not to smoke," or "I do not want to smoke." You are reminding yourself of your decision to quit.  Do not replace cigarette smoking with electronic cigarettes (commonly called e-cigarettes). The safety of e-cigarettes is unknown, and some may contain harmful chemicals.  If you relapse,  do not give up! Plan ahead and think about what you will do the next time you get the urge to smoke. HOW WILL I FEEL WHEN I QUIT SMOKING? You may have symptoms of withdrawal because your body is used to nicotine (the addictive substance in cigarettes). You may crave cigarettes, be irritable, feel very hungry, cough often, get headaches, or have difficulty concentrating. The withdrawal symptoms are only temporary. They are strongest when you first quit but will go away within 10-14 days. When withdrawal symptoms occur, stay in control. Think about your reasons for quitting. Remind yourself that these are signs that your body is healing and getting used to being without cigarettes. Remember that withdrawal symptoms are easier to treat than the major diseases that smoking can cause.  Even after the withdrawal is over, expect periodic urges to smoke. However, these cravings are generally short lived and will go away  whether you smoke or not. Do not smoke! WHAT RESOURCES ARE AVAILABLE TO HELP ME QUIT SMOKING? Your health care provider can direct you to community resources or hospitals for support, which may include:  Group support.  Education.  Hypnosis.  Therapy. Document Released: 09/17/2003 Document Revised: 05/05/2013 Document Reviewed: 06/06/2012 [facility] Patient Information 2015 [place], [place]. This information is not intended to replace advice given to you by your health care provider. Make sure you discuss any questions you have with your health care provider.

## 2014-08-12 NOTE — Assessment & Plan Note (Addendum)
HA1c: 9.9 today.  Reports polyuria and polydipsia.  Has not been seen here in about 2 years and last was 5.9. -lipid panel, foot exam, urine microalb -retinal scan today  -restart metformin today  -had Debra call to inform her of results and schedule a f/u appt (left w/o scheduling)  -will refer to [facility] when she gets her Medicaid card changed

## 2014-08-12 NOTE — Assessment & Plan Note (Signed)
-  still smoking 1/2 ppd for 15 years -advised smoking cessation

## 2014-08-12 NOTE — Assessment & Plan Note (Addendum)
Unclear etiology.  States substernal pain comes on occasion whether she is sitting or with exertion.  Not associated with N/V, diaphoresis, but sometimes with dizziness.   -will refer to cardiology once she corrects her Medicaid card to reflect [place] as her PCP -pepcid PRN when has pain

## 2014-08-12 NOTE — Assessment & Plan Note (Signed)
>>  ASSESSMENT AND PLAN FOR DIABETES TYPE 2, UNCONTROLLED WRITTEN ON 08/12/2014  5:25 PM BY GILL, JACQUELYN S, MD  HA1c: 9.9 today.  Reports polyuria and polydipsia.  Has not been seen here in about 2 years and last was 5.9. -lipid panel, foot exam, urine microalb -retinal scan today  -restart metformin today  -had Debra call to inform her of results and schedule a f/u appt (left w/o scheduling)  -will refer to [facility] when she gets her Medicaid card changed

## 2014-08-12 NOTE — Progress Notes (Signed)
Patient ID: Misty [name], female   DOB: [birth year], 34 y.o.   MRN: [id number]     Subjective:   Patient ID: Misty [name] female    DOB: [birth year] 34 y.o.    MRN: [id number] Health Maintenance Due: Health Maintenance Due  Topic Date Due  . OPHTHALMOLOGY EXAM  08/30/1988  . PAP SMEAR  08/30/1996  . TETANUS/TDAP  08/30/1997  . HEMOGLOBIN A1C  12/16/2012  . FOOT EXAM  06/03/2013  . LIPID PANEL  09/16/2013  . URINE MICROALBUMIN  09/16/2013  . INFLUENZA VACCINE  08/03/2014    _________________________________________________  HPI: [name] is a 34 y.o. female here for a routine visit.  She is assigned to me and had not been seen in about 2 years so called to have her come in to be seen.  Pt has a PMH outlined below.  Please see problem-based charting assessment and plan for further details of medical issues addressed at today's visit.  PMH: Past Medical History  Diagnosis Date  . PCOD (polycystic ovarian disease)     Followed by [facility], has been on metformin  . Chest pain     multiple ED evaluations, most likely musculoskeletal (cervical radiculopathy), needs MRI spine  . GERD (gastroesophageal reflux disease)   . Diabetes mellitus     pt states doesn't know if has DM  . Gestational diabetes     Medications: Current Outpatient Prescriptions on File Prior to Visit  Medication Sig Dispense Refill  . acetaminophen (TYLENOL) 500 MG tablet Take 500 mg by mouth daily as needed.     No current facility-administered medications on file prior to visit.    Allergies: No Known Allergies  FH: Family History  Problem Relation Age of Onset  . Diabetes Mother   . Hypertension Mother   . Heart disease Father   . Diabetes Sister   . Hypertension Sister   . Diabetes Brother   . Cancer Maternal Grandmother   . Cancer Paternal Grandmother     SH: Social History   Social History  . Marital Status: Single    Spouse Name: N/A  .  Number of Children: N/A  . Years of Education: N/A   Social History Main Topics  . Smoking status: Current Every Day Smoker -- 0.50 packs/day    Types: Cigarettes  . Smokeless tobacco: None     Comment: thinking about quiting  . Alcohol Use: No  . Drug Use: No  . Sexual Activity: Yes   Other Topics Concern  . None   Social History Narrative    Review of Systems: Constitutional: Negative for fever, chills and weight loss.  Eyes: Negative for blurred vision.  Respiratory: Negative for cough and shortness of breath.  Cardiovascular: +chest pain, palpitations and leg swelling.  Gastrointestinal: Negative for nausea, vomiting, abdominal pain, diarrhea, constipation and blood in stool.  Genitourinary: Negative for dysuria, urgency and +frequency.  Musculoskeletal: Negative for myalgias and back pain.  Neurological: Negative for dizziness, weakness and headaches.     Objective:   Vital Signs: Filed Vitals:   08/12/14 1427  BP: 132/68  Pulse: 80  Temp: 98.5 F (36.9 C)  TempSrc: Oral  Height: 6' 1"  (1.854 m)  Weight: 269 lb 12.8 oz (122.38 kg)  SpO2: 100%      BP Readings from Last 3 Encounters:  08/12/14 132/68  04/22/13 129/56  02/05/13 130/81    Physical Exam: Constitutional: Vital signs reviewed.  Patient is in NAD and cooperative with exam.  Head: Normocephalic and atraumatic. Eyes: EOMI, conjunctivae nl, no scleral icterus.  Neck: Supple. Cardiovascular: RRR, no MRG. Pulmonary/Chest: normal effort, CTAB, no wheezes, rales, or rhonchi. Abdominal: Soft. NT/ND +BS. Neurological: A&O x3, cranial nerves II-XII are grossly intact, moving all extremities. Extremities: 2+DP b/l; trace pitting edema b/l.  Skin: Warm, dry and intact. No rash.   Assessment & Plan:   Assessment and plan was discussed and formulated with my attending.

## 2014-08-13 LAB — LIPID PANEL
Chol/HDL Ratio: 6.4 ratio units — ABNORMAL HIGH (ref 0.0–4.4)
Cholesterol, Total: 186 mg/dL (ref 100–199)
HDL: 29 mg/dL — ABNORMAL LOW (ref >39)
LDL CALC: 117 mg/dL — AB (ref 0–99)
Triglycerides: 202 mg/dL — ABNORMAL HIGH (ref 0–149)
VLDL CHOLESTEROL CAL: 40 mg/dL (ref 5–40)

## 2014-08-13 LAB — COMPREHENSIVE METABOLIC PANEL
ALT: 10 IU/L (ref 0–32)
AST: 10 IU/L (ref 0–40)
Albumin/Globulin Ratio: 1.3 (ref 1.1–2.5)
Albumin: 3.8 g/dL (ref 3.5–5.5)
Alkaline Phosphatase: 61 IU/L (ref 39–117)
BILIRUBIN TOTAL: 0.2 mg/dL (ref 0.0–1.2)
BUN / CREAT RATIO: 12 (ref 8–20)
BUN: 7 mg/dL (ref 6–20)
CO2: 24 mmol/L (ref 18–29)
CREATININE: 0.58 mg/dL (ref 0.57–1.00)
Calcium: 8.8 mg/dL (ref 8.7–10.2)
Chloride: 99 mmol/L (ref 97–108)
GFR, EST AFRICAN AMERICAN: 138 mL/min/{1.73_m2} (ref >59)
GFR, EST NON AFRICAN AMERICAN: 120 mL/min/{1.73_m2} (ref >59)
GLOBULIN, TOTAL: 3 g/dL (ref 1.5–4.5)
Glucose: 256 mg/dL — ABNORMAL HIGH (ref 65–99)
POTASSIUM: 3.8 mmol/L (ref 3.5–5.2)
Sodium: 137 mmol/L (ref 134–144)
Total Protein: 6.8 g/dL (ref 6.0–8.5)

## 2014-08-13 LAB — MICROALBUMIN / CREATININE URINE RATIO
CREATININE, UR: 78.9 mg/dL
MICROALB/CREAT RATIO: 16.6 mg/g{creat} (ref 0.0–30.0)
Microalbumin, Urine: 13.1 ug/mL

## 2014-08-13 NOTE — Progress Notes (Signed)
Internal Medicine Clinic Attending  Case discussed with Dr. Gill soon after the resident saw the patient.  We reviewed the resident's history and exam and pertinent patient test results.  I agree with the assessment, diagnosis, and plan of care documented in the resident's note.  

## 2014-08-24 ENCOUNTER — Encounter: Payer: Self-pay | Admitting: *Deleted

## 2014-09-01 IMAGING — US US OB FOLLOW-UP
1 series · 12 of 28 positions shown · non-contrast
Comparison: none

[Series 1: us ob follow-up · 0.37mm/px · 12 of 43 slices shown]
[im 2/43]
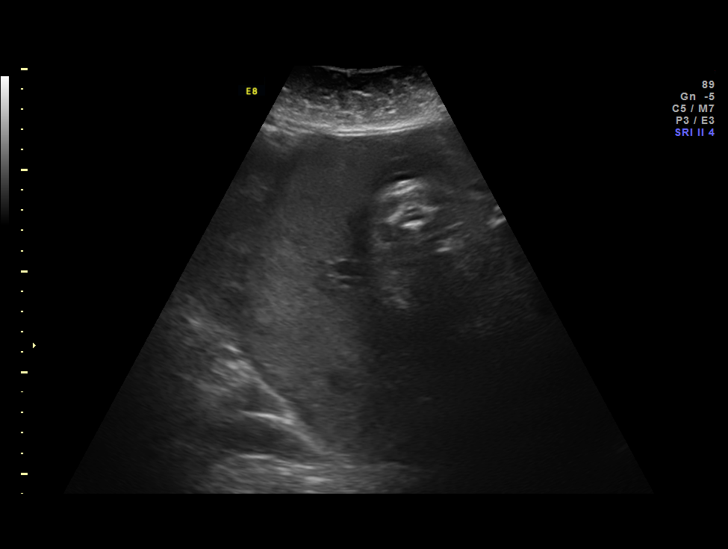
[im 5/43]
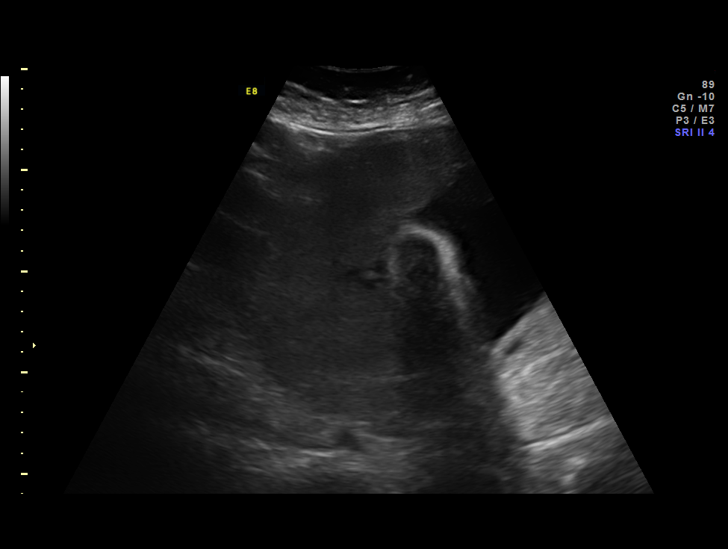
[im 8/43]
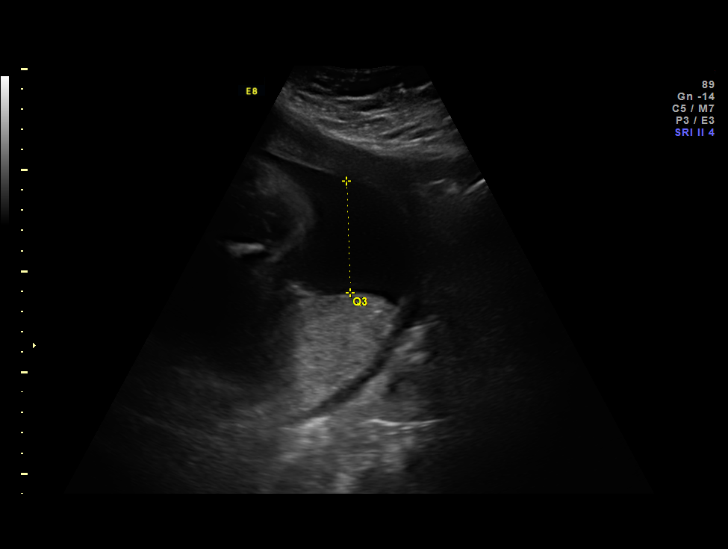
[im 13/43]
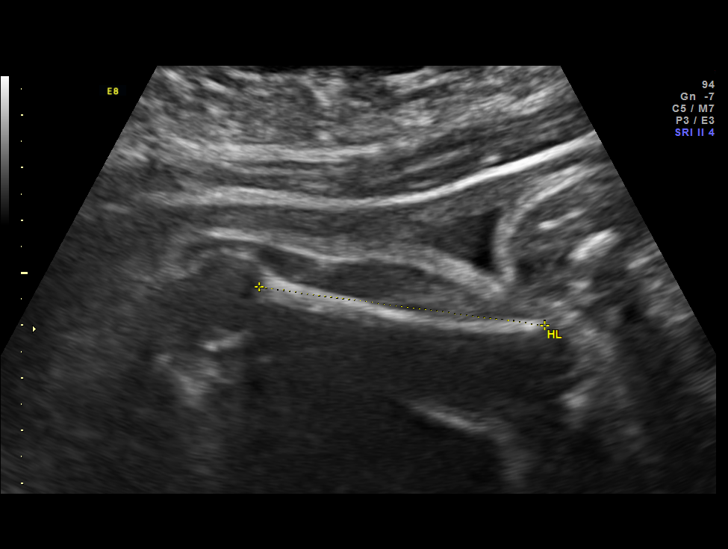
[im 16/43]
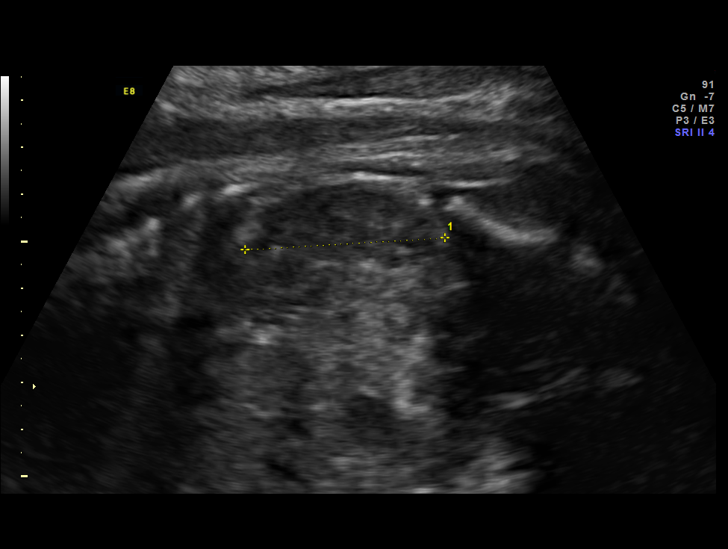
[im 19/43]
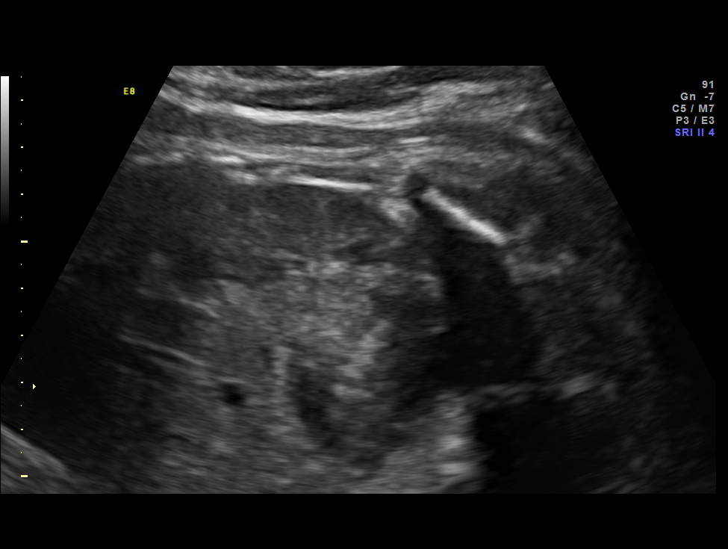
[im 24/43]
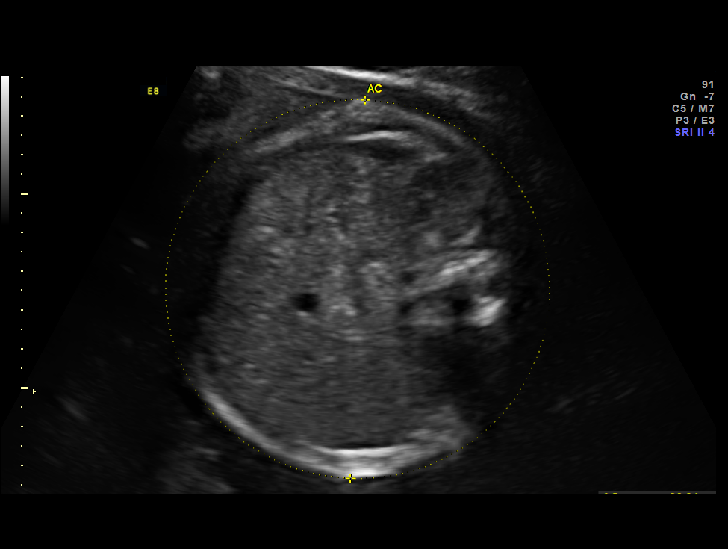
[im 27/43]
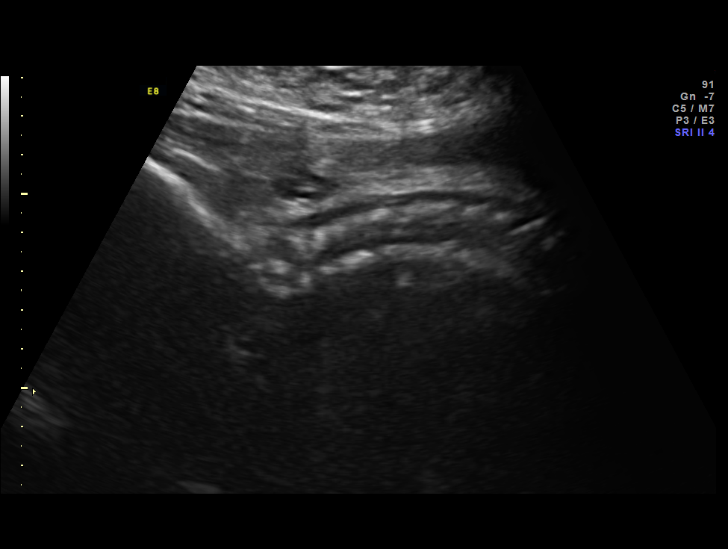
[im 30/43]
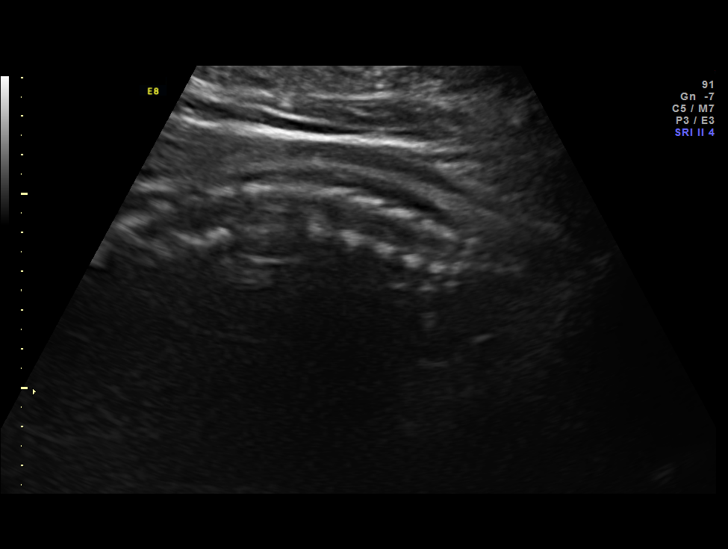
[im 35/43]
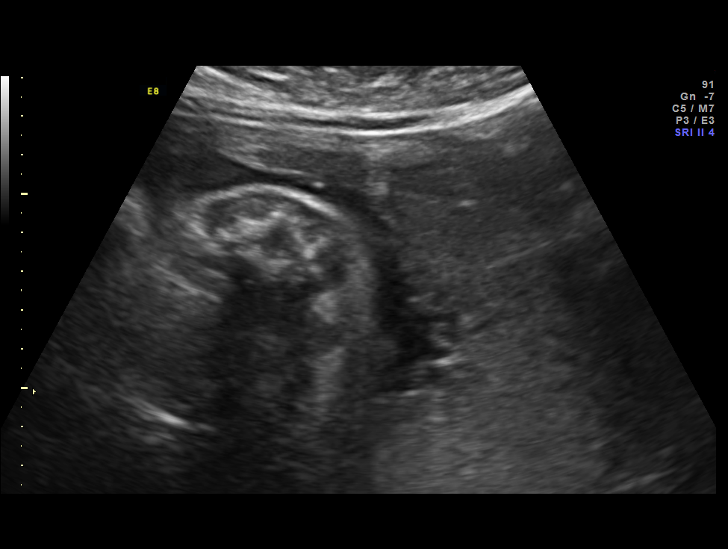
[im 38/43]
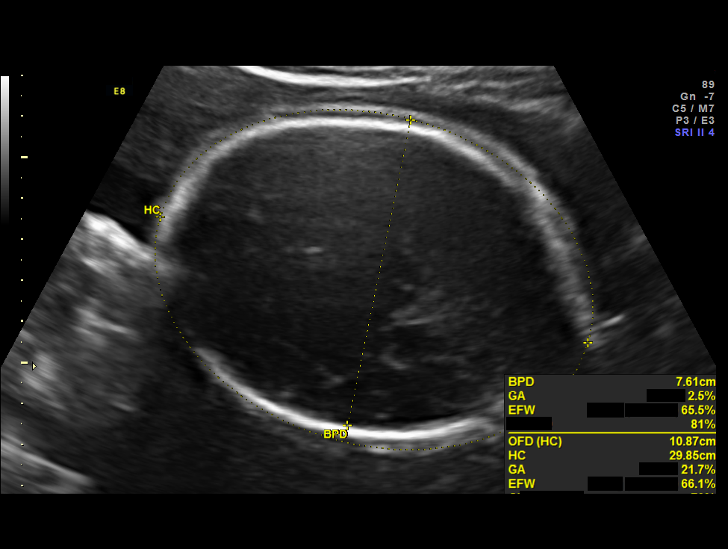
[im 41/43]
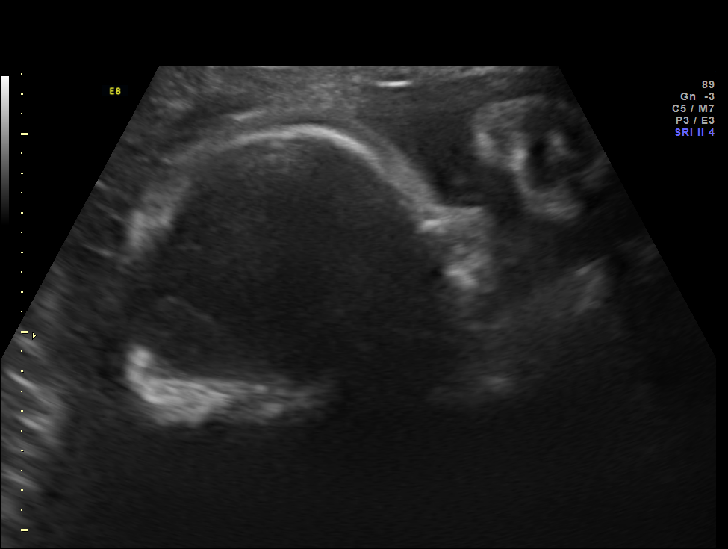

[12 of 28 positions shown; findings below may reference images not displayed]

OBSTETRICS REPORT
                      (Signed Final 11/02/2011 [DATE])

             PIERCEYOUNGER

Service(s) Provided

 US OB FOLLOW UP                                       76816.1
Indications

 Diabetes - Pregestational on glybruide
 Cigarette smoker
 Obesity (281 lb)
 Polycystic ovary syndrome
 Assess Fetal Growth / Estimated Fetal Weight
Fetal Evaluation

 Num Of Fetuses:    1
 Fetal Heart Rate:  161                          bpm
 Cardiac Activity:  Observed
 Presentation:      Breech
 Placenta:          Posterior Fundal, above
                    cervical os
 P. Cord            Previously Visualized
 Insertion:

 Amniotic Fluid
 AFI FV:      Subjectively within normal limits
 AFI Sum:     19.81   cm       74  %Tile     Larg Pckt:    6.78  cm
 RUQ:   2.17    cm   RLQ:    6.78   cm    LUQ:   5.34    cm   LLQ:    5.52   cm
Biophysical Evaluation

 Amniotic F.V:   Within normal limits       F. Tone:        Observed
 F. Movement:    Observed                   Score:          [DATE]
 F. Breathing:   Observed
Biometry

 BPD:     75.9  mm     G. Age:  30w 3d                CI:         70.0   70 - 86
 OFD:    108.4  mm                                    FL/HC:      20.8   19.9 -

 HC:     295.2  mm     G. Age:  32w 4d       14  %    HC/AC:      0.96   0.96 -

 AC:     307.2  mm     G. Age:  34w 5d       93  %    FL/BPD:     81.0   71 - 87
 FL:      61.5  mm     G. Age:  31w 6d       19  %    FL/AC:      20.0   20 - 24
 HUM:       55  mm     G. Age:  32w 0d       42  %
 Est. FW:    7937  gm    4 lb 12 oz      69  %
Gestational Age

 LMP:           36w 6d        Date:  02/17/11                 EDD:   11/24/11
 U/S Today:     32w 3d                                        EDD:   12/25/11
 Best:          32w 5d     Det. By:  Early Ultrasound         EDD:   12/23/11
                                     (05/10/11)
Anatomy

 Cranium:          Previously seen        Ductal Arch:      Previously seen
 Fetal Cavum:      Previously seen        Diaphragm:        Appears normal
 Ventricles:       Previously seen        Stomach:          Appears normal, left
                                                            sided
 Choroid Plexus:   Previously seen        Abdomen:          Appears normal
 Cerebellum:       Previously seen        Abdominal Wall:   Appears nml (cord
                                                            insert, abd wall)
 Posterior Fossa:  Previously seen        Cord Vessels:     Appears normal (3
                                                            vessel cord)
 Nuchal Fold:      Not applicable (>20    Kidneys:          Appear normal
                   wks GA)
 Face:             Previously seen        Bladder:          Appears normal
 Lips:             Previously seen        Spine:            Appears normal
 Heart:            Previously seen        Lower             Previously seen
                                          Extremities:
 RVOT:             Previously seen        Upper             Previously seen
                                          Extremities:
 LVOT:             Previously seen        Limbs:            Previously seen
 Aortic Arch:      Previously seen

 Other:  Female gender. Heels and 5th digit previously seen.
Cervix Uterus Adnexa

 Cervix:       Not visualized (advanced GA >59wks)
Impression

 IUP at 32+5 weeks
 Normal interval anatomy; anatomic survey complete
 Normal amniotic fluid volume
 Appropriate interval growth with EFW at the 69th %tile
 BPP [DATE]
Recommendations

 Begin twice weekly NSTs with weekly AFIs
 Follow-up ultrasound for AFI in one week (with NST)

 PIERCEYOUNGER with us.  Please do not hesitate to

## 2014-09-04 ENCOUNTER — Encounter (HOSPITAL_COMMUNITY): Payer: Self-pay | Admitting: Family Medicine

## 2014-09-04 ENCOUNTER — Encounter: Payer: Self-pay | Admitting: Internal Medicine

## 2014-09-04 ENCOUNTER — Ambulatory Visit (INDEPENDENT_AMBULATORY_CARE_PROVIDER_SITE_OTHER): Payer: Medicaid Other | Admitting: Internal Medicine

## 2014-09-04 ENCOUNTER — Emergency Department (HOSPITAL_COMMUNITY)
Admission: EM | Admit: 2014-09-04 | Discharge: 2014-09-04 | Disposition: A | Discharge status: Home / Self Care | Payer: Medicaid Other | Attending: Emergency Medicine | Admitting: Emergency Medicine

## 2014-09-04 ENCOUNTER — Emergency Department (HOSPITAL_COMMUNITY): Payer: Medicaid Other

## 2014-09-04 VITALS — BP 122/58 | HR 86 | Temp 98.3°F | Ht 73.0 in | Wt 270.2 lb

## 2014-09-04 DIAGNOSIS — R6884 Jaw pain: Secondary | ICD-10-CM

## 2014-09-04 DIAGNOSIS — K0889 Other specified disorders of teeth and supporting structures: Secondary | ICD-10-CM

## 2014-09-04 DIAGNOSIS — Z720 Tobacco use: Secondary | ICD-10-CM | POA: Insufficient documentation

## 2014-09-04 DIAGNOSIS — K088 Other specified disorders of teeth and supporting structures: Secondary | ICD-10-CM | POA: Insufficient documentation

## 2014-09-04 DIAGNOSIS — E119 Type 2 diabetes mellitus without complications: Secondary | ICD-10-CM | POA: Insufficient documentation

## 2014-09-04 DIAGNOSIS — K219 Gastro-esophageal reflux disease without esophagitis: Secondary | ICD-10-CM | POA: Diagnosis not present

## 2014-09-04 DIAGNOSIS — R600 Localized edema: Secondary | ICD-10-CM | POA: Diagnosis present

## 2014-09-04 LAB — I-STAT CHEM 8, ED
BUN: 6 mg/dL (ref 6–20)
Calcium, Ion: 1.15 mmol/L (ref 1.12–1.23)
Chloride: 104 mmol/L (ref 101–111)
Creatinine, Ser: 0.7 mg/dL (ref 0.44–1.00)
Glucose, Bld: 138 mg/dL — ABNORMAL HIGH (ref 65–99)
HCT: 41 % (ref 36.0–46.0)
HEMOGLOBIN: 13.9 g/dL (ref 12.0–15.0)
Potassium: 3.9 mmol/L (ref 3.5–5.1)
Sodium: 140 mmol/L (ref 135–145)
TCO2: 23 mmol/L (ref 0–100)

## 2014-09-04 LAB — CBC WITH DIFFERENTIAL/PLATELET
Basophils Absolute: 0 10*3/uL (ref 0.0–0.1)
Basophils Relative: 0 % (ref 0–1)
Eosinophils Absolute: 0.2 10*3/uL (ref 0.0–0.7)
Eosinophils Relative: 2 % (ref 0–5)
HEMATOCRIT: 38.5 % (ref 36.0–46.0)
Hemoglobin: 12.2 g/dL (ref 12.0–15.0)
LYMPHS ABS: 3.3 10*3/uL (ref 0.7–4.0)
LYMPHS PCT: 32 % (ref 12–46)
MCH: 27.1 pg (ref 26.0–34.0)
MCHC: 31.7 g/dL (ref 30.0–36.0)
MCV: 85.4 fL (ref 78.0–100.0)
MONO ABS: 0.7 10*3/uL (ref 0.1–1.0)
MONOS PCT: 7 % (ref 3–12)
NEUTROS ABS: 6.1 10*3/uL (ref 1.7–7.7)
Neutrophils Relative %: 59 % (ref 43–77)
Platelets: 258 10*3/uL (ref 150–400)
RBC: 4.51 MIL/uL (ref 3.87–5.11)
RDW: 13.3 % (ref 11.5–15.5)
WBC: 10.3 10*3/uL (ref 4.0–10.5)

## 2014-09-04 LAB — I-STAT BETA HCG BLOOD, ED (MC, WL, AP ONLY): I-stat hCG, quantitative: <5 m[IU]/mL (ref <5)

## 2014-09-04 MED ORDER — KETOROLAC TROMETHAMINE 60 MG/2ML IM SOLN: 60.0000 mg | Freq: Once | INTRAMUSCULAR | Status: DC

## 2014-09-04 MED ORDER — OXYCODONE-ACETAMINOPHEN 5-325 MG PO TABS: 1.0000 | ORAL_TABLET | ORAL | Status: DC | PRN

## 2014-09-04 MED ORDER — IOHEXOL 300 MG/ML  SOLN
75.0000 mL | Freq: Once | INTRAMUSCULAR | Status: AC | PRN
  Administered 2014-09-04: 75 mL via INTRAVENOUS

## 2014-09-04 MED ORDER — PENICILLIN V POTASSIUM 500 MG PO TABS
500.0000 mg | ORAL_TABLET | Freq: Two times a day (BID) | ORAL | Status: AC
Start: 2014-09-04 — End: 2014-09-11

## 2014-09-04 MED ORDER — HYDROCODONE-ACETAMINOPHEN 5-325 MG PO TABS
1.0000 | ORAL_TABLET | Freq: Once | ORAL | Status: AC
  Administered 2014-09-04: 1 via ORAL
  Filled 2014-09-04: qty 1

## 2014-09-04 NOTE — Discharge Instructions (Signed)
- Call Dental Clinic from resource guide to schedule an appointment as soon as possible - Percocet for pain control - Take penicillin twice a day for 10 days - Return to ED with fevers, worsening pain, color changes of face or neck, inability to swallow, difficulty breathing, inability to rotate neck or further worsening of symptoms   Emergency Department Resource Guide 1) Find a Doctor and Pay Out of Pocket Although you won't have to find out who is covered by your insurance plan, it is a good idea to ask around and get recommendations. You will then need to call the office and see if the doctor you have chosen will accept you as a new patient and what types of options they offer for patients who are self-pay. Some doctors offer discounts or will set up payment plans for their patients who do not have insurance, but you will need to ask so you aren't surprised when you get to your appointment.  2) Contact Your Local Health Department Not all health departments have doctors that can see patients for sick visits, but many do, so it is worth a call to see if yours does. If you don't know where your local health department is, you can check in your phone book. The CDC also has a tool to help you locate your state's health department, and many state websites also have listings of all of their local health departments.  3) Find a [facility] If your illness is not likely to be very severe or complicated, you may want to try a walk in clinic. These are popping up all over the country in pharmacies, drugstores, and shopping centers. They're usually staffed by nurse practitioners or physician assistants that have been trained to treat common illnesses and complaints. They're usually fairly quick and inexpensive. However, if you have serious medical issues or chronic medical problems, these are probably not your best option.  No Primary Care Doctor: - Call Health Connect at  [phone] - they can help you  locate a primary care doctor that  accepts your insurance, provides certain services, etc. - Physician Referral Service- [phone]  Chronic Pain Problems: Organization         Address  Phone   Notes  [facility]  [phone] Patients need to be referred by their primary care doctor.   Medication Assistance: Organization         Address  Phone   Notes  [facility] Medication [facility] [address] [place], [place] [address] [phone] --Must be a resident of [facility] -- Must have NO insurance coverage whatsoever (no Medicaid/ Medicare, etc.) -- The pt. MUST have a primary care doctor that directs their care regularly and follows them in the community   MedAssist  [phone]   [employer]  [phone]    Agencies that provide inexpensive medical care: Organization         Address  Phone   Notes  [place]  [phone]   [facility]    [phone]   [facility] [place], [place] [address] [phone]   [place] [address] [place] [phone]   Planned Parenthood    [phone]   [facility]    [phone]   [place] and [address], [place] Phone:  [phone], Fax:  [phone] Hours of Operation:  9 am - 6 pm, M-F.  Also accepts Medicaid/Medicare and self-pay.  [facility] for [place], [address], [place] Phone: [phone], Fax: [phone]. Hours of Operation:  8:30 am - 5:30 pm, M-F.  Also accepts Medicaid and self-pay.  [facility] [place] [address] [place] Phone: [phone]   [place], [place], [place] [phone], Ext. [phone] Mondays & Thursdays: 7-9 AM.  First 15 patients are seen on a first come, first serve basis.    [place]  Providers:  Organization         Address  Phone   Notes  [facility] [address], [place] [phone] Also accepts self-pay patients.  [facility] [address], [place]  [phone]   [place], [address], [place] [phone]   [facility] [address], [place] [phone]   Lucianne Lei [address], [place]   [phone] Only accepts [place] Access [place] patients after they have their name applied to their card.   Self-Pay (no insurance) in [facility]:  Organization         Address  Phone   Notes  Sickle Cell Patients, [facility] [place] [phone]   [facility] [place] [phone]   [facility] [place]  [name], [place], [place] [phone]   [facility]/Dr. Osei-Bonsu  [address], [place] or [address], [place] [phone] Phone number for both [place] and [place] locations is the same.  Urgent Medical and [facility] [address], [place] [phone]   [facility] [address] [place] or [address] [phone]   [facility] [address], [place] [phone], phone; [phone], fax Sees patients 1st and 3rd Saturday of every month.  Must not qualify for public or private insurance (i.e. Medicaid, Medicare, [place] Health Choice, Veterans' Benefits)  Household income should be no more than 200% of the poverty level The clinic cannot treat you if you are pregnant or think you are pregnant  Sexually transmitted diseases are not treated at the clinic.    Dental Care: Organization         Address  Phone  Notes  [facility] Department of [facility] [place] [phone] Accepts children up to age 38 who are enrolled in [place] or [place]; pregnant women with a Medicaid card; and children who have applied for Medicaid or [place] Health Choice, but were declined, whose parents can pay a reduced fee at time of service.  [facility] Department of [facility]  [address], [place] [phone] Accepts children up to age 17 who are enrolled in [place] or [place]; pregnant women with a Medicaid card; and children who have applied for Medicaid or [place] Health Choice, but were declined, whose parents can pay a reduced fee at time of service.  [place] Adult Dental Access PROGRAM  [place] [phone] Patients are seen by appointment only. Walk-ins are not accepted. [place]  Dental will see patients 53 years of age and older. Monday - Tuesday (8am-5pm) Most Wednesdays (8:30-5pm) $30 per visit, cash only  [facility] Adult Dental Access PROGRAM  [address], [facility] [phone] Patients are seen by appointment only. Walk-ins are not accepted. [place] will see patients 4 years of age and older. One Wednesday Evening (Monthly: Volunteer Based).  $30 per visit, cash only  [place]  [phone] for adults; Children under age 50, call Graduate Pediatric Dentistry at [phone]. Children aged 68-14, please call [phone] to request a pediatric application.  Dental services are provided in all areas of dental care including fillings, crowns and bridges, complete and partial dentures, implants, gum treatment, root canals, and extractions. Preventive care is also provided. Treatment is provided to both adults and children. Patients are selected via a lottery and there is often a waiting list.   [facility] [address], [place]  [phone] [email or web address]   [facility]  [address] [place], [place] [phone], Ext. [phone] Second and Fourth Thursday of each month, opens at 6:30 AM; Clinic ends at 9 AM.  Patients are seen on a first-come first-served basis, and a limited number are seen during each clinic.   [facility]  [address] Hillard Danker [place], [place] [phone]   Eligibility Requirements You must have lived in [place], [place], or [place] counties for at least the last three months.   You cannot be eligible for state or federal sponsored [employer], including [employer], [place], or [employer].   You generally cannot be eligible for healthcare insurance through your employer.    How to apply: Eligibility screenings are held every Tuesday and Wednesday afternoon from 1:00 pm until 4:00 pm. You do not need an appointment for the interview!  [facility] [address] [place], [place]   [place]  [place] Department  [place]  [phone]    Behavioral Health Resources in the Community: Intensive Outpatient Programs Organization         Address  Phone  Notes  [place]. [address] [place], [place] [phone]   [name] Outpatient [address], [place], [place]   ADS: Alcohol & Drug Svcs [address] [place], [place]   [place] [address]  [place], [place] or [phone]   Substance Abuse Resources Organization         Address  Phone  Notes  Alcohol and Drug Services  [phone]   [place]  [phone]   [facility] [place]   Chinita Pester  [phone]   Residential & Outpatient Substance Abuse Program  [phone]   Psychological Services Organization         Address  Phone  Notes  [facility] [place]  [place]  [phone]   [place] [address] [place] or [phone]    Mobile Crisis Teams Organization         Address  Phone  Notes  [facility], Mobile Crisis Care Unit  [phone]   [facility]  [address] [place], [place]   [facility] [address] [place] [phone]    Self-Help/Support Groups Organization  Address  Phone             Notes  [place]. of [place] - variety of support groups  [place] Call for more information  Narcotics Anonymous (NA), [facility] [address], [employer] [place]  2 meetings at this location   Special educational needs teacher         Address  Phone  Notes  [facility]AP Residential Treatment [place],    [place]  [phone]   [facility]  [address], [place] [address]9924, [place], [place]   [place], [place] [phone] Admissions: 8am-3pm M-F  Incentives Substance [place] [address],    [place], [place] [phone]   [facility] [address] [place], [place], [place]   [facility] [address],  [place], [place]   [facility] Programs - Intensive Outpatient [address], [place], [place]   [facility] [address] [address]  [place], [place] [phone] or [phone]   [facility] Services (RTS) [address] [place], [place] Accepts Medicaid  [facility] [place] [address]  [place] [phone] Substance Abuse/Addiction Treatment   [facility] Organization         Address  Phone  Notes  [facility]  [phone]   Domenic Schwab, PhD [address] Arlis [place], [place]   [phone] or [phone]    [place], [place] [phone]   [facility] [address], [place], [place] [phone] Insurance/Medicaid/sponsorship through [facility] and Families [address], [address]                                    [place], [place] [phone] [place] [address], [place] [phone]    Dr. Adele Schilder  [phone]   [facility] Dept. 1) [address], [place] 2) [place] 3)  [place] [address], [place] [phone]  [phone]   [place] [phone] or [phone] (After Hours)

## 2014-09-04 NOTE — ED Notes (Signed)
Pt refused to get updates vitals for discharge, stated "there was no point in them they are the same as they were an hour ago".

## 2014-09-04 NOTE — ED Provider Notes (Signed)
CSN: [id number]     Arrival date & time 09/04/14  1522 History   First MD Initiated Contact with Patient 09/04/14 1557     Chief Complaint  Patient presents with  . Facial Swelling   HPI  Misty [name] is a 34 year old female with PMHx of DM presenting from internal medicine with facial pain. Pt presented to her PCP with left upper jaw pain that began 3 days ago. Pain radiates into lower jaw, lateral neck and left shoulder. Pt report swelling of the left side of the face. She used an ice pack for the swelling and reports improvement. Pt states pain is so severe she has trouble chewing and swallowing. Pt able to swallow food and liquids but with significant pain. She took a tylenol this morning with minimal pain relief. Denies fevers, chills, difficulty breathing, chest pain, nausea or vomiting.   Pt seen in internal medicine clinic this afternoon. They were not able to fit her in for facial CT before close of clinic. Sent pt to ED for CT scan.  Past Medical History  Diagnosis Date  . PCOD (polycystic ovarian disease)     Followed by [facility], has been on metformin  . Chest pain     multiple ED evaluations, most likely musculoskeletal (cervical radiculopathy), needs MRI spine  . GERD (gastroesophageal reflux disease)   . Diabetes mellitus     pt states doesn't know if has DM  . Gestational diabetes    Past Surgical History  Procedure Laterality Date  . Cesarean section  12/14/2011    Procedure: CESAREAN SECTION;  Surgeon: Misty Crocker, MD;  Location: [facility];  Service: Obstetrics;  Laterality: N/A;  Primary Cesarean Section Delivery Girl @ [phone], Apgars   Family History  Problem Relation Age of Onset  . Diabetes Mother   . Hypertension Mother   . Heart disease Father   . Diabetes Sister   . Hypertension Sister   . Diabetes Brother   . Cancer Maternal Grandmother   . Cancer Paternal Grandmother    Social History  Substance Use Topics  . Smoking status: Current Every Day  Smoker -- 0.50 packs/day    Types: Cigarettes  . Smokeless tobacco: None     Comment: thinking about quiting  . Alcohol Use: No   OB History    Gravida Para Term Preterm AB TAB SAB Ectopic Multiple Living   1 1 1  0 0 0 0 0 0 1     Review of Systems  Constitutional: Negative for fever and chills.  HENT: Positive for dental problem, facial swelling and trouble swallowing.   Respiratory: Negative for shortness of breath and stridor.   Cardiovascular: Negative for chest pain.  Gastrointestinal: Negative for nausea, vomiting and abdominal pain.  Musculoskeletal: Positive for neck pain. Negative for neck stiffness.  Neurological: Negative for headaches.      Allergies  Review of patient's allergies indicates no known allergies.  Home Medications   Prior to Admission medications   Medication Sig Start Date End Date Taking? Authorizing Provider  acetaminophen (TYLENOL) 500 MG tablet Take 500 mg by mouth daily as needed.    Historical Provider, MD  famotidine (PEPCID AC) 10 MG tablet Take 1 tablet (10 mg total) by mouth 2 (two) times daily as needed for heartburn or indigestion. 08/12/14   Jones Bales, MD  metFORMIN (GLUCOPHAGE) 500 MG tablet Take 1 tablet (500 mg total) by mouth daily with breakfast. 08/12/14 08/12/15  Jones Bales, MD  oxyCODONE-acetaminophen (PERCOCET/ROXICET) 5-325 MG per tablet Take 1 tablet by mouth every 4 (four) hours as needed for severe pain. 09/04/14   Jermari Tamargo, PA-C  penicillin v potassium (VEETID) 500 MG tablet Take 1 tablet (500 mg total) by mouth 2 (two) times daily. 09/04/14 09/11/14  Zalyn Amend, PA-C   BP 146/81 mmHg  Pulse 72  Temp(Src) 98.4 F (36.9 C) (Oral)  Resp 16  SpO2 99%  LMP 06/01/2014 Physical Exam  Constitutional: She is oriented to person, place, and time. She appears well-developed and well-nourished. No distress.  HENT:  Head: Normocephalic and atraumatic.  Mouth/Throat: Uvula is midline and oropharynx is clear and moist.  Abnormal dentition. No dental abscesses or uvula swelling.  No visible swelling of face. No color changes over cheek or face. TTP from left maxilla, over cheek, lateral neck and into top of shoulder. Pt with poor dentition throughout. No obvious abscess.    Neck: Normal range of motion.  Left lateral neck tender. Pt with full ROM of neck. Can touch chin to chest with no pain.   Cardiovascular: Normal rate, regular rhythm and normal heart sounds.   Pulmonary/Chest: Effort normal and breath sounds normal. No stridor. No respiratory distress. She has no wheezes.  Musculoskeletal: Normal range of motion.  Lymphadenopathy:    She has no cervical adenopathy.  Neurological: She is alert and oriented to person, place, and time.  Skin: Skin is warm and dry. No erythema.  Psychiatric: She has a normal mood and affect. Her behavior is normal.  Nursing note and vitals reviewed.   ED Course  Procedures (including critical care time) Labs Review Labs Reviewed  I-STAT CHEM 8, ED - Abnormal; Notable for the following:    Glucose, Bld 138 (*)    All other components within normal limits  CBC WITH DIFFERENTIAL/PLATELET  I-STAT BETA HCG BLOOD, ED ([facility], [facility], [facility] ONLY)    Imaging Review Ct Maxillofacial W/cm  09/04/2014   CLINICAL DATA:  Left-sided jaw pain started 3 days ago.  EXAM: CT MAXILLOFACIAL WITH CONTRAST  TECHNIQUE: Multidetector CT imaging of the maxillofacial structures was performed with intravenous contrast. Multiplanar CT image reconstructions were also generated. A small metallic BB was placed on the right temple in order to reliably differentiate right from left.  CONTRAST:  66m OMNIPAQUE IOHEXOL 300 MG/ML  SOLN  COMPARISON:  None.  FINDINGS: The globes are intact. The orbital walls are intact. The orbital floors are intact. The maxilla is intact. The mandible is intact. The zygomatic arches are intact. The nasal septum is midline. There is no nasal bone fracture. The temporomandibular joints  are normal.  Mild bilateral maxillary sinus mucosal thickening. The remainder the paranasal sinuses are clear. The visualized portions of the mastoid sinuses are well aerated.  There is periapical lucency involving the left upper second and third premolar. There is severe periapical lucency involving the left lower second and third premolars. There is periapical lucency involving the right upper canine first premolar. There is a dental carie involving the left upper first premolar.  IMPRESSION: 1. Dental disease as described above most severe on the left side. There is no periodontal abscess. Dental consultation is recommended.   Electronically Signed   By: HKathreen Devoid  On: 09/04/2014 18:19   I have personally reviewed and evaluated these images and lab results as part of my medical decision-making.   EKG Interpretation None      MDM   Final diagnoses:  Pain, dental   Pt  presenting from internal medicine for CT of face. Pt presented earlier with left sided facial pain, swelling and painful swallowing. Denies fevers, chills, inability to swallow and difficulty breathing. VSS. Pt nontoxic. Poor dentition throughout mouth with no obvious signs of abscess. Left lateral neck TTP, full ROM intact. Facial CT shows no abscess. Pt discharged with penicillin VK. Instructed to make an appointment as soon as possible with dental clinic given in resource guide. Pt to make follow up appointment with internal medicine clinic. Return precautions given in discharge paperwork. Pt agrees with this plan.     Misty Crocker Anibal Quinby, PA-C 09/04/14 Wanda, MD 09/05/14 [phone]

## 2014-09-04 NOTE — Progress Notes (Signed)
Patient ID: Misty [name], female   DOB: Nov 24, 1980, 34 y.o.   MRN: [id number]   Subjective:   Patient ID: Misty [name] female   DOB: [birth year] 34 y.o.   MRN: [id number]  HPI: Misty [name] is a 34 y.o. with PMH listed below. Presented today with complainst of pain ion her left jaw, that started 3 days ago on her upper jaw, ,rates the pain as a 10/10. Swelling started the next day, but has gotten better. She tried cold compress which reduced the swelling, also tylenol- she has been taking tylenol everyday, 2-3 times a day, OTC 258m she takes 4 at a time. Pain limits movement of her jaw, makes it hard to chew. No difficulty breathing, but has difficulty swallowing such that she cannot swallow her saliva. Pain is limited to the left side only, and gradually spread from her upper jaw to lower jaw and then to her neck and shoulder.  She has always had tooth pain on both sides, she feels she has had abscess on her right side, and this drained from inside on its own.  No fever or chills, no malaise.  She has uncontrolled diabetes, was started on metformin- last month she has not picked up prescription.   Past Medical History  Diagnosis Date  . PCOD (polycystic ovarian disease)     [facility] has been on metformin  . Chest pain     multiple ED evaluations, most likely musculoskeletal (cervical radiculopathy), needs MRI spine  . GERD (gastroesophageal reflux disease)   . Diabetes mellitus     pt states doesn't know if has DM  . Gestational diabetes    Current Outpatient Prescriptions  Medication Sig Dispense Refill  . acetaminophen (TYLENOL) 500 MG tablet Take 500 mg by mouth daily as needed.    . famotidine (PEPCID AC) 10 MG tablet Take 1 tablet (10 mg total) by mouth 2 (two) times daily as needed for heartburn or indigestion. 60 tablet 3  . metFORMIN (GLUCOPHAGE) 500 MG tablet Take 1 tablet (500 mg total) by mouth daily with breakfast. 30 tablet 1     No current facility-administered medications for this visit.   Family History  Problem Relation Age of Onset  . Diabetes Mother   . Hypertension Mother   . Heart disease Father   . Diabetes Sister   . Hypertension Sister   . Diabetes Brother   . Cancer Maternal Grandmother   . Cancer Paternal Grandmother    Social History   Social History  . Marital Status: Single    Spouse Name: N/A  . Number of Children: N/A  . Years of Education: N/A   Social History Main Topics  . Smoking status: Current Every Day Smoker -- 0.50 packs/day    Types: Cigarettes  . Smokeless tobacco: None     Comment: thinking about quiting  . Alcohol Use: No  . Drug Use: No  . Sexual Activity: Yes   Other Topics Concern  . None   Social History Narrative   Review of Systems: CONSTITUTIONAL- No Fever,poor appetite because if pain SKIN- No Rash, colour changes or itching. HEAD- No Headache or dizziness. RESPIRATORY- No Cough or SOB. CARDIAC- No Palpitations, DOE, PND or chest pain. GI- Novomiting, diarrhoea, abd pain. URINARY- No Frequency, dysuria.  Objective:  Physical Exam: Filed Vitals:   09/04/14 1433  BP: 122/58  Pulse: 86  Temp: 98.3 F (36.8 C)  TempSrc: Oral  Height: 6' 1"  (1.854 m)  Weight:  270 lb 3.2 oz (122.562 kg)  SpO2: 100%   GENERAL- alert, co-operative, appears as stated age, not in any distress. HEENT- Atraumatic, normocephalic, no obvious swelling of her jaw bilaterally, but pt is obese, tenderness left side of her face extending maxillary area to supraclavicular area, no obvious swelling in pts mouth but has poor dentition, no obvious redness to face. CARDIAC- RRR, no murmurs, rubs or gallops. RESP- Moving equal volumes of air, and clear to auscultation bilaterally, no wheezes or crackles. ABDOMEN- Soft, nontender, bowel sounds present. NEURO- No obvious Cr N abnormality, strenght upper and lower extremities- 5/5, Sensation intact- globally, DTRs- Normal, finger to  nose test normal bilat, rapid alternating movement- intact, Gait- Normal. EXTREMITIES- pulse 2+, symmetric, no pedal edema. SKIN- Warm, dry, No rash or lesion. PSYCH- Normal mood and affect, appropriate thought content and speech.  Assessment & Plan:   The patient's case and plan of care was discussed with attending physician, Dr. Daryll Drown.  Pt requires a CT scan W contrast now to evaluate for deep space infection, pt diabetes- uncontrolled increase risk for infection, but considering the time, we will be unable to get CT scan done before clinic closes and then admit patient to hospital if needed, before the holiday weekend. If there is an abscess she will need admission and IV antibiotics, and if no abscess she will need antibiotics, and dental and IMC follow up. Will send pt up to the ED, Talked to Charge nurse about pt.

## 2014-09-04 NOTE — ED Notes (Signed)
Pt sent here for CT scan of face for facial swelling.

## 2014-09-08 NOTE — Progress Notes (Signed)
Internal Medicine Clinic Attending  Case discussed with Dr. Emokpae at the time of the visit.  We reviewed the resident's history and exam and pertinent patient test results.  I agree with the assessment, diagnosis, and plan of care documented in the resident's note.  

## 2015-04-13 ENCOUNTER — Encounter: Payer: Self-pay | Admitting: Internal Medicine

## 2015-06-03 ENCOUNTER — Encounter (HOSPITAL_COMMUNITY): Payer: Self-pay | Admitting: Emergency Medicine

## 2015-06-03 ENCOUNTER — Emergency Department (HOSPITAL_COMMUNITY): Payer: Medicaid Other

## 2015-06-03 ENCOUNTER — Other Ambulatory Visit: Payer: Self-pay

## 2015-06-03 DIAGNOSIS — X501XXA Overexertion from prolonged static or awkward postures, initial encounter: Secondary | ICD-10-CM | POA: Insufficient documentation

## 2015-06-03 DIAGNOSIS — Z8639 Personal history of other endocrine, nutritional and metabolic disease: Secondary | ICD-10-CM | POA: Insufficient documentation

## 2015-06-03 DIAGNOSIS — Z9119 Patient's noncompliance with other medical treatment and regimen: Secondary | ICD-10-CM | POA: Insufficient documentation

## 2015-06-03 DIAGNOSIS — F17210 Nicotine dependence, cigarettes, uncomplicated: Secondary | ICD-10-CM | POA: Insufficient documentation

## 2015-06-03 DIAGNOSIS — Y9289 Other specified places as the place of occurrence of the external cause: Secondary | ICD-10-CM | POA: Insufficient documentation

## 2015-06-03 DIAGNOSIS — S29011A Strain of muscle and tendon of front wall of thorax, initial encounter: Secondary | ICD-10-CM | POA: Insufficient documentation

## 2015-06-03 DIAGNOSIS — K219 Gastro-esophageal reflux disease without esophagitis: Secondary | ICD-10-CM | POA: Insufficient documentation

## 2015-06-03 DIAGNOSIS — Y998 Other external cause status: Secondary | ICD-10-CM | POA: Insufficient documentation

## 2015-06-03 DIAGNOSIS — Y9389 Activity, other specified: Secondary | ICD-10-CM | POA: Insufficient documentation

## 2015-06-03 DIAGNOSIS — E1165 Type 2 diabetes mellitus with hyperglycemia: Secondary | ICD-10-CM | POA: Insufficient documentation

## 2015-06-03 LAB — CBC
HEMATOCRIT: 39.6 % (ref 36.0–46.0)
Hemoglobin: 12.5 g/dL (ref 12.0–15.0)
MCH: 26.9 pg (ref 26.0–34.0)
MCHC: 31.6 g/dL (ref 30.0–36.0)
MCV: 85.2 fL (ref 78.0–100.0)
Platelets: 236 10*3/uL (ref 150–400)
RBC: 4.65 MIL/uL (ref 3.87–5.11)
RDW: 13.4 % (ref 11.5–15.5)
WBC: 9.6 10*3/uL (ref 4.0–10.5)

## 2015-06-03 LAB — BASIC METABOLIC PANEL
Anion gap: 7 (ref 5–15)
CHLORIDE: 104 mmol/L (ref 101–111)
CO2: 24 mmol/L (ref 22–32)
CREATININE: 0.6 mg/dL (ref 0.44–1.00)
Calcium: 8.8 mg/dL — ABNORMAL LOW (ref 8.9–10.3)
GFR calc Af Amer: >60 mL/min (ref >60)
GFR calc non Af Amer: >60 mL/min (ref >60)
GLUCOSE: 232 mg/dL — AB (ref 65–99)
POTASSIUM: 3.6 mmol/L (ref 3.5–5.1)
Sodium: 135 mmol/L (ref 135–145)

## 2015-06-03 LAB — I-STAT TROPONIN, ED: Troponin i, poc: 0.01 ng/mL (ref 0.00–0.08)

## 2015-06-03 NOTE — ED Notes (Signed)
Pt states she was riding in a car about 1 hr ago and started having left sided chest pain. Pt state it fels like a pulled muscle. Ems gave pt 323m ASA and 2 nitro tablets. Pt states pain is unchanged. Pt is warn and dry.

## 2015-06-04 ENCOUNTER — Emergency Department (HOSPITAL_COMMUNITY)
Admission: EM | Admit: 2015-06-04 | Discharge: 2015-06-04 | Disposition: A | Discharge status: Home / Self Care | Payer: Medicaid Other | Attending: Emergency Medicine | Admitting: Emergency Medicine

## 2015-06-04 DIAGNOSIS — R079 Chest pain, unspecified: Secondary | ICD-10-CM

## 2015-06-04 DIAGNOSIS — R739 Hyperglycemia, unspecified: Secondary | ICD-10-CM

## 2015-06-04 DIAGNOSIS — E1165 Type 2 diabetes mellitus with hyperglycemia: Secondary | ICD-10-CM

## 2015-06-04 DIAGNOSIS — Z9114 Patient's other noncompliance with medication regimen: Secondary | ICD-10-CM

## 2015-06-04 DIAGNOSIS — IMO0002 Reserved for concepts with insufficient information to code with codable children: Secondary | ICD-10-CM

## 2015-06-04 DIAGNOSIS — T148XXA Other injury of unspecified body region, initial encounter: Secondary | ICD-10-CM

## 2015-06-04 LAB — I-STAT TROPONIN, ED: TROPONIN I, POC: 0.03 ng/mL (ref 0.00–0.08)

## 2015-06-04 MED ORDER — OXYCODONE-ACETAMINOPHEN 5-325 MG PO TABS: 1.0000 | ORAL_TABLET | Freq: Four times a day (QID) | ORAL | Status: DC | PRN

## 2015-06-04 MED ORDER — OXYCODONE-ACETAMINOPHEN 5-325 MG PO TABS
1.0000 | ORAL_TABLET | Freq: Once | ORAL | Status: AC
  Administered 2015-06-04: 1 via ORAL
  Filled 2015-06-04: qty 1

## 2015-06-04 MED ORDER — METHOCARBAMOL 750 MG PO TABS: 750.0000 mg | ORAL_TABLET | Freq: Three times a day (TID) | ORAL | Status: DC

## 2015-06-04 MED ORDER — METHOCARBAMOL 750 MG PO TABS
750.0000 mg | ORAL_TABLET | Freq: Once | ORAL | Status: AC
  Administered 2015-06-04: 750 mg via ORAL
  Filled 2015-06-04: qty 1

## 2015-06-04 MED ORDER — METFORMIN HCL 500 MG PO TABS: 500.0000 mg | ORAL_TABLET | Freq: Every day | ORAL | Status: DC

## 2015-06-04 NOTE — ED Provider Notes (Signed)
CSN: [id number]     Arrival date & time 06/03/15  2113 History   First MD Initiated Contact with Patient 06/04/15 0135     Chief Complaint  Patient presents with  . Chest Pain     (Consider location/radiation/quality/duration/timing/severity/associated sxs/prior Treatment) HPI Comments: Patient states that today she was picking up her 37-year-old child in it."  Position.  She felt a little strain in her left chest that resolved spontaneously about an hour later while twisting in the car.  She had a sudden onset of left-sided chest pain that starts in the midsternal area, radiates under her left breast to the left side of her rib which is worse with movement, certain movements or deep breath. He took an Advil without any relief.  EMS was called.  They gave her 325 mg aspirin and 2 nitroglycerin tablets, which she did not help her discomfort either.  She does not report any nausea, diaphoresis, shortness of breath associated with symptoms. She is a non-insulin-dependent diabetic who has not taken any of her metformin in over a year.  Patient is a 35 y.o. female presenting with chest pain. The history is provided by the patient.  Chest Pain Pain location:  L chest Pain quality: aching   Pain radiates to:  Epigastrium Pain radiates to the back: no   Pain severity:  Moderate Onset quality:  Sudden Duration:  2 hours Timing:  Constant Progression:  Unchanged Chronicity:  Recurrent Context: breathing, lifting, movement and raising an arm   Relieved by:  Nothing Worsened by:  Certain positions, deep breathing and movement Ineffective treatments:  Aspirin and nitroglycerin Associated symptoms: no cough, no dizziness, no fever, no headache, no nausea and no shortness of breath     Past Medical History  Diagnosis Date  . PCOD (polycystic ovarian disease)     Followed by [facility], has been on metformin  . Chest pain     multiple ED evaluations, most likely musculoskeletal (cervical radiculopathy),  needs MRI spine  . GERD (gastroesophageal reflux disease)   . Diabetes mellitus     pt states doesn't know if has DM  . Gestational diabetes    Past Surgical History  Procedure Laterality Date  . Cesarean section  12/14/2011    Procedure: CESAREAN SECTION;  Surgeon: Lahoma Crocker, MD;  Location: [facility];  Service: Obstetrics;  Laterality: N/A;  Primary Cesarean Section Delivery Girl @ [phone], Apgars   Family History  Problem Relation Age of Onset  . Diabetes Mother   . Hypertension Mother   . Heart disease Father   . Diabetes Sister   . Hypertension Sister   . Diabetes Brother   . Cancer Maternal Grandmother   . Cancer Paternal Grandmother    Social History  Substance Use Topics  . Smoking status: Current Every Day Smoker -- 0.50 packs/day    Types: Cigarettes  . Smokeless tobacco: None     Comment: thinking about quiting  . Alcohol Use: No   OB History    Gravida Para Term Preterm AB TAB SAB Ectopic Multiple Living   1 1 1  0 0 0 0 0 0 1     Review of Systems  Constitutional: Negative for fever and chills.  Respiratory: Negative for cough and shortness of breath.   Cardiovascular: Positive for chest pain.  Gastrointestinal: Negative for nausea.  Neurological: Negative for dizziness and headaches.  All other systems reviewed and are negative.     Allergies  Review of patient's allergies indicates no  known allergies.  Home Medications   Prior to Admission medications   Medication Sig Start Date End Date Taking? Authorizing Provider  acetaminophen (TYLENOL) 500 MG tablet Take 500 mg by mouth daily as needed.    Historical Provider, MD  famotidine (PEPCID AC) 10 MG tablet Take 1 tablet (10 mg total) by mouth 2 (two) times daily as needed for heartburn or indigestion. 08/12/14   Jones Bales, MD  metFORMIN (GLUCOPHAGE) 500 MG tablet Take 1 tablet (500 mg total) by mouth daily with breakfast. 06/04/15 06/03/16  Junius Creamer, NP  methocarbamol (ROBAXIN) 750 MG tablet  Take 1 tablet (750 mg total) by mouth 3 (three) times daily. 06/04/15   Junius Creamer, NP  oxyCODONE-acetaminophen (PERCOCET/ROXICET) 5-325 MG tablet Take 1 tablet by mouth every 6 (six) hours as needed for severe pain. 06/04/15   Junius Creamer, NP   BP 134/67 mmHg  Pulse 85  Temp(Src) 98.6 F (37 C) (Oral)  Resp 12  Ht 6' (1.829 m)  Wt 117.028 kg  BMI 34.98 kg/m2  SpO2 100%  LMP 04/20/2015 Physical Exam  Constitutional: She appears well-developed and well-nourished.  HENT:  Head: Normocephalic.  Eyes: Pupils are equal, round, and reactive to light.  Neck: Normal range of motion.  Cardiovascular: Normal rate and regular rhythm.   Pulmonary/Chest: Effort normal and breath sounds normal. She exhibits no tenderness.  I cannot reproduce the pain with palpation, but with moving the arm or having the patient take a deep breath she experiences discomfort  Abdominal: Soft.  Musculoskeletal: Normal range of motion.  Neurological: She is alert.  Skin: Skin is warm.  Nursing note and vitals reviewed.   ED Course  Procedures (including critical care time) Labs Review Labs Reviewed  BASIC METABOLIC PANEL - Abnormal; Notable for the following:    Glucose, Bld 232 (*)    BUN <5 (*)    Calcium 8.8 (*)    All other components within normal limits  CBC  I-STAT TROPOININ, ED  I-STAT TROPOININ, ED    Imaging Review Dg Chest 2 View  06/03/2015  CLINICAL DATA:  Acute onset of left-sided chest pain, radiating to the back and left arm. Tingling. Initial encounter. EXAM: CHEST  2 VIEW COMPARISON:  Chest radiograph performed 12/04/2012 FINDINGS: The lungs are well-aerated. Pulmonary vascularity is at the upper limits of normal. There is no evidence of focal opacification, pleural effusion or pneumothorax. The heart is normal in size; the mediastinal contour is within normal limits. No acute osseous abnormalities are seen. IMPRESSION: No acute cardiopulmonary process seen. Electronically Signed   By:  Garald Balding M.D.   On: 06/03/2015 22:12   I have personally reviewed and evaluated these images and lab results as part of my medical decision-making.   EKG Interpretation None    Was given Robaxin and ibuprofen and feels significantly better.  His second set of cardiac markers did not show a significant increase EKG and chest x-ray within normal parameters.  I will also give her a prescription for metformin that she can fill and hopefully take on a regular basis  MDM   Final diagnoses:  Nonspecific chest pain  Musculoskeletal strain  H/O medication noncompliance  Hyperglycemia         Junius Creamer, NP 06/04/15 0403  Everlene Balls, MD 06/04/15 2992

## 2015-06-04 NOTE — ED Notes (Signed)
Gail NP at bedside 

## 2015-06-04 NOTE — ED Notes (Signed)
Message sent to pharmacy requesting robaxin.

## 2015-06-04 NOTE — Discharge Instructions (Signed)
Today your evaluated for chest pain.  It does not appear to be from your heart.  You were given a muscle relaxer and anti-inflammatory.  Please take this as directed.  His needed.  You've also been given a refill for your metformin.  Please take this as directed by your primary care physician, you can fill this at [facility].  It is on the $4 pharmacy list U been given a referral to community wellness you can make an appointment.  Therefore, continued medical care or with your previous provider

## 2016-05-15 ENCOUNTER — Telehealth: Payer: Self-pay

## 2016-05-15 NOTE — Telephone Encounter (Signed)
Phone number is not working.

## 2016-05-16 ENCOUNTER — Encounter (INDEPENDENT_AMBULATORY_CARE_PROVIDER_SITE_OTHER): Payer: Self-pay

## 2016-05-16 ENCOUNTER — Encounter: Payer: Self-pay | Admitting: Internal Medicine

## 2016-05-16 ENCOUNTER — Ambulatory Visit (INDEPENDENT_AMBULATORY_CARE_PROVIDER_SITE_OTHER): Payer: Self-pay | Admitting: Internal Medicine

## 2016-05-16 VITALS — BP 105/61 | HR 66 | Temp 98.1°F | Ht 73.0 in | Wt 258.9 lb

## 2016-05-16 DIAGNOSIS — E669 Obesity, unspecified: Secondary | ICD-10-CM

## 2016-05-16 DIAGNOSIS — E282 Polycystic ovarian syndrome: Secondary | ICD-10-CM

## 2016-05-16 DIAGNOSIS — E118 Type 2 diabetes mellitus with unspecified complications: Secondary | ICD-10-CM

## 2016-05-16 DIAGNOSIS — Z6834 Body mass index (BMI) 34.0-34.9, adult: Secondary | ICD-10-CM

## 2016-05-16 DIAGNOSIS — E1165 Type 2 diabetes mellitus with hyperglycemia: Secondary | ICD-10-CM

## 2016-05-16 DIAGNOSIS — Z0000 Encounter for general adult medical examination without abnormal findings: Secondary | ICD-10-CM

## 2016-05-16 DIAGNOSIS — IMO0001 Reserved for inherently not codable concepts without codable children: Secondary | ICD-10-CM

## 2016-05-16 LAB — POCT GLYCOSYLATED HEMOGLOBIN (HGB A1C): HEMOGLOBIN A1C: 8.9

## 2016-05-16 LAB — GLUCOSE, CAPILLARY: Glucose-Capillary: 242 mg/dL — ABNORMAL HIGH (ref 65–99)

## 2016-05-16 LAB — POCT URINE PREGNANCY: Preg Test, Ur: NEGATIVE

## 2016-05-16 MED ORDER — TUBERCULIN PPD 5 UNIT/0.1ML ID SOLN
5.0000 [IU] | Freq: Once | INTRADERMAL | Status: AC
  Administered 2016-05-16: 5 [IU] via INTRADERMAL

## 2016-05-16 MED ORDER — METFORMIN HCL 500 MG PO TABS: 1000.0000 mg | ORAL_TABLET | Freq: Two times a day (BID) | ORAL | 2 refills | Status: DC

## 2016-05-16 MED FILL — metFORMIN HCL 500 MG TABS: 500 | 30 days supply | Qty: 120 | Fill #0

## 2016-05-16 NOTE — Assessment & Plan Note (Addendum)
Continues to have irregular periods, last period on February.   Will check urine pregnancy test today.  >> negative  -restarted metformin today

## 2016-05-16 NOTE — Progress Notes (Signed)
   CC: DM II f/up and med refill  HPI:  [name] is a 36 y.o. with PMH as listed below here for DM II f/up and med refill  Past Medical History:  Diagnosis Date  . Chest pain    multiple ED evaluations, most likely musculoskeletal (cervical radiculopathy), needs MRI spine  . Diabetes mellitus    pt states doesn't know if has DM  . GERD (gastroesophageal reflux disease)   . Gestational diabetes   . PCOD (polycystic ovarian disease)    Followed by [facility], has been on metformin   Has uncontrolled DM II, last hgba1c was 9.9 on 08/2014. Has not seen [place] since that visit. On metformin 592m daily previously but has not been taking anything recently. Drinks regular soda throughout the day. Has polydipsia but no polyuria.   Has hx of PCOS, has irregular periods, last period 2 months ago.    Review of Systems:   Review of Systems  Constitutional: Negative for chills and fever.  Respiratory: Negative for cough.   Cardiovascular: Negative for chest pain and palpitations.  Gastrointestinal: Negative for abdominal pain, constipation, diarrhea, heartburn, nausea and vomiting.  Genitourinary: Negative for flank pain, frequency and hematuria.  Neurological: Negative for dizziness, tingling, tremors, sensory change, speech change and headaches.  Endo/Heme/Allergies: Positive for polydipsia.     Physical Exam:  Vitals:   05/16/16 1527  BP: (!) 116/42  Pulse: 77  Temp: 98.1 F (36.7 C)  TempSrc: Oral  SpO2: 100%  Weight: 258 lb 14.4 oz (117.4 kg)  Height: 6' 1"  (1.854 m)    Physical Exam  Constitutional: She is oriented to person, place, and time. She appears well-developed and well-nourished.  Obese female  HENT:  Head: Atraumatic.  Cardiovascular: Normal rate and regular rhythm.  Exam reveals no gallop and no friction rub.   No murmur heard. Respiratory: Effort normal and breath sounds normal. No respiratory distress. She has no wheezes.  GI: Soft. Bowel sounds  are normal.  Musculoskeletal: Normal range of motion. She exhibits no edema.  5/5 str b/l Sensation intact Has calluses on both feet plantar surface, no open wounds.   Neurological: She is alert and oriented to person, place, and time.  Psychiatric: She has a normal mood and affect.    Assessment & Plan:   See Encounters Tab for problem based charting.  Patient discussed with Dr. EAngelia Mould

## 2016-05-16 NOTE — Progress Notes (Signed)
Internal Medicine Clinic Attending  Case discussed with Dr. Ahmed at the time of the visit.  We reviewed the resident's history and exam and pertinent patient test results.  I agree with the assessment, diagnosis, and plan of care documented in the resident's note. 

## 2016-05-16 NOTE — Assessment & Plan Note (Addendum)
DM II remains uncontrolled, today hgba1c 8.9, last seen on 08/2014 when her hgba1c was 9.9. Spent time talking about the important of controlling her Diabetes. She was under the impression that without insurance she will not be able to get any meds. Talked to her about our resources that we can provide to her given that she comes in for close f/up. She wants to start off fresh and get her diabetes under control  - restart metformin slowly uptitrate to 2045m daily. Gave instructions to pts how to do this - f/up in 3 months - refer to diabetic educator - checked BMET, urine microalbumin.

## 2016-05-16 NOTE — Patient Instructions (Signed)
Take metformin 566m tablet as instructed.  Switch to diet soda  See the dietician for your diabetes.  Come back in 3 months.

## 2016-05-16 NOTE — Assessment & Plan Note (Signed)
>>  ASSESSMENT AND PLAN FOR DIABETES TYPE 2, UNCONTROLLED WRITTEN ON 05/16/2016  4:10 PM BY AHMED, TASRIF, MD  DM II remains uncontrolled, today hgba1c 8.9, last seen on 08/2014 when her hgba1c was 9.9. Spent time talking about the important of controlling her Diabetes. She was under the impression that without insurance she will not be able to get any meds. Talked to her about our resources that we can provide to her given that she comes in for close f/up. She wants to start off fresh and get her diabetes under control  - restart metformin slowly uptitrate to 2000mg  daily. Gave instructions to pts how to do this - f/up in 3 months - refer to diabetic educator - checked BMET, urine microalbumin.

## 2016-05-16 NOTE — Assessment & Plan Note (Signed)
Requesting PPD for job purpose.

## 2016-05-17 LAB — BASIC METABOLIC PANEL
BUN / CREAT RATIO: 11 (ref 9–23)
BUN: 7 mg/dL (ref 6–20)
CHLORIDE: 104 mmol/L (ref 96–106)
CO2: 19 mmol/L (ref 18–29)
Calcium: 8.5 mg/dL — ABNORMAL LOW (ref 8.7–10.2)
Creatinine, Ser: 0.62 mg/dL (ref 0.57–1.00)
GFR calc Af Amer: 133 mL/min/{1.73_m2} (ref >59)
GFR calc non Af Amer: 116 mL/min/{1.73_m2} (ref >59)
GLUCOSE: 227 mg/dL — AB (ref 65–99)
Potassium: 3.6 mmol/L (ref 3.5–5.2)
Sodium: 140 mmol/L (ref 134–144)

## 2016-05-17 LAB — MICROALBUMIN / CREATININE URINE RATIO
CREATININE, UR: 304.1 mg/dL
Microalb/Creat Ratio: 4.3 mg/g creat (ref 0.0–30.0)
Microalbumin, Urine: 13.2 ug/mL

## 2016-05-19 DIAGNOSIS — Z111 Encounter for screening for respiratory tuberculosis: Secondary | ICD-10-CM

## 2016-05-19 LAB — TB SKIN TEST
Induration: 0 mm
TB Skin Test: NEGATIVE

## 2016-08-07 ENCOUNTER — Ambulatory Visit (INDEPENDENT_AMBULATORY_CARE_PROVIDER_SITE_OTHER): Payer: Self-pay | Admitting: *Deleted

## 2016-08-07 ENCOUNTER — Telehealth: Payer: Self-pay | Admitting: *Deleted

## 2016-08-07 ENCOUNTER — Encounter: Payer: Self-pay | Admitting: *Deleted

## 2016-08-07 DIAGNOSIS — Z0000 Encounter for general adult medical examination without abnormal findings: Secondary | ICD-10-CM

## 2016-08-07 DIAGNOSIS — Z111 Encounter for screening for respiratory tuberculosis: Secondary | ICD-10-CM

## 2016-08-07 DIAGNOSIS — Z23 Encounter for immunization: Secondary | ICD-10-CM

## 2016-08-07 NOTE — Progress Notes (Signed)
Pt knows to return in 2 days to read PPD test.

## 2016-08-07 NOTE — Telephone Encounter (Signed)
States she needs second PPD test for her job. First one done in May. She will come today @ 1430 PM.

## 2016-10-17 ENCOUNTER — Encounter: Payer: Self-pay | Admitting: Internal Medicine

## 2016-10-17 NOTE — Progress Notes (Deleted)
   CC: ***  HPI:  Ms.Leverne R Pierce-Younger is a 36 y.o. with a PMH of T2DM, tobacco use, and polycystic ovarian disease presenting to clinic for ***  T2DM: A1c in May 2018 was 8.9 at which time she was off of metformin; this was restarted and patient was to titrate up to 1031m BID.  Please see problem based Assessment and Plan for status of patients chronic conditions.  Past Medical History:  Diagnosis Date  . Chest pain    multiple ED evaluations, most likely musculoskeletal (cervical radiculopathy), needs MRI spine  . Diabetes mellitus    pt states doesn't know if has DM  . GERD (gastroesophageal reflux disease)   . Gestational diabetes   . PCOD (polycystic ovarian disease)    [facility] has been [facility]    Review of Systems:   ROS  Physical Exam:  There were no vitals filed for this visit. GENERAL- alert, co-operative, appears as stated age, not in any distress. HEENT- Atraumatic, normocephalic, PERRL, EOMI, oral mucosa appears moist CARDIAC- RRR, no murmurs, rubs or gallops. RESP- Moving equal volumes of air, and clear to auscultation bilaterally, no wheezes or crackles. ABDOMEN- Soft, nontender, bowel sounds present. NEURO- No obvious Cr N abnormality. EXTREMITIES- pulse 2+, symmetric, no pedal edema. SKIN- Warm, dry, No rash or lesion. PSYCH- Normal mood and affect, appropriate thought content and speech.  Assessment & Plan:   See Encounters Tab for problem based charting.   Patient {GC/GE:[id number]::"discussed with","seen with"} Dr. {NAMES:[id number]::"Butcher","Granfortuna","E. Hoffman","Klima","Mullen","Narendra","Vincent"}   GAlphonzo Grieve MD Internal Medicine PGY2

## 2016-10-18 ENCOUNTER — Encounter: Payer: Self-pay | Admitting: Internal Medicine

## 2016-12-04 ENCOUNTER — Emergency Department (HOSPITAL_COMMUNITY)
Admission: EM | Admit: 2016-12-04 | Discharge: 2016-12-04 | Disposition: A | Discharge status: Home / Self Care | Payer: Self-pay | Attending: Emergency Medicine | Admitting: Emergency Medicine

## 2016-12-04 ENCOUNTER — Ambulatory Visit: Payer: Self-pay

## 2016-12-04 DIAGNOSIS — R079 Chest pain, unspecified: Secondary | ICD-10-CM | POA: Insufficient documentation

## 2016-12-04 DIAGNOSIS — Z5321 Procedure and treatment not carried out due to patient leaving prior to being seen by health care provider: Secondary | ICD-10-CM | POA: Insufficient documentation

## 2016-12-04 LAB — CBC
HEMATOCRIT: 41.6 % (ref 36.0–46.0)
HEMOGLOBIN: 13.3 g/dL (ref 12.0–15.0)
MCH: 27.4 pg (ref 26.0–34.0)
MCHC: 32 g/dL (ref 30.0–36.0)
MCV: 85.8 fL (ref 78.0–100.0)
Platelets: 271 10*3/uL (ref 150–400)
RBC: 4.85 MIL/uL (ref 3.87–5.11)
RDW: 13.6 % (ref 11.5–15.5)
WBC: 10.1 10*3/uL (ref 4.0–10.5)

## 2016-12-04 LAB — BASIC METABOLIC PANEL
ANION GAP: 8 (ref 5–15)
BUN: 8 mg/dL (ref 6–20)
CHLORIDE: 106 mmol/L (ref 101–111)
CO2: 23 mmol/L (ref 22–32)
Calcium: 9 mg/dL (ref 8.9–10.3)
Creatinine, Ser: 0.6 mg/dL (ref 0.44–1.00)
GFR calc Af Amer: >60 mL/min (ref >60)
GLUCOSE: 197 mg/dL — AB (ref 65–99)
POTASSIUM: 3.8 mmol/L (ref 3.5–5.1)
Sodium: 137 mmol/L (ref 135–145)

## 2016-12-04 LAB — I-STAT TROPONIN, ED: Troponin i, poc: 0 ng/mL (ref 0.00–0.08)

## 2016-12-04 LAB — I-STAT BETA HCG BLOOD, ED (MC, WL, AP ONLY)

## 2016-12-04 NOTE — ED Triage Notes (Signed)
Pt to ER for evaluation of central/right chest pressure onset 3 days ago. States today it began radiating down right arm and into right neck. Pt is a cigarette smoker 1/2 pack per day. States diabetes hx. NAD at present.

## 2016-12-04 NOTE — ED Notes (Signed)
Pt states she is no longer waiting to see an MD. RN advised against leaving. Pt continued to walk out the door.

## 2017-01-02 DIAGNOSIS — K5792 Diverticulitis of intestine, part unspecified, without perforation or abscess without bleeding: Secondary | ICD-10-CM

## 2017-01-02 HISTORY — DX: Diverticulitis of intestine, part unspecified, without perforation or abscess without bleeding: K57.92

## 2017-02-28 ENCOUNTER — Encounter: Payer: Self-pay | Admitting: Internal Medicine

## 2017-02-28 ENCOUNTER — Encounter (INDEPENDENT_AMBULATORY_CARE_PROVIDER_SITE_OTHER): Payer: Self-pay

## 2017-02-28 ENCOUNTER — Other Ambulatory Visit: Payer: Self-pay

## 2017-02-28 ENCOUNTER — Ambulatory Visit (INDEPENDENT_AMBULATORY_CARE_PROVIDER_SITE_OTHER): Payer: Self-pay | Admitting: Internal Medicine

## 2017-02-28 VITALS — BP 137/60 | HR 80 | Temp 98.4°F | Ht 73.0 in | Wt 265.5 lb

## 2017-02-28 DIAGNOSIS — E282 Polycystic ovarian syndrome: Secondary | ICD-10-CM

## 2017-02-28 DIAGNOSIS — Z6835 Body mass index (BMI) 35.0-35.9, adult: Secondary | ICD-10-CM

## 2017-02-28 DIAGNOSIS — R51 Headache: Secondary | ICD-10-CM

## 2017-02-28 DIAGNOSIS — Z794 Long term (current) use of insulin: Secondary | ICD-10-CM

## 2017-02-28 DIAGNOSIS — E119 Type 2 diabetes mellitus without complications: Secondary | ICD-10-CM

## 2017-02-28 DIAGNOSIS — H53149 Visual discomfort, unspecified: Secondary | ICD-10-CM

## 2017-02-28 DIAGNOSIS — G4440 Drug-induced headache, not elsewhere classified, not intractable: Secondary | ICD-10-CM

## 2017-02-28 DIAGNOSIS — R519 Headache, unspecified: Secondary | ICD-10-CM | POA: Insufficient documentation

## 2017-02-28 DIAGNOSIS — E1165 Type 2 diabetes mellitus with hyperglycemia: Secondary | ICD-10-CM

## 2017-02-28 DIAGNOSIS — G4452 New daily persistent headache (NDPH): Secondary | ICD-10-CM

## 2017-02-28 DIAGNOSIS — Z23 Encounter for immunization: Secondary | ICD-10-CM

## 2017-02-28 DIAGNOSIS — R208 Other disturbances of skin sensation: Secondary | ICD-10-CM

## 2017-02-28 DIAGNOSIS — E669 Obesity, unspecified: Secondary | ICD-10-CM

## 2017-02-28 LAB — POCT GLYCOSYLATED HEMOGLOBIN (HGB A1C): Hemoglobin A1C: 7.2

## 2017-02-28 LAB — GLUCOSE, CAPILLARY: GLUCOSE-CAPILLARY: 275 mg/dL — AB (ref 65–99)

## 2017-02-28 MED ORDER — LIRAGLUTIDE 18 MG/3ML ~~LOC~~ SOPN: 0.6000 mg | PEN_INJECTOR | Freq: Every day | SUBCUTANEOUS | 3 refills | Status: DC

## 2017-02-28 MED ORDER — LIRAGLUTIDE 18 MG/3ML ~~LOC~~ SOPN: 0.6000 mg | PEN_INJECTOR | Freq: Every day | SUBCUTANEOUS | 2 refills | Status: DC

## 2017-02-28 NOTE — Progress Notes (Signed)
   CC: headache  HPI:  [name] is a 37 y.o. with a PMH of T2DM, PCOS presenting to clinic with headache and for follow up on DM.  T2DM: Last A1c 8.9 in May 2018 at which time patient was restarted on metformin; she has not been back since. She states she did take and titrate up metformin for 3 weeks however had severe abdominal pain and constipation so she stopped; her symptoms resolved on stopping metformin. She denies intentional or unintentional weight loss, has not increased her activity/exercise and does not follow a particular diet like previous providers had discussed with her. She denies increased thirst, polyuria, nausea, vomiting, dizziness. She does not check her CBGs.   Headache: Patient endorses 4 day history of intermittent headache that is helped with tylenol for about an hour and then returns; she is able to sleep without trouble. She endorses some photophobia, however denies phonophobia. She denies vision or hearing changes, neck pain, nausea, vomiting. She endorses some sedation in her left extremities, denies focal weakness, dysphasia, dysarthria, unstable gait, and coordination. Changing position does not affect her pain. She is not on OCPs.  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  Please see problem based Assessment and Plan for status of patients chronic conditions.  Past Medical History:  Diagnosis Date  . Chest pain    multiple ED evaluations, most likely musculoskeletal (cervical radiculopathy), needs MRI spine  . Diabetes mellitus   . GERD (gastroesophageal reflux disease)   . Gestational diabetes   . PCOD (polycystic ovarian disease)    Followed by [facility], has been on metformin    Review of Systems:   ROS Per HPI  Physical Exam:  Vitals:   02/28/17 1527  BP: 137/60  Pulse: 80  Temp: 98.4 F (36.9 C)  TempSrc: Oral  Weight: 265 lb 8 oz (120.4 kg)   Height: 6' 1"  (1.854 m)   GENERAL- alert, co-operative, appears as stated age, not in any distress. HEENT- Atraumatic, normocephalic, PERRL, EOMI, oral mucosa appears moist CARDIAC- RRR, no murmurs, rubs or gallops. RESP- Moving equal volumes of air, and clear to auscultation bilaterally, no wheezes or crackles. ABDOMEN- Soft, nontender, bowel sounds present. NEURO- CN 2-12 intact, bil upper and lower extremity strength intact; subjective decreased sensation in left upper and lower extremity but not on face. Visual fields intact; finger to nose intact bil; rapid alternating hand movement intact, gait normal. EXTREMITIES- pulse 2+, symmetric, no pedal edema. SKIN- Warm, dry, no rash or lesion. PSYCH- Normal mood and affect, appropriate thought content and speech.  Assessment & Plan:   See Encounters Tab for problem based charting.   Patient discussed with Dr. Abelardo Diesel, MD Internal Medicine PGY2

## 2017-02-28 NOTE — Patient Instructions (Signed)
For your headache, make sure you stay hydrated with water and eat regular meals throughout the day. I think your headaches may be caused by using too much tylenol; try to stop using tylenol, you can take an ibuprofen on days that your headache is really affecting you. We will order a scan of your head which you should be hearing about an appt in the next few days.  For your diabetes, we will start victoza, an injectable diabetes medicine. Take 0.74m a day for a week then increase to 0.274mper day afterwards if you are tolerating it well.

## 2017-03-04 ENCOUNTER — Encounter: Payer: Self-pay | Admitting: Internal Medicine

## 2017-03-04 NOTE — Assessment & Plan Note (Addendum)
Last A1c 8.9 in May 2018 at which time patient was restarted on metformin; she has not been back since. She states she did take and titrate up metformin for 3 weeks however had severe abdominal pain and constipation so she stopped; her symptoms resolved on stopping metformin. She denies intentional or unintentional weight loss, has not increased her activity/exercise and does not follow a particular diet like previous providers had discussed with her. She denies increased thirst, polyuria, nausea, vomiting, dizziness. She does not check her CBGs.    Plan: --A1c today 7.2 --as patient is obese with PCOS, will start liraglutide to help with weight loss --advised decreased soda intake (focus on water), avoiding fried foods, white breads; increasing activity --Appreciate Dr. Maudie Mercury providing clinic sample and teaching patient proper use --given script for [facility] as patient w/o medical or prescription coverage --advised to meet with financial counselor to assess if eligible for [place] card or Medicaid; will plan to refer to ophtho when she gets coverage. --f/u in 3 months

## 2017-03-04 NOTE — Assessment & Plan Note (Signed)
>>  ASSESSMENT AND PLAN FOR DIABETES TYPE 2, UNCONTROLLED WRITTEN ON 03/04/2017  6:18 PM BY Nyra Market, MD  Last A1c 8.9 in May 2018 at which time patient was restarted on metformin; she has not been back since. She states she did take and titrate up metformin for 3 weeks however had severe abdominal pain and constipation so she stopped; her symptoms resolved on stopping metformin. She denies intentional or unintentional weight loss, has not increased her activity/exercise and does not follow a particular diet like previous providers had discussed with her. She denies increased thirst, polyuria, nausea, vomiting, dizziness. She does not check her CBGs.    Plan: --A1c today 7.2 --as patient is obese with PCOS, will start liraglutide to help with weight loss --advised decreased soda intake (focus on water), avoiding fried foods, white breads; increasing activity --Appreciate Dr. Selena Batten providing clinic sample and teaching patient proper use --given script for [facility] as patient w/o medical or prescription coverage --advised to meet with financial counselor to assess if eligible for [place] card or Medicaid; will plan to refer to ophtho when she gets coverage. --f/u in 3 months

## 2017-03-04 NOTE — Assessment & Plan Note (Signed)
Patient endorses 4 day history of intermittent headache that is helped with tylenol for about an hour and then returns; she is able to sleep without trouble. She endorses some photophobia, however denies phonophobia. She denies vision or hearing changes, neck pain, nausea, vomiting. She endorses some sedation in her left extremities, denies focal weakness, dysphasia, dysarthria, unstable gait, and coordination. Changing position does not affect her pain. She is not on OCPs. She denies head trauma. 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  On exam CN 2-12 were intact, strength was intact. She had subjective decreased sensation in left extremities.   Her headache could be 2/2 analgesic overuse; however would not expect decreased sensation. No s/s at this time to point to pseudotumor cerebri, though she is obese. Stroke unlikely but possible with decreased sensation.  Exam and history not consistent enough for emergent head imaging. Patient is uninsured.  Plan: --CT head wo contrast - patient to work with our Development worker, community to get urgent coverage for this. Advised that if symptoms were to worsen or new neurological symptoms arise before we are able to get CT head, no present to ED for evaluation. --advised to try to avoid tylenol use; if absolutely necessary, to use ibuprofen --advised to increase water intake, eat reg meals throughout the day, have stable sleep schedule.

## 2017-03-05 ENCOUNTER — Ambulatory Visit: Payer: Medicaid Other

## 2017-03-05 ENCOUNTER — Encounter: Payer: Self-pay | Admitting: Internal Medicine

## 2017-03-05 ENCOUNTER — Telehealth: Payer: Self-pay | Admitting: *Deleted

## 2017-03-05 NOTE — Progress Notes (Signed)
Internal Medicine Clinic Attending  Case discussed with Dr. Svalina  at the time of the visit.  We reviewed the resident's history and exam and pertinent patient test results.  I agree with the assessment, diagnosis, and plan of care documented in the resident's note.  

## 2017-03-05 NOTE — Telephone Encounter (Signed)
CALLED PATIENT LEFT VOICE MESSAGE REGARDING REFERRAL FOR EYE EXAM AND IF SHE IS ABLE TO PAY OUT OF POCKET / HER INSURANCE IS FOR FAMILY PLANNING MEDICAID. WAITING FOR CALL BACK.

## 2017-03-06 ENCOUNTER — Encounter: Payer: Self-pay | Admitting: *Deleted

## 2017-03-26 ENCOUNTER — Ambulatory Visit (HOSPITAL_COMMUNITY): Payer: Self-pay | Attending: Internal Medicine

## 2017-08-13 ENCOUNTER — Encounter (HOSPITAL_COMMUNITY): Payer: Self-pay | Admitting: *Deleted

## 2017-08-13 ENCOUNTER — Other Ambulatory Visit: Payer: Self-pay

## 2017-08-13 ENCOUNTER — Emergency Department (HOSPITAL_COMMUNITY)
Admission: EM | Admit: 2017-08-13 | Discharge: 2017-08-13 | Disposition: A | Discharge status: Home / Self Care | Payer: Self-pay | Attending: Emergency Medicine | Admitting: Emergency Medicine

## 2017-08-13 ENCOUNTER — Emergency Department (HOSPITAL_COMMUNITY): Payer: Self-pay

## 2017-08-13 DIAGNOSIS — Z79899 Other long term (current) drug therapy: Secondary | ICD-10-CM | POA: Insufficient documentation

## 2017-08-13 DIAGNOSIS — F17210 Nicotine dependence, cigarettes, uncomplicated: Secondary | ICD-10-CM | POA: Insufficient documentation

## 2017-08-13 DIAGNOSIS — E119 Type 2 diabetes mellitus without complications: Secondary | ICD-10-CM | POA: Insufficient documentation

## 2017-08-13 DIAGNOSIS — K5792 Diverticulitis of intestine, part unspecified, without perforation or abscess without bleeding: Secondary | ICD-10-CM | POA: Insufficient documentation

## 2017-08-13 LAB — CBC
HCT: 40.2 % (ref 36.0–46.0)
Hemoglobin: 12.9 g/dL (ref 12.0–15.0)
MCH: 27.2 pg (ref 26.0–34.0)
MCHC: 32.1 g/dL (ref 30.0–36.0)
MCV: 84.8 fL (ref 78.0–100.0)
PLATELETS: 260 10*3/uL (ref 150–400)
RBC: 4.74 MIL/uL (ref 3.87–5.11)
RDW: 13.6 % (ref 11.5–15.5)
WBC: 14 10*3/uL — ABNORMAL HIGH (ref 4.0–10.5)

## 2017-08-13 LAB — COMPREHENSIVE METABOLIC PANEL
ALBUMIN: 3.8 g/dL (ref 3.5–5.0)
ALK PHOS: 63 U/L (ref 38–126)
ALT: 18 U/L (ref 0–44)
AST: 16 U/L (ref 15–41)
Anion gap: 10 (ref 5–15)
BUN: 6 mg/dL (ref 6–20)
CALCIUM: 8.9 mg/dL (ref 8.9–10.3)
CHLORIDE: 108 mmol/L (ref 98–111)
CO2: 24 mmol/L (ref 22–32)
CREATININE: 0.61 mg/dL (ref 0.44–1.00)
GFR calc Af Amer: >60 mL/min (ref >60)
GFR calc non Af Amer: >60 mL/min (ref >60)
GLUCOSE: 209 mg/dL — AB (ref 70–99)
Potassium: 3.5 mmol/L (ref 3.5–5.1)
SODIUM: 142 mmol/L (ref 135–145)
Total Bilirubin: 0.6 mg/dL (ref 0.3–1.2)
Total Protein: 7.5 g/dL (ref 6.5–8.1)

## 2017-08-13 LAB — I-STAT BETA HCG BLOOD, ED (MC, WL, AP ONLY): I-stat hCG, quantitative: <5 m[IU]/mL (ref <5)

## 2017-08-13 LAB — URINALYSIS, ROUTINE W REFLEX MICROSCOPIC
Bacteria, UA: NONE SEEN
Bilirubin Urine: NEGATIVE
KETONES UR: NEGATIVE mg/dL
Nitrite: NEGATIVE
PROTEIN: NEGATIVE mg/dL
Specific Gravity, Urine: 1.033 — ABNORMAL HIGH (ref 1.005–1.030)
pH: 5 (ref 5.0–8.0)

## 2017-08-13 LAB — LIPASE, BLOOD: LIPASE: 28 U/L (ref 11–51)

## 2017-08-13 MED ORDER — HYDROCODONE-ACETAMINOPHEN 5-325 MG PO TABS: 1.0000 | ORAL_TABLET | ORAL | 0 refills | Status: DC | PRN

## 2017-08-13 MED ORDER — CIPROFLOXACIN HCL 500 MG PO TABS: 500.0000 mg | ORAL_TABLET | Freq: Two times a day (BID) | ORAL | 0 refills | Status: DC

## 2017-08-13 MED ORDER — IOPAMIDOL (ISOVUE-300) INJECTION 61%
INTRAVENOUS | Status: AC
  Filled 2017-08-13: qty 100

## 2017-08-13 MED ORDER — HYDROCODONE-ACETAMINOPHEN 5-325 MG PO TABS
2.0000 | ORAL_TABLET | Freq: Once | ORAL | Status: AC
  Administered 2017-08-13: 2 via ORAL
  Filled 2017-08-13: qty 2

## 2017-08-13 MED ORDER — METRONIDAZOLE 500 MG PO TABS: 500.0000 mg | ORAL_TABLET | Freq: Two times a day (BID) | ORAL | 0 refills | Status: DC

## 2017-08-13 MED ORDER — CIPROFLOXACIN HCL 500 MG PO TABS
500.0000 mg | ORAL_TABLET | Freq: Once | ORAL | Status: AC
  Administered 2017-08-13: 500 mg via ORAL
  Filled 2017-08-13: qty 1

## 2017-08-13 MED ORDER — METRONIDAZOLE 500 MG PO TABS
500.0000 mg | ORAL_TABLET | Freq: Once | ORAL | Status: DC
  Filled 2017-08-13: qty 1

## 2017-08-13 MED ORDER — METRONIDAZOLE 500 MG PO TABS
500.0000 mg | ORAL_TABLET | Freq: Once | ORAL | Status: AC
  Administered 2017-08-13: 500 mg via ORAL
  Filled 2017-08-13: qty 1

## 2017-08-13 MED ORDER — IOPAMIDOL (ISOVUE-300) INJECTION 61%
100.0000 mL | Freq: Once | INTRAVENOUS | Status: AC | PRN
  Administered 2017-08-13: 100 mL via INTRAVENOUS

## 2017-08-13 NOTE — ED Provider Notes (Signed)
[facility] DEPT Provider Note   CSN: [id number] Arrival date & time: 08/13/17  1926     History   Chief Complaint Chief Complaint  Patient presents with  . Abdominal Pain    HPI Misty [name] is a 37 y.o. female.  HPI   She presents for evaluation of diffuse abdominal pain which began today.  No bowel movements for 3 days.  She denies dysuria, urinary frequency hematuria or urgency.  Her abdominal pain radiates to the bilateral lower back.  She denies fever, cough, chills, chest pain or upper back pain.  No prior similar problems.  History of cesarean section.  No other abdominal surgeries.  There are no other no modifying factors.   Past Medical History:  Diagnosis Date  . Chest pain    multiple ED evaluations, most likely musculoskeletal (cervical radiculopathy), needs MRI spine  . Diabetes mellitus   . GERD (gastroesophageal reflux disease)   . Gestational diabetes   . PCOD (polycystic ovarian disease)    Followed by [facility], has been on metformin    Patient Active Problem List   Diagnosis Date Noted  . Headache 02/28/2017  . Heart palpitations 06/04/2012  . Tobacco use 06/03/2012  . Preventative health care 05/03/2011  . Diabetes type 2, uncontrolled ([place]) 02/24/2008  . Polycystic ovarian disease 07/27/2006  . GERD 03/02/2006    Past Surgical History:  Procedure Laterality Date  . CESAREAN SECTION  12/14/2011   Procedure: CESAREAN SECTION;  Surgeon: Lahoma Crocker, MD;  Location: [facility];  Service: Obstetrics;  Laterality: N/A;  Primary Cesarean Section Delivery Girl @ 0616, Apgars     OB History    Gravida  1   Para  1   Term  1   Preterm  0   AB  0   Living  1     SAB  0   TAB  0   Ectopic  0   Multiple  0   Live Births  1            Home Medications    Prior to Admission medications   Medication Sig Start Date End Date Taking? Authorizing Provider  acetaminophen (TYLENOL) 500 MG  tablet Take 1,500 mg by mouth every 6 (six) hours as needed (pain.).   Yes [provider]  ibuprofen (ADVIL,MOTRIN) 200 MG tablet Take 400 mg by mouth every 6 (six) hours as needed (pain.).   Yes [provider]  TRAMADOL-ACETAMINOPHEN PO Take by mouth.   Yes [provider]  ciprofloxacin (CIPRO) 500 MG tablet Take 1 tablet (500 mg total) by mouth 2 (two) times daily. One po bid x 10 days 08/13/17   Daleen Bo, MD  HYDROcodone-acetaminophen [facility]) 5-325 MG tablet Take 1 tablet by mouth every 4 (four) hours as needed. 08/13/17   Daleen Bo, MD  liraglutide (VICTOZA) 18 MG/3ML SOPN Inject 0.1 mLs (0.6 mg total) into the skin daily. Increase to 0.59m daily after one week. Patient not taking: Reported on 08/13/2017 02/28/17   SAlphonzo Grieve MD  metroNIDAZOLE (FLAGYL) 500 MG tablet Take 1 tablet (500 mg total) by mouth 2 (two) times daily. One po bid x 10 days 08/13/17   WDaleen Bo MD    Family History Family History  Problem Relation Age of Onset  . Diabetes Mother   . Hypertension Mother   . Stomach cancer Mother        diet at 558 . Heart disease Father  diet at 60  . Diabetes Sister   . Hypertension Sister   . Diabetes Brother   . Cancer Maternal Grandmother   . Cancer Paternal Grandmother     Social History Social History   Tobacco Use  . Smoking status: Current Every Day Smoker    Packs/day: 0.50    Types: Cigarettes  . Smokeless tobacco: Never Used  . Tobacco comment: 0.5ppd  Substance Use Topics  . Alcohol use: No    Alcohol/week: 0.0 standard drinks  . Drug use: No     Allergies   Patient has no known allergies.   Review of Systems Review of Systems  All other systems reviewed and are negative.    Physical Exam Updated Vital Signs BP (!) 156/80 (BP Location: Left Arm)   Pulse 88   Temp 99.2 F (37.3 C) (Oral)   Resp 20   Ht 5' 11"  (1.803 m)   Wt 117.9 kg   LMP 08/09/2017 (Exact Date)   SpO2 100%   BMI  36.26 kg/m   Physical Exam  Constitutional: She is oriented to person, place, and time. She appears well-developed. She does not appear ill.  Obese  HENT:  Head: Normocephalic and atraumatic.  Eyes: Pupils are equal, round, and reactive to light. Conjunctivae and EOM are normal.  Neck: Normal range of motion and phonation normal. Neck supple.  Cardiovascular: Normal rate and regular rhythm.  Pulmonary/Chest: Effort normal and breath sounds normal. She exhibits no tenderness.  Abdominal: Soft. She exhibits no distension and no mass. There is tenderness (Diffuse, moderate). There is no rebound and no guarding.  Genitourinary:  Genitourinary Comments: No costovertebral angle tenderness with percussion.  Musculoskeletal: Normal range of motion. She exhibits tenderness (Mild lumbar tenderness bilaterally.  Normal range of motion back.).  Neurological: She is alert and oriented to person, place, and time. She exhibits normal muscle tone.  Skin: Skin is warm and dry.  Psychiatric: She has a normal mood and affect. Her behavior is normal. Judgment and thought content normal.  Nursing note and vitals reviewed.    ED Treatments / Results  Labs (all labs ordered are listed, but only abnormal results are displayed) Labs Reviewed  COMPREHENSIVE METABOLIC PANEL - Abnormal; Notable for the following components:      Result Value   Glucose, Bld 209 (*)    All other components within normal limits  CBC - Abnormal; Notable for the following components:   WBC 14.0 (*)    All other components within normal limits  URINALYSIS, ROUTINE W REFLEX MICROSCOPIC - Abnormal; Notable for the following components:   Specific Gravity, Urine 1.033 (*)    Glucose, UA >=500 (*)    Hgb urine dipstick MODERATE (*)    Leukocytes, UA TRACE (*)    All other components within normal limits  LIPASE, BLOOD  I-STAT BETA HCG BLOOD, ED ([facility], [facility], [facility] ONLY)    EKG None  Radiology Ct Abdomen Pelvis W Contrast  Result  Date: 08/13/2017 CLINICAL DATA:  Abdominal pain since 11 o'clock this morning. EXAM: CT ABDOMEN AND PELVIS WITH CONTRAST TECHNIQUE: Multidetector CT imaging of the abdomen and pelvis was performed using the standard protocol following bolus administration of intravenous contrast. CONTRAST:  158m ISOVUE-300 IOPAMIDOL (ISOVUE-300) INJECTION 61% COMPARISON:  None. FINDINGS: Lower chest: Mild dependent changes in the lung bases. Hepatobiliary: Mild diffuse fatty infiltration of the liver. No focal liver lesions identified. Gallbladder and bile ducts are unremarkable. Pancreas: Unremarkable. No pancreatic ductal dilatation or surrounding inflammatory  changes. Spleen: Normal in size without focal abnormality. Adrenals/Urinary Tract: Adrenal glands are unremarkable. Kidneys are normal, without renal calculi, focal lesion, or hydronephrosis. Bladder is unremarkable. Stomach/Bowel: Stomach, small bowel, and colon are not abnormally distended. Scattered diverticula in the sigmoid colon. There is suggestion of mild infiltration in the fat around the junction of the rectosigmoid colon with mild free fluid. This likely indicates early acute diverticulitis. No abscess identified. Appendix is normal. Vascular/Lymphatic: Aortic atherosclerosis. No enlarged abdominal or pelvic lymph nodes. Reproductive: Uterus and bilateral adnexa are unremarkable. Other: No abdominal wall hernia or abnormality. No abdominopelvic ascites. Musculoskeletal: No acute or significant osseous findings. IMPRESSION: Mild infiltration in the fat around the junction of the rectosigmoid colon consistent with early acute diverticulitis. No abscess. Diffuse fatty infiltration of the liver. Electronically Signed   By: Lucienne Capers M.D.   On: 08/13/2017 21:28    Procedures Procedures (including critical care time)  Medications Ordered in ED Medications  iopamidol (ISOVUE-300) 61 % injection 100 mL (100 mLs Intravenous Contrast Given 08/13/17 2106)    ciprofloxacin (CIPRO) tablet 500 mg (500 mg Oral Given 08/13/17 2251)  metroNIDAZOLE (FLAGYL) tablet 500 mg (500 mg Oral Given 08/13/17 2251)  HYDROcodone-acetaminophen (NORCO/VICODIN) 5-325 MG per tablet 2 tablet (2 tablets Oral Given 08/13/17 2251)     Initial Impression / Assessment and Plan / ED Course  I have reviewed the triage vital signs and the nursing notes.  Pertinent labs & imaging results that were available during my care of the patient were reviewed by me and considered in my medical decision making (see chart for details).  Clinical Course as of Aug 14 1121  Mon Aug 13, 2017  2141 Normal  I-Stat beta hCG blood, ED [EW]  2141 Normal except specific gravity high, glucose increased, hemoglobin moderate, leukocyte Estrace trace  Urinalysis, Routine w reflex microscopic(!) [EW]  2141 Normal except glucose high  Comprehensive metabolic panel(!) [EW]  9562 Normal  Lipase, blood [EW]  2142 Normal except white count high   [EW]  2142 Early diverticulitis left rectal sigmoid colon region, images reviewed by me  CT Abdomen Pelvis W Contrast [EW]    Clinical Course User Index [EW] Daleen Bo, MD     Patient Vitals for the past 24 hrs:  BP Temp Temp src Pulse Resp SpO2 Height Weight  08/13/17 2342 (!) 156/80 - - 88 20 100 % - -  08/13/17 2147 (!) 164/95 - - 99 20 100 % - -  08/13/17 1940 - - - - - - 5' 11"  (1.803 m) 117.9 kg  08/13/17 1937 (!) 148/80 99.2 F (37.3 C) Oral (!) 107 20 100 % - -  08/13/17 1931 - - - - - 100 % - -    At discharge- reevaluation with update and discussion. After initial assessment and treatment, an updated evaluation reveals she is comfortable and has no further complaints.  Findings discussed and questions answered. Daleen Bo   Medical Decision Making: Evaluation consistent with uncomplicated diverticulitis.  Doubt serious bacterial infection, intestinal perforation instability or impending vascular collapse.  CRITICAL  CARE-no Performed by: Daleen Bo   Nursing Notes Reviewed/ Care Coordinated Applicable Imaging Reviewed Interpretation of Laboratory Data incorporated into ED treatment  The patient appears reasonably screened and/or stabilized for discharge and I doubt any other medical condition or other [facility] requiring further screening, evaluation, or treatment in the ED at this time prior to discharge.  Plan: Home Medications-continue routine medications; Home Treatments-gradually advance diet; return here if  the recommended treatment, does not improve the symptoms; Recommended follow up-PCP follow-up 1 week.  Consider GI referral for intestinal evaluation with colonoscopy once stable.     Final Clinical Impressions(s) / ED Diagnoses   Final diagnoses:  Diverticulitis    ED Discharge Orders         Ordered    ciprofloxacin (CIPRO) 500 MG tablet  2 times daily     08/13/17 2333    metroNIDAZOLE (FLAGYL) 500 MG tablet  2 times daily     08/13/17 2333    HYDROcodone-acetaminophen (NORCO) 5-325 MG tablet  Every 4 hours PRN     08/13/17 2333           Daleen Bo, MD 08/14/17 1124

## 2017-08-13 NOTE — ED Triage Notes (Signed)
Per [facility], pt c/o abd pain since 1100 this morning, is diabetic, when she awoke felt a strange sensation in her back bilat that radiated into abd.  Denies blood in urine, no BM since Friday and normally has 1 qd.

## 2017-08-13 NOTE — Discharge Instructions (Addendum)
Call your doctor at the outpatient clinic for an appointment to be seen in 1 week or so.  They can help refer you to a gastroenterologist to be seen for a colonoscopy in 1 or 2 months after the diverticulitis improves.  Start with a clear liquid diet.  Gradually advance to a low fiber diet.  Try to drink plenty of water.  Return here, if needed for problems.  Prescriptions were sent to your pharmacy to start taking tomorrow to improve your condition.

## 2017-08-14 NOTE — ED Notes (Signed)
Pt decided she needed a work note for her job

## 2017-08-14 NOTE — ED Notes (Signed)
Pt called to verify prescriptions were sent to pharmacy. Pharmacy denies receiving them.

## 2017-08-15 ENCOUNTER — Encounter: Payer: Self-pay | Admitting: Internal Medicine

## 2017-08-15 ENCOUNTER — Other Ambulatory Visit: Payer: Self-pay

## 2017-08-15 ENCOUNTER — Ambulatory Visit (INDEPENDENT_AMBULATORY_CARE_PROVIDER_SITE_OTHER): Payer: Self-pay | Admitting: Internal Medicine

## 2017-08-15 ENCOUNTER — Ambulatory Visit: Payer: Medicaid Other | Admitting: Dietician

## 2017-08-15 ENCOUNTER — Encounter: Payer: Self-pay | Admitting: Dietician

## 2017-08-15 ENCOUNTER — Other Ambulatory Visit: Payer: Self-pay | Admitting: Dietician

## 2017-08-15 VITALS — BP 150/85 | HR 127 | Temp 98.2°F | Ht 73.0 in | Wt 253.4 lb

## 2017-08-15 DIAGNOSIS — R110 Nausea: Secondary | ICD-10-CM

## 2017-08-15 DIAGNOSIS — R1084 Generalized abdominal pain: Secondary | ICD-10-CM | POA: Insufficient documentation

## 2017-08-15 DIAGNOSIS — K5900 Constipation, unspecified: Secondary | ICD-10-CM

## 2017-08-15 DIAGNOSIS — E1165 Type 2 diabetes mellitus with hyperglycemia: Secondary | ICD-10-CM

## 2017-08-15 DIAGNOSIS — R1032 Left lower quadrant pain: Secondary | ICD-10-CM

## 2017-08-15 LAB — HM DIABETES EYE EXAM

## 2017-08-15 LAB — GLUCOSE, CAPILLARY: Glucose-Capillary: 185 mg/dL — ABNORMAL HIGH (ref 70–99)

## 2017-08-15 MED ORDER — OXYCODONE-ACETAMINOPHEN 7.5-325 MG PO TABS: 1.0000 | ORAL_TABLET | ORAL | 0 refills | Status: DC | PRN

## 2017-08-15 MED ORDER — OXYCODONE-ACETAMINOPHEN 7.5-325 MG PO TABS
1.0000 | ORAL_TABLET | Freq: Once | ORAL | Status: AC
  Administered 2017-08-15: 1 via ORAL
  Filled 2017-08-15 (×2): qty 1

## 2017-08-15 NOTE — Assessment & Plan Note (Addendum)
Abdominal pain, unclear etiology: 5-day history of diffuse abdominal pain described as pressure-like sensation that worsens with ambulation, touch and rotation of body.  CT abdomen pelvis performed in the ED consistent with early.  She was given a course of ciprofloxacin, Flagyl and Norco at discharge from the ED.  She complains of some nausea though has been able to tolerate p.o. Intake.  I have her working differential diagnosis of diverticulitis vs pelvic inflammatory disease vs constipation vs DKA less likely.  I gave patient a trial dose of oxycodone with acetaminophen 7.5- 325 mg and she reported some improvement with pain.  -Negative pregnancy test -CBC positive for leukocytosis with WBC of 14. -CBG at the clinic today of 185 -UA showed trace leukocytosis with negative nitrites. -BMP showed normal bicarb with anion gap of 10. -Lipase unremarkable -Patient advised to continue Norco 5-325 mg q4 as needed for pain OR Norco 10 mg q4 as needed for pain. -She has been given strict return precautions.

## 2017-08-15 NOTE — Progress Notes (Signed)
Retinal images were done and transmitted today.

## 2017-08-15 NOTE — Patient Instructions (Signed)
[name],  It was a pleasure taking care of you at the clinic and I am sorry to you that you are having this ongoing abdominal pain.  My hope is that the pain medication I gave you here at the clinic will help with your pain.  Continue to monitor this pain, if it gets worse you can always follow-up at the return to the clinic  I am giving you and your sister a work note to make sure that you are feeling better before returning to work.  Dr. Eileen Stanford

## 2017-08-15 NOTE — Progress Notes (Signed)
   CC: [name] follow-up  HPI:  [name] is a 37 y.o. with past medical history as listed below presenting for [name] follow-up.  She presented to [name] department on 08/13/2017 with complaints of diffuse abdominal pain and constipation x3 days.  Prior to admission, she describes experiencing lightheadedness but no syncopal episodes.  In the ED, CT abdomen and pelvis revealed mild infiltration around the junction of the rectosigmoid colon that was consistent with early acute diverticulitis.  She was subsequently discharged with ciprofloxacin 500 mg, Flagyl 500 mg and Norco 5-3 25 mg x20.   Today she presented to the clinic with similar complaints of diffuse abdominal pain.  She describes the pain as a pressure like sensation located at her left lower quadrant that will sometimes radiate to her right and lower abdomen.  She rates pain at 10/10 and worsens with ambulation, rotation of body and touch.  She reports of experiencing fevers and chills yesterday evening which has since resolved.  She admits to constipation, some nausea but denies diarrhea, vomiting.  She has been able to tolerate p.o. intake as well.  Abdominal pain, unclear etiology: 5-day history of diffuse abdominal pain described as pressure-like sensation that worsens with ambulation, touch and rotation of body.  CT abdomen pelvis performed in the ED consistent with early.  She was given a course of ciprofloxacin, Flagyl and Norco at discharge from the ED.  She complains of some nausea though has been able to tolerate p.o. Intake.  I have her working differential diagnosis of diverticulitis vs pelvic inflammatory disease vs constipation vs DKA less likely.  I gave patient a trial dose of oxycodone with acetaminophen 7.5- 325 mg and she reported some improvement with pain. -Negative pregnancy test -CBC positive for leukocytosis with WBC of 14. -CBG at the clinic today of 185 -UA showed trace leukocytosis  with negative nitrites. -BMP showed normal bicarb with anion gap of 10. -Lipase unremarkable -Patient advised to continue Norco 5-325 mg q4 as needed for pain OR Norco 10 mg q4 as needed for pain. -She has been given strict return precautions.   Past Medical History:  Diagnosis Date  . Chest pain    multiple ED evaluations, most likely musculoskeletal (cervical radiculopathy), needs MRI spine  . Diabetes mellitus   . GERD (gastroesophageal reflux disease)   . Gestational diabetes   . PCOD (polycystic ovarian disease)    Followed by [name], has been on metformin   Review of Systems: As per HPI  Physical Exam:  Vitals:   08/15/17 1408  BP: (!) 150/85  Pulse: (!) 127  Temp: 98.2 F (36.8 C)  TempSrc: Oral  SpO2: 100%  Weight: 253 lb 6.4 oz (114.9 kg)  Height: 6' 1"  (1.854 m)   Constitutional: In moderate distress secondary to pain, talks in full sentences Cardiovascular: Normal rate and rhythm, no murmurs, gallops, rubs Respiratory: Clear to auscultation bilaterally.  No wheezes, crackles, rhonchi Abdomen: Bowel sounds present, tender to palpation on light touch at the left lower quadrant, right lower quadrant.,  Abdomen nondistended, no guarding or rigidity.  Assessment & Plan:   See Encounters Tab for problem based charting.  Patient discussed with [name].

## 2017-08-16 ENCOUNTER — Other Ambulatory Visit: Payer: Self-pay | Admitting: Internal Medicine

## 2017-08-16 NOTE — Progress Notes (Signed)
Internal Medicine Clinic Attending  I saw and evaluated the patient.  I personally confirmed the key portions of the history and exam documented by Dr. Eileen Stanford and I reviewed pertinent patient test results.  The assessment, diagnosis, and plan were formulated together and I agree with the documentation in the resident's note. She is young for diverticulitis but other dx R/O = neg pregnancy, No lab c/w DKA, no ovarian cyst / torsion. She is non-toxic appearing and safe for outpt tx. She was not getting relief with hydrocodone 5 mg so we need to increase her acute opioid dose - 10 mg oxycodone or could consider 7.5 oxycodone.

## 2017-08-17 ENCOUNTER — Inpatient Hospital Stay (HOSPITAL_COMMUNITY)
Admission: EM | Admit: 2017-08-17 | Discharge: 2017-08-19 | DRG: 871 | Discharge status: Against Medical Advice | Payer: Self-pay | Attending: Internal Medicine | Admitting: Internal Medicine

## 2017-08-17 ENCOUNTER — Encounter (HOSPITAL_COMMUNITY): Payer: Self-pay | Admitting: *Deleted

## 2017-08-17 ENCOUNTER — Emergency Department (HOSPITAL_COMMUNITY): Payer: Self-pay

## 2017-08-17 ENCOUNTER — Other Ambulatory Visit: Payer: Self-pay

## 2017-08-17 DIAGNOSIS — Z833 Family history of diabetes mellitus: Secondary | ICD-10-CM

## 2017-08-17 DIAGNOSIS — A419 Sepsis, unspecified organism: Principal | ICD-10-CM

## 2017-08-17 DIAGNOSIS — R1084 Generalized abdominal pain: Secondary | ICD-10-CM

## 2017-08-17 DIAGNOSIS — K5090 Crohn's disease, unspecified, without complications: Secondary | ICD-10-CM

## 2017-08-17 DIAGNOSIS — E1140 Type 2 diabetes mellitus with diabetic neuropathy, unspecified: Secondary | ICD-10-CM | POA: Diagnosis present

## 2017-08-17 DIAGNOSIS — Z6836 Body mass index (BMI) 36.0-36.9, adult: Secondary | ICD-10-CM

## 2017-08-17 DIAGNOSIS — K5732 Diverticulitis of large intestine without perforation or abscess without bleeding: Secondary | ICD-10-CM | POA: Diagnosis present

## 2017-08-17 DIAGNOSIS — R109 Unspecified abdominal pain: Secondary | ICD-10-CM

## 2017-08-17 DIAGNOSIS — E876 Hypokalemia: Secondary | ICD-10-CM | POA: Diagnosis present

## 2017-08-17 DIAGNOSIS — Z720 Tobacco use: Secondary | ICD-10-CM | POA: Diagnosis present

## 2017-08-17 DIAGNOSIS — IMO0002 Reserved for concepts with insufficient information to code with codable children: Secondary | ICD-10-CM | POA: Diagnosis present

## 2017-08-17 DIAGNOSIS — F17210 Nicotine dependence, cigarettes, uncomplicated: Secondary | ICD-10-CM | POA: Diagnosis present

## 2017-08-17 DIAGNOSIS — E860 Dehydration: Secondary | ICD-10-CM | POA: Diagnosis present

## 2017-08-17 DIAGNOSIS — E1165 Type 2 diabetes mellitus with hyperglycemia: Secondary | ICD-10-CM | POA: Diagnosis present

## 2017-08-17 DIAGNOSIS — K219 Gastro-esophageal reflux disease without esophagitis: Secondary | ICD-10-CM | POA: Diagnosis present

## 2017-08-17 DIAGNOSIS — K659 Peritonitis, unspecified: Secondary | ICD-10-CM

## 2017-08-17 HISTORY — DX: Crohn's disease, unspecified, without complications: K50.90

## 2017-08-17 LAB — COMPREHENSIVE METABOLIC PANEL
ALT: 10 U/L (ref 0–44)
ANION GAP: 11 (ref 5–15)
AST: 10 U/L — ABNORMAL LOW (ref 15–41)
Albumin: 3.3 g/dL — ABNORMAL LOW (ref 3.5–5.0)
Alkaline Phosphatase: 55 U/L (ref 38–126)
BUN: 6 mg/dL (ref 6–20)
CHLORIDE: 105 mmol/L (ref 98–111)
CO2: 23 mmol/L (ref 22–32)
Calcium: 8.5 mg/dL — ABNORMAL LOW (ref 8.9–10.3)
Creatinine, Ser: 0.63 mg/dL (ref 0.44–1.00)
GFR calc non Af Amer: >60 mL/min (ref >60)
Glucose, Bld: 270 mg/dL — ABNORMAL HIGH (ref 70–99)
POTASSIUM: 3.2 mmol/L — AB (ref 3.5–5.1)
SODIUM: 139 mmol/L (ref 135–145)
Total Bilirubin: 1 mg/dL (ref 0.3–1.2)
Total Protein: 7.2 g/dL (ref 6.5–8.1)

## 2017-08-17 LAB — CBC WITH DIFFERENTIAL/PLATELET
Basophils Absolute: 0 10*3/uL (ref 0.0–0.1)
Basophils Relative: 0 %
EOS ABS: 0 10*3/uL (ref 0.0–0.7)
Eosinophils Relative: 0 %
HEMATOCRIT: 39.6 % (ref 36.0–46.0)
HEMOGLOBIN: 13 g/dL (ref 12.0–15.0)
LYMPHS ABS: 1.5 10*3/uL (ref 0.7–4.0)
Lymphocytes Relative: 9 %
MCH: 27.5 pg (ref 26.0–34.0)
MCHC: 32.8 g/dL (ref 30.0–36.0)
MCV: 83.9 fL (ref 78.0–100.0)
Monocytes Absolute: 1 10*3/uL (ref 0.1–1.0)
Monocytes Relative: 6 %
NEUTROS ABS: 14.1 10*3/uL — AB (ref 1.7–7.7)
NEUTROS PCT: 85 %
Platelets: 319 10*3/uL (ref 150–400)
RBC: 4.72 MIL/uL (ref 3.87–5.11)
RDW: 13.8 % (ref 11.5–15.5)
WBC: 16.7 10*3/uL — AB (ref 4.0–10.5)

## 2017-08-17 LAB — URINALYSIS, ROUTINE W REFLEX MICROSCOPIC
Bilirubin Urine: NEGATIVE
GLUCOSE, UA: 150 mg/dL — AB
KETONES UR: NEGATIVE mg/dL
NITRITE: NEGATIVE
PROTEIN: NEGATIVE mg/dL
Specific Gravity, Urine: >1.046 — ABNORMAL HIGH (ref 1.005–1.030)
pH: 6 (ref 5.0–8.0)

## 2017-08-17 LAB — I-STAT CHEM 8, ED
BUN: 4 mg/dL — AB (ref 6–20)
CALCIUM ION: 1.07 mmol/L — AB (ref 1.15–1.40)
CREATININE: 0.5 mg/dL (ref 0.44–1.00)
Chloride: 104 mmol/L (ref 98–111)
GLUCOSE: 271 mg/dL — AB (ref 70–99)
HCT: 41 % (ref 36.0–46.0)
Hemoglobin: 13.9 g/dL (ref 12.0–15.0)
Potassium: 3.2 mmol/L — ABNORMAL LOW (ref 3.5–5.1)
Sodium: 140 mmol/L (ref 135–145)
TCO2: 23 mmol/L (ref 22–32)

## 2017-08-17 LAB — LIPASE, BLOOD: Lipase: 22 U/L (ref 11–51)

## 2017-08-17 LAB — I-STAT BETA HCG BLOOD, ED (MC, WL, AP ONLY)

## 2017-08-17 LAB — GLUCOSE, CAPILLARY: GLUCOSE-CAPILLARY: 208 mg/dL — AB (ref 70–99)

## 2017-08-17 LAB — I-STAT CG4 LACTIC ACID, ED: Lactic Acid, Venous: 1.23 mmol/L (ref 0.5–1.9)

## 2017-08-17 MED ORDER — FENTANYL CITRATE (PF) 100 MCG/2ML IJ SOLN
50.0000 ug | Freq: Once | INTRAMUSCULAR | Status: AC
  Administered 2017-08-17: 50 ug via INTRAVENOUS
  Filled 2017-08-17: qty 2

## 2017-08-17 MED ORDER — INSULIN ASPART 100 UNIT/ML ~~LOC~~ SOLN
0.0000 [IU] | Freq: Every day | SUBCUTANEOUS | Status: DC
  Administered 2017-08-17: 2 [IU] via SUBCUTANEOUS

## 2017-08-17 MED ORDER — IOPAMIDOL (ISOVUE-300) INJECTION 61%
INTRAVENOUS | Status: AC
  Administered 2017-08-17: 100 mL
  Filled 2017-08-17: qty 100

## 2017-08-17 MED ORDER — SODIUM CHLORIDE 0.9 % IV SOLN: INTRAVENOUS | Status: DC

## 2017-08-17 MED ORDER — SODIUM CHLORIDE 0.9 % IV SOLN
INTRAVENOUS | Status: DC
  Administered 2017-08-17: 17:00:00 via INTRAVENOUS

## 2017-08-17 MED ORDER — SODIUM CHLORIDE 0.9 % IV BOLUS
1000.0000 mL | Freq: Once | INTRAVENOUS | Status: AC
  Administered 2017-08-17: 1000 mL via INTRAVENOUS

## 2017-08-17 MED ORDER — PIPERACILLIN-TAZOBACTAM 3.375 G IVPB
3.3750 g | Freq: Three times a day (TID) | INTRAVENOUS | Status: DC
  Administered 2017-08-18 – 2017-08-19 (×6): 3.375 g via INTRAVENOUS
  Filled 2017-08-17 (×6): qty 50

## 2017-08-17 MED ORDER — HYDROMORPHONE HCL 1 MG/ML IJ SOLN
1.0000 mg | INTRAMUSCULAR | Status: DC | PRN
Start: 2017-08-17 — End: 2017-08-19
  Administered 2017-08-17 – 2017-08-19 (×5): 1 mg via INTRAVENOUS
  Filled 2017-08-17 (×5): qty 1

## 2017-08-17 MED ORDER — SODIUM CHLORIDE 0.9 % IV BOLUS: 1000.0000 mL | Freq: Once | INTRAVENOUS | Status: DC

## 2017-08-17 MED ORDER — ONDANSETRON HCL 4 MG/2ML IJ SOLN
4.0000 mg | Freq: Once | INTRAMUSCULAR | Status: AC
  Administered 2017-08-17: 4 mg via INTRAVENOUS
  Filled 2017-08-17: qty 2

## 2017-08-17 MED ORDER — PIPERACILLIN-TAZOBACTAM 4.5 G IVPB: 4.5000 g | Freq: Once | INTRAVENOUS | Status: DC

## 2017-08-17 MED ORDER — POTASSIUM CHLORIDE IN NACL 20-0.9 MEQ/L-% IV SOLN
INTRAVENOUS | Status: DC
  Administered 2017-08-17 – 2017-08-18 (×2): via INTRAVENOUS
  Filled 2017-08-17 (×2): qty 1000

## 2017-08-17 MED ORDER — HYDROMORPHONE HCL 1 MG/ML IJ SOLN
1.0000 mg | Freq: Once | INTRAMUSCULAR | Status: AC
Start: 2017-08-17 — End: 2017-08-17
  Administered 2017-08-17: 1 mg via INTRAVENOUS
  Filled 2017-08-17: qty 1

## 2017-08-17 MED ORDER — PIPERACILLIN-TAZOBACTAM 3.375 G IVPB 30 MIN
3.3750 g | Freq: Once | INTRAVENOUS | Status: AC
  Administered 2017-08-17: 3.375 g via INTRAVENOUS
  Filled 2017-08-17: qty 50

## 2017-08-17 MED ORDER — INSULIN ASPART 100 UNIT/ML ~~LOC~~ SOLN
0.0000 [IU] | Freq: Three times a day (TID) | SUBCUTANEOUS | Status: DC
  Administered 2017-08-18 – 2017-08-19 (×4): 3 [IU] via SUBCUTANEOUS

## 2017-08-17 NOTE — ED Notes (Signed)
[place] RN ICU SD

## 2017-08-17 NOTE — H&P (Addendum)
History and Physical    [name]:[id number] DOB: 01/05/1980 DOA: 08/17/2017  PCP: Alphonzo Grieve, MD  Patient coming from: home   Chief Complaint: abdominal pain  HPI: [name] is a 37 y.o. female with medical history significant for morbid obesity, t2d, pcos, who presents with the above.  Symptoms began 8/12. New problem. Began insidiously but progressed relatively rapidly. Developed epigastric abdominal pain and distention, constant but at times worse, at times better. Mainly aggravated by movement. Not associated with food, though appetite is poor. No vomiting or diarrhea, no blood or mucous in stool. Hx c/s, no other abdominal surgeries. Developed fevers, chills, and sweats. Presented to ed on 8/12, ct showed diverticulitis, discharged home on cipro/flagyl. Symptoms have slowly worsened.   ED Course: zosyn, 1 L NS, CT abdomen  Review of Systems: As per HPI otherwise 10 point review of systems negative.    Past Medical History:  Diagnosis Date  . Chest pain    multiple ED evaluations, most likely musculoskeletal (cervical radiculopathy), needs MRI spine  . Diabetes mellitus   . GERD (gastroesophageal reflux disease)   . Gestational diabetes   . PCOD (polycystic ovarian disease)    Follow[facility] by [facility], has been on metformin    Past Surgical History:  Procedure Laterality Date  . CESAREAN SECTION  12/14/2011   Procedure: CESAREAN SECTION;  Surgeon: Lahoma Crocker, MD;  Location: [facility];  Service: Obstetrics;  Laterality: N/A;  Primary Cesarean Section Delivery Girl @ [phone], [place]     reports that she has been smoking cigarettes. She has been smoking about 0.50 packs per day. She has never used smokeless tobacco. She reports that she does not drink alcohol or use drugs.  No Known Allergies  Family History  Problem Relation Age of Onset  . Diabetes Mother   . Hypertension Mother   . Stomach cancer Mother        diet at 25  . Heart  disease Father        diet at 26  . Diabetes Sister   . Hypertension Sister   . Diabetes Brother   . Cancer Maternal Grandmother   . Cancer Paternal Grandmother     Prior to Admission medications   Medication Sig Start Date End Date Taking? Authorizing Provider  acetaminophen (TYLENOL) 500 MG tablet Take 1,500 mg by mouth every 6 (six) hours as needed (pain.).   Yes [provider]  ciprofloxacin (CIPRO) 500 MG tablet Take 1 tablet (500 mg total) by mouth 2 (two) times daily. One po bid x 10 days 08/13/17  Yes Daleen Bo, MD  cyclobenzaprine (FLEXERIL) 5 MG tablet Take 5 mg by mouth 3 (three) times daily.   Yes [provider]  HYDROcodone-acetaminophen (NORCO) 5-325 MG tablet Take 1 tablet by mouth every 4 (four) hours as needed. 08/13/17  Yes Daleen Bo, MD  ibuprofen (ADVIL,MOTRIN) 200 MG tablet Take 400 mg by mouth every 6 (six) hours as needed (pain.).   Yes [provider]  metroNIDAZOLE (FLAGYL) 500 MG tablet Take 1 tablet (500 mg total) by mouth 2 (two) times daily. One po bid x 10 days 08/13/17  Yes Daleen Bo, MD    Physical Exam: Vitals:   08/17/17 1724 08/17/17 1728 08/17/17 1744 08/17/17 1900  BP:    (!) 119/35  Pulse: (!) 116 (!) 118 (!) 126 (!) 114  Resp: (!) 32 (!) 32 (!) 21 16  Temp:      TempSrc:  SpO2: 92% 96% 99% 93%    Constitutional: mild distress Head: Atraumatic Eyes: Conjunctiva clear ENM: dry mucous membranes. Poor dentition.  Neck: Supple Respiratory: Clear to auscultation bilaterally, no wheezing/rales/rhonchi. Normal respiratory effort. No accessory muscle use. . Cardiovascular: tachycardic, normal rhythm. No murmurs/rubs/gallops. Abdomen: obese, distended, ttp throughout, positive for rebound and guarding. Does not tolerate much of an exam Musculoskeletal: No joint deformity upper and lower extremities. Normal ROM, no contractures. Normal muscle tone.  Skin: No rashes, lesions, or ulcers.  Extremities: No  peripheral edema. Palpable peripheral pulses. Neurologic: Alert, moving all 4 extremities. Psychiatric: Normal insight and judgement.   Labs on Admission: I have personally reviewed following labs and imaging studies  CBC: Recent Labs  Lab 08/13/17 2009 08/17/17 1644 08/17/17 1705  WBC 14.0* 16.7*  --   NEUTROABS  --  14.1*  --   HGB 12.9 13.0 13.9  HCT 40.2 39.6 41.0  MCV 84.8 83.9  --   PLT 260 319  --    Basic Metabolic Panel: Recent Labs  Lab 08/13/17 2009 08/17/17 1644 08/17/17 1705  NA 142 139 140  K 3.5 3.2* 3.2*  CL 108 105 104  CO2 24 23  --   GLUCOSE 209* 270* 271*  BUN 6 6 4*  CREATININE 0.61 0.63 0.50  CALCIUM 8.9 8.5*  --    GFR: Estimated Creatinine Clearance: 137.3 mL/min (by C-G formula based on SCr of 0.5 mg/dL). Liver Function Tests: Recent Labs  Lab 08/13/17 2009 08/17/17 1644  AST 16 10*  ALT 18 10  ALKPHOS 63 55  BILITOT 0.6 1.0  PROT 7.5 7.2  ALBUMIN 3.8 3.3*   Recent Labs  Lab 08/13/17 2009 08/17/17 1644  LIPASE 28 22   No results for input(s): AMMONIA in the last 168 hours. Coagulation Profile: No results for input(s): INR, PROTIME in the last 168 hours. Cardiac Enzymes: No results for input(s): CKTOTAL, CKMB, CKMBINDEX, TROPONINI in the last 168 hours. BNP (last 3 results) No results for input(s): PROBNP in the last 8760 hours. HbA1C: No results for input(s): HGBA1C in the last 72 hours. CBG: Recent Labs  Lab 08/15/17 1421  GLUCAP 185*   Lipid Profile: No results for input(s): CHOL, HDL, LDLCALC, TRIG, CHOLHDL, LDLDIRECT in the last 72 hours. Thyroid Function Tests: No results for input(s): TSH, T4TOTAL, FREET4, T3FREE, THYROIDAB in the last 72 hours. Anemia Panel: No results for input(s): VITAMINB12, FOLATE, FERRITIN, TIBC, IRON, RETICCTPCT in the last 72 hours. Urine analysis:    Component Value Date/Time   COLORURINE YELLOW 08/17/2017 [place] 08/17/2017 1644   LABSPEC >1.046 (H) 08/17/2017  1644   PHURINE 6.0 08/17/2017 1644   GLUCOSEU 150 (A) 08/17/2017 1644   GLUCOSEU NEG mg/dL 07/13/2006 2351   HGBUR MODERATE (A) 08/17/2017 1644   BILIRUBINUR NEGATIVE 08/17/2017 1644   BILIRUBINUR Negative 08/12/2014 1551   KETONESUR NEGATIVE 08/17/2017 1644   PROTEINUR NEGATIVE 08/17/2017 1644   UROBILINOGEN 1.0 08/12/2014 1551   UROBILINOGEN 1.0 04/22/2013 1148   NITRITE NEGATIVE 08/17/2017 1644   LEUKOCYTESUR LARGE (A) 08/17/2017 1644    Radiological Exams on Admission: Ct Abdomen Pelvis W Contrast  Addendum Date: 08/17/2017   ADDENDUM REPORT: 08/17/2017 19:18 ADDENDUM: These results were discussed by telephone on 08/17/2017 at 7:17 pm to Dr. Lennice Sites , who verbally acknowledged these results. Surgical consultation was recommended. Electronically Signed   By: Richardean Sale M.D.   On: 08/17/2017 19:18   Result Date: 08/17/2017 CLINICAL DATA:  Seven day  history of diffuse abdominal pain with some nausea. Concern for perforation. EXAM: CT ABDOMEN AND PELVIS WITH CONTRAST TECHNIQUE: Multidetector CT imaging of the abdomen and pelvis was performed using the standard protocol following bolus administration of intravenous contrast. CONTRAST:  150m ISOVUE-300 IOPAMIDOL (ISOVUE-300) INJECTION 61% COMPARISON:  CT 08/13/2017 FINDINGS: Lower chest: Mild mosaic attenuation in both lung bases, likely atelectasis. No focal airspace disease, pleural or pericardial effusion. Hepatobiliary: Subjective hepatic steatosis without focal abnormality. Probable sludge in the gallbladder lumen. No calcified gallstone, wall thickening or biliary dilatation. Pancreas: Unremarkable. No pancreatic ductal dilatation or surrounding inflammatory changes. Spleen: Normal in size without focal abnormality. Adrenals/Urinary Tract: Both adrenal glands appear normal. The kidneys appear normal without evidence of urinary tract calculus, suspicious lesion or hydronephrosis. No bladder abnormalities are seen. Stomach/Bowel: The  stomach and proximal small bowel appear normal. There is questionable mild small bowel wall thickening in the left mid abdomen (images 55-61/2). The appendix and colon appear stable with mild sigmoid diverticulosis, but no focal associated inflammation. There is no significant bowel distension. There is diffuse edema throughout the mesenteric fat with a small amount of interloop fluid (notably on images 54, 69 and 72 of series 2). There is also moderate amount of free pelvic fluid. Vascular/Lymphatic: There are no enlarged abdominal or pelvic lymph nodes. Mildly prominent external iliac and inguinal lymph nodes bilaterally are similar to the previous study. The contrast bolus is suboptimal, but no acute vascular findings are demonstrated. There is mild aortoiliac atherosclerosis. Reproductive: The uterus and ovaries appear normal. No adnexal mass. Other: As above, there is diffuse edema throughout the mesenteric fat with a small amount of interloop fluid. There is no generalized ascites, drainable fluid collection or free intraperitoneal air. No abdominal wall hernia. Musculoskeletal: No acute or significant osseous findings. IMPRESSION: 1. New acute diffuse mesenteric inflammation with interloop and free pelvic fluid. There is a questionable loop of mid small bowel with associated wall thickening. Appearance could be secondary to vasculitis, peritonitis or acute enteritis. 2. The appendix and colon demonstrate no acute findings. There is mild sigmoid diverticulosis. 3. No drainable fluid collection, bowel obstruction or free air. 4. Mild aortic and branch vessel atherosclerosis without evidence of acute vascular occlusion. Electronically Signed: By: WRichardean SaleM.D. On: 08/17/2017 18:10    EKG: Independently reviewed. Sinus tach  Assessment/Plan Active Problems:   Diabetes type 2, uncontrolled ([facility]   Tobacco use   [place]itis ([place]   Morbid obesity ([facility]   Sepsis ([place]   # peritonitis # sepsis -  ct 8/12 showed diverticulitis, not seen today (ED physician confirmed w/ radiologist). Today's ct is non-specific, showing diffuse mesentiric inflammation, possible involvement of mid small bowel. Radiology ddx includes vasculitis, peritonitis, acute enteritis. On exam significant distention, rebound, and guarding. gen surg ([facility]es consulted by ED physician, will see. Initial plan broad-spectrum IV abx. Tachycardic, elevated white count, mild fever. LA and bp wnl. - continue zosyn - another liter NS - NS @ 150/hr after that - npo pending gen surg consult - step-down - dilaudid for pain  # T2DM - diet controlled per patient, last a1c 7.2 02/2017. herre glucose elevated to 270 - ssi  # hypokalemia - mild, 3.2 - repeat in am, with Mg - maintenance fluids w/ 20 meq kcl  # nicotine use - declines patch   DVT prophylaxis: scds for now, holding pharmacologic pending surgical eval Code Status: full  Family Communication: brother [name] [phone] Disposition Plan: tbd  Consults called: gen surg  Admission status: step-down    Desma Maxim MD [facility] Pager [phone]  If 7PM-7AM, please contact night-coverage [email or web address] Password [facility]  08/17/2017, 8:04 PM

## 2017-08-17 NOTE — ED Notes (Signed)
ED TO INPATIENT HANDOFF REPORT  Name/Age/Gender Misty [name] 37 y.o. female  Code Status Code Status History    Date Active Date Inactive Code Status Order ID Comments User Context   12/14/2011 0907 12/16/2011 1424 Full Code [id number]  Rosalin Hawking, RN Inpatient      Home/SNF/Other Home  Chief Complaint abd pain   Level of Care/Admitting Diagnosis ED Disposition    ED Disposition Condition [facility] Area: [facility] [[id number]]  Level of Care: Stepdown [14]  Admit to SDU based on following criteria: Severe physiological/psychological symptoms:  Any diagnosis requiring assessment & intervention at least every 4 hours on an ongoing basis to obtain desired patient outcomes including stability and rehabilitation  Diagnosis: Peritonitis [facility]) [[id number]]  Admitting Physician: Eston Esters  Attending Physician: Eston Esters  Estimated length of stay: past midnight tomorrow  Certification:: I certify this patient will need inpatient services for at least 2 midnights  PT Class (Do Not Modify): Inpatient [101]  PT Acc Code (Do Not Modify): Private [1]       Medical History Past Medical History:  Diagnosis Date  . Chest pain    multiple ED evaluations, most likely musculoskeletal (cervical radiculopathy), needs MRI spine  . Diabetes mellitus   . GERD (gastroesophageal reflux disease)   . Gestational diabetes   . PCOD (polycystic ovarian disease)    Followed by [facility], has been on metformin    Allergies No Known Allergies  IV Location/Drains/Wounds Patient Lines/Drains/Airways Status   Active Line/Drains/Airways    Name:   Placement date:   Placement time:   Site:   Days:   Peripheral IV 08/17/17 Left;Medial Antecubital   08/17/17    1639    Antecubital   less than 1   Peripheral IV 08/17/17 Right;Posterior Hand   08/17/17    1706    Hand   less than 1   Incision 12/14/11 Abdomen Other (Comment)    12/14/11    0707     2073          Labs/Imaging Results for orders placed or performed during the hospital encounter of 08/17/17 (from the past 48 hour(s))  Comprehensive metabolic panel     Status: Abnormal   Collection Time: 08/17/17  4:44 PM  Result Value Ref Range   Sodium 139 135 - 145 mmol/L   Potassium 3.2 (L) 3.5 - 5.1 mmol/L   Chloride 105 98 - 111 mmol/L   CO2 23 22 - 32 mmol/L   Glucose, Bld 270 (H) 70 - 99 mg/dL   BUN 6 6 - 20 mg/dL   Creatinine, Ser 0.63 0.44 - 1.00 mg/dL   Calcium 8.5 (L) 8.9 - 10.3 mg/dL   Total Protein 7.2 6.5 - 8.1 g/dL   Albumin 3.3 (L) 3.5 - 5.0 g/dL   AST 10 (L) 15 - 41 U/L   ALT 10 0 - 44 U/L   Alkaline Phosphatase 55 38 - 126 U/L   Total Bilirubin 1.0 0.3 - 1.2 mg/dL   GFR calc non Af Amer >60 >60 mL/min   GFR calc Af Amer >60 >60 mL/min    Comment: (NOTE) The eGFR has been calculated using the CKD EPI equation. This calculation has not been validated in all clinical situations. eGFR's persistently <60 mL/min signify possible Chronic Kidney Disease.    Anion gap 11 5 - 15    Comment: Performed at [facility], [place] [address] [place], [place] [address]  Lipase, blood     Status: None   Collection Time: 08/17/17  4:44 PM  Result Value Ref Range   Lipase 22 11 - 51 U/L    Comment: Performed at [facility], [place] [address] [place], [place] [address]  CBC with Diff     Status: Abnormal   Collection Time: 08/17/17  4:44 PM  Result Value Ref Range   WBC 16.7 (H) 4.0 - 10.5 K/uL   RBC 4.72 3.87 - 5.11 MIL/uL   Hemoglobin 13.0 12.0 - 15.0 g/dL   HCT 39.6 36.0 - 46.0 %   MCV 83.9 78.0 - 100.0 fL   MCH 27.5 26.0 - 34.0 pg   MCHC 32.8 30.0 - 36.0 g/dL   RDW 13.8 11.5 - 15.5 %   Platelets 319 150 - 400 K/uL   Neutrophils Relative % 85 %   Neutro Abs 14.1 (H) 1.7 - 7.7 K/uL   Lymphocytes Relative 9 %   Lymphs Abs 1.5 0.7 - 4.0 K/uL   Monocytes Relative 6 %   Monocytes Absolute 1.0 0.1 - 1.0  K/uL   Eosinophils Relative 0 %   Eosinophils Absolute 0.0 0.0 - 0.7 K/uL   Basophils Relative 0 %   Basophils Absolute 0.0 0.0 - 0.1 K/uL    Comment: Performed at [facility], [place] [address], [place], [place] [address]  Urinalysis, Routine w reflex microscopic     Status: Abnormal   Collection Time: 08/17/17  4:44 PM  Result Value Ref Range   Color, Urine YELLOW YELLOW   APPearance CLEAR CLEAR   Specific Gravity, Urine >1.046 (H) 1.005 - 1.030   pH 6.0 5.0 - 8.0   Glucose, UA 150 (A) NEGATIVE mg/dL   Hgb urine dipstick MODERATE (A) NEGATIVE   Bilirubin Urine NEGATIVE NEGATIVE   Ketones, ur NEGATIVE NEGATIVE mg/dL   Protein, ur NEGATIVE NEGATIVE mg/dL   Nitrite NEGATIVE NEGATIVE   Leukocytes, UA LARGE (A) NEGATIVE   RBC / HPF 6-10 0 - 5 RBC/hpf   WBC, UA >50 (H) 0 - 5 WBC/hpf   Bacteria, UA RARE (A) NONE SEEN   Squamous Epithelial / LPF 0-5 0 - 5   Mucus PRESENT     Comment: Performed at [facility], [place] [address] [place], [place] [address]  I-Stat CG4 Lactic Acid, ED     Status: None   Collection Time: 08/17/17  4:59 PM  Result Value Ref Range   Lactic Acid, Venous 1.23 0.5 - 1.9 mmol/L  I-Stat Chem 8, ED     Status: Abnormal   Collection Time: 08/17/17  5:05 PM  Result Value Ref Range   Sodium 140 135 - 145 mmol/L   Potassium 3.2 (L) 3.5 - 5.1 mmol/L   Chloride 104 98 - 111 mmol/L   BUN 4 (L) 6 - 20 mg/dL   Creatinine, Ser 0.50 0.44 - 1.00 mg/dL   Glucose, Bld 271 (H) 70 - 99 mg/dL   Calcium, Ion 1.07 (L) 1.15 - 1.40 mmol/L   TCO2 23 22 - 32 mmol/L   Hemoglobin 13.9 12.0 - 15.0 g/dL   HCT 41.0 36.0 - 46.0 %  I-Stat beta hCG blood, ED     Status: None   Collection Time: 08/17/17  5:07 PM  Result Value Ref Range   I-stat hCG, quantitative <5.0 <5 mIU/mL   Comment 3            Comment:  GEST. AGE      CONC.  (mIU/mL)   <=1 WEEK        5 - 50     2 WEEKS       50 - 500     3 WEEKS       100 - 10,000     4 WEEKS     1,000 -  30,000        FEMALE AND NON-PREGNANT FEMALE:     LESS THAN 5 mIU/mL    Ct Abdomen Pelvis W Contrast  Addendum Date: 08/17/2017   ADDENDUM REPORT: 08/17/2017 19:18 ADDENDUM: These results were discussed by telephone on 08/17/2017 at 7:17 pm to Dr. Lennice Sites , who verbally acknowledged these results. Surgical consultation was recommended. Electronically Signed   By: Richardean Sale M.D.   On: 08/17/2017 19:18   Result Date: 08/17/2017 CLINICAL DATA:  Seven day history of diffuse abdominal pain with some nausea. Concern for perforation. EXAM: CT ABDOMEN AND PELVIS WITH CONTRAST TECHNIQUE: Multidetector CT imaging of the abdomen and pelvis was performed using the standard protocol following bolus administration of intravenous contrast. CONTRAST:  133m ISOVUE-300 IOPAMIDOL (ISOVUE-300) INJECTION 61% COMPARISON:  CT 08/13/2017 FINDINGS: Lower chest: Mild mosaic attenuation in both lung bases, likely atelectasis. No focal airspace disease, pleural or pericardial effusion. Hepatobiliary: Subjective hepatic steatosis without focal abnormality. Probable sludge in the gallbladder lumen. No calcified gallstone, wall thickening or biliary dilatation. Pancreas: Unremarkable. No pancreatic ductal dilatation or surrounding inflammatory changes. Spleen: Normal in size without focal abnormality. Adrenals/Urinary Tract: Both adrenal glands appear normal. The kidneys appear normal without evidence of urinary tract calculus, suspicious lesion or hydronephrosis. No bladder abnormalities are seen. Stomach/Bowel: The stomach and proximal small bowel appear normal. There is questionable mild small bowel wall thickening in the left mid abdomen (images 55-61/2). The appendix and colon appear stable with mild sigmoid diverticulosis, but no focal associated inflammation. There is no significant bowel distension. There is diffuse edema throughout the mesenteric fat with a small amount of interloop fluid (notably on images 54, 69 and  72 of series 2). There is also moderate amount of free pelvic fluid. Vascular/Lymphatic: There are no enlarged abdominal or pelvic lymph nodes. Mildly prominent external iliac and inguinal lymph nodes bilaterally are similar to the previous study. The contrast bolus is suboptimal, but no acute vascular findings are demonstrated. There is mild aortoiliac atherosclerosis. Reproductive: The uterus and ovaries appear normal. No adnexal mass. Other: As above, there is diffuse edema throughout the mesenteric fat with a small amount of interloop fluid. There is no generalized ascites, drainable fluid collection or free intraperitoneal air. No abdominal wall hernia. Musculoskeletal: No acute or significant osseous findings. IMPRESSION: 1. New acute diffuse mesenteric inflammation with interloop and free pelvic fluid. There is a questionable loop of mid small bowel with associated wall thickening. Appearance could be secondary to vasculitis, peritonitis or acute enteritis. 2. The appendix and colon demonstrate no acute findings. There is mild sigmoid diverticulosis. 3. No drainable fluid collection, bowel obstruction or free air. 4. Mild aortic and branch vessel atherosclerosis without evidence of acute vascular occlusion. Electronically Signed: By: WRichardean SaleM.D. On: 08/17/2017 18:10    Pending Labs Unresulted Labs (From admission, onward)    Start     Ordered   08/17/17 1624  Blood culture (routine x 2)  BLOOD CULTURE X 2,   STAT     08/17/17 1624   Signed and Held  HIV antibody (Routine Testing)  Once,   R     Signed and Held   Signed and Held  CBC  (heparin)  Once,   R    Comments:  Baseline for heparin therapy IF NOT ALREADY DRAWN.  Notify MD if PLT < 100 K.    Signed and Held   Signed and Held  Creatinine, serum  (heparin)  Once,   R    Comments:  Baseline for heparin therapy IF NOT ALREADY DRAWN.    Signed and Held   Signed and Held  Comprehensive metabolic panel  Tomorrow morning,   R      Signed and Held   Signed and Held  CBC  Tomorrow morning,   R     Signed and Held   Signed and Held  Magnesium  Tomorrow morning,   R     Signed and Held          Vitals/Pain Today's Vitals   08/17/17 2045 08/17/17 2100 08/17/17 2115 08/17/17 2130  BP:  113/67  112/78  Pulse: (!) 124 (!) 115 (!) 117 (!) 119  Resp: (!) 28 (!) 23 (!) 28 19  Temp:      TempSrc:      SpO2: 91% 92% 94% 96%  PainSc:        Isolation Precautions No active isolations  Medications Medications  sodium chloride 0.9 % bolus 1,000 mL (0 mLs Intravenous Stopped 08/17/17 1731)    And  0.9 %  sodium chloride infusion ( Intravenous Transfusing/Transfer 08/17/17 2146)  sodium chloride 0.9 % bolus 1,000 mL (has no administration in time range)  0.9 %  sodium chloride infusion (has no administration in time range)  piperacillin-tazobactam (ZOSYN) IVPB 3.375 g (has no administration in time range)  fentaNYL (SUBLIMAZE) injection 50 mcg (50 mcg Intravenous Given 08/17/17 1655)  ondansetron (ZOFRAN) injection 4 mg (4 mg Intravenous Given 08/17/17 1655)  piperacillin-tazobactam (ZOSYN) IVPB 3.375 g (0 g Intravenous Stopped 08/17/17 1725)  iopamidol (ISOVUE-300) 61 % injection (100 mLs  Contrast Given 08/17/17 1736)  HYDROmorphone (DILAUDID) injection 1 mg (1 mg Intravenous Given 08/17/17 1908)    Mobility walks with person assist

## 2017-08-17 NOTE — ED Triage Notes (Signed)
Pt presents in moderate pain induced distress with c/o severe diffuse abdominal pain reports onset about a week ago when she was diagnosed with acute diverticulosis, started on antibiotics (chart review collaborates these) but she notes her pain and symptoms have only worsened.  Quick initial assessment reveals a grossly distended abdomen, guarding and severe tenderness. Pt denies N/V but notes diarrhea for past 2 or so days including today. Given these symptoms and presentation, I have invited Dr. Ronnald Nian (attendeing EDP) at bedside who immediately calls a code sepsis (please see orders) including pain management orders.

## 2017-08-17 NOTE — Consult Note (Signed)
Reason for Consult:Abdominal pain Referring Physician: Ronnald Nian, DO  Misty [name] is an 37 y.o. female.  HPI:  Pt is a 37 yo F who presents to ED with worsening abdominal pain.  She is referred for consultation by Dr. Ronnald Nian.  She was in the ED earlier this week with lower abdominal pain and pressure.  She was diagnosed with diverticulitis and placed on oral antibiotics.  She drank broth only for 2 days, then saw her regular doctor. At that point, he advanced her diet.  She continued to feel worse with the pain moving to her central abdomen. She has been passing gas and has had 2 BMs today without relief.  She has had fever and shaking chills and sweats.  She describes the pain as severe and it hurting when she moves.  She denies sick contacts.  She has a 12 year old, but the child stays home and is not in day care.  She has had nausea but not emesis.  She has not had any diarrhea.  She denies bloody stools.   She has not had any unusual travel/camping.   Past Medical History:  Diagnosis Date  . Chest pain    multiple ED evaluations, most likely musculoskeletal (cervical radiculopathy), needs MRI spine  . Diabetes mellitus   . GERD (gastroesophageal reflux disease)   . Gestational diabetes   . PCOD (polycystic ovarian disease)    Follow[facility] by [facility], has been on metformin    Past Surgical History:  Procedure Laterality Date  . CESAREAN SECTION  12/14/2011   Procedure: CESAREAN SECTION;  Surgeon: Lahoma Crocker, MD;  Location: [facility];  Service: Obstetrics;  Laterality: N/A;  Primary Cesarean Section Delivery Girl @ [phone], Apgars    Family History  Problem Relation Age of Onset  . Diabetes Mother   . Hypertension Mother   . Stomach cancer Mother        diet at 43  . Heart disease Father        diet at 15  . Diabetes Sister   . Hypertension Sister   . Diabetes Brother   . Cancer Maternal Grandmother   . Cancer Paternal Grandmother     Social History:  reports that she  has been smoking cigarettes. She has been smoking about 0.50 packs per day. She has never used smokeless tobacco. She reports that she does not drink alcohol or use drugs.  Allergies: No Known Allergies  Medications:  Prior to Admission:  Medications Prior to Admission  Medication Sig Dispense Refill Last Dose  . acetaminophen (TYLENOL) 500 MG tablet Take 1,500 mg by mouth every 6 (six) hours as needed (pain.).   Past Week at Unknown time  . ciprofloxacin (CIPRO) 500 MG tablet Take 1 tablet (500 mg total) by mouth 2 (two) times daily. One po bid x 10 days 20 tablet 0 08/17/2017 at Unknown time  . cyclobenzaprine (FLEXERIL) 5 MG tablet Take 5 mg by mouth 3 (three) times daily.   Past Week at Unknown time  . HYDROcodone-acetaminophen (NORCO) 5-325 MG tablet Take 1 tablet by mouth every 4 (four) hours as needed. 20 tablet 0 08/17/2017 at Unknown time  . ibuprofen (ADVIL,MOTRIN) 200 MG tablet Take 400 mg by mouth every 6 (six) hours as needed (pain.).   Past Month at Unknown time  . metroNIDAZOLE (FLAGYL) 500 MG tablet Take 1 tablet (500 mg total) by mouth 2 (two) times daily. One po bid x 10 days 20 tablet 0 08/17/2017 at Unknown time  Results for orders placed or performed during the hospital encounter of 08/17/17 (from the past 48 hour(s))  Comprehensive metabolic panel     Status: Abnormal   Collection Time: 08/17/17  4:44 PM  Result Value Ref Range   Sodium 139 135 - 145 mmol/L   Potassium 3.2 (L) 3.5 - 5.1 mmol/L   Chloride 105 98 - 111 mmol/L   CO2 23 22 - 32 mmol/L   Glucose, Bld 270 (H) 70 - 99 mg/dL   BUN 6 6 - 20 mg/dL   Creatinine, Ser 0.63 0.44 - 1.00 mg/dL   Calcium 8.5 (L) 8.9 - 10.3 mg/dL   Total Protein 7.2 6.5 - 8.1 g/dL   Albumin 3.3 (L) 3.5 - 5.0 g/dL   AST 10 (L) 15 - 41 U/L   ALT 10 0 - 44 U/L   Alkaline Phosphatase 55 38 - 126 U/L   Total Bilirubin 1.0 0.3 - 1.2 mg/dL   GFR calc non Af Amer >60 >60 mL/min   GFR calc Af Amer >60 >60 mL/min    Comment: (NOTE) The  eGFR has been calculated using the CKD EPI equation. This calculation has not been validated in all clinical situations. eGFR's persistently <60 mL/min signify possible Chronic Kidney Disease.    Anion gap 11 5 - 15    Comment: Performed at [facility], [place] [address] [place], [place] [address]  Lipase, blood     Status: None   Collection Time: 08/17/17  4:44 PM  Result Value Ref Range   Lipase 22 11 - 51 U/L    Comment: Performed at [facility], [place] [address] [place], [place] [address]  CBC with Diff     Status: Abnormal   Collection Time: 08/17/17  4:44 PM  Result Value Ref Range   WBC 16.7 (H) 4.0 - 10.5 K/uL   RBC 4.72 3.87 - 5.11 MIL/uL   Hemoglobin 13.0 12.0 - 15.0 g/dL   HCT 39.6 36.0 - 46.0 %   MCV 83.9 78.0 - 100.0 fL   MCH 27.5 26.0 - 34.0 pg   MCHC 32.8 30.0 - 36.0 g/dL   RDW 13.8 11.5 - 15.5 %   Platelets 319 150 - 400 K/uL   Neutrophils Relative % 85 %   Neutro Abs 14.1 (H) 1.7 - 7.7 K/uL   Lymphocytes Relative 9 %   Lymphs Abs 1.5 0.7 - 4.0 K/uL   Monocytes Relative 6 %   Monocytes Absolute 1.0 0.1 - 1.0 K/uL   Eosinophils Relative 0 %   Eosinophils Absolute 0.0 0.0 - 0.7 K/uL   Basophils Relative 0 %   Basophils Absolute 0.0 0.0 - 0.1 K/uL    Comment: Performed at [facility], [place] [address] [place], [place] [address]  Urinalysis, Routine w reflex microscopic     Status: Abnormal   Collection Time: 08/17/17  4:44 PM  Result Value Ref Range   Color, Urine YELLOW YELLOW   APPearance CLEAR CLEAR   Specific Gravity, Urine >1.046 (H) 1.005 - 1.030   pH 6.0 5.0 - 8.0   Glucose, UA 150 (A) NEGATIVE mg/dL   Hgb urine dipstick MODERATE (A) NEGATIVE   Bilirubin Urine NEGATIVE NEGATIVE   Ketones, ur NEGATIVE NEGATIVE mg/dL   Protein, ur NEGATIVE NEGATIVE mg/dL   Nitrite NEGATIVE NEGATIVE   Leukocytes, UA LARGE (A) NEGATIVE   RBC / HPF 6-10 0 - 5 RBC/hpf   WBC, UA >50 (H) 0 - 5 WBC/hpf  Bacteria, UA  RARE (A) NONE SEEN   Squamous Epithelial / LPF 0-5 0 - 5   Mucus PRESENT     Comment: Performed at [facility], [place] [address], [place], [place] [address]  I-Stat CG4 Lactic Acid, ED     Status: None   Collection Time: 08/17/17  4:59 PM  Result Value Ref Range   Lactic Acid, Venous 1.23 0.5 - 1.9 mmol/L  I-Stat Chem 8, ED     Status: Abnormal   Collection Time: 08/17/17  5:05 PM  Result Value Ref Range   Sodium 140 135 - 145 mmol/L   Potassium 3.2 (L) 3.5 - 5.1 mmol/L   Chloride 104 98 - 111 mmol/L   BUN 4 (L) 6 - 20 mg/dL   Creatinine, Ser 0.50 0.44 - 1.00 mg/dL   Glucose, Bld 271 (H) 70 - 99 mg/dL   Calcium, Ion 1.07 (L) 1.15 - 1.40 mmol/L   TCO2 23 22 - 32 mmol/L   Hemoglobin 13.9 12.0 - 15.0 g/dL   HCT 41.0 36.0 - 46.0 %  I-Stat beta hCG blood, ED     Status: None   Collection Time: 08/17/17  5:07 PM  Result Value Ref Range   I-stat hCG, quantitative <5.0 <5 mIU/mL   Comment 3            Comment:   GEST. AGE      CONC.  (mIU/mL)   <=1 WEEK        5 - 50     2 WEEKS       50 - 500     3 WEEKS       100 - 10,000     4 WEEKS     1,000 - 30,000        FEMALE AND NON-PREGNANT FEMALE:     LESS THAN 5 mIU/mL     Ct Abdomen Pelvis W Contrast  Addendum Date: 08/17/2017   ADDENDUM REPORT: 08/17/2017 19:18 ADDENDUM: These results were discussed by telephone on 08/17/2017 at 7:17 pm to Dr. Lennice Sites , who verbally acknowledged these results. Surgical consultation was recommended. Electronically Signed   By: Richardean Sale M.D.   On: 08/17/2017 19:18   Result Date: 08/17/2017 CLINICAL DATA:  Seven day history of diffuse abdominal pain with some nausea. Concern for perforation. EXAM: CT ABDOMEN AND PELVIS WITH CONTRAST TECHNIQUE: Multidetector CT imaging of the abdomen and pelvis was performed using the standard protocol following bolus administration of intravenous contrast. CONTRAST:  161m ISOVUE-300 IOPAMIDOL (ISOVUE-300) INJECTION 61% COMPARISON:  CT  08/13/2017 FINDINGS: Lower chest: Mild mosaic attenuation in both lung bases, likely atelectasis. No focal airspace disease, pleural or pericardial effusion. Hepatobiliary: Subjective hepatic steatosis without focal abnormality. Probable sludge in the gallbladder lumen. No calcified gallstone, wall thickening or biliary dilatation. Pancreas: Unremarkable. No pancreatic ductal dilatation or surrounding inflammatory changes. Spleen: Normal in size without focal abnormality. Adrenals/Urinary Tract: Both adrenal glands appear normal. The kidneys appear normal without evidence of urinary tract calculus, suspicious lesion or hydronephrosis. No bladder abnormalities are seen. Stomach/Bowel: The stomach and proximal small bowel appear normal. There is questionable mild small bowel wall thickening in the left mid abdomen (images 55-61/2). The appendix and colon appear stable with mild sigmoid diverticulosis, but no focal associated inflammation. There is no significant bowel distension. There is diffuse edema throughout the mesenteric fat with a small amount of interloop fluid (notably on images 54, 69 and 72 of series 2). There is also  moderate amount of free pelvic fluid. Vascular/Lymphatic: There are no enlarged abdominal or pelvic lymph nodes. Mildly prominent external iliac and inguinal lymph nodes bilaterally are similar to the previous study. The contrast bolus is suboptimal, but no acute vascular findings are demonstrated. There is mild aortoiliac atherosclerosis. Reproductive: The uterus and ovaries appear normal. No adnexal mass. Other: As above, there is diffuse edema throughout the mesenteric fat with a small amount of interloop fluid. There is no generalized ascites, drainable fluid collection or free intraperitoneal air. No abdominal wall hernia. Musculoskeletal: No acute or significant osseous findings. IMPRESSION: 1. New acute diffuse mesenteric inflammation with interloop and free pelvic fluid. There is a  questionable loop of mid small bowel with associated wall thickening. Appearance could be secondary to vasculitis, peritonitis or acute enteritis. 2. The appendix and colon demonstrate no acute findings. There is mild sigmoid diverticulosis. 3. No drainable fluid collection, bowel obstruction or free air. 4. Mild aortic and branch vessel atherosclerosis without evidence of acute vascular occlusion. Electronically Signed: By: Richardean Sale M.D. On: 08/17/2017 18:10    Review of Systems  Constitutional: Positive for chills and fever.  HENT: Negative.   Eyes: Negative.   Respiratory: Negative.   Cardiovascular: Negative.   Gastrointestinal: Positive for abdominal pain and nausea.  Genitourinary: Negative.   Musculoskeletal: Negative.   Skin: Negative.   Neurological: Negative.   Endo/Heme/Allergies: Negative.   Psychiatric/Behavioral: Negative.   All other systems reviewed and are negative.  Blood pressure 112/78, pulse (!) 119, temperature (!) 97.5 F (36.4 C), temperature source Oral, resp. rate 19, last menstrual period 08/09/2017, SpO2 96 %. Physical Exam  Constitutional: She appears well-developed and well-nourished. She appears distressed (Looks uncomfortable).  HENT:  Head: Normocephalic.  Right Ear: External ear normal.  Left Ear: External ear normal.  Mouth/Throat: Oropharynx is clear and moist.  Eyes: Pupils are equal, round, and reactive to light. Conjunctivae and EOM are normal. No scleral icterus.  Neck: Neck supple. No JVD present. No tracheal deviation present. No thyromegaly present.  Cardiovascular: Regular rhythm, normal heart sounds, intact distal pulses and normal pulses. Tachycardia present. Exam reveals no gallop and no friction rub.  No murmur heard. Respiratory: Effort normal and breath sounds normal. No stridor. No respiratory distress. She has no wheezes. She has no rales. She exhibits no tenderness.  GI: Soft. Bowel sounds are normal. She exhibits distension  (protuberant). She exhibits no mass. There is tenderness (central abdomen). There is guarding (voluntary guarding.). There is no rebound.  Musculoskeletal: Normal range of motion. She exhibits no edema, tenderness or deformity.  Lymphadenopathy:    She has no cervical adenopathy.  Neurological: She is alert. Coordination normal.  Skin: Skin is warm and dry. No rash noted. She is not diaphoretic. No erythema. No pallor.  Psychiatric: She has a normal mood and affect. Her behavior is normal. Judgment and thought content normal.    Assessment/Plan: Enteritis  Unclear etiology ? Diverticulitis earlier this week.  Possible secondary inflammation.   No evidence of perforation or ischemia.   ? Infectious vs autoimmune.  Pt with family h/o lupus and sarcoid.    Would treat with IV fluids, bowel rest, IV antibiotics Will recheck abdominal xray in AM given level of tenderness.    Stark Klein 08/17/2017, 10:24 PM

## 2017-08-17 NOTE — Progress Notes (Signed)
Pharmacy Antibiotic Note  Misty [name] is a 37 y.o. female admitted on 08/17/2017 with IAI.  Pharmacy has been consulted for Zosyn dosing.  Plan: Zosyn 3.375g IV q8h (4 hour infusion).  Will sign off     Temp (24hrs), Avg:100.4 F (38 C), Min:100.4 F (38 C), Max:100.4 F (38 C)  Recent Labs  Lab 08/13/17 2009 08/17/17 1644 08/17/17 1659 08/17/17 1705  WBC 14.0* 16.7*  --   --   CREATININE 0.61 0.63  --  0.50  LATICACIDVEN  --   --  1.23  --     Estimated Creatinine Clearance: 137.3 mL/min (by C-G formula based on SCr of 0.5 mg/dL).    No Known Allergies   Thank you for allowing pharmacy to be a part of this patient's care.  Kara Mead 08/17/2017 8:05 PM

## 2017-08-17 NOTE — ED Provider Notes (Addendum)
[facility] DEPT Provider Note   CSN: [id number] Arrival date & time: 08/17/17  1609     History   Chief Complaint Chief Complaint  Patient presents with  . Abdominal Pain    HPI Misty [name] is a 37 y.o. female.  The history is provided by the patient.  Abdominal Pain   This is a recurrent problem. The current episode started more than 1 week ago. The problem occurs daily. The problem has been rapidly worsening. The pain is located in the generalized abdominal region and LLQ. The quality of the pain is pressure-like. The pain is at a severity of 9/10. The pain is severe. Associated symptoms include fever and nausea. Pertinent negatives include anorexia, melena, vomiting, constipation, dysuria, frequency, hematuria, headaches, arthralgias and myalgias. The symptoms are aggravated by palpation. Nothing relieves the symptoms. Past workup includes CT scan. Past workup comments: Patient currently on antibiotics for diverticulitis diagnosed 4 days ago.    Past Medical History:  Diagnosis Date  . Chest pain    multiple ED evaluations, most likely musculoskeletal (cervical radiculopathy), needs MRI spine  . Diabetes mellitus   . GERD (gastroesophageal reflux disease)   . Gestational diabetes   . PCOD (polycystic ovarian disease)    Followed by [facility], has been on metformin    Patient Active Problem List   Diagnosis Date Noted  . Peritonitis ([place]) 08/17/2017  . Morbid obesity ([place]) 08/17/2017  . Sepsis ([place]) 08/17/2017  . Hypokalemia 08/17/2017  . Diffuse abdominal pain 08/15/2017  . Headache 02/28/2017  . Heart palpitations 06/04/2012  . Tobacco use 06/03/2012  . Preventative health care 05/03/2011  . Diabetes type 2, uncontrolled ([place]) 02/24/2008  . Polycystic ovarian disease 07/27/2006  . GERD 03/02/2006    Past Surgical History:  Procedure Laterality Date  . CESAREAN SECTION  12/14/2011   Procedure: CESAREAN SECTION;  Surgeon:  Lahoma Crocker, MD;  Location: [facility];  Service: Obstetrics;  Laterality: N/A;  Primary Cesarean Section Delivery Girl @ 0616, Apgars     OB History    Gravida  1   Para  1   Term  1   Preterm  0   AB  0   Living  1     SAB  0   TAB  0   Ectopic  0   Multiple  0   Live Births  1            Home Medications    Prior to Admission medications   Medication Sig Start Date End Date Taking? Authorizing Provider  acetaminophen (TYLENOL) 500 MG tablet Take 1,500 mg by mouth every 6 (six) hours as needed (pain.).   Yes [provider]  ciprofloxacin (CIPRO) 500 MG tablet Take 1 tablet (500 mg total) by mouth 2 (two) times daily. One po bid x 10 days 08/13/17  Yes Daleen Bo, MD  cyclobenzaprine (FLEXERIL) 5 MG tablet Take 5 mg by mouth 3 (three) times daily.   Yes [provider]  HYDROcodone-acetaminophen (NORCO) 5-325 MG tablet Take 1 tablet by mouth every 4 (four) hours as needed. 08/13/17  Yes Daleen Bo, MD  ibuprofen (ADVIL,MOTRIN) 200 MG tablet Take 400 mg by mouth every 6 (six) hours as needed (pain.).   Yes [provider]  metroNIDAZOLE (FLAGYL) 500 MG tablet Take 1 tablet (500 mg total) by mouth 2 (two) times daily. One po bid x 10 days 08/13/17  Yes Daleen Bo, MD    Family History Family  History  Problem Relation Age of Onset  . Diabetes Mother   . Hypertension Mother   . Stomach cancer Mother        diet at 64  . Heart disease Father        diet at 36  . Diabetes Sister   . Hypertension Sister   . Diabetes Brother   . Cancer Maternal Grandmother   . Cancer Paternal Grandmother     Social History Social History   Tobacco Use  . Smoking status: Current Every Day Smoker    Packs/day: 0.50    Types: Cigarettes  . Smokeless tobacco: Never Used  . Tobacco comment: 0.5ppd  Substance Use Topics  . Alcohol use: No    Alcohol/week: 0.0 standard drinks  . Drug use: No     Allergies   Patient has no known  allergies.   Review of Systems Review of Systems  Constitutional: Positive for fever. Negative for chills.  HENT: Negative for ear pain and sore throat.   Eyes: Negative for pain and visual disturbance.  Respiratory: Negative for cough and shortness of breath.   Cardiovascular: Negative for chest pain and palpitations.  Gastrointestinal: Positive for abdominal pain and nausea. Negative for abdominal distention, anal bleeding, anorexia, blood in stool, constipation, melena and vomiting.  Genitourinary: Negative for dysuria, frequency and hematuria.  Musculoskeletal: Negative for arthralgias, back pain and myalgias.  Skin: Negative for color change, rash and wound.  Neurological: Negative for seizures, syncope and headaches.  All other systems reviewed and are negative.    Physical Exam Updated Vital Signs  ED Triage Vitals [08/17/17 1618]  Enc Vitals Group     BP 121/71     Pulse Rate (!) 140     Resp 20     Temp (!) 100.4 F (38 C)     Temp Source Oral     SpO2 96 %     Weight      Height      Head Circumference      Peak Flow      Pain Score      Pain Loc      Pain Edu?      Excl. in [place]?     Physical Exam  Constitutional: She appears well-developed and well-nourished. She appears distressed.  HENT:  Head: Normocephalic and atraumatic.  Eyes: Pupils are equal, round, and reactive to light. Conjunctivae are normal.  Neck: Normal range of motion. Neck supple.  Cardiovascular: Regular rhythm, normal heart sounds and intact distal pulses. Tachycardia present.  No murmur heard. Pulmonary/Chest: Effort normal and breath sounds normal. No stridor. No respiratory distress. She has no wheezes.  Abdominal: She exhibits distension. There is tenderness. There is guarding.  Musculoskeletal: Normal range of motion. She exhibits no edema.  Neurological: She is alert.  Skin: Skin is warm and dry. Capillary refill takes less than 2 seconds. No rash noted.  Psychiatric: She has a  normal mood and affect.  Nursing note and vitals reviewed.    ED Treatments / Results  Labs (all labs ordered are listed, but only abnormal results are displayed) Labs Reviewed  COMPREHENSIVE METABOLIC PANEL - Abnormal; Notable for the following components:      Result Value   Potassium 3.2 (*)    Glucose, Bld 270 (*)    Calcium 8.5 (*)    Albumin 3.3 (*)    AST 10 (*)    All other components within normal limits  CBC WITH DIFFERENTIAL/PLATELET -  Abnormal; Notable for the following components:   WBC 16.7 (*)    Neutro Abs 14.1 (*)    All other components within normal limits  URINALYSIS, ROUTINE W REFLEX MICROSCOPIC - Abnormal; Notable for the following components:   Specific Gravity, Urine >1.046 (*)    Glucose, UA 150 (*)    Hgb urine dipstick MODERATE (*)    Leukocytes, UA LARGE (*)    WBC, UA >50 (*)    Bacteria, UA RARE (*)    All other components within normal limits  GLUCOSE, CAPILLARY - Abnormal; Notable for the following components:   Glucose-Capillary 208 (*)    All other components within normal limits  I-STAT CHEM 8, ED - Abnormal; Notable for the following components:   Potassium 3.2 (*)    BUN 4 (*)    Glucose, Bld 271 (*)    Calcium, Ion 1.07 (*)    All other components within normal limits  CULTURE, BLOOD (ROUTINE X 2)  CULTURE, BLOOD (ROUTINE X 2)  MRSA PCR SCREENING  LIPASE, BLOOD  HIV ANTIBODY (ROUTINE TESTING)  COMPREHENSIVE METABOLIC PANEL  CBC  MAGNESIUM  I-STAT BETA HCG BLOOD, ED ([facility], [facility], [facility] ONLY)  I-STAT CG4 LACTIC ACID, ED    EKG None  Radiology Ct Abdomen Pelvis W Contrast  Addendum Date: 08/17/2017   ADDENDUM REPORT: 08/17/2017 19:18 ADDENDUM: These results were discussed by telephone on 08/17/2017 at 7:17 pm to Dr. Lennice Sites , who verbally acknowledged these results. Surgical consultation was recommended. Electronically Signed   By: Richardean Sale M.D.   On: 08/17/2017 19:18   Result Date: 08/17/2017 CLINICAL DATA:  Seven day  history of diffuse abdominal pain with some nausea. Concern for perforation. EXAM: CT ABDOMEN AND PELVIS WITH CONTRAST TECHNIQUE: Multidetector CT imaging of the abdomen and pelvis was performed using the standard protocol following bolus administration of intravenous contrast. CONTRAST:  134m ISOVUE-300 IOPAMIDOL (ISOVUE-300) INJECTION 61% COMPARISON:  CT 08/13/2017 FINDINGS: Lower chest: Mild mosaic attenuation in both lung bases, likely atelectasis. No focal airspace disease, pleural or pericardial effusion. Hepatobiliary: Subjective hepatic steatosis without focal abnormality. Probable sludge in the gallbladder lumen. No calcified gallstone, wall thickening or biliary dilatation. Pancreas: Unremarkable. No pancreatic ductal dilatation or surrounding inflammatory changes. Spleen: Normal in size without focal abnormality. Adrenals/Urinary Tract: Both adrenal glands appear normal. The kidneys appear normal without evidence of urinary tract calculus, suspicious lesion or hydronephrosis. No bladder abnormalities are seen. Stomach/Bowel: The stomach and proximal small bowel appear normal. There is questionable mild small bowel wall thickening in the left mid abdomen (images 55-61/2). The appendix and colon appear stable with mild sigmoid diverticulosis, but no focal associated inflammation. There is no significant bowel distension. There is diffuse edema throughout the mesenteric fat with a small amount of interloop fluid (notably on images 54, 69 and 72 of series 2). There is also moderate amount of free pelvic fluid. Vascular/Lymphatic: There are no enlarged abdominal or pelvic lymph nodes. Mildly prominent external iliac and inguinal lymph nodes bilaterally are similar to the previous study. The contrast bolus is suboptimal, but no acute vascular findings are demonstrated. There is mild aortoiliac atherosclerosis. Reproductive: The uterus and ovaries appear normal. No adnexal mass. Other: As above, there is  diffuse edema throughout the mesenteric fat with a small amount of interloop fluid. There is no generalized ascites, drainable fluid collection or free intraperitoneal air. No abdominal wall hernia. Musculoskeletal: No acute or significant osseous findings. IMPRESSION: 1. New acute diffuse mesenteric inflammation with interloop  and free pelvic fluid. There is a questionable loop of mid small bowel with associated wall thickening. Appearance could be secondary to vasculitis, peritonitis or acute enteritis. 2. The appendix and colon demonstrate no acute findings. There is mild sigmoid diverticulosis. 3. No drainable fluid collection, bowel obstruction or free air. 4. Mild aortic and branch vessel atherosclerosis without evidence of acute vascular occlusion. Electronically Signed: By: Richardean Sale M.D. On: 08/17/2017 18:10    Procedures .Critical Care Performed by: Lennice Sites, DO Authorized by: Lennice Sites, DO   Critical care provider statement:    Critical care time (minutes):  35   Critical care time was exclusive of:  Separately billable procedures and treating other patients and teaching time   Critical care was necessary to treat or prevent imminent or life-threatening deterioration of the following conditions:  Sepsis   Critical care was time spent personally by me on the following activities:  Blood draw for specimens, development of treatment plan with patient or surrogate, discussions with consultants, discussions with primary provider, evaluation of patient's response to treatment, examination of patient, ordering and review of laboratory studies, ordering and performing treatments and interventions, ordering and review of radiographic studies, pulse oximetry, re-evaluation of patient's condition and review of old charts   I assumed direction of critical care for this patient from another provider in my specialty: no     (including critical care time)  Medications Ordered in  ED Medications  sodium chloride 0.9 % bolus 1,000 mL (0 mLs Intravenous Stopped 08/17/17 1731)    And  0.9 %  sodium chloride infusion ( Intravenous Transfusing/Transfer 08/17/17 2146)  sodium chloride 0.9 % bolus 1,000 mL (has no administration in time range)  insulin aspart (novoLOG) injection 0-15 Units (has no administration in time range)  insulin aspart (novoLOG) injection 0-5 Units (has no administration in time range)  HYDROmorphone (DILAUDID) injection 1 mg (1 mg Intravenous Given 08/17/17 2220)  piperacillin-tazobactam (ZOSYN) IVPB 3.375 g (has no administration in time range)  0.9 % NaCl with KCl 20 mEq/ L  infusion ( Intravenous Rate/Dose Verify 08/18/17 0000)  fentaNYL (SUBLIMAZE) injection 50 mcg (50 mcg Intravenous Given 08/17/17 1655)  ondansetron (ZOFRAN) injection 4 mg (4 mg Intravenous Given 08/17/17 1655)  piperacillin-tazobactam (ZOSYN) IVPB 3.375 g (0 g Intravenous Stopped 08/17/17 1725)  iopamidol (ISOVUE-300) 61 % injection (100 mLs  Contrast Given 08/17/17 1736)  HYDROmorphone (DILAUDID) injection 1 mg (1 mg Intravenous Given 08/17/17 1908)     Initial Impression / Assessment and Plan / ED Course  I have reviewed the triage vital signs and the nursing notes.  Pertinent labs & imaging results that were available during my care of the patient were reviewed by me and considered in my medical decision making (see chart for details).     Misty [name] is a 37 year old female with no significant medical history who presents to the ED with abdominal pain.  Patient with tachycardia, fever, abdominal pain upon arrival.  Patient on antibiotics for acute diverticulitis.  States that symptoms have gotten worse including worsening nausea.  Patient denies any diarrhea, bloody stools.  Patient is uncomfortable, peritoneal tach on exam.  Concern for possible perforation, worsening infection, abscess.  Sepsis order initiated given tachycardia and fever.  Patient empirically  started on IV Zosyn and given IV fluids, IV fentanyl, IV Zofran.  Blood cultures collected, urinalysis ordered.  Patient with leukocytosis but otherwise normal lactic acid.  No significant electrolyte abnormality, kidney injury.  Normal lipase doubt pancreatitis.  Pregnancy test negative.  Urinalysis shows no sign of infection.  CT of the abdomen and pelvis as discussed with radiology over the phone shows mesenteric inflammation.  Differential is wide including vasculitis versus peritonitis versus enteritis.  No obvious obstruction, free air, abscess.  Dr. Barry Dienes with surgery was consulted for evaluation given concern for peritonitis and patient to be admitted to medicine service for further IV antibiotics, IV fluids.  Patient hemodynamically stable throughout my care.  Final Clinical Impressions(s) / ED Diagnoses   Final diagnoses:  Generalized abdominal pain  Peritonitis ([place])  Sepsis, due to unspecified organism [facility])    ED Discharge Orders    None       Lennice Sites, DO 08/18/17 [place], [place], DO 09/04/17 1119

## 2017-08-18 ENCOUNTER — Inpatient Hospital Stay (HOSPITAL_COMMUNITY): Payer: Self-pay

## 2017-08-18 DIAGNOSIS — E876 Hypokalemia: Secondary | ICD-10-CM

## 2017-08-18 DIAGNOSIS — K659 Peritonitis, unspecified: Secondary | ICD-10-CM

## 2017-08-18 DIAGNOSIS — E1165 Type 2 diabetes mellitus with hyperglycemia: Secondary | ICD-10-CM

## 2017-08-18 DIAGNOSIS — Z720 Tobacco use: Secondary | ICD-10-CM

## 2017-08-18 LAB — COMPREHENSIVE METABOLIC PANEL
ALBUMIN: 2.7 g/dL — AB (ref 3.5–5.0)
ALT: 9 U/L (ref 0–44)
AST: 8 U/L — ABNORMAL LOW (ref 15–41)
Alkaline Phosphatase: 47 U/L (ref 38–126)
Anion gap: 7 (ref 5–15)
BILIRUBIN TOTAL: 0.7 mg/dL (ref 0.3–1.2)
BUN: 6 mg/dL (ref 6–20)
CO2: 26 mmol/L (ref 22–32)
Calcium: 7.9 mg/dL — ABNORMAL LOW (ref 8.9–10.3)
Chloride: 109 mmol/L (ref 98–111)
Creatinine, Ser: 0.63 mg/dL (ref 0.44–1.00)
GFR calc non Af Amer: >60 mL/min (ref >60)
GLUCOSE: 213 mg/dL — AB (ref 70–99)
POTASSIUM: 3.3 mmol/L — AB (ref 3.5–5.1)
Sodium: 142 mmol/L (ref 135–145)
TOTAL PROTEIN: 6.3 g/dL — AB (ref 6.5–8.1)

## 2017-08-18 LAB — CBC
HEMATOCRIT: 36.9 % (ref 36.0–46.0)
HEMOGLOBIN: 11.8 g/dL — AB (ref 12.0–15.0)
MCH: 27.4 pg (ref 26.0–34.0)
MCHC: 32 g/dL (ref 30.0–36.0)
MCV: 85.6 fL (ref 78.0–100.0)
Platelets: 274 10*3/uL (ref 150–400)
RBC: 4.31 MIL/uL (ref 3.87–5.11)
RDW: 13.9 % (ref 11.5–15.5)
WBC: 13.8 10*3/uL — AB (ref 4.0–10.5)

## 2017-08-18 LAB — GLUCOSE, CAPILLARY
GLUCOSE-CAPILLARY: 114 mg/dL — AB (ref 70–99)
GLUCOSE-CAPILLARY: 103 mg/dL — AB (ref 70–99)
Glucose-Capillary: 151 mg/dL — ABNORMAL HIGH (ref 70–99)
Glucose-Capillary: 156 mg/dL — ABNORMAL HIGH (ref 70–99)

## 2017-08-18 LAB — MAGNESIUM: Magnesium: 2 mg/dL (ref 1.7–2.4)

## 2017-08-18 LAB — POTASSIUM: Potassium: 3.4 mmol/L — ABNORMAL LOW (ref 3.5–5.1)

## 2017-08-18 LAB — HIV ANTIBODY (ROUTINE TESTING W REFLEX): HIV Screen 4th Generation wRfx: NONREACTIVE

## 2017-08-18 LAB — MRSA PCR SCREENING: MRSA by PCR: NEGATIVE

## 2017-08-18 MED ORDER — FAMOTIDINE IN NACL 20-0.9 MG/50ML-% IV SOLN
20.0000 mg | INTRAVENOUS | Status: DC
  Administered 2017-08-18 – 2017-08-19 (×2): 20 mg via INTRAVENOUS
  Filled 2017-08-18 (×2): qty 50

## 2017-08-18 MED ORDER — LACTATED RINGERS IV SOLN
INTRAVENOUS | Status: DC
  Administered 2017-08-18 (×2): 100 mL/h via INTRAVENOUS
  Administered 2017-08-19 (×2): via INTRAVENOUS

## 2017-08-18 MED ORDER — POTASSIUM CHLORIDE 10 MEQ/100ML IV SOLN: 10.0000 meq | INTRAVENOUS | Status: AC

## 2017-08-18 MED ORDER — POTASSIUM CHLORIDE 10 MEQ/100ML IV SOLN
10.0000 meq | INTRAVENOUS | Status: AC
  Administered 2017-08-18 (×2): 10 meq via INTRAVENOUS
  Filled 2017-08-18 (×2): qty 100

## 2017-08-18 MED ORDER — ONDANSETRON HCL 4 MG/2ML IJ SOLN: 4.0000 mg | Freq: Four times a day (QID) | INTRAMUSCULAR | Status: DC | PRN

## 2017-08-18 NOTE — Plan of Care (Signed)
  Problem: Safety: Goal: Ability to remain free from injury will improve Outcome: Progressing   

## 2017-08-18 NOTE — Progress Notes (Signed)
Subjective/Chief Complaint: Pain is "maybe a little bit better."  No n/v.  Plain films this Am negative.    Objective: Vital signs in last 24 hours: Temp:  [97.5 F (36.4 C)-100.4 F (38 C)] 99.2 F (37.3 C) (08/17 0450) Pulse Rate:  [114-140] 114 (08/17 0100) Resp:  [0-32] 0 (08/17 0200) BP: (100-134)/(35-78) 122/56 (08/17 0200) SpO2:  [91 %-99 %] 93 % (08/17 0100)    Intake/Output from previous day: 08/16 0701 - 08/17 0700 In: 2865.4 [I.V.:1802; IV Piggyback:1063.3] Out: -  Intake/Output this shift: No intake/output data recorded.  General appearance: alert, cooperative and mild distress Resp: breathing comfortably.  CTAB GI: soft, distended, tender across mid abdomen.  Lab Results:  Recent Labs    08/17/17 1644 08/17/17 1705 08/18/17 0316  WBC 16.7*  --  13.8*  HGB 13.0 13.9 11.8*  HCT 39.6 41.0 36.9  PLT 319  --  274   BMET Recent Labs    08/17/17 1644 08/17/17 1705 08/18/17 0316  NA 139 140 142  K 3.2* 3.2* 3.3*  CL 105 104 109  CO2 23  --  26  GLUCOSE 270* 271* 213*  BUN 6 4* 6  CREATININE 0.63 0.50 0.63  CALCIUM 8.5*  --  7.9*   PT/INR No results for input(s): LABPROT, INR in the last 72 hours. ABG No results for input(s): PHART, HCO3 in the last 72 hours.  Invalid input(s): PCO2, PO2  Studies/Results: Ct Abdomen Pelvis W Contrast  Addendum Date: 08/17/2017   ADDENDUM REPORT: 08/17/2017 19:18 ADDENDUM: These results were discussed by telephone on 08/17/2017 at 7:17 pm to Dr. Lennice Sites , who verbally acknowledged these results. Surgical consultation was recommended. Electronically Signed   By: Richardean Sale M.D.   On: 08/17/2017 19:18   Result Date: 08/17/2017 CLINICAL DATA:  Seven day history of diffuse abdominal pain with some nausea. Concern for perforation. EXAM: CT ABDOMEN AND PELVIS WITH CONTRAST TECHNIQUE: Multidetector CT imaging of the abdomen and pelvis was performed using the standard protocol following bolus administration  of intravenous contrast. CONTRAST:  17m ISOVUE-300 IOPAMIDOL (ISOVUE-300) INJECTION 61% COMPARISON:  CT 08/13/2017 FINDINGS: Lower chest: Mild mosaic attenuation in both lung bases, likely atelectasis. No focal airspace disease, pleural or pericardial effusion. Hepatobiliary: Subjective hepatic steatosis without focal abnormality. Probable sludge in the gallbladder lumen. No calcified gallstone, wall thickening or biliary dilatation. Pancreas: Unremarkable. No pancreatic ductal dilatation or surrounding inflammatory changes. Spleen: Normal in size without focal abnormality. Adrenals/Urinary Tract: Both adrenal glands appear normal. The kidneys appear normal without evidence of urinary tract calculus, suspicious lesion or hydronephrosis. No bladder abnormalities are seen. Stomach/Bowel: The stomach and proximal small bowel appear normal. There is questionable mild small bowel wall thickening in the left mid abdomen (images 55-61/2). The appendix and colon appear stable with mild sigmoid diverticulosis, but no focal associated inflammation. There is no significant bowel distension. There is diffuse edema throughout the mesenteric fat with a small amount of interloop fluid (notably on images 54, 69 and 72 of series 2). There is also moderate amount of free pelvic fluid. Vascular/Lymphatic: There are no enlarged abdominal or pelvic lymph nodes. Mildly prominent external iliac and inguinal lymph nodes bilaterally are similar to the previous study. The contrast bolus is suboptimal, but no acute vascular findings are demonstrated. There is mild aortoiliac atherosclerosis. Reproductive: The uterus and ovaries appear normal. No adnexal mass. Other: As above, there is diffuse edema throughout the mesenteric fat with a small amount of interloop fluid. There is  no generalized ascites, drainable fluid collection or free intraperitoneal air. No abdominal wall hernia. Musculoskeletal: No acute or significant osseous findings.  IMPRESSION: 1. New acute diffuse mesenteric inflammation with interloop and free pelvic fluid. There is a questionable loop of mid small bowel with associated wall thickening. Appearance could be secondary to vasculitis, peritonitis or acute enteritis. 2. The appendix and colon demonstrate no acute findings. There is mild sigmoid diverticulosis. 3. No drainable fluid collection, bowel obstruction or free air. 4. Mild aortic and branch vessel atherosclerosis without evidence of acute vascular occlusion. Electronically Signed: By: Richardean Sale M.D. On: 08/17/2017 18:10   Dg Abd Portable 2v  Result Date: 08/18/2017 CLINICAL DATA:  Abdominal pain EXAM: PORTABLE ABDOMEN - 2 VIEW COMPARISON:  08/17/2017 FINDINGS: Scattered large and small bowel gas is noted. Contrast material is noted within the bladder from recent CT examination. No free air is seen. No bony abnormality is noted. IMPRESSION: No acute abnormality seen. Electronically Signed   By: Inez Catalina M.D.   On: 08/18/2017 07:16    Anti-infectives: Anti-infectives (From admission, onward)   Start     Dose/Rate Route Frequency Ordered Stop   08/18/17 0000  piperacillin-tazobactam (ZOSYN) IVPB 3.375 g     3.375 g 12.5 mL/hr over 240 Minutes Intravenous Every 8 hours 08/17/17 2006     08/17/17 1630  piperacillin-tazobactam (ZOSYN) IVPB 4.5 g  Status:  Discontinued     4.5 g 200 mL/hr over 30 Minutes Intravenous  Once 08/17/17 1624 08/17/17 1628   08/17/17 1630  piperacillin-tazobactam (ZOSYN) IVPB 3.375 g     3.375 g 100 mL/hr over 30 Minutes Intravenous  Once 08/17/17 1628 08/17/17 1725      Assessment/Plan: s/p * No surgery found * Enteritis.  Continue conservative management. NPO IVF IV antibiotics. Overall slight improvement since yesterday.      LOS: 1 day    Stark Klein 08/18/2017

## 2017-08-18 NOTE — Progress Notes (Signed)
Pt refuses 3rd and 4th run of K+. MD notified

## 2017-08-18 NOTE — Progress Notes (Signed)
Pt had 12 beat run of V tach, Dr Cathlean Sauer paged. Pt is asymptomatic sitting in bed talking on the phone

## 2017-08-18 NOTE — Progress Notes (Signed)
PROGRESS NOTE    Misty [name]  ZSM:[id number] DOB: 1980-09-13 DOA: 08/17/2017 PCP: Alphonzo Grieve, MD    Brief Narrative:  37 year old female who presented with abdominal pain.  She does have a significant past medical history for morbid obesity and type 2 diabetes mellitus.  Reported sudden epigastric abdominal pain associated with distention worse with no vomiting or diarrhea.  Recently treated for diverticulitis August 12, with a ciprofloxacin and metronidazole.  On her initial physical examination blood pressure 119/35, heart rate 126, respiratory rate 32, oxygen saturation 96%.  Mucous membranes, lungs clear to auscultation bilaterally, heart S1-S2 present, tachycardic, abdomen protuberant, distended, positive rebound and guarding.  No lower extremity edema.  139, potassium 3.2, chloride 105, bicarb 23, glucose 270, BUN 6, creatinine 0.60, white count 16.7, hemoglobin 13.0, hematocrit 39.6, platelets 319.  Urinalysis specific gravity greater than 1.046, white cells greater than 50, red cells 6-10.  CT of the abdomen with acute diffuse mesenteric inflammation with interloop and free pelvic fluid.  Questionable loop and mid small bowel wall thickening.  EKG sinus tachycardia, 135 beats per minutes, left axis deviation, normal intervals.  Patient was admitted to the hospital with a working diagnosis of peritonitis, possible acute enteritis.  Assessment & Plan:   Active Problems:   Diabetes type 2, uncontrolled ([place])   Tobacco use   Peritonitis (HCC)   Morbid obesity (HCC)   Sepsis (HCC)   Hypokalemia   1. Acute enteritis with peritonitis. Will continue conservative medical care with IV antibiotic therapy with Zosyn, continue IV fluids, as needed IV analgesics (hydromoprhine) and antiemetics. Will add IV antiacid therapy with famotidine. Patient with no bowel movement or passing gas, abdominal films with no air fluid levels, personally reviewed. Will continue to follow surgery  recommendations. Will check C diff when stool available. WBC trending down, today at 13.8 from 16.7.   2. Hypokalemia due to dehydration and diarrhea. Will correct K with IV Kcl 40 mwq x2 doses, and will follow on renal panel in am, will continue hydration with balanced electrolyte solution lactate ringers, at 100 ml per hour. Follow urine output.   3. T2DM with uncontrolled hyperglycemia. Patient currently NPO due to enteritis, serum glucose 271 and 213. At home not on antihyperglycemic therapy. Will continue insulin sliding scale for now and will calculate insulin requirements before starting basal insulin therapy.   4. Obesity. Calculated BMI 36, will consult nutrition when patient more stable.    DVT prophylaxis: enoxaparin   Code Status:  full Family Communication: no family at the bedside  Disposition Plan/ discharge barriers:  Pending clinical improvement    Consultants:   Surgery   Procedures:     Antimicrobials:   IV Zosyn     Subjective: Patient continue to have diffuse abdominal pain, moderate to severe, worse with movement and touch. No associated nausea or vomiting, improved with IV analgesics.   Objective: Vitals:   08/18/17 0005 08/18/17 0100 08/18/17 0200 08/18/17 0450  BP:  (!) 117/57 (!) 122/56   Pulse:  (!) 114    Resp:  (!) 25 (!) 0   Temp: (!) 97.5 F (36.4 C)   99.2 F (37.3 C)  TempSrc: Oral   Oral  SpO2:  93%      Intake/Output Summary (Last 24 hours) at 08/18/2017 0757 Last data filed at 08/18/2017 0200 Gross per 24 hour  Intake 2865.35 ml  Output -  Net 2865.35 ml   There were no vitals filed for this visit.  Examination:  General: deconditioned and ill looking appearing Neurology: Awake and alert, non focal  E ENT: mild pallor, no icterus, oral mucosa moist Cardiovascular: No JVD. S1-S2 present, rhythmic, no gallops, rubs, or murmurs. No lower extremity edema. Pulmonary: vesicular breath sounds bilaterally, adequate air movement, no  wheezing, rhonchi or rales. Gastrointestinal. Abdomen distended, tender to superficial palpation, decreased bowel sounds and tympanic to percussion,  no organomegaly.  Skin. No rashes Musculoskeletal: no joint deformities     Data Reviewed: I have personally reviewed following labs and imaging studies  CBC: Recent Labs  Lab 08/13/17 2009 08/17/17 1644 08/17/17 1705 08/18/17 0316  WBC 14.0* 16.7*  --  13.8*  NEUTROABS  --  14.1*  --   --   HGB 12.9 13.0 13.9 11.8*  HCT 40.2 39.6 41.0 36.9  MCV 84.8 83.9  --  85.6  PLT 260 319  --  470   Basic Metabolic Panel: Recent Labs  Lab 08/13/17 2009 08/17/17 1644 08/17/17 1705 08/18/17 0316  NA 142 139 140 142  K 3.5 3.2* 3.2* 3.3*  CL 108 105 104 109  CO2 24 23  --  26  GLUCOSE 209* 270* 271* 213*  BUN 6 6 4* 6  CREATININE 0.61 0.63 0.50 0.63  CALCIUM 8.9 8.5*  --  7.9*  MG  --   --   --  2.0   GFR: Estimated Creatinine Clearance: 137.3 mL/min (by C-G formula based on SCr of 0.63 mg/dL). Liver Function Tests: Recent Labs  Lab 08/13/17 2009 08/17/17 1644 08/18/17 0316  AST 16 10* 8*  ALT 18 10 9   ALKPHOS 63 55 47  BILITOT 0.6 1.0 0.7  PROT 7.5 7.2 6.3*  ALBUMIN 3.8 3.3* 2.7*   Recent Labs  Lab 08/13/17 2009 08/17/17 1644  LIPASE 28 22   No results for input(s): AMMONIA in the last 168 hours. Coagulation Profile: No results for input(s): INR, PROTIME in the last 168 hours. Cardiac Enzymes: No results for input(s): CKTOTAL, CKMB, CKMBINDEX, TROPONINI in the last 168 hours. BNP (last 3 results) No results for input(s): PROBNP in the last 8760 hours. HbA1C: No results for input(s): HGBA1C in the last 72 hours. CBG: Recent Labs  Lab 08/15/17 1421 08/17/17 2341  GLUCAP 185* 208*   Lipid Profile: No results for input(s): CHOL, HDL, LDLCALC, TRIG, CHOLHDL, LDLDIRECT in the last 72 hours. Thyroid Function Tests: No results for input(s): TSH, T4TOTAL, FREET4, T3FREE, THYROIDAB in the last 72 hours. Anemia  Panel: No results for input(s): VITAMINB12, FOLATE, FERRITIN, TIBC, IRON, RETICCTPCT in the last 72 hours.    Radiology Studies: I have reviewed all of the imaging during this hospital visit personally     Scheduled Meds: . insulin aspart  0-15 Units Subcutaneous TID WC  . insulin aspart  0-5 Units Subcutaneous QHS   Continuous Infusions: . sodium chloride 125 mL/hr at 08/18/17 0200  . 0.9 % NaCl with KCl 20 mEq / L 150 mL/hr at 08/18/17 0545  . piperacillin-tazobactam (ZOSYN)  IV Stopped (08/18/17 0508)  . sodium chloride       LOS: 1 day        Tawni Millers, MD [facility] Pager [phone]

## 2017-08-19 ENCOUNTER — Other Ambulatory Visit: Payer: Self-pay

## 2017-08-19 DIAGNOSIS — A419 Sepsis, unspecified organism: Principal | ICD-10-CM

## 2017-08-19 DIAGNOSIS — R1084 Generalized abdominal pain: Secondary | ICD-10-CM

## 2017-08-19 LAB — BASIC METABOLIC PANEL
Anion gap: 8 (ref 5–15)
BUN: 8 mg/dL (ref 6–20)
CALCIUM: 8 mg/dL — AB (ref 8.9–10.3)
CO2: 25 mmol/L (ref 22–32)
Chloride: 110 mmol/L (ref 98–111)
Creatinine, Ser: 0.62 mg/dL (ref 0.44–1.00)
GFR calc Af Amer: >60 mL/min (ref >60)
GFR calc non Af Amer: >60 mL/min (ref >60)
Glucose, Bld: 105 mg/dL — ABNORMAL HIGH (ref 70–99)
Potassium: 3.2 mmol/L — ABNORMAL LOW (ref 3.5–5.1)
Sodium: 143 mmol/L (ref 135–145)

## 2017-08-19 LAB — CBC WITH DIFFERENTIAL/PLATELET
BASOS PCT: 0 %
Basophils Absolute: 0 10*3/uL (ref 0.0–0.1)
Eosinophils Absolute: 0.3 10*3/uL (ref 0.0–0.7)
Eosinophils Relative: 2 %
HEMATOCRIT: 35.9 % — AB (ref 36.0–46.0)
Hemoglobin: 11.3 g/dL — ABNORMAL LOW (ref 12.0–15.0)
LYMPHS PCT: 19 %
Lymphs Abs: 2.6 10*3/uL (ref 0.7–4.0)
MCH: 27.2 pg (ref 26.0–34.0)
MCHC: 31.5 g/dL (ref 30.0–36.0)
MCV: 86.3 fL (ref 78.0–100.0)
Monocytes Absolute: 0.9 10*3/uL (ref 0.1–1.0)
Monocytes Relative: 6 %
NEUTROS ABS: 9.7 10*3/uL — AB (ref 1.7–7.7)
Neutrophils Relative %: 73 %
Platelets: 270 10*3/uL (ref 150–400)
RBC: 4.16 MIL/uL (ref 3.87–5.11)
RDW: 13.8 % (ref 11.5–15.5)
WBC: 13.4 10*3/uL — ABNORMAL HIGH (ref 4.0–10.5)

## 2017-08-19 LAB — GLUCOSE, CAPILLARY
GLUCOSE-CAPILLARY: 178 mg/dL — AB (ref 70–99)
GLUCOSE-CAPILLARY: 181 mg/dL — AB (ref 70–99)
Glucose-Capillary: 114 mg/dL — ABNORMAL HIGH (ref 70–99)

## 2017-08-19 LAB — C DIFFICILE QUICK SCREEN W PCR REFLEX
C DIFFICILE (CDIFF) INTERP: NOT DETECTED
C DIFFICILE (CDIFF) TOXIN: NEGATIVE
C Diff antigen: NEGATIVE

## 2017-08-19 MED ORDER — POTASSIUM CHLORIDE CRYS ER 20 MEQ PO TBCR
40.0000 meq | EXTENDED_RELEASE_TABLET | ORAL | Status: AC
Start: 2017-08-19 — End: 2017-08-19
  Administered 2017-08-19 (×2): 40 meq via ORAL
  Filled 2017-08-19 (×2): qty 2

## 2017-08-19 MED ORDER — SODIUM CHLORIDE 0.9 % IV SOLN
INTRAVENOUS | Status: DC | PRN
  Administered 2017-08-19 (×2): 250 mL via INTRAVENOUS

## 2017-08-19 NOTE — Progress Notes (Signed)
Subjective/Chief Complaint: Still hurting, but feels quite a bit better today.   Objective: Vital signs in last 24 hours: Temp:  [97.5 F (36.4 C)-98.9 F (37.2 C)] 98.9 F (37.2 C) (08/18 0344) Pulse Rate:  [89-101] 89 (08/17 2000) Resp:  [18-32] 21 (08/18 0626) BP: (111-153)/(52-93) 153/89 (08/18 0626) SpO2:  [96 %] 96 % (08/17 2000) Last BM Date: 08/17/17  Intake/Output from previous day: 08/17 0701 - 08/18 0700 In: 3935 [P.O.:840; I.V.:2779.4; IV Piggyback:315.6] Out: -  Intake/Output this shift: No intake/output data recorded.  General appearance: alert, cooperative and mild distress Resp: breathing comfortably.  CTAB GI: soft, less distended, less tenderness across mid abdomen.  Lab Results:  Recent Labs    08/18/17 0316 08/19/17 0325  WBC 13.8* 13.4*  HGB 11.8* 11.3*  HCT 36.9 35.9*  PLT 274 270   BMET Recent Labs    08/18/17 0316 08/18/17 1532 08/19/17 0325  NA 142  --  143  K 3.3* 3.4* 3.2*  CL 109  --  110  CO2 26  --  25  GLUCOSE 213*  --  105*  BUN 6  --  8  CREATININE 0.63  --  0.62  CALCIUM 7.9*  --  8.0*   PT/INR No results for input(s): LABPROT, INR in the last 72 hours. ABG No results for input(s): PHART, HCO3 in the last 72 hours.  Invalid input(s): PCO2, PO2  Studies/Results: Ct Abdomen Pelvis W Contrast  Addendum Date: 08/17/2017   ADDENDUM REPORT: 08/17/2017 19:18 ADDENDUM: These results were discussed by telephone on 08/17/2017 at 7:17 pm to Dr. Lennice Sites , who verbally acknowledged these results. Surgical consultation was recommended. Electronically Signed   By: Richardean Sale M.D.   On: 08/17/2017 19:18   Result Date: 08/17/2017 CLINICAL DATA:  Seven day history of diffuse abdominal pain with some nausea. Concern for perforation. EXAM: CT ABDOMEN AND PELVIS WITH CONTRAST TECHNIQUE: Multidetector CT imaging of the abdomen and pelvis was performed using the standard protocol following bolus administration of intravenous  contrast. CONTRAST:  158m ISOVUE-300 IOPAMIDOL (ISOVUE-300) INJECTION 61% COMPARISON:  CT 08/13/2017 FINDINGS: Lower chest: Mild mosaic attenuation in both lung bases, likely atelectasis. No focal airspace disease, pleural or pericardial effusion. Hepatobiliary: Subjective hepatic steatosis without focal abnormality. Probable sludge in the gallbladder lumen. No calcified gallstone, wall thickening or biliary dilatation. Pancreas: Unremarkable. No pancreatic ductal dilatation or surrounding inflammatory changes. Spleen: Normal in size without focal abnormality. Adrenals/Urinary Tract: Both adrenal glands appear normal. The kidneys appear normal without evidence of urinary tract calculus, suspicious lesion or hydronephrosis. No bladder abnormalities are seen. Stomach/Bowel: The stomach and proximal small bowel appear normal. There is questionable mild small bowel wall thickening in the left mid abdomen (images 55-61/2). The appendix and colon appear stable with mild sigmoid diverticulosis, but no focal associated inflammation. There is no significant bowel distension. There is diffuse edema throughout the mesenteric fat with a small amount of interloop fluid (notably on images 54, 69 and 72 of series 2). There is also moderate amount of free pelvic fluid. Vascular/Lymphatic: There are no enlarged abdominal or pelvic lymph nodes. Mildly prominent external iliac and inguinal lymph nodes bilaterally are similar to the previous study. The contrast bolus is suboptimal, but no acute vascular findings are demonstrated. There is mild aortoiliac atherosclerosis. Reproductive: The uterus and ovaries appear normal. No adnexal mass. Other: As above, there is diffuse edema throughout the mesenteric fat with a small amount of interloop fluid. There is no generalized ascites, drainable  fluid collection or free intraperitoneal air. No abdominal wall hernia. Musculoskeletal: No acute or significant osseous findings. IMPRESSION: 1.  New acute diffuse mesenteric inflammation with interloop and free pelvic fluid. There is a questionable loop of mid small bowel with associated wall thickening. Appearance could be secondary to vasculitis, peritonitis or acute enteritis. 2. The appendix and colon demonstrate no acute findings. There is mild sigmoid diverticulosis. 3. No drainable fluid collection, bowel obstruction or free air. 4. Mild aortic and branch vessel atherosclerosis without evidence of acute vascular occlusion. Electronically Signed: By: Richardean Sale M.D. On: 08/17/2017 18:10   Dg Abd Portable 2v  Result Date: 08/18/2017 CLINICAL DATA:  Abdominal pain EXAM: PORTABLE ABDOMEN - 2 VIEW COMPARISON:  08/17/2017 FINDINGS: Scattered large and small bowel gas is noted. Contrast material is noted within the bladder from recent CT examination. No free air is seen. No bony abnormality is noted. IMPRESSION: No acute abnormality seen. Electronically Signed   By: Inez Catalina M.D.   On: 08/18/2017 07:16    Anti-infectives: Anti-infectives (From admission, onward)   Start     Dose/Rate Route Frequency Ordered Stop   08/18/17 0000  piperacillin-tazobactam (ZOSYN) IVPB 3.375 g     3.375 g 12.5 mL/hr over 240 Minutes Intravenous Every 8 hours 08/17/17 2006     08/17/17 1630  piperacillin-tazobactam (ZOSYN) IVPB 4.5 g  Status:  Discontinued     4.5 g 200 mL/hr over 30 Minutes Intravenous  Once 08/17/17 1624 08/17/17 1628   08/17/17 1630  piperacillin-tazobactam (ZOSYN) IVPB 3.375 g     3.375 g 100 mL/hr over 30 Minutes Intravenous  Once 08/17/17 1628 08/17/17 1725      Assessment/Plan: s/p * No surgery found * Enteritis.  Continue conservative management. Allow clear liq today. IVF IV antibiotics. Continues slow improvement.        LOS: 2 days    Stark Klein 08/19/2017

## 2017-08-19 NOTE — Progress Notes (Signed)
PROGRESS NOTE    Misty [name]  EQA:[id number] DOB: July 11, 1980 DOA: 08/17/2017 PCP: Alphonzo Grieve, MD    Brief Narrative:  37 year old female who presented with abdominal pain.  She does have a significant past medical history for morbid obesity and type 2 diabetes mellitus.  Reported sudden epigastric abdominal pain associated with distention worse with no vomiting or diarrhea.  Recently treated for diverticulitis August 12, with a ciprofloxacin and metronidazole.  On her initial physical examination blood pressure 119/35, heart rate 126, respiratory rate 32, oxygen saturation 96%.  Mucous membranes, lungs clear to auscultation bilaterally, heart S1-S2 present, tachycardic, abdomen protuberant, distended, positive rebound and guarding.  No lower extremity edema.  139, potassium 3.2, chloride 105, bicarb 23, glucose 270, BUN 6, creatinine 0.60, white count 16.7, hemoglobin 13.0, hematocrit 39.6, platelets 319.  Urinalysis specific gravity greater than 1.046, white cells greater than 50, red cells 6-10.  CT of the abdomen with acute diffuse mesenteric inflammation with interloop and free pelvic fluid.  Questionable loop and mid small bowel wall thickening.  EKG sinus tachycardia, 135 beats per minutes, left axis deviation, normal intervals.  Patient was admitted to the hospital with a working diagnosis of peritonitis, possible acute enteritis.    Assessment & Plan:   Active Problems:   Diabetes type 2, uncontrolled ([place])   Tobacco use   Peritonitis (HCC)   Morbid obesity (HCC)   Sepsis (HCC)   Hypokalemia    1. Acute enteritis with peritonitis. Continue antibiotic therapy with IV Zosyn,  IV fluids, IV famotidine and as needed IV analgesics (hydromoprhine), antiemetics. Improved abdominal pain, positive flatus but no bowel movement, wbc continue trending down, diet advance to clears per surgery.   2. Hypokalemia due to dehydration and diarrhea.  Continue K correction with  oral KCl, will follow on renal panel in am, continue IV fluids with balanced electrolyte solution (lactate ringers). Avoid hypotension or nephrotoxic agents. Preserved renal function, with serum cr at 0.62.   3. T2DM with uncontrolled hyperglycemia. Diet has been advanced to clears, capillary glucose has remained stable despite poor oral intake, 151, 156, 114, 103, 114. Will continue sliding scale, hold on basal insulin to prevent hypoglycemia.     4. Obesity. BMI 36, follow up as outpatient.     DVT prophylaxis: enoxaparin   Code Status:  full Family Communication: no family at the bedside  Disposition Plan/ discharge barriers:  Pending clinical improvement    Consultants:   Surgery   Procedures:     Antimicrobials:   IV Zosyn     Subjective: Patient is feeling better, but still continue to have abdominal pain, improved with analgesics, no nausea or vomiting, positive flatus, no bowel movement.   Objective: Vitals:   08/19/17 0344 08/19/17 0626 08/19/17 0800 08/19/17 0827  BP:  (!) 153/89  134/74  Pulse:      Resp:  (!) 21  16  Temp: 98.9 F (37.2 C)  98.5 F (36.9 C)   TempSrc: Oral  Oral   SpO2:        Intake/Output Summary (Last 24 hours) at 08/19/2017 0840 Last data filed at 08/19/2017 0600 Gross per 24 hour  Intake 2885.12 ml  Output -  Net 2885.12 ml   There were no vitals filed for this visit.  Examination:   General: deconditioned  Neurology: Awake and alert, non focal  E ENT: mild pallor, no icterus, oral mucosa moist Cardiovascular: No JVD. S1-S2 present, rhythmic, no gallops, rubs, or murmurs. No lower extremity  edema. Pulmonary: vesicular breath sounds bilaterally, adequate air movement, no wheezing, rhonchi or rales. Gastrointestinal. Abdomen distended, tender to palpation no organomegaly, no rebound or guarding Skin. No rashes Musculoskeletal: no joint deformities     Data Reviewed: I have personally reviewed following labs and  imaging studies  CBC: Recent Labs  Lab 08/13/17 2009 08/17/17 1644 08/17/17 1705 08/18/17 0316 08/19/17 0325  WBC 14.0* 16.7*  --  13.8* 13.4*  NEUTROABS  --  14.1*  --   --  9.7*  HGB 12.9 13.0 13.9 11.8* 11.3*  HCT 40.2 39.6 41.0 36.9 35.9*  MCV 84.8 83.9  --  85.6 86.3  PLT 260 319  --  274 142   Basic Metabolic Panel: Recent Labs  Lab 08/13/17 2009 08/17/17 1644 08/17/17 1705 08/18/17 0316 08/18/17 1532 08/19/17 0325  NA 142 139 140 142  --  143  K 3.5 3.2* 3.2* 3.3* 3.4* 3.2*  CL 108 105 104 109  --  110  CO2 24 23  --  26  --  25  GLUCOSE 209* 270* 271* 213*  --  105*  BUN 6 6 4* 6  --  8  CREATININE 0.61 0.63 0.50 0.63  --  0.62  CALCIUM 8.9 8.5*  --  7.9*  --  8.0*  MG  --   --   --  2.0  --   --    GFR: Estimated Creatinine Clearance: 137.3 mL/min (by C-G formula based on SCr of 0.62 mg/dL). Liver Function Tests: Recent Labs  Lab 08/13/17 2009 08/17/17 1644 08/18/17 0316  AST 16 10* 8*  ALT 18 10 9   ALKPHOS 63 55 47  BILITOT 0.6 1.0 0.7  PROT 7.5 7.2 6.3*  ALBUMIN 3.8 3.3* 2.7*   Recent Labs  Lab 08/13/17 2009 08/17/17 1644  LIPASE 28 22   No results for input(s): AMMONIA in the last 168 hours. Coagulation Profile: No results for input(s): INR, PROTIME in the last 168 hours. Cardiac Enzymes: No results for input(s): CKTOTAL, CKMB, CKMBINDEX, TROPONINI in the last 168 hours. BNP (last 3 results) No results for input(s): PROBNP in the last 8760 hours. HbA1C: No results for input(s): HGBA1C in the last 72 hours. CBG: Recent Labs  Lab 08/18/17 0755 08/18/17 1211 08/18/17 1751 08/18/17 2112 08/19/17 0822  GLUCAP 151* 156* 114* 103* 114*   Lipid Profile: No results for input(s): CHOL, HDL, LDLCALC, TRIG, CHOLHDL, LDLDIRECT in the last 72 hours. Thyroid Function Tests: No results for input(s): TSH, T4TOTAL, FREET4, T3FREE, THYROIDAB in the last 72 hours. Anemia Panel: No results for input(s): VITAMINB12, FOLATE, FERRITIN, TIBC, IRON,  RETICCTPCT in the last 72 hours.    Radiology Studies: I have reviewed all of the imaging during this hospital visit personally     Scheduled Meds: . insulin aspart  0-15 Units Subcutaneous TID WC  . insulin aspart  0-5 Units Subcutaneous QHS   Continuous Infusions: . sodium chloride 250 mL (08/19/17 0824)  . famotidine (PEPCID) IV Stopped (08/18/17 3953)  . lactated ringers 100 mL/hr at 08/19/17 0015  . piperacillin-tazobactam (ZOSYN)  IV 3.375 g (08/19/17 0825)  . sodium chloride       LOS: 2 days        Tawni Millers, MD [facility] Pager [phone]

## 2017-08-19 NOTE — Progress Notes (Signed)
Patient refused potassium IV. Explained and educated.

## 2017-08-19 NOTE — Progress Notes (Signed)
Pt states she has had an emergency and must leave as soon as possible. States she can not  Wait for her iv antibiotic to finish infusing. Pt removed from monitor, IV removed, AMA papers signed, Arby Barrette NP notified. Patient given blue scrubs and she left with two family members. Hoyle Barr, RN-C

## 2017-08-19 NOTE — Progress Notes (Signed)
Nutrition Brief Note  Patient identified on the Malnutrition Screening Tool (MST) Report  Patient with weight loss but not significant for time frame. Pt currently on clears, if unable to further advance diet, will monitor for needs.  Wt Readings from Last 15 Encounters:  08/15/17 114.9 kg  08/13/17 117.9 kg  02/28/17 120.4 kg  05/16/16 117.4 kg  06/03/15 117 kg  09/04/14 122.6 kg  08/12/14 122.4 kg  04/22/13 126.6 kg  02/04/13 126.6 kg  09/16/12 122.7 kg  06/03/12 122 kg  04/12/12 119.6 kg  12/11/11 131.5 kg  12/08/11 131.5 kg  12/03/11 130.6 kg   BMI: 33.4 kg/m^2. Patient meets criteria for obesity based on current BMI.   Current diet order is clear liquids. Labs and medications reviewed.   No nutrition interventions warranted at this time. If nutrition issues arise, please consult RD.   Clayton Bibles, MS, RD, [facility] Pager: [phone] After Hours Pager: [phone]

## 2017-08-20 NOTE — Discharge Summary (Signed)
Physician Discharge Summary  MAHEEN CWIKLA BMW:[id number] DOB: [birth year] DOA: 08/17/2017  PCP: Alphonzo Grieve, MD  Admit date: 08/17/2017 Discharge date: 08/20/2017  Admitted From: Home  Disposition:  Home   Recommendations for Outpatient Follow-up and new medication changes:   Patient left the hospital against medical advice.   Brief/Interim Summary: 37 year old female who presented with abdominal pain. She does have a significant past medical history for morbid obesity andtype 2 diabetes mellitus.Reported sudden epigastric abdominal pain associated with distention worse movement, no vomiting or diarrhea. Recently treated for diverticulitis August 12,with a ciprofloxacin and metronidazole. On her initial physical examination blood pressure 119/35, heart rate 126, respiratory rate32, oxygen saturation 96%.Mucous membranes, lungs clear to auscultation bilaterally, heart S1-S2 present, tachycardic, abdomen protuberant, distended, positive rebound and guarding. No lower extremity edema. Sodium139, potassium 3.2, chloride 105, bicarb 23, glucose 270, BUN 6, creatinine 0.60, white count 16.7, hemoglobin 13.0, hematocrit 39.6, platelets 319.Urinalysis specific gravity greater than 1.046, white cells greater than 50, red cells 6-10. CT of the abdomen with acute diffuse mesenteric inflammation with interloop and free pelvic fluid. Questionableloop and mid small bowel wall thickening. EKG sinus tachycardia, 135 beats per minutes, left axis deviation, normal intervals.  Patient was admitted to the hospital with a working diagnosis of peritonitis,possible acute enteritis.  Acute peritonitis with enteritis.  Patient was admitted to the stepdown unit, she was placed on IV fluids, IV antibiotics with IV Zosyn, IV antiacids, and as needed IV analgesics and antiemetics.  She was seen by surgical service with recommendations to continue conservative medical therapy.  She had  improvement of her symptoms, and her diet was advanced to clear liquids.  On August 18 she decided to leave the hospital [place], without completing her recommended medical therapy.;   Discharge Diagnoses:  Principal Problem:   Regional enteritis [facility]) Active Problems:   Diabetes type 2, uncontrolled ([place])   Tobacco use   Morbid obesity ([place])   Sepsis ([place])   Hypokalemia    Discharge Instructions   Allergies as of 08/19/2017   No Known Allergies     Medication List    ASK your doctor about these medications   acetaminophen 500 MG tablet Commonly known as:  TYLENOL Take 1,500 mg by mouth every 6 (six) hours as needed (pain.).   ciprofloxacin 500 MG tablet Commonly known as:  CIPRO Take 1 tablet (500 mg total) by mouth 2 (two) times daily. One po bid x 10 days   cyclobenzaprine 5 MG tablet Commonly known as:  FLEXERIL Take 5 mg by mouth 3 (three) times daily.   HYDROcodone-acetaminophen 5-325 MG tablet Commonly known as:  NORCO/VICODIN Take 1 tablet by mouth every 4 (four) hours as needed.   ibuprofen 200 MG tablet Commonly known as:  ADVIL,MOTRIN Take 400 mg by mouth every 6 (six) hours as needed (pain.).   metroNIDAZOLE 500 MG tablet Commonly known as:  FLAGYL Take 1 tablet (500 mg total) by mouth 2 (two) times daily. One po bid x 10 days       No Known Allergies  Consultations:  Surgery    Procedures/Studies: Ct Abdomen Pelvis W Contrast  Addendum Date: 08/17/2017   ADDENDUM REPORT: 08/17/2017 19:18 ADDENDUM: These results were discussed by telephone on 08/17/2017 at 7:17 pm to Dr. Lennice Sites , who verbally acknowledged these results. Surgical consultation was recommended. Electronically Signed   By: Richardean Sale M.D.   On: 08/17/2017 19:18   Result Date: 08/17/2017 CLINICAL DATA:  Seven day history  of diffuse abdominal pain with some nausea. Concern for perforation. EXAM: CT ABDOMEN AND PELVIS WITH CONTRAST TECHNIQUE: Multidetector  CT imaging of the abdomen and pelvis was performed using the standard protocol following bolus administration of intravenous contrast. CONTRAST:  170m ISOVUE-300 IOPAMIDOL (ISOVUE-300) INJECTION 61% COMPARISON:  CT 08/13/2017 FINDINGS: Lower chest: Mild mosaic attenuation in both lung bases, likely atelectasis. No focal airspace disease, pleural or pericardial effusion. Hepatobiliary: Subjective hepatic steatosis without focal abnormality. Probable sludge in the gallbladder lumen. No calcified gallstone, wall thickening or biliary dilatation. Pancreas: Unremarkable. No pancreatic ductal dilatation or surrounding inflammatory changes. Spleen: Normal in size without focal abnormality. Adrenals/Urinary Tract: Both adrenal glands appear normal. The kidneys appear normal without evidence of urinary tract calculus, suspicious lesion or hydronephrosis. No bladder abnormalities are seen. Stomach/Bowel: The stomach and proximal small bowel appear normal. There is questionable mild small bowel wall thickening in the left mid abdomen (images 55-61/2). The appendix and colon appear stable with mild sigmoid diverticulosis, but no focal associated inflammation. There is no significant bowel distension. There is diffuse edema throughout the mesenteric fat with a small amount of interloop fluid (notably on images 54, 69 and 72 of series 2). There is also moderate amount of free pelvic fluid. Vascular/Lymphatic: There are no enlarged abdominal or pelvic lymph nodes. Mildly prominent external iliac and inguinal lymph nodes bilaterally are similar to the previous study. The contrast bolus is suboptimal, but no acute vascular findings are demonstrated. There is mild aortoiliac atherosclerosis. Reproductive: The uterus and ovaries appear normal. No adnexal mass. Other: As above, there is diffuse edema throughout the mesenteric fat with a small amount of interloop fluid. There is no generalized ascites, drainable fluid collection or  free intraperitoneal air. No abdominal wall hernia. Musculoskeletal: No acute or significant osseous findings. IMPRESSION: 1. New acute diffuse mesenteric inflammation with interloop and free pelvic fluid. There is a questionable loop of mid small bowel with associated wall thickening. Appearance could be secondary to vasculitis, peritonitis or acute enteritis. 2. The appendix and colon demonstrate no acute findings. There is mild sigmoid diverticulosis. 3. No drainable fluid collection, bowel obstruction or free air. 4. Mild aortic and branch vessel atherosclerosis without evidence of acute vascular occlusion. Electronically Signed: By: WRichardean SaleM.D. On: 08/17/2017 18:10   Ct Abdomen Pelvis W Contrast  Result Date: 08/13/2017 CLINICAL DATA:  Abdominal pain since 11 o'clock this morning. EXAM: CT ABDOMEN AND PELVIS WITH CONTRAST TECHNIQUE: Multidetector CT imaging of the abdomen and pelvis was performed using the standard protocol following bolus administration of intravenous contrast. CONTRAST:  1066mISOVUE-300 IOPAMIDOL (ISOVUE-300) INJECTION 61% COMPARISON:  None. FINDINGS: Lower chest: Mild dependent changes in the lung bases. Hepatobiliary: Mild diffuse fatty infiltration of the liver. No focal liver lesions identified. Gallbladder and bile ducts are unremarkable. Pancreas: Unremarkable. No pancreatic ductal dilatation or surrounding inflammatory changes. Spleen: Normal in size without focal abnormality. Adrenals/Urinary Tract: Adrenal glands are unremarkable. Kidneys are normal, without renal calculi, focal lesion, or hydronephrosis. Bladder is unremarkable. Stomach/Bowel: Stomach, small bowel, and colon are not abnormally distended. Scattered diverticula in the sigmoid colon. There is suggestion of mild infiltration in the fat around the junction of the rectosigmoid colon with mild free fluid. This likely indicates early acute diverticulitis. No abscess identified. Appendix is normal.  Vascular/Lymphatic: Aortic atherosclerosis. No enlarged abdominal or pelvic lymph nodes. Reproductive: Uterus and bilateral adnexa are unremarkable. Other: No abdominal wall hernia or abnormality. No abdominopelvic ascites. Musculoskeletal: No acute or significant osseous findings. IMPRESSION:  Mild infiltration in the fat around the junction of the rectosigmoid colon consistent with early acute diverticulitis. No abscess. Diffuse fatty infiltration of the liver. Electronically Signed   By: Lucienne Capers M.D.   On: 08/13/2017 21:28   Dg Abd Portable 2v  Result Date: 08/18/2017 CLINICAL DATA:  Abdominal pain EXAM: PORTABLE ABDOMEN - 2 VIEW COMPARISON:  08/17/2017 FINDINGS: Scattered large and small bowel gas is noted. Contrast material is noted within the bladder from recent CT examination. No free air is seen. No bony abnormality is noted. IMPRESSION: No acute abnormality seen. Electronically Signed   By: Inez Catalina M.D.   On: 08/18/2017 07:16       Subjective: NA  Discharge Exam: Vitals:   08/19/17 1608 08/19/17 2000  BP: (!) 132/92 (!) 140/122  Pulse: 87 (!) 33  Resp: (!) 22 (!) 22  Temp:    SpO2: 100% 97%   Vitals:   08/19/17 1419 08/19/17 1600 08/19/17 1608 08/19/17 2000  BP: 126/85  (!) 132/92 (!) 140/122  Pulse: 88 78 87 (!) 33  Resp: (!) 25 (!) 21 (!) 22 (!) 22  Temp: 98.5 F (36.9 C) 98.1 F (36.7 C)    TempSrc: Oral Oral    SpO2:  98% 100% 97%  Weight: 114.3 kg     Height: 6' 1"  (1.854 m)       NA   The results of significant diagnostics from this hospitalization (including imaging, microbiology, ancillary and laboratory) are listed below for reference.     Microbiology: Recent Results (from the past 240 hour(s))  Blood culture (routine x 2)     Status: None (Preliminary result)   Collection Time: 08/17/17  4:44 PM  Result Value Ref Range Status   Specimen Description   Final    BLOOD LEFT ANTECUBITAL Performed at [facility]  [address] [place], [place] [address]    Special Requests   Final    BOTTLES DRAWN AEROBIC AND ANAEROBIC Blood Culture adequate volume Performed at [facility] [address], [place], [place] [address]    Culture   Final    NO GROWTH 3 DAYS Performed at [facility], [place] [address] [place], [place] [address]    Report Status PENDING  Incomplete  Blood culture (routine x 2)     Status: None (Preliminary result)   Collection Time: 08/17/17  4:44 PM  Result Value Ref Range Status   Specimen Description   Final    BLOOD BLOOD RIGHT HAND Performed at [facility] [address], [place], [place] [address]    Special Requests   Final    BOTTLES DRAWN AEROBIC AND ANAEROBIC Blood Culture adequate volume Performed at [facility] [address] [place], [place] [address]    Culture   Final    NO GROWTH 3 DAYS Performed at [facility], [place] [address], [place], [place] [address]    Report Status PENDING  Incomplete  MRSA PCR Screening     Status: None   Collection Time: 08/17/17 10:11 PM  Result Value Ref Range Status   MRSA by PCR NEGATIVE NEGATIVE Final    Comment:        The GeneXpert MRSA Assay (FDA approved for NASAL specimens only), is one component of a comprehensive MRSA colonization surveillance program. It is not intended to diagnose MRSA infection nor to guide or monitor treatment for MRSA infections. Performed at [facility], [place] [address] [place], [place] [address]   C difficile quick scan w PCR reflex     Status: None   Collection Time: 08/19/17 12:30 PM  Result Value Ref Range Status   C Diff antigen NEGATIVE NEGATIVE Final   C Diff toxin NEGATIVE NEGATIVE Final   C Diff interpretation No C. difficile detected.  Final    Comment: Performed at [facility], [place] [address] [place],  [address]     Labs: BNP (last 3 results) No results for  input(s): BNP in the last 8760 hours. Basic Metabolic Panel: Recent Labs  Lab 08/13/17 2009 08/17/17 1644 08/17/17 1705 08/18/17 0316 08/18/17 1532 08/19/17 0325  NA 142 139 140 142  --  143  K 3.5 3.2* 3.2* 3.3* 3.4* 3.2*  CL 108 105 104 109  --  110  CO2 24 23  --  26  --  25  GLUCOSE 209* 270* 271* 213*  --  105*  BUN 6 6 4* 6  --  8  CREATININE 0.61 0.63 0.50 0.63  --  0.62  CALCIUM 8.9 8.5*  --  7.9*  --  8.0*  MG  --   --   --  2.0  --   --    Liver Function Tests: Recent Labs  Lab 08/13/17 2009 08/17/17 1644 08/18/17 0316  AST 16 10* 8*  ALT 18 10 9   ALKPHOS 63 55 47  BILITOT 0.6 1.0 0.7  PROT 7.5 7.2 6.3*  ALBUMIN 3.8 3.3* 2.7*   Recent Labs  Lab 08/13/17 2009 08/17/17 1644  LIPASE 28 22   No results for input(s): AMMONIA in the last 168 hours. CBC: Recent Labs  Lab 08/13/17 2009 08/17/17 1644 08/17/17 1705 08/18/17 0316 08/19/17 0325  WBC 14.0* 16.7*  --  13.8* 13.4*  NEUTROABS  --  14.1*  --   --  9.7*  HGB 12.9 13.0 13.9 11.8* 11.3*  HCT 40.2 39.6 41.0 36.9 35.9*  MCV 84.8 83.9  --  85.6 86.3  PLT 260 319  --  274 270   Cardiac Enzymes: No results for input(s): CKTOTAL, CKMB, CKMBINDEX, TROPONINI in the last 168 hours. BNP: Invalid input(s): POCBNP CBG: Recent Labs  Lab 08/18/17 1751 08/18/17 2112 08/19/17 0822 08/19/17 1227 08/19/17 1632  GLUCAP 114* 103* 114* 178* 181*   D-Dimer No results for input(s): DDIMER in the last 72 hours. Hgb A1c No results for input(s): HGBA1C in the last 72 hours. Lipid Profile No results for input(s): CHOL, HDL, LDLCALC, TRIG, CHOLHDL, LDLDIRECT in the last 72 hours. Thyroid function studies No results for input(s): TSH, T4TOTAL, T3FREE, THYROIDAB in the last 72 hours.  Invalid input(s): FREET3 Anemia work up No results for input(s): VITAMINB12, FOLATE, FERRITIN, TIBC, IRON, RETICCTPCT in the last 72 hours. Urinalysis    Component Value Date/Time   COLORURINE YELLOW 08/17/2017 [place] 08/17/2017 1644   LABSPEC >1.046 (H) 08/17/2017 1644   PHURINE 6.0 08/17/2017 1644   GLUCOSEU 150 (A) 08/17/2017 1644   GLUCOSEU NEG mg/dL 07/13/2006 2351   HGBUR MODERATE (A) 08/17/2017 1644   BILIRUBINUR NEGATIVE 08/17/2017 1644   BILIRUBINUR Negative 08/12/2014 1551   KETONESUR NEGATIVE 08/17/2017 1644   PROTEINUR NEGATIVE 08/17/2017 1644   UROBILINOGEN 1.0 08/12/2014 1551   UROBILINOGEN 1.0 04/22/2013 1148   NITRITE NEGATIVE 08/17/2017 1644   LEUKOCYTESUR LARGE (A) 08/17/2017 1644   Sepsis Labs Invalid input(s): PROCALCITONIN,  WBC,  LACTICIDVEN Microbiology Recent Results (from  the past 240 hour(s))  Blood culture (routine x 2)     Status: None (Preliminary result)   Collection Time: 08/17/17  4:44 PM  Result Value Ref Range Status   Specimen Description   Final    BLOOD LEFT ANTECUBITAL Performed at [facility] [address] [place], [place] [address]    Special Requests   Final    BOTTLES DRAWN AEROBIC AND ANAEROBIC Blood Culture adequate volume Performed at [facility] [address] [place], [place] [address]    Culture   Final    NO GROWTH 3 DAYS Performed at [facility], [place] [address], [place], [place] [address]    Report Status PENDING  Incomplete  Blood culture (routine x 2)     Status: None (Preliminary result)   Collection Time: 08/17/17  4:44 PM  Result Value Ref Range Status   Specimen Description   Final    BLOOD BLOOD RIGHT HAND Performed at [facility] [address], [place], [place] [address]    Special Requests   Final    BOTTLES DRAWN AEROBIC AND ANAEROBIC Blood Culture adequate volume Performed at [facility] [address], [place], [place] [address]    Culture   Final    NO GROWTH 3 DAYS Performed at [facility], [place] [address], [place], [place] [address]    Report Status PENDING  Incomplete  MRSA PCR Screening      Status: None   Collection Time: 08/17/17 10:11 PM  Result Value Ref Range Status   MRSA by PCR NEGATIVE NEGATIVE Final    Comment:        The GeneXpert MRSA Assay (FDA approved for NASAL specimens only), is one component of a comprehensive MRSA colonization surveillance program. It is not intended to diagnose MRSA infection nor to guide or monitor treatment for MRSA infections. Performed at [facility], [place] [address], [place], [place] [address]   C difficile quick scan w PCR reflex     Status: None   Collection Time: 08/19/17 12:30 PM  Result Value Ref Range Status   C Diff antigen NEGATIVE NEGATIVE Final   C Diff toxin NEGATIVE NEGATIVE Final   C Diff interpretation No C. difficile detected.  Final    Comment: Performed at [facility], [place] [address] [place],  [address]     Time coordinating discharge: 45 minutes  SIGNED:   Tawni Millers, MD  [facility] 08/20/2017, 4:33 PM Pager [phone]  If 7PM-7AM, please contact night-coverage [email or web address] Password [email or web address]

## 2017-08-22 LAB — CULTURE, BLOOD (ROUTINE X 2)
Culture: NO GROWTH
Culture: NO GROWTH
SPECIAL REQUESTS: ADEQUATE
Special Requests: ADEQUATE

## 2017-08-31 ENCOUNTER — Encounter: Payer: Self-pay | Admitting: Dietician

## 2017-08-31 ENCOUNTER — Telehealth: Payer: Self-pay | Admitting: Internal Medicine

## 2017-08-31 NOTE — Telephone Encounter (Signed)
Called patient in reference to an Out of Work and Out of School note for her and her Sister Misty [name].  Spoke with Dr Eileen Stanford who provided an out school/work note to cover the patient until 08/20/2017.  Explained to the pt that the [facility] will not be able to provide another letter unless she makes an appointment to be seen since she re-visited the [facility] on 08/17/2017 and left AMA.  Patient verbalized understanding and hung up the phone.

## 2017-09-13 ENCOUNTER — Encounter: Payer: Self-pay | Admitting: Internal Medicine

## 2017-09-13 ENCOUNTER — Ambulatory Visit: Payer: Medicaid Other

## 2017-09-20 ENCOUNTER — Emergency Department (HOSPITAL_COMMUNITY): Payer: Self-pay

## 2017-09-20 ENCOUNTER — Emergency Department (HOSPITAL_COMMUNITY)
Admission: EM | Admit: 2017-09-20 | Discharge: 2017-09-20 | Disposition: A | Discharge status: Home / Self Care | Payer: Self-pay | Attending: Emergency Medicine | Admitting: Emergency Medicine

## 2017-09-20 ENCOUNTER — Encounter (HOSPITAL_COMMUNITY): Payer: Self-pay

## 2017-09-20 DIAGNOSIS — F17210 Nicotine dependence, cigarettes, uncomplicated: Secondary | ICD-10-CM | POA: Insufficient documentation

## 2017-09-20 DIAGNOSIS — E119 Type 2 diabetes mellitus without complications: Secondary | ICD-10-CM | POA: Insufficient documentation

## 2017-09-20 DIAGNOSIS — J40 Bronchitis, not specified as acute or chronic: Secondary | ICD-10-CM | POA: Insufficient documentation

## 2017-09-20 DIAGNOSIS — Z79899 Other long term (current) drug therapy: Secondary | ICD-10-CM | POA: Insufficient documentation

## 2017-09-20 MED ORDER — BENZONATATE 100 MG PO CAPS: 100.0000 mg | ORAL_CAPSULE | Freq: Three times a day (TID) | ORAL | 0 refills | Status: DC | PRN

## 2017-09-20 MED ORDER — ALBUTEROL SULFATE HFA 108 (90 BASE) MCG/ACT IN AERS
2.0000 | INHALATION_SPRAY | Freq: Once | RESPIRATORY_TRACT | Status: AC
  Administered 2017-09-20: 2 via RESPIRATORY_TRACT
  Filled 2017-09-20: qty 6.7

## 2017-09-20 NOTE — ED Triage Notes (Signed)
Pt presents with c/o cold-like symptoms. Pt reports that she has body aches for about 2 weeks. Pt reports she has had a "cold" for about one month.

## 2017-09-20 NOTE — ED Provider Notes (Signed)
[facility] DEPT Provider Note   CSN: [id number] Arrival date & time: 09/20/17  1709     History   Chief Complaint Chief Complaint  Patient presents with  . Generalized Body Aches    HPI Misty [name] is a 37 y.o. female with past medical history of type 2 diabetes, presenting to the ED with complaints of cold-like symptoms that have been ongoing for about 1 month now.  She states she has cough that feels productive though she is unable to get any sputum up.  Reports body aches, intermittent runny nose.  Denies ear pain or sore throat.  Denies fever.  Denies difficulty breathing or swallowing.  The history is provided by the patient.    Past Medical History:  Diagnosis Date  . Chest pain    multiple ED evaluations, most likely musculoskeletal (cervical radiculopathy), needs MRI spine  . Diabetes mellitus   . GERD (gastroesophageal reflux disease)   . Gestational diabetes   . PCOD (polycystic ovarian disease)    Followed by [facility], has been on metformin    Patient Active Problem List   Diagnosis Date Noted  . 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[place]) 08/17/2017  . Morbid obesity ([place]) 08/17/2017  . Sepsis ([place]) 08/17/2017  . Hypokalemia 08/17/2017  . Diffuse abdominal pain 08/15/2017  . Headache 02/28/2017  . Heart palpitations 06/04/2012  . Tobacco use 06/03/2012  . Preventative health care 05/03/2011  . Diabetes type 2, uncontrolled ([place]) 02/24/2008  . Polycystic ovarian disease 07/27/2006  . GERD 03/02/2006    Past Surgical History:  Procedure Laterality Date  . CESAREAN SECTION  12/14/2011   Procedure: CESAREAN SECTION;  Surgeon: Lahoma Crocker, MD;  Location: [facility];  Service: Obstetrics;  Laterality: N/A;  Primary Cesarean Section Delivery Girl @ 0616, Apgars     OB History    Gravida  1   Para  1   Term  1   Preterm  0   AB  0   Living  1     SAB  0   TAB  0   Ectopic  0   Multiple  0   Live Births  1            Home Medications    Prior to Admission medications   Medication Sig Start Date End Date Taking? Authorizing Provider  acetaminophen (TYLENOL) 500 MG tablet Take 1,500 mg by mouth every 6 (six) hours as needed (pain.).    [provider]  benzonatate (TESSALON) 100 MG capsule Take 1 capsule (100 mg total) by mouth 3 (three) times daily as needed for cough. 09/20/17   Robinson, [place] N, PA-C  ciprofloxacin (CIPRO) 500 MG tablet Take 1 tablet (500 mg total) by mouth 2 (two) times daily. One po bid x 10 days 08/13/17   Daleen Bo, MD  cyclobenzaprine (FLEXERIL) 5 MG tablet Take 5 mg by mouth 3 (three) times daily.    [provider]  HYDROcodone-acetaminophen (NORCO) 5-325 MG tablet Take 1 tablet by mouth every 4 (four) hours as needed. 08/13/17   Daleen Bo, MD  ibuprofen (ADVIL,MOTRIN) 200 MG tablet Take 400 mg by mouth every 6 (six) hours as needed (pain.).    [provider]  metroNIDAZOLE (FLAGYL) 500 MG tablet Take 1 tablet (500 mg total) by mouth 2 (two) times daily. One po bid x 10 days 08/13/17   Daleen Bo, MD    Family History Family History  Problem Relation Age of Onset  . Diabetes  Mother   . Hypertension Mother   . Stomach cancer Mother        diet at 70  . Heart disease Father        diet at 38  . Diabetes Sister   . Hypertension Sister   . Diabetes Brother   . Cancer Maternal Grandmother   . Cancer Paternal Grandmother     Social History Social History   Tobacco Use  . Smoking status: Current Every Day Smoker    Packs/day: 0.50    Types: Cigarettes  . Smokeless tobacco: Never Used  . Tobacco comment: 0.5ppd  Substance Use Topics  . Alcohol use: No    Alcohol/week: 0.0 standard drinks  . Drug use: No     Allergies   Patient has no known allergies.   Review of Systems Review of Systems  Constitutional: Negative for fever.  HENT: Positive for rhinorrhea. Negative for ear pain and sore throat.   Respiratory:  Positive for cough and chest tightness. Negative for shortness of breath.   Musculoskeletal: Positive for myalgias (Generalized).     Physical Exam Updated Vital Signs BP (!) 157/87 (BP Location: Left Arm)   Pulse (!) 102   Temp 99.2 F (37.3 C) (Oral)   Resp 16   Ht 6' 1"  (1.854 m)   Wt 117 kg   LMP 09/13/2017 (Approximate)   SpO2 98%   BMI 34.04 kg/m   Physical Exam  Constitutional: She appears well-developed and well-nourished. No distress.  HENT:  Head: Normocephalic and atraumatic.  Right Ear: Tympanic membrane and ear canal normal.  Left Ear: Tympanic membrane and ear canal normal.  Mouth/Throat: Uvula is midline and oropharynx is clear and moist. No trismus in the jaw. No uvula swelling.  Tolerating secretions  Eyes: Conjunctivae are normal.  Neck: Normal range of motion. Neck supple.  Cardiovascular: Regular rhythm and normal heart sounds.  Slightly tachycardic  Pulmonary/Chest: Effort normal. No stridor. No respiratory distress. She has wheezes (Very faint wheezes). She has no rales.  Lymphadenopathy:    She has no cervical adenopathy.  Psychiatric: She has a normal mood and affect. Her behavior is normal.  Nursing note and vitals reviewed.    ED Treatments / Results  Labs (all labs ordered are listed, but only abnormal results are displayed) Labs Reviewed - No data to display  EKG None  Radiology Dg Chest 2 View  Result Date: 09/20/2017 CLINICAL DATA:  Cough EXAM: CHEST - 2 VIEW COMPARISON:  06/03/2015 FINDINGS: The heart size and mediastinal contours are within normal limits. Both lungs are clear. The visualized skeletal structures are unremarkable. IMPRESSION: No active cardiopulmonary disease. Electronically Signed   By: Ashley Royalty M.D.   On: 09/20/2017 19:02    Procedures Procedures (including critical care time)  Medications Ordered in ED Medications  albuterol (PROVENTIL HFA;VENTOLIN HFA) 108 (90 Base) MCG/ACT inhaler 2 puff (2 puffs  Inhalation Given 09/20/17 1945)     Initial Impression / Assessment and Plan / ED Course  I have reviewed the triage vital signs and the nursing notes.  Pertinent labs & imaging results that were available during my care of the patient were reviewed by me and considered in my medical decision making (see chart for details).     Patient with persistent cough and mild upper respiratory symptoms.  On exam she is afebrile O2 saturation 98% on room air.  Lungs with very faint wheezes though no respiratory distress.  Tolerating secretions.  Chest x-ray is negative for  pneumonia.  Suspect viral bronchitis.  Patient will be discharged with inhaler as well and antitussives.  She is instructed to follow-up with her primary care.  Safe for discharge at this time.  Discussed results, findings, treatment and follow up. Patient advised of return precautions. Patient verbalized understanding and agreed with plan.   Final Clinical Impressions(s) / ED Diagnoses   Final diagnoses:  Bronchitis    ED Discharge Orders         Ordered    benzonatate (TESSALON) 100 MG capsule  3 times daily PRN     09/20/17 1919           Robinson, [place] N, PA-C 09/20/17 Elmer Picker, MD 09/20/17 [phone]

## 2017-09-20 NOTE — Discharge Instructions (Signed)
Please read instructions below.  You can take tylenol or ibuprofen as needed for body aches or fever.  Drink plenty of water.  Use saline nasal spray for congestion. You can take tessalon every 8 hours as needed for cough. You can use the inhaler every 6 hours as needed for chest tightness. Follow up with your primary care provider . Return to the ER for inability to swallow liquids, difficulty breathing, or new or worsening symptoms.

## 2017-10-03 ENCOUNTER — Ambulatory Visit: Payer: Medicaid Other | Admitting: Internal Medicine

## 2017-10-03 ENCOUNTER — Encounter: Payer: Self-pay | Admitting: Gastroenterology

## 2017-10-03 ENCOUNTER — Encounter: Payer: Self-pay | Admitting: Internal Medicine

## 2017-10-03 ENCOUNTER — Other Ambulatory Visit: Payer: Self-pay

## 2017-10-03 VITALS — BP 130/55 | HR 87 | Temp 98.4°F | Ht 73.0 in | Wt 260.3 lb

## 2017-10-03 DIAGNOSIS — K921 Melena: Secondary | ICD-10-CM

## 2017-10-03 DIAGNOSIS — E118 Type 2 diabetes mellitus with unspecified complications: Secondary | ICD-10-CM

## 2017-10-03 DIAGNOSIS — Z7984 Long term (current) use of oral hypoglycemic drugs: Secondary | ICD-10-CM

## 2017-10-03 DIAGNOSIS — G8929 Other chronic pain: Secondary | ICD-10-CM

## 2017-10-03 DIAGNOSIS — Z8719 Personal history of other diseases of the digestive system: Secondary | ICD-10-CM

## 2017-10-03 DIAGNOSIS — E1165 Type 2 diabetes mellitus with hyperglycemia: Secondary | ICD-10-CM

## 2017-10-03 DIAGNOSIS — K5909 Other constipation: Secondary | ICD-10-CM

## 2017-10-03 DIAGNOSIS — K5900 Constipation, unspecified: Secondary | ICD-10-CM | POA: Insufficient documentation

## 2017-10-03 DIAGNOSIS — K5000 Crohn's disease of small intestine without complications: Secondary | ICD-10-CM

## 2017-10-03 HISTORY — DX: Personal history of other diseases of the digestive system: Z87.19

## 2017-10-03 LAB — GLUCOSE, CAPILLARY: GLUCOSE-CAPILLARY: 167 mg/dL — AB (ref 70–99)

## 2017-10-03 LAB — POCT GLYCOSYLATED HEMOGLOBIN (HGB A1C): Hemoglobin A1C: 7.8 % — AB (ref 4.0–5.6)

## 2017-10-03 MED ORDER — LIRAGLUTIDE 18 MG/3ML ~~LOC~~ SOPN: 0.6000 mg | PEN_INJECTOR | SUBCUTANEOUS | 0 refills | Status: DC

## 2017-10-03 MED ORDER — LUBIPROSTONE 8 MCG PO CAPS: 8.0000 ug | ORAL_CAPSULE | Freq: Two times a day (BID) | ORAL | 1 refills | Status: DC

## 2017-10-03 NOTE — Assessment & Plan Note (Signed)
Patient was seen in the ED in August of 2019 with acute diverticulitis. She was discharged on a prescription of cipro and flagyl. She presented again to the ED 4 days later with worsening abdominal pain, fever, and tachycardia. Repeat CT abdomen pelvis demonstrated new acute diffuse mesenteric inflammation with interloop and free pelvic fluid. There was a questionable loop of mid small bowel with associated wall thickening, concerning for secondary to vasculitis, peritonitis or acute enteritis. She was admitted to the hospital and treated conservatively with IV antibiotics, IV fluids, and bowel rest. Unfortunately patient had to leave AMA due to family obligations. Today, she denies any recurrent fevers. Pain has improved. She is tolerating PO without nausea and vomiting. She does have abdominal discomfort associated with her chronic constipation but overall feels she has improved. She reports multiple bloody bowel movements after leaving the hospital, but this has resolved. Unclear reason for small bowel enteritis. Patient has a family history of lupus in her father, but has no other findings concerning for autoimmune etiology. Her mother is deceased from stomach cancer. Given her severe chronic constipation, recent acute diverticulitis, and small bowel enteritis, patient would benefit from GI consultation and evaluation with endoscopy.  -- GI referral

## 2017-10-03 NOTE — Assessment & Plan Note (Signed)
>>  ASSESSMENT AND PLAN FOR DIABETES TYPE 2, UNCONTROLLED WRITTEN ON 10/03/2017  1:04 PM BY Reymundo Poll, MD  Patient has a history of uncontrolled type II diabetes. She is unable to tolerate metformin due to GI side effects. Was previously on victoza but reports this was discontinued by another provider. Hemoglobin A1c today is 7.8. She is agreeable to restarting medications.  -- Restart Victoza 0.6 mg qweeksly; increase to 1.2 mg at follow up -- Follow up PCP 3 months

## 2017-10-03 NOTE — Assessment & Plan Note (Signed)
Patient has a history of uncontrolled type II diabetes. She is unable to tolerate metformin due to GI side effects. Was previously on victoza but reports this was discontinued by another provider. Hemoglobin A1c today is 7.8. She is agreeable to restarting medications.  -- Restart Victoza 0.6 mg qweeksly; increase to 1.2 mg at follow up -- Follow up PCP 3 months

## 2017-10-03 NOTE — Patient Instructions (Addendum)
[name],  It was a pleasure to see you today. I am sorry to hear about your stomach pain. I have sent a prescription for a constipation medicine to your pharmacy. Please take this twice a day as prescribed. I have also referred you to the gastroenterologist. You will be contacted to schedule this appointment. Please follow up with [place] again in 4 weeks. If you have any questions or concerns, call our clinic at [phone] or after hours call [phone] and ask for the internal medicine resident on call.  Thank you!  Dr. Philipp Ovens

## 2017-10-03 NOTE — Progress Notes (Signed)
   CC: Constipation, abdominal pain  HPI:  [name] is a 37 y.o. female with past medical history outlined below here for follow up of constipation and abdominal pain. For the details of today's visit, please refer to the assessment and plan.   Past Medical History:  Diagnosis Date  . Chest pain    multiple ED evaluations, most likely musculoskeletal (cervical radiculopathy), needs MRI spine  . Diabetes mellitus   . GERD (gastroesophageal reflux disease)   . Gestational diabetes   . PCOD (polycystic ovarian disease)    Followed by [facility], has been on metformin    Review of Systems  Constitutional: Negative for chills and fever.  Gastrointestinal: Positive for abdominal pain, blood in stool and constipation. Negative for nausea and vomiting.    Physical Exam:  Vitals:   10/03/17 0904  BP: (!) 130/55  Pulse: 87  Temp: 98.4 F (36.9 C)  TempSrc: Oral  Weight: 260 lb 4.8 oz (118.1 kg)    Constitutional: NAD, appears comfortable Cardiovascular: RRR, no murmurs, rubs, or gallops.  Pulmonary/Chest: CTAB, no wheezes, rales, or rhonchi.  Abdominal: Soft, non tender, non distended. +BS.  Extremities: Warm and well perfused. No edema.  Psychiatric: Normal mood and affect  Assessment & Plan:   See Encounters Tab for problem based charting.  Patient discussed with Dr. Eppie Gibson

## 2017-10-03 NOTE — Assessment & Plan Note (Signed)
Patient reports severe constipation that has been on going for years. She normally has a bowel movement once a week or less. She has associated abdominal pain and discomfort. She has been prescribed lactulose in the past, and recently has been taking a friend's prescription lactulose without relief. Unclear how much she is taking. Says she drinks it directly out of the bottle. She was recently seen in the ED for acute diverticulitis, and hospitalized four days later with fever and small bowel enteritis. Unclear if the later could be related to her chronic constipation. There is some concern for autoimmune etiology given her family history of Lupus in her father. She is being referred to GI today. Her main concern is her constipation refractory to lactulose.  -- GI referral  -- Start lubiprostone 8 mg BID -- Follow up 4 weeks

## 2017-10-04 NOTE — Progress Notes (Signed)
Case discussed with Dr. Philipp Ovens at the time of the visit.  We reviewed the resident's history and exam and pertinent patient test results.  I agree with the assessment, diagnosis and plan of care documented in the resident's note.

## 2017-10-24 ENCOUNTER — Telehealth: Payer: Self-pay | Admitting: *Deleted

## 2017-10-24 NOTE — Telephone Encounter (Signed)
Information was [place] for Victoza. PA [id number].  Approved 10/24/2017 thru 10/24/2018.  Sander Nephew , RN 1:30 PM 12/23/2017.

## 2017-11-06 ENCOUNTER — Ambulatory Visit: Payer: Medicaid Other | Admitting: Gastroenterology

## 2017-11-06 NOTE — Progress Notes (Deleted)
Referring Provider: Velna Ochs, MD Primary Care Physician:  Alphonzo Grieve, MD   Reason for Consultation: Constipation   IMPRESSION:  Chronic constipation Acute diverticulitis August 2019 Hospitalization for acute enteritis August 2019 Sigmoid diverticulosis noted on CT scan  PLAN: ***   HPI: Misty [name] is a 37 y.o. female seen in consultation at the request of Dr. Philipp Ovens for further evaluation of constipation.  The history is obtained through the patient and review of her electronic health record.  She has type 2 diabetes mellitus and morbid obesity.  She was treated as an outpatient for CT-diagnosed rectosigmoid diverticulitis starting August 12 with ciprofloxacin and metronidazole.  She was hospitalized August 16-19 2019 for acute peritonitis with enteritis after presenting with the sudden onset of epigastric abdominal pain with associated distention, fever, and tachycardia. Repeat CT abdomen pelvis demonstrated new acute diffuse mesenteric inflammation with interloop and free pelvic fluid. There was a questionable loop of mid small bowel with associated wall thickening, concerning for secondary to vasculitis, peritonitis or acute enteritis.  She was treated with IV fluids, IV antibiotics, bowel rest and IV antiemetics. She completed 10 days of treatment with Cipro 500 mg twice daily and metronidazole 500 mg twice daily. While hospitalized she was seen by Dr. Barry Dienes with surgery who recommended conservative management.  Stool was C. difficile negative.  The patient left AMA due to family obligations.    Seen in hospital follow-up by Dr. Philipp Ovens 10/03/17. Abdominal pain had improved. Primary complaint that time time was constipation and  lubiprostone 8 mg BID was started.    No abdominal imaging available prior to August 2019. Father with lupus.  Mother had stomach cancer.        Past Medical History:  Diagnosis Date  . Chest pain    multiple ED  evaluations, most likely musculoskeletal (cervical radiculopathy), needs MRI spine  . Diabetes mellitus   . GERD (gastroesophageal reflux disease)   . Gestational diabetes   . PCOD (polycystic ovarian disease)    Followed by [facility], has been on metformin    Past Surgical History:  Procedure Laterality Date  . CESAREAN SECTION  12/14/2011   Procedure: CESAREAN SECTION;  Surgeon: Lahoma Crocker, MD;  Location: [place] [facility];  Service: Obstetrics;  Laterality: N/A;  Primary Cesarean Section Delivery Girl @ [phone], [place]    Prior to Admission medications   Medication Sig Start Date End Date Taking? Authorizing Provider  acetaminophen (TYLENOL) 500 MG tablet Take 1,500 mg by mouth every 6 (six) hours as needed (pain.).    [provider]  ibuprofen (IBUPROFEN 100 JUNIOR STRENGTH) 100 MG chewable tablet ibuprofen    [provider]  lidocaine (LIDODERM) 5 %  09/24/17   [provider]  liraglutide (VICTOZA) 18 MG/3ML SOPN Inject 0.1 mLs (0.6 mg total) into the skin once a week. 10/03/17   Velna Ochs, MD  lubiprostone (AMITIZA) 8 MCG capsule Take 1 capsule (8 mcg total) by mouth 2 (two) times daily with a meal. 10/03/17   Velna Ochs, MD  methocarbamol (ROBAXIN) 500 MG tablet Robaxin 500 mg tablet  Take 1 tablet 3 times a day by oral route as needed for 10 days.    [provider]    Current Outpatient Medications  Medication Sig Dispense Refill  . acetaminophen (TYLENOL) 500 MG tablet Take 1,500 mg by mouth every 6 (six) hours as needed (pain.).    Marland Kitchen ibuprofen (IBUPROFEN 100 JUNIOR STRENGTH) 100 MG chewable tablet ibuprofen    .  lidocaine (LIDODERM) 5 %   1  . liraglutide (VICTOZA) 18 MG/3ML SOPN Inject 0.1 mLs (0.6 mg total) into the skin once a week. 3 mL 0  . lubiprostone (AMITIZA) 8 MCG capsule Take 1 capsule (8 mcg total) by mouth 2 (two) times daily with a meal. 60 capsule 1  . methocarbamol (ROBAXIN) 500 MG tablet Robaxin 500 mg tablet  Take 1  tablet 3 times a day by oral route as needed for 10 days.     No current facility-administered medications for this visit.     Allergies as of 11/06/2017  . (No Known Allergies)    Family History  Problem Relation Age of Onset  . Diabetes Mother   . Hypertension Mother   . Stomach cancer Mother        diet at 38  . Heart disease Father        diet at 35  . Diabetes Sister   . Hypertension Sister   . Diabetes Brother   . Cancer Maternal Grandmother   . Cancer Paternal Grandmother     Social History   Socioeconomic History  . Marital status: Single    Spouse name: Not on file  . Number of children: Not on file  . Years of education: Not on file  . Highest education level: Not on file  Occupational History  . Not on file  Social Needs  . Financial resource strain: Somewhat hard  . Food insecurity:    Worry: Sometimes true    Inability: Sometimes true  . Transportation needs:    Medical: No    Non-medical: No  Tobacco Use  . Smoking status: Current Every Day Smoker    Packs/day: 0.50    Types: Cigarettes  . Smokeless tobacco: Never Used  . Tobacco comment: 0.5ppd  Substance and Sexual Activity  . Alcohol use: No    Alcohol/week: 0.0 standard drinks  . Drug use: No  . Sexual activity: Yes  Lifestyle  . Physical activity:    Days per week: 1 day    Minutes per session: 10 min  . Stress: Very much  Relationships  . Social connections:    Talks on phone: More than three times a week    Gets together: More than three times a week    Attends religious service: Never    Active member of club or organization: No    Attends meetings of clubs or organizations: Never    Relationship status: Never married  . Intimate partner violence:    Fear of current or ex partner: Not on file    Emotionally abused: Not on file    Physically abused: Not on file    Forced sexual activity: Not on file  Other Topics Concern  . Not on file  Social History Narrative  . Not on  file    Review of Systems: 12 system ROS is negative except as noted above.   Physical Exam: Vital signs were reviewed. General:   Alert, well-nourished, pleasant and cooperative in NAD Head:  Normocephalic and atraumatic. Eyes:  Sclera clear, no icterus.   Conjunctiva pink. Mouth:  No deformity or lesions.   Neck:  Supple; no thyromegaly. Lungs:  Clear throughout to auscultation.   No wheezes.  Heart:  Regular rate and rhythm; no murmurs Abdomen:  Soft, nontender, normal bowel sounds. No rebound or guarding. No hepatosplenomegaly Rectal:  Deferred  Msk:  Symmetrical without gross deformities. Extremities:  No gross deformities or edema. Neurologic:  Alert and  oriented x4;  grossly nonfocal Skin:  No rash or bruise. Psych:  Alert and cooperative. Normal mood and affect.   Myrel Rappleye L. Tarri Glenn Md, MPH [facility] 11/06/2017, 10:31 AM

## 2017-11-07 ENCOUNTER — Other Ambulatory Visit: Payer: Self-pay | Admitting: *Deleted

## 2017-11-07 DIAGNOSIS — E1165 Type 2 diabetes mellitus with hyperglycemia: Secondary | ICD-10-CM

## 2017-11-08 MED ORDER — LIRAGLUTIDE 18 MG/3ML ~~LOC~~ SOPN: 0.6000 mg | PEN_INJECTOR | SUBCUTANEOUS | 0 refills | Status: DC

## 2017-11-13 NOTE — Addendum Note (Signed)
Addended by: Hulan Fray on: 11/13/2017 07:10 AM   Modules accepted: Orders

## 2017-11-21 ENCOUNTER — Encounter: Payer: Self-pay | Admitting: Internal Medicine

## 2017-11-21 ENCOUNTER — Encounter: Payer: Medicaid Other | Admitting: Internal Medicine

## 2017-12-04 ENCOUNTER — Telehealth: Payer: Self-pay | Admitting: Pharmacist

## 2017-12-04 DIAGNOSIS — E1165 Type 2 diabetes mellitus with hyperglycemia: Secondary | ICD-10-CM

## 2017-12-04 MED ORDER — GLUCOSE BLOOD VI STRP: ORAL_STRIP | 12 refills | Status: DC

## 2017-12-04 MED ORDER — SITAGLIPTIN PHOSPHATE 100 MG PO TABS: 100.0000 mg | ORAL_TABLET | Freq: Every day | ORAL | 3 refills | Status: DC

## 2017-12-04 MED ORDER — ACCU-CHEK SOFTCLIX LANCETS MISC: 12 refills | Status: DC

## 2017-12-04 MED ORDER — ACCU-CHEK AVIVA DEVI: 0 refills | Status: DC

## 2017-12-04 NOTE — Progress Notes (Signed)
Contacted patient per clinic consult to follow up with DM management. Patient states she never initiated liraglutide therapy as prescribed 1-2 months ago due to insurance challenges. She also reported concerns about potential GI side effects. Discussed with attending (PCP copied) to switch from liraglutide to sitagliptin.  Sitagliptin (Januvia) was reviewed with the patient, including name, instructions, indication, goals of therapy, potential side effects, importance of adherence, and safe use. Sent in prescription for home BG testing supplies as well and provided education. Will follow up with patient in a few weeks to evaluate efficacy/side effects of sitagliptin.  Patient verbalized understanding by repeating back information and was advised to contact me if medication-related questions arise. Patient was also provided an information handout.

## 2017-12-06 ENCOUNTER — Other Ambulatory Visit: Payer: Self-pay | Admitting: *Deleted

## 2017-12-12 ENCOUNTER — Telehealth: Payer: Self-pay | Admitting: *Deleted

## 2017-12-12 NOTE — Telephone Encounter (Signed)
Call to [place] for PA for Januvia.  Information was given.  PA was approved 12/12/2017 thru 12/07/2018.  [place] [id number]. I [id number]. [facility] was called and notified of.   Sander Nephew, RN 12/12/2017 11:44 AM.

## 2017-12-20 ENCOUNTER — Telehealth: Payer: Self-pay | Admitting: Pharmacist

## 2017-12-20 DIAGNOSIS — E1165 Type 2 diabetes mellitus with hyperglycemia: Secondary | ICD-10-CM

## 2017-12-20 MED ORDER — SITAGLIPTIN PHOSPHATE 100 MG PO TABS: 100.0000 mg | ORAL_TABLET | Freq: Every day | ORAL | 3 refills | Status: DC

## 2017-12-20 NOTE — Telephone Encounter (Signed)
Contacted patient for follow up after starting sitagliptin. Patient states she has not yet started the medication, she believes [facility] is overcharging her ($10 copay which should be $3 under Medicaid). Patient requested a transfer to [facility] sent Rx to [facility] and will follow up with patient in 1-2 weeks. Patient advised to contact clinic if concerns arise, she verbalized understanding.

## 2017-12-21 NOTE — Telephone Encounter (Signed)
Thank you!  Misty [name] 

## 2018-01-14 ENCOUNTER — Encounter: Payer: Self-pay | Admitting: Internal Medicine

## 2018-01-17 ENCOUNTER — Ambulatory Visit: Payer: Medicaid Other

## 2018-01-17 ENCOUNTER — Other Ambulatory Visit: Payer: Self-pay

## 2018-01-17 ENCOUNTER — Ambulatory Visit (INDEPENDENT_AMBULATORY_CARE_PROVIDER_SITE_OTHER): Payer: Medicaid Other | Admitting: Internal Medicine

## 2018-01-17 VITALS — BP 137/70 | HR 72 | Temp 98.1°F | Ht 73.0 in | Wt 264.7 lb

## 2018-01-17 DIAGNOSIS — L732 Hidradenitis suppurativa: Secondary | ICD-10-CM | POA: Diagnosis not present

## 2018-01-17 DIAGNOSIS — K219 Gastro-esophageal reflux disease without esophagitis: Secondary | ICD-10-CM | POA: Diagnosis not present

## 2018-01-17 DIAGNOSIS — E119 Type 2 diabetes mellitus without complications: Secondary | ICD-10-CM | POA: Diagnosis not present

## 2018-01-17 DIAGNOSIS — E282 Polycystic ovarian syndrome: Secondary | ICD-10-CM | POA: Diagnosis not present

## 2018-01-17 MED ORDER — DOXYCYCLINE HYCLATE 100 MG PO TABS: 100.0000 mg | ORAL_TABLET | Freq: Every day | ORAL | 0 refills | Status: DC

## 2018-01-17 NOTE — Progress Notes (Signed)
   CC: hidradenitis suppurativa  HPI:  Misty [name] is a 38 y.o. with a PMH of T2DM, GERD, PCOS presenting to clinic for evaluation of multiple boils.  Patient states about 1 week ago she noticed a firm, nodular lesion in her groin and yesterday noted same finding on her bottom and left underarm. She notes that they have not drained; they are tender. She denies fevers, chills, nausea, vomiting. She has had similar findings previously which had been treated with I&D.   Please see problem based Assessment and Plan for status of patients chronic conditions.  Past Medical History:  Diagnosis Date  . Chest pain    multiple ED evaluations, most likely musculoskeletal (cervical radiculopathy), needs MRI spine  . Diabetes mellitus   . Diverticulitis 2019  . GERD (gastroesophageal reflux disease)   . Gestational diabetes   . PCOD (polycystic ovarian disease)    Followed by [facility], has been on metformin    Review of Systems:   Per HPI  Physical Exam:  Vitals:   01/17/18 1013  BP: 137/70  Pulse: 72  Temp: 98.1 F (36.7 C)  TempSrc: Oral  SpO2: 100%  Weight: 264 lb 11.2 oz (120.1 kg)  Height: 6' 1"  (1.854 m)   GENERAL- alert, co-operative, appears as stated age, not in any distress. CARDIAC- RRR, no murmurs, rubs or gallops. RESP- Moving equal volumes of air, and clear to auscultation bilaterally, no wheezes or crackles. SKIN- ~3cm oblong, subcutaneous, nonfluctuant nodule at level of right ischeal tuberosity; same on left axilla. Bilateral axilla with scaring and evidence of tracking PSYCH- Normal mood and affect, appropriate thought content and speech.  Assessment & Plan:   See Encounters Tab for problem based charting.   Patient discussed with Dr. Abelardo Diesel, MD Internal Medicine PGY-3

## 2018-01-17 NOTE — Patient Instructions (Signed)
Take doxycycline 162m every day and we will see you back in 1 month  Keep the areas clean with mild soap and water. Use warm compresses to help with pain and swelling. Do not pick or try to drain the areas.   [facility].  To help decrease risk of further flares, it is recommended to stop smoking, increase activity and work on weight loss.   I'll message Dr. KMaudie Mercuryto see if you qualify for medication assistance.     Hidradenitis Suppurativa Hidradenitis suppurativa is a long-term (chronic) skin disease. It is similar to a severe form of acne, but it affects areas of the body where acne would be unusual, especially areas of the body where skin rubs against skin and becomes moist. These include:  Underarms.  Groin.  Genital area.  Buttocks.  Upper thighs.  Breasts. Hidradenitis suppurativa may start out as small lumps or pimples caused by blocked sweat glands or hair follicles. Pimples may develop into deep sores that break open (rupture) and drain pus. Over time, affected areas of skin may thicken and become scarred. This condition is rare and does not spread from person to person (non-contagious). What are the causes? The exact cause of this condition is not known. It may be related to:  Female and female hormones.  An overactive disease-fighting system (immune system). The immune system may over-react to blocked hair follicles or sweat glands and cause swelling and pus-filled sores. What increases the risk? You are more likely to develop this condition if you:  Are female.  Are [age]years old.  Have a family history of hidradenitis suppurativa.  Have a personal history of acne.  Are overweight.  Smoke.  Take the medicine lithium. What are the signs or symptoms? The first symptoms are usually painful bumps in the skin, similar to pimples. The condition may get worse over time (progress), or it may  only cause mild symptoms. If the disease progresses, symptoms may include:  Skin bumps getting bigger and growing deeper into the skin.  Bumps rupturing and draining pus.  Itchy, infected skin.  Skin getting thicker and scarred.  Tunnels under the skin (fistulas) where pus drains from a bump.  Pain during daily activities, such as pain during walking if your groin area is affected.  Emotional problems, such as stress or depression. This condition may affect your appearance and your ability or willingness to wear certain clothes or do certain activities. How is this diagnosed? This condition is diagnosed by a health care provider who specializes in skin diseases (dermatologist). You may be diagnosed based on:  Your symptoms and medical history.  A physical exam.  Testing a pus sample for infection.  Blood tests. How is this treated? Your treatment will depend on how severe your symptoms are. The same treatment will not work for everybody with this condition. You may need to try several treatments to find what works best for you. Treatment may include:  Cleaning and bandaging (dressing) your wounds as needed.  Lifestyle changes, such as new skin care routines.  Taking medicines, such as: ? Antibiotics. ? Acne medicines. ? Medicines to reduce the activity of the immune system. ? A diabetes medicine (metformin). ? Birth control pills, for women. ? Steroids to reduce swelling and pain.  Working with a mental health care provider, if you experience emotional distress due to this condition. If you have severe symptoms that do not get  better with medicine, you may need surgery. Surgery may involve:  Using a laser to clear the skin and remove hair follicles.  Opening and draining deep sores.  Removing the areas of skin that are diseased and scarred. Follow these instructions at home: Medicines   Take over-the-counter and prescription medicines only as told by your health  care provider.  If you were prescribed an antibiotic medicine, take it as told by your health care provider. Do not stop taking the antibiotic even if your condition improves. Skin care  If you have open wounds, cover them with a clean dressing as told by your health care provider. Keep wounds clean by washing them gently with soap and water when you bathe.  Do not shave the areas where you get hidradenitis suppurativa.  Do not wear deodorant.  Wear loose-fitting clothes.  Try to avoid getting overheated or sweaty. If you get sweaty or wet, change into clean, dry clothes as soon as you can.  To help relieve pain and itchiness, cover sore areas with a warm, clean washcloth (warm compress) for 5-10 minutes as often as needed.  If told by your health care provider, take a bleach bath twice a week: ? Fill your bathtub halfway with water. ? Pour in  cup of unscented household bleach. ? Soak in the tub for 5-10 minutes. ? Only soak from the neck down. Avoid water on your face and hair. ? Shower to rinse off the bleach from your skin. General instructions  Learn as much as you can about your disease so that you have an active role in your treatment. Work closely with your health care provider to find treatments that work for you.  If you are overweight, work with your health care provider to lose weight as recommended.  Do not use any products that contain nicotine or tobacco, such as cigarettes and e-cigarettes. If you need help quitting, ask your health care provider.  If you struggle with living with this condition, talk with your health care provider or work with a mental health care provider as recommended.  Keep all follow-up visits as told by your health care provider. This is important. Where to find more information  Hidradenitis [place].: [email or web address]-foundation.org/ Contact a health care provider if you have:  A flare-up of hidradenitis  suppurativa.  A fever or chills.  Trouble controlling your symptoms at home.  Trouble doing your daily activities because of your symptoms.  Trouble dealing with emotional problems related to your condition. Summary  Hidradenitis suppurativa is a long-term (chronic) skin disease. It is similar to a severe form of acne, but it affects areas of the body where acne would be unusual.  The first symptoms are usually painful bumps in the skin, similar to pimples. The condition may get worse over time (progress), or it may only cause mild symptoms.  If you have open wounds, cover them with a clean dressing as told by your health care provider. Keep wounds clean by washing them gently with soap and water when you bathe.  Besides skin care, treatment may include medicines, laser treatment, and surgery. This information is not intended to replace advice given to you by your health care provider. Make sure you discuss any questions you have with your health care provider. Document Released: 08/03/2003 Document Revised: 12/27/2016 Document Reviewed: 12/27/2016 Elsevier Interactive Patient Education  2019 [employer].

## 2018-01-18 ENCOUNTER — Encounter: Payer: Self-pay | Admitting: Internal Medicine

## 2018-01-18 DIAGNOSIS — L732 Hidradenitis suppurativa: Secondary | ICD-10-CM

## 2018-01-18 HISTORY — DX: Hidradenitis suppurativa: L73.2

## 2018-01-18 NOTE — Assessment & Plan Note (Signed)
Patient with findings consistent with hidradenitis suppurativa [place] stage 3. She reports family history of similar findings and has associated factors of obesity, smoking.   We discussed this diagnosis as well as overall management.  Plan: --start doxycycline 140m daily --advised on keeping areas clean --advised use of compresses and OTC tylenol/ibuprofen for pain relief --advised to not attempt drainage --advised to pursue smoking cessation and encouraged weight loss through diet changes and increase in activity --[facility] --f/u in 1 month; advised to follow up sooner if fevers, chills, worsening symptoms.

## 2018-01-21 NOTE — Progress Notes (Signed)
Internal Medicine Clinic Attending  Case discussed with Dr. Svalina  at the time of the visit.  We reviewed the resident's history and exam and pertinent patient test results.  I agree with the assessment, diagnosis, and plan of care documented in the resident's note.  

## 2018-02-05 ENCOUNTER — Ambulatory Visit: Payer: Medicaid Other | Admitting: Pharmacist

## 2018-05-29 ENCOUNTER — Encounter (HOSPITAL_COMMUNITY): Payer: Self-pay | Admitting: Emergency Medicine

## 2018-05-29 ENCOUNTER — Emergency Department (HOSPITAL_COMMUNITY): Payer: Medicaid Other

## 2018-05-29 ENCOUNTER — Other Ambulatory Visit: Payer: Self-pay

## 2018-05-29 ENCOUNTER — Emergency Department (HOSPITAL_COMMUNITY)
Admission: EM | Admit: 2018-05-29 | Discharge: 2018-05-29 | Disposition: A | Discharge status: Home / Self Care | Payer: Medicaid Other | Attending: Emergency Medicine | Admitting: Emergency Medicine

## 2018-05-29 DIAGNOSIS — S71101A Unspecified open wound, right thigh, initial encounter: Secondary | ICD-10-CM | POA: Insufficient documentation

## 2018-05-29 DIAGNOSIS — Z79899 Other long term (current) drug therapy: Secondary | ICD-10-CM | POA: Diagnosis not present

## 2018-05-29 DIAGNOSIS — Y92039 Unspecified place in apartment as the place of occurrence of the external cause: Secondary | ICD-10-CM | POA: Diagnosis not present

## 2018-05-29 DIAGNOSIS — W3400XA Accidental discharge from unspecified firearms or gun, initial encounter: Secondary | ICD-10-CM | POA: Diagnosis not present

## 2018-05-29 DIAGNOSIS — S71142A Puncture wound with foreign body, left thigh, initial encounter: Secondary | ICD-10-CM | POA: Insufficient documentation

## 2018-05-29 DIAGNOSIS — Y999 Unspecified external cause status: Secondary | ICD-10-CM | POA: Diagnosis not present

## 2018-05-29 DIAGNOSIS — Z23 Encounter for immunization: Secondary | ICD-10-CM | POA: Insufficient documentation

## 2018-05-29 DIAGNOSIS — E119 Type 2 diabetes mellitus without complications: Secondary | ICD-10-CM | POA: Diagnosis not present

## 2018-05-29 DIAGNOSIS — F17210 Nicotine dependence, cigarettes, uncomplicated: Secondary | ICD-10-CM | POA: Diagnosis not present

## 2018-05-29 DIAGNOSIS — Y9389 Activity, other specified: Secondary | ICD-10-CM | POA: Insufficient documentation

## 2018-05-29 DIAGNOSIS — S71102A Unspecified open wound, left thigh, initial encounter: Secondary | ICD-10-CM | POA: Diagnosis not present

## 2018-05-29 HISTORY — DX: Polycystic ovarian syndrome: E28.2

## 2018-05-29 HISTORY — DX: Type 2 diabetes mellitus without complications: E11.9

## 2018-05-29 MED ORDER — KETOROLAC TROMETHAMINE 15 MG/ML IJ SOLN
15.0000 mg | Freq: Once | INTRAMUSCULAR | Status: AC
  Administered 2018-05-29: 15 mg via INTRAVENOUS
  Filled 2018-05-29: qty 1

## 2018-05-29 MED ORDER — FENTANYL CITRATE (PF) 100 MCG/2ML IJ SOLN
100.0000 ug | Freq: Once | INTRAMUSCULAR | Status: AC
  Administered 2018-05-29: 17:00:00 100 ug via INTRAVENOUS
  Filled 2018-05-29: qty 2

## 2018-05-29 MED ORDER — TETANUS-DIPHTH-ACELL PERTUSSIS 5-2.5-18.5 LF-MCG/0.5 IM SUSP
0.5000 mL | Freq: Once | INTRAMUSCULAR | Status: AC
  Administered 2018-05-29: 17:00:00 0.5 mL via INTRAMUSCULAR
  Filled 2018-05-29: qty 0.5

## 2018-05-29 MED ORDER — NAPROXEN 500 MG PO TABS: 500.0000 mg | ORAL_TABLET | Freq: Two times a day (BID) | ORAL | 0 refills | Status: DC

## 2018-05-29 MED ORDER — CEPHALEXIN 250 MG PO CAPS: 250.0000 mg | ORAL_CAPSULE | Freq: Four times a day (QID) | ORAL | 0 refills | Status: DC

## 2018-05-29 NOTE — ED Notes (Signed)
Nurse navigator and police at bedside. Patient wanted to update family herself later. Police took clothes with a hole in them and a projectile that was placed in a container.
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
## 2018-05-29 NOTE — ED Notes (Signed)
Portable xray at bedside.
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
## 2018-05-29 NOTE — ED Notes (Signed)
Patient's x-rays have not resulted yet - radiology called - state there is a problem with getting the femur x-ray to send and they are working on it.

## 2018-05-29 NOTE — Progress Notes (Signed)
Orthopedic Tech Progress Note Patient Details:  Misty [name] [birth year] [id number]  Ortho Devices Ortho Device/Splint Location: Level 2 Trauma       [place] Pink 05/29/2018, 4:55 PM
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
## 2018-05-30 ENCOUNTER — Encounter: Payer: Self-pay | Admitting: Internal Medicine

## 2018-06-13 ENCOUNTER — Encounter: Payer: Self-pay | Admitting: *Deleted

## 2018-06-25 ENCOUNTER — Ambulatory Visit (INDEPENDENT_AMBULATORY_CARE_PROVIDER_SITE_OTHER): Payer: Medicaid Other | Admitting: Internal Medicine

## 2018-06-25 ENCOUNTER — Encounter: Payer: Self-pay | Admitting: Internal Medicine

## 2018-06-25 ENCOUNTER — Other Ambulatory Visit: Payer: Self-pay

## 2018-06-25 DIAGNOSIS — Z7984 Long term (current) use of oral hypoglycemic drugs: Secondary | ICD-10-CM

## 2018-06-25 DIAGNOSIS — M545 Low back pain, unspecified: Secondary | ICD-10-CM

## 2018-06-25 DIAGNOSIS — Z91120 Patient's intentional underdosing of medication regimen due to financial hardship: Secondary | ICD-10-CM

## 2018-06-25 DIAGNOSIS — E118 Type 2 diabetes mellitus with unspecified complications: Secondary | ICD-10-CM | POA: Diagnosis not present

## 2018-06-25 DIAGNOSIS — Z79899 Other long term (current) drug therapy: Secondary | ICD-10-CM | POA: Diagnosis not present

## 2018-06-25 DIAGNOSIS — M542 Cervicalgia: Secondary | ICD-10-CM

## 2018-06-25 DIAGNOSIS — E1165 Type 2 diabetes mellitus with hyperglycemia: Secondary | ICD-10-CM

## 2018-06-25 DIAGNOSIS — M25572 Pain in left ankle and joints of left foot: Secondary | ICD-10-CM

## 2018-06-25 DIAGNOSIS — G8929 Other chronic pain: Secondary | ICD-10-CM

## 2018-06-25 DIAGNOSIS — Z791 Long term (current) use of non-steroidal anti-inflammatories (NSAID): Secondary | ICD-10-CM | POA: Diagnosis not present

## 2018-06-25 DIAGNOSIS — M546 Pain in thoracic spine: Secondary | ICD-10-CM | POA: Insufficient documentation

## 2018-06-25 MED ORDER — SITAGLIPTIN PHOSPHATE 100 MG PO TABS: 100.0000 mg | ORAL_TABLET | Freq: Every day | ORAL | 3 refills | Status: DC

## 2018-06-25 MED ORDER — NAPROXEN 500 MG PO TABS: 500.0000 mg | ORAL_TABLET | Freq: Two times a day (BID) | ORAL | 0 refills | Status: DC

## 2018-06-25 MED ORDER — DICLOFENAC SODIUM 1 % TD GEL: 2.0000 g | Freq: Four times a day (QID) | TRANSDERMAL | 1 refills | Status: DC

## 2018-06-25 NOTE — Assessment & Plan Note (Signed)
>>  ASSESSMENT AND PLAN FOR DIABETES TYPE 2, UNCONTROLLED WRITTEN ON 06/25/2018  2:56 PM BY MELVIN, ALEXANDER, MD   Patient has a history of Diabetes, last A1c 7.8 about 8 months ago. She has not taken medication consistently for some time due to financial issue, but now has "full" medicare and is asking for refill of her Januvia. - Januvia 100mg  Daily - Follow up in about 2 months for PCP visit and A1c check on medication

## 2018-06-25 NOTE — Patient Instructions (Addendum)
Thank you for allowing [place] to care for you  For your Left ankle pain - This is consistent with inflammation of the soft tissues of this joint - Prescription for Naproxen 518m Twice a day refilled, take for 2 weeks - Voltaren gel prescribed, use 4 times a day - You can try a sports ankle brace if movement continue to bother you - Continue to rest this leg  For your Diabetes - Januvia 1098mDaily refilled - Follow up with PCP in about 2-3 months  Record request form for your orthopedic doctor filled out today   Follow up with PCP in about 2-3 months

## 2018-06-25 NOTE — Assessment & Plan Note (Signed)
Patient has a history of Diabetes, last A1c 7.8 about 8 months ago. She has not taken medication consistently for some time due to financial issue, but now has "full" medicare and is asking for refill of her Januvia. - Januvia 113m Daily - Follow up in about 2 months for PCP visit and A1c check on medication

## 2018-06-25 NOTE — Assessment & Plan Note (Signed)
Seen by [facility], obtaining records, referred to pain clinic

## 2018-06-25 NOTE — Progress Notes (Signed)
   CC: Left ankle pain, Diabetes   HPI:   [name] is a 38 y.o. F with PMHx listed below presenting for Left ankle pain, Diabetes. Please see the A&P for the status of the patient's chronic medical problems.  Past Medical History:  Diagnosis Date  . Chest pain    multiple ED evaluations, most likely musculoskeletal (cervical radiculopathy), needs MRI spine  . Diabetes mellitus   . Diabetes mellitus without complication ([place])   . Diverticulitis 2019  . GERD (gastroesophageal reflux disease)   . Gestational diabetes   . Hidradenitis suppurativa 01/18/2018  . PCOD (polycystic ovarian disease)    Followed by [facility], has been on metformin  . PCOS (polycystic ovarian syndrome)    Review of Systems:  Performed and all others negative.  Physical Exam:  Vitals:   06/25/18 1314  BP: (!) 149/81  Pulse: 90  Temp: 98.6 F (37 C)  TempSrc: Oral  SpO2: 100%  Weight: 264 lb 3.2 oz (119.8 kg)  Height: 6' 1"  (1.854 m)   Physical Exam Constitutional:      General: She is not in acute distress.    Appearance: Normal appearance. She is obese.     Comments: In wheelchair due to chronic back pain  Cardiovascular:     Rate and Rhythm: Normal rate and regular rhythm.     Pulses: Normal pulses.     Heart sounds: Normal heart sounds.  Pulmonary:     Effort: Pulmonary effort is normal. No respiratory distress.     Breath sounds: Normal breath sounds.  Abdominal:     General: Bowel sounds are normal. There is no distension.     Palpations: Abdomen is soft.     Tenderness: There is no abdominal tenderness.  Musculoskeletal:        General: No swelling or deformity.     Comments: Left ankle - ROM intact, strength/sensation intact, good pulses. - Tender to palpation in the region of the medial malleolus  - Pain reproduced with movement of ankle in any plane - No warmth or erythema. Trace edema.  Skin:    General: Skin is warm and dry.  Neurological:     General: No focal  deficit present.     Mental Status: Mental status is at baseline.    Assessment & Plan:   See Encounters Tab for problem based charting.  Patient discussed with Dr. Angelia Mould
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
## 2018-06-26 NOTE — Progress Notes (Signed)
Internal Medicine Clinic Attending  Case discussed with Dr. Melvin  at the time of the visit.  We reviewed the resident's history and exam and pertinent patient test results.  I agree with the assessment, diagnosis, and plan of care documented in the resident's note.  

## 2018-09-18 ENCOUNTER — Other Ambulatory Visit: Payer: Self-pay

## 2018-09-18 ENCOUNTER — Encounter: Payer: Self-pay | Admitting: Internal Medicine

## 2018-09-18 ENCOUNTER — Ambulatory Visit: Payer: Medicaid Other | Admitting: Internal Medicine

## 2018-09-18 VITALS — BP 144/71 | HR 84 | Ht 73.0 in | Wt 265.6 lb

## 2018-09-18 DIAGNOSIS — N760 Acute vaginitis: Secondary | ICD-10-CM | POA: Diagnosis not present

## 2018-09-18 DIAGNOSIS — Z79899 Other long term (current) drug therapy: Secondary | ICD-10-CM | POA: Diagnosis not present

## 2018-09-18 DIAGNOSIS — Z111 Encounter for screening for respiratory tuberculosis: Secondary | ICD-10-CM

## 2018-09-18 DIAGNOSIS — E118 Type 2 diabetes mellitus with unspecified complications: Secondary | ICD-10-CM | POA: Diagnosis not present

## 2018-09-18 DIAGNOSIS — I10 Essential (primary) hypertension: Secondary | ICD-10-CM

## 2018-09-18 DIAGNOSIS — E1165 Type 2 diabetes mellitus with hyperglycemia: Secondary | ICD-10-CM | POA: Diagnosis not present

## 2018-09-18 DIAGNOSIS — R3915 Urgency of urination: Secondary | ICD-10-CM

## 2018-09-18 DIAGNOSIS — R350 Frequency of micturition: Secondary | ICD-10-CM

## 2018-09-18 DIAGNOSIS — E282 Polycystic ovarian syndrome: Secondary | ICD-10-CM | POA: Diagnosis not present

## 2018-09-18 DIAGNOSIS — G4700 Insomnia, unspecified: Secondary | ICD-10-CM | POA: Diagnosis not present

## 2018-09-18 DIAGNOSIS — R319 Hematuria, unspecified: Secondary | ICD-10-CM

## 2018-09-18 DIAGNOSIS — Z7984 Long term (current) use of oral hypoglycemic drugs: Secondary | ICD-10-CM | POA: Diagnosis not present

## 2018-09-18 DIAGNOSIS — E876 Hypokalemia: Secondary | ICD-10-CM | POA: Diagnosis not present

## 2018-09-18 DIAGNOSIS — M545 Low back pain, unspecified: Secondary | ICD-10-CM

## 2018-09-18 DIAGNOSIS — K659 Peritonitis, unspecified: Secondary | ICD-10-CM

## 2018-09-18 LAB — BASIC METABOLIC PANEL
Anion gap: 8 (ref 5–15)
BUN: 8 mg/dL (ref 6–20)
CO2: 24 mmol/L (ref 22–32)
Calcium: 8.7 mg/dL — ABNORMAL LOW (ref 8.9–10.3)
Chloride: 104 mmol/L (ref 98–111)
Creatinine, Ser: 0.68 mg/dL (ref 0.44–1.00)
GFR calc Af Amer: >60 mL/min (ref >60)
GFR calc non Af Amer: >60 mL/min (ref >60)
Glucose, Bld: 346 mg/dL — ABNORMAL HIGH (ref 70–99)
Potassium: 3.3 mmol/L — ABNORMAL LOW (ref 3.5–5.1)
Sodium: 136 mmol/L (ref 135–145)

## 2018-09-18 LAB — POCT GLYCOSYLATED HEMOGLOBIN (HGB A1C): Hemoglobin A1C: 9.9 % — AB (ref 4.0–5.6)

## 2018-09-18 LAB — POCT URINALYSIS DIPSTICK
Bilirubin, UA: NEGATIVE
Glucose, UA: POSITIVE — AB
Ketones, UA: NEGATIVE
Leukocytes, UA: NEGATIVE
Nitrite, UA: NEGATIVE
Protein, UA: NEGATIVE
Spec Grav, UA: 1.025 (ref 1.010–1.025)
Urobilinogen, UA: 0.2 E.U./dL
pH, UA: 5.5 (ref 5.0–8.0)

## 2018-09-18 LAB — GLUCOSE, CAPILLARY: Glucose-Capillary: 350 mg/dL — ABNORMAL HIGH (ref 70–99)

## 2018-09-18 MED ORDER — POTASSIUM CHLORIDE ER 10 MEQ PO TBCR: 10.0000 meq | EXTENDED_RELEASE_TABLET | Freq: Every day | ORAL | 0 refills | Status: DC

## 2018-09-18 MED ORDER — TRAZODONE HCL 50 MG PO TABS: 50.0000 mg | ORAL_TABLET | Freq: Every day | ORAL | 3 refills | Status: DC

## 2018-09-18 MED ORDER — ACCU-CHEK FASTCLIX LANCETS MISC: 3 refills | Status: AC

## 2018-09-18 MED ORDER — PIOGLITAZONE HCL 30 MG PO TABS: 30.0000 mg | ORAL_TABLET | Freq: Every day | ORAL | 3 refills | Status: DC

## 2018-09-18 MED ORDER — ACCU-CHEK GUIDE VI STRP: ORAL_STRIP | 12 refills | Status: AC

## 2018-09-18 NOTE — Patient Instructions (Addendum)
It was nice seeing you today! Thank you for choosing [facility] for your Primary Care.    Today we talked about:   1. Diabetes: Aic today was 9.9%. Start checking your sugars once per day at least. We started a new medication Actos. Continue to take Januvia. Referral to the eye doctor and they call to schedule. Also, a referral was made to Butch Penny, our diabetes educator. She will call you to make the appointment.   2. Pain Control: Referral was made to pain clinic and they call you to schedule an appointment   3. Sleeping: Take Trazodone 1 hour before bedtime and then lay down and relax.   We will do additional labs on your urine and I'll call you with the results.

## 2018-09-19 DIAGNOSIS — G4700 Insomnia, unspecified: Secondary | ICD-10-CM | POA: Insufficient documentation

## 2018-09-19 DIAGNOSIS — I10 Essential (primary) hypertension: Secondary | ICD-10-CM | POA: Insufficient documentation

## 2018-09-19 DIAGNOSIS — R319 Hematuria, unspecified: Secondary | ICD-10-CM | POA: Insufficient documentation

## 2018-09-19 LAB — MICROALBUMIN / CREATININE URINE RATIO
Creatinine, Urine: 138.4 mg/dL
Microalb/Creat Ratio: 8 mg/g creat (ref 0–29)
Microalbumin, Urine: 11.6 ug/mL

## 2018-09-19 NOTE — Assessment & Plan Note (Signed)
Blood pressure today is slightly elevated, and per chart review it has been elevated with her past visits as well.  She denies any shortness of breath, chest pain, leg swelling.  We will defer initiating medication at this time until her next visit in 1 month, as we made many changes within her diabetes regimen.  However, she does have hypokalemia with hypertension and so work-up for possible aldosteronism would be warranted.  Plan:  - Blood work at next visit to include PRA and PAC, with a morning visit - Reassess at next visit

## 2018-09-19 NOTE — Assessment & Plan Note (Addendum)
Patient has been assessed by orthopedic in the past.  She has tried multiple medications, including NSAIDs, Tylenol, and tramadol without significant improvement in pain.  At this point, it is affecting her quality of life as she is not able to travel with her daughter on trips or play as much as she like with her daughter.  She would benefit from a referral to pain medicine, to assess eligibility for other pain control modalities including spinal stimulators.  Plan: - Referral made to pain clinic
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
## 2018-09-19 NOTE — Progress Notes (Signed)
   CC: Diabetes Follow up  HPI:  Misty [name] is a 38 y.o. female with a past medical history of type 2 diabetes, vaginitis supra diva, PCOS, acute peritonitis complicated by mesenteric inflammation who presents to the clinic for follow-up of diabetes.  Please see the A&P for the status of the patient's chronic medical problems.  Past Medical History:  Diagnosis Date  . Chest pain    multiple ED evaluations, most likely musculoskeletal (cervical radiculopathy), needs MRI spine  . Diabetes mellitus   . Diabetes mellitus without complication ([place])   . Diverticulitis 2019  . GERD (gastroesophageal reflux disease)   . Gestational diabetes   . Hidradenitis suppurativa 01/18/2018  . PCOD (polycystic ovarian disease)    Followed by [facility], has been on metformin  . PCOS (polycystic ovarian syndrome)    Review of Systems:   Review of Systems  Constitutional: Negative for chills, fever, malaise/fatigue and weight loss.  Eyes: Positive for blurred vision.  Respiratory: Negative for cough and shortness of breath.   Cardiovascular: Negative for chest pain, palpitations and leg swelling.  Gastrointestinal: Negative for abdominal pain, constipation, diarrhea, nausea and vomiting.  Genitourinary: Positive for frequency and urgency. Negative for dysuria, flank pain and hematuria.  Musculoskeletal: Positive for back pain, myalgias and neck pain. Negative for falls.  Neurological: Positive for tingling. Negative for dizziness and headaches.  Psychiatric/Behavioral: Negative for depression. The patient has insomnia. The patient is not nervous/anxious.    Physical Exam:  Vitals:   09/18/18 1522  BP: (!) 144/71  Pulse: 84  SpO2: 100%  Weight: 265 lb 9.6 oz (120.5 kg)  Height: 6' 1"  (1.854 m)   Physical Exam Constitutional:      General: She is not in acute distress.    Appearance: She is obese. She is not ill-appearing.  HENT:     Head: Normocephalic and atraumatic.   Pulmonary:     Effort: Pulmonary effort is normal. No respiratory distress.  Musculoskeletal:     Right lower leg: No edema.     Left lower leg: No edema.  Skin:    General: Skin is warm and dry.  Neurological:     General: No focal deficit present.     Mental Status: She is alert and oriented to person, place, and time. Mental status is at baseline.     Sensory: No sensory deficit.  Psychiatric:        Mood and Affect: Mood normal.        Behavior: Behavior normal.    Assessment & Plan:   See Encounters Tab for problem based charting.  Patient seen with Dr. Lynnae January

## 2018-09-19 NOTE — Assessment & Plan Note (Signed)
Unknown etiology at this time.  Patient denies any palpitations, chest pain, muscle cramps, nausea, vomiting, diarrhea.  In light of her hypertension, hypoaldosteronism will need to be ruled out.  Differential also includes GI loss.  At this time, will start patient on potassium replacement and reassess at next visit.  Plan: - Potassium chloride 10 mEq/day

## 2018-09-19 NOTE — Assessment & Plan Note (Signed)
Misty [name] denies seeing any hematuria in her urine, however urine dipstick taken here at the clinic for urinary urgency showed presence of hemoglobin.  Urinalysis with reflex was ordered but cannot be obtained, so it is very important that we do this at her next visit.  Creatinine is within normal limits, no evidence of renal dysfunction.   Plan: -Urinalysis with reflex at next visit in 1 month

## 2018-09-19 NOTE — Assessment & Plan Note (Addendum)
Misty [name] reports that she is not currently taking Januvia daily and will take it every other day or so.  In addition she does not have a glucometer at home and so does not check her blood sugars.  Misty [name] endorses urinary frequency and urgency that has been occurring for a while now.  She also notes blurry vision.  She cannot recall any moments of low blood glucose including dizziness, tremors, tachycardia, loss of consciousness.   A1c was reassessed today and came back at 9.9%, which is a significant increase in her A1c 11 months ago that was 7.8%.  We had a long discussion about the necessity for controlling her diabetes at this point before she suffers additional complications.  We noted that her blurry vision could be secondary to diabetes and so a referral to ophthalmology was made.  We also discussed that her peripheral neuropathy could be likely due to diabetes or at least exacerbated by her diabetes.  She is really motivated get control of her diabetes at this time.  In the past she had failed metformin as it caused significant GI symptoms, and Victoza was suspected to be a possible cause of her hospitalization earlier this year for significant enteritis.  At this time, will continue Januvia and patient agrees to start taking it daily.  In addition we have started Actos, as it can be both beneficial for her diabetes, weight loss and PCOS.  We provided her with a glucometer today and send in a prescription for lancets and test strips.  Plan: - Januvia 100 mg daily - Pioglitazone (Actos) 30 mg daily - Begin taking blood sugar at least once per day and if possible twice per day - Repeat A1c December 2020 - 1 month follow-up - Referral to ophthalmology to evaluate for retinopathy

## 2018-09-19 NOTE — Assessment & Plan Note (Signed)
>>  ASSESSMENT AND PLAN FOR DIABETES TYPE 2, UNCONTROLLED WRITTEN ON 09/19/2018  7:22 PM BY [name], MD  Misty [name] reports that she is not currently taking Januvia daily and will take it every other day or so.  In addition she does not have a glucometer at home and so does not check her blood sugars.  Misty [name] endorses urinary frequency and urgency that has been occurring for a while now.  She also notes blurry vision.  She cannot recall any moments of low blood glucose including dizziness, tremors, tachycardia, loss of consciousness.   A1c was reassessed today and came back at 9.9%, which is a significant increase in her A1c 11 months ago that was 7.8%.  We had a long discussion about the necessity for controlling her diabetes at this point before she suffers additional complications.  We noted that her blurry vision could be secondary to diabetes and so a referral to ophthalmology was made.  We also discussed that her peripheral neuropathy could be likely due to diabetes or at least exacerbated by her diabetes.  She is really motivated get control of her diabetes at this time.  In the past she had failed metformin as it caused significant GI symptoms, and Victoza was suspected to be a possible cause of her hospitalization earlier this year for significant enteritis.  At this time, will continue Januvia and patient agrees to start taking it daily.  In addition we have started Actos, as it can be both beneficial for her diabetes, weight loss and PCOS.  We provided her with a glucometer today and send in a prescription for lancets and test strips.  Plan: - Januvia 100 mg daily - Pioglitazone (Actos) 30 mg daily - Begin taking blood sugar at least once per day and if possible twice per day - Repeat A1c December 2020 - 1 month follow-up - Referral to ophthalmology to evaluate for retinopathy

## 2018-09-20 LAB — TB SKIN TEST
Induration: <0 mm
TB Skin Test: NEGATIVE

## 2018-09-23 NOTE — Progress Notes (Signed)
Internal Medicine Clinic Attending  I saw and evaluated the patient.  I personally confirmed the key portions of the history and exam documented by Dr. Charleen Kirks and I reviewed pertinent patient test results.  The assessment, diagnosis, and plan were formulated together and I agree with the documentation in the resident's note.

## 2018-09-25 ENCOUNTER — Encounter: Payer: Medicaid Other | Admitting: Dietician

## 2018-10-01 ENCOUNTER — Telehealth: Payer: Self-pay | Admitting: Licensed Clinical Social Worker

## 2018-10-01 NOTE — Telephone Encounter (Signed)
Patient was contacted due to a referral from her doctor. Pt agreed to services, and will be added to my schedule on 10/13 @ 2:00.

## 2018-10-06 ENCOUNTER — Other Ambulatory Visit: Payer: Self-pay

## 2018-10-06 ENCOUNTER — Emergency Department (HOSPITAL_COMMUNITY): Payer: Medicaid Other

## 2018-10-06 ENCOUNTER — Emergency Department (HOSPITAL_COMMUNITY)
Admission: EM | Admit: 2018-10-06 | Discharge: 2018-10-06 | Disposition: A | Discharge status: Home / Self Care | Payer: Medicaid Other | Attending: Emergency Medicine | Admitting: Emergency Medicine

## 2018-10-06 ENCOUNTER — Encounter (HOSPITAL_COMMUNITY): Payer: Self-pay | Admitting: Emergency Medicine

## 2018-10-06 DIAGNOSIS — R200 Anesthesia of skin: Secondary | ICD-10-CM | POA: Insufficient documentation

## 2018-10-06 DIAGNOSIS — R079 Chest pain, unspecified: Secondary | ICD-10-CM | POA: Diagnosis not present

## 2018-10-06 DIAGNOSIS — M25512 Pain in left shoulder: Secondary | ICD-10-CM | POA: Insufficient documentation

## 2018-10-06 DIAGNOSIS — R0602 Shortness of breath: Secondary | ICD-10-CM | POA: Diagnosis not present

## 2018-10-06 DIAGNOSIS — R0789 Other chest pain: Secondary | ICD-10-CM | POA: Diagnosis not present

## 2018-10-06 DIAGNOSIS — Z79899 Other long term (current) drug therapy: Secondary | ICD-10-CM | POA: Insufficient documentation

## 2018-10-06 DIAGNOSIS — I10 Essential (primary) hypertension: Secondary | ICD-10-CM | POA: Insufficient documentation

## 2018-10-06 DIAGNOSIS — Z720 Tobacco use: Secondary | ICD-10-CM

## 2018-10-06 DIAGNOSIS — R062 Wheezing: Secondary | ICD-10-CM | POA: Insufficient documentation

## 2018-10-06 DIAGNOSIS — R109 Unspecified abdominal pain: Secondary | ICD-10-CM | POA: Insufficient documentation

## 2018-10-06 DIAGNOSIS — R202 Paresthesia of skin: Secondary | ICD-10-CM | POA: Insufficient documentation

## 2018-10-06 DIAGNOSIS — F17210 Nicotine dependence, cigarettes, uncomplicated: Secondary | ICD-10-CM | POA: Insufficient documentation

## 2018-10-06 DIAGNOSIS — Z7984 Long term (current) use of oral hypoglycemic drugs: Secondary | ICD-10-CM | POA: Insufficient documentation

## 2018-10-06 DIAGNOSIS — Z20828 Contact with and (suspected) exposure to other viral communicable diseases: Secondary | ICD-10-CM | POA: Diagnosis not present

## 2018-10-06 DIAGNOSIS — E119 Type 2 diabetes mellitus without complications: Secondary | ICD-10-CM | POA: Insufficient documentation

## 2018-10-06 LAB — TROPONIN I (HIGH SENSITIVITY)
Troponin I (High Sensitivity): 3 ng/L (ref <18)
Troponin I (High Sensitivity): 4 ng/L (ref <18)

## 2018-10-06 LAB — SARS CORONAVIRUS 2 (TAT 6-24 HRS): SARS Coronavirus 2: NEGATIVE

## 2018-10-06 LAB — CBC
HCT: 39.5 % (ref 36.0–46.0)
Hemoglobin: 12.7 g/dL (ref 12.0–15.0)
MCH: 27.2 pg (ref 26.0–34.0)
MCHC: 32.2 g/dL (ref 30.0–36.0)
MCV: 84.6 fL (ref 80.0–100.0)
Platelets: 242 10*3/uL (ref 150–400)
RBC: 4.67 MIL/uL (ref 3.87–5.11)
RDW: 14.6 % (ref 11.5–15.5)
WBC: 10.6 10*3/uL — ABNORMAL HIGH (ref 4.0–10.5)
nRBC: 0 % (ref 0.0–0.2)

## 2018-10-06 LAB — BASIC METABOLIC PANEL
Anion gap: 9 (ref 5–15)
BUN: 7 mg/dL (ref 6–20)
CO2: 24 mmol/L (ref 22–32)
Calcium: 8.9 mg/dL (ref 8.9–10.3)
Chloride: 105 mmol/L (ref 98–111)
Creatinine, Ser: 0.74 mg/dL (ref 0.44–1.00)
GFR calc Af Amer: >60 mL/min (ref >60)
GFR calc non Af Amer: >60 mL/min (ref >60)
Glucose, Bld: 290 mg/dL — ABNORMAL HIGH (ref 70–99)
Potassium: 3.7 mmol/L (ref 3.5–5.1)
Sodium: 138 mmol/L (ref 135–145)

## 2018-10-06 LAB — I-STAT BETA HCG BLOOD, ED (MC, WL, AP ONLY): I-stat hCG, quantitative: <5 m[IU]/mL (ref <5)

## 2018-10-06 MED ORDER — IPRATROPIUM BROMIDE HFA 17 MCG/ACT IN AERS
2.0000 | INHALATION_SPRAY | Freq: Once | RESPIRATORY_TRACT | Status: AC
  Administered 2018-10-06: 2 via RESPIRATORY_TRACT
  Filled 2018-10-06: qty 12.9

## 2018-10-06 MED ORDER — ALBUTEROL SULFATE HFA 108 (90 BASE) MCG/ACT IN AERS
4.0000 | INHALATION_SPRAY | Freq: Once | RESPIRATORY_TRACT | Status: AC
  Administered 2018-10-06: 4 via RESPIRATORY_TRACT
  Filled 2018-10-06: qty 6.7

## 2018-10-06 MED ORDER — SODIUM CHLORIDE 0.9% FLUSH
3.0000 mL | Freq: Once | INTRAVENOUS | Status: AC
  Administered 2018-10-06: 10:00:00 3 mL via INTRAVENOUS

## 2018-10-06 MED ORDER — MAGNESIUM SULFATE 2 GM/50ML IV SOLN
2.0000 g | INTRAVENOUS | Status: AC
  Administered 2018-10-06: 2 g via INTRAVENOUS
  Filled 2018-10-06: qty 50
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
## 2018-10-06 NOTE — ED Notes (Addendum)
Pt ambulated with pulse O2. Pt's O2 was 100% and pulse was between 118-120. Pt stated, "I feel a little short of breath, just a little".

## 2018-10-06 NOTE — ED Notes (Signed)
Pt d/c paperwork provided to pt and reviewed. Given pt opportunity to ask questions. Reviewed medications and plan for f/u. Pt ambulates independently. Unable to sign pad in room- issue with room desktop.

## 2018-10-06 NOTE — ED Triage Notes (Signed)
Patient with chest pain and shortness of breath.  She states that it started yesterday morning.  It has gotten worse in the last 24 hours.  Patient denies any nausea or vomiting at this time.  Patient states she feels like a ton of bricks on her chest.

## 2018-10-06 NOTE — Discharge Instructions (Addendum)
1) use your ipratropium inhaler once 2 puffs once daily 2) use your abuterol 2 puffs every four hours as needed for cough, wheezing, chest tightness and shortness of breath. I have sent a note to your primary care doctor direct message to have you seen in the office in very close follow up. You may take tylenol or motrin for your pain. IT IS VERY IMPORTANT THAT YOU QUIT SMOKING! THIS MAY REPRESENT A WORSEINING OF LUNG DISEASE CAUSED BY YOUR SMOKING. YOU ARE ALSO PUTTING YOURSELF AT EXTREMELY HIGH RISK OF STROKE, HEART ATTACK, AND CANCER. Please make sure that you are taking you diabetic medications as directed.  You have been diagnosed by your caregiver as having chest wall pain. SEEK IMMEDIATE MEDICAL ATTENTION IF: You develop a fever.  Your chest pains become severe or intolerable.  You develop new, unexplained symptoms (problems).  You develop shortness of breath, nausea, vomiting, sweating or feel light headed.  You develop a new cough or you cough up blood.

## 2018-10-07 ENCOUNTER — Telehealth: Payer: Self-pay | Admitting: Dietician

## 2018-10-07 NOTE — Telephone Encounter (Signed)
called patient who agreed to reschedule her appointment for same day she sees Dr. Charleen Kirks.

## 2018-10-08 ENCOUNTER — Emergency Department (HOSPITAL_COMMUNITY)
Admission: EM | Admit: 2018-10-08 | Discharge: 2018-10-09 | Disposition: A | Discharge status: Home / Self Care | Payer: Medicaid Other | Attending: Emergency Medicine | Admitting: Emergency Medicine

## 2018-10-08 ENCOUNTER — Emergency Department (HOSPITAL_COMMUNITY): Payer: Medicaid Other

## 2018-10-08 DIAGNOSIS — R2233 Localized swelling, mass and lump, upper limb, bilateral: Secondary | ICD-10-CM | POA: Diagnosis not present

## 2018-10-08 DIAGNOSIS — R1031 Right lower quadrant pain: Secondary | ICD-10-CM | POA: Insufficient documentation

## 2018-10-08 DIAGNOSIS — F17210 Nicotine dependence, cigarettes, uncomplicated: Secondary | ICD-10-CM | POA: Insufficient documentation

## 2018-10-08 DIAGNOSIS — R0789 Other chest pain: Secondary | ICD-10-CM | POA: Diagnosis not present

## 2018-10-08 DIAGNOSIS — N3000 Acute cystitis without hematuria: Secondary | ICD-10-CM

## 2018-10-08 DIAGNOSIS — R2243 Localized swelling, mass and lump, lower limb, bilateral: Secondary | ICD-10-CM | POA: Insufficient documentation

## 2018-10-08 DIAGNOSIS — R1030 Lower abdominal pain, unspecified: Secondary | ICD-10-CM | POA: Diagnosis present

## 2018-10-08 DIAGNOSIS — R079 Chest pain, unspecified: Secondary | ICD-10-CM | POA: Diagnosis not present

## 2018-10-08 DIAGNOSIS — Z7984 Long term (current) use of oral hypoglycemic drugs: Secondary | ICD-10-CM | POA: Diagnosis not present

## 2018-10-08 DIAGNOSIS — E119 Type 2 diabetes mellitus without complications: Secondary | ICD-10-CM | POA: Diagnosis not present

## 2018-10-08 DIAGNOSIS — Z79899 Other long term (current) drug therapy: Secondary | ICD-10-CM | POA: Diagnosis not present

## 2018-10-08 LAB — COMPREHENSIVE METABOLIC PANEL
ALT: 17 U/L (ref 0–44)
AST: 17 U/L (ref 15–41)
Albumin: 3.6 g/dL (ref 3.5–5.0)
Alkaline Phosphatase: 55 U/L (ref 38–126)
Anion gap: 10 (ref 5–15)
BUN: 9 mg/dL (ref 6–20)
CO2: 23 mmol/L (ref 22–32)
Calcium: 8.9 mg/dL (ref 8.9–10.3)
Chloride: 107 mmol/L (ref 98–111)
Creatinine, Ser: 0.6 mg/dL (ref 0.44–1.00)
GFR calc Af Amer: >60 mL/min (ref >60)
GFR calc non Af Amer: >60 mL/min (ref >60)
Glucose, Bld: 130 mg/dL — ABNORMAL HIGH (ref 70–99)
Potassium: 3.5 mmol/L (ref 3.5–5.1)
Sodium: 140 mmol/L (ref 135–145)
Total Bilirubin: 0.8 mg/dL (ref 0.3–1.2)
Total Protein: 7.2 g/dL (ref 6.5–8.1)

## 2018-10-08 LAB — CBC
HCT: 40 % (ref 36.0–46.0)
Hemoglobin: 12.3 g/dL (ref 12.0–15.0)
MCH: 26.3 pg (ref 26.0–34.0)
MCHC: 30.8 g/dL (ref 30.0–36.0)
MCV: 85.5 fL (ref 80.0–100.0)
Platelets: 258 10*3/uL (ref 150–400)
RBC: 4.68 MIL/uL (ref 3.87–5.11)
RDW: 14.6 % (ref 11.5–15.5)
WBC: 13.8 10*3/uL — ABNORMAL HIGH (ref 4.0–10.5)
nRBC: 0 % (ref 0.0–0.2)

## 2018-10-08 LAB — I-STAT BETA HCG BLOOD, ED (MC, WL, AP ONLY): I-stat hCG, quantitative: <5 m[IU]/mL (ref <5)

## 2018-10-08 LAB — TROPONIN I (HIGH SENSITIVITY): Troponin I (High Sensitivity): 3 ng/L (ref <18)

## 2018-10-08 LAB — LIPASE, BLOOD: Lipase: 36 U/L (ref 11–51)

## 2018-10-08 MED ORDER — SODIUM CHLORIDE 0.9% FLUSH
3.0000 mL | Freq: Once | INTRAVENOUS | Status: AC
  Administered 2018-10-09: 3 mL via INTRAVENOUS

## 2018-10-08 NOTE — ED Triage Notes (Addendum)
Per EMS, pt from home, was cooking dinner and started to experience chest tightness to the point she felt as if "she was going to pass out."  Vitals stable. Used inhaler in truck.    Pt is experiencing lower abdominal pain X1 week.

## 2018-10-09 ENCOUNTER — Other Ambulatory Visit: Payer: Self-pay

## 2018-10-09 LAB — URINALYSIS, ROUTINE W REFLEX MICROSCOPIC
Bilirubin Urine: NEGATIVE
Glucose, UA: NEGATIVE mg/dL
Ketones, ur: NEGATIVE mg/dL
Leukocytes,Ua: NEGATIVE
Nitrite: POSITIVE — AB
Protein, ur: 30 mg/dL — AB
Specific Gravity, Urine: 1.027 (ref 1.005–1.030)
pH: 5 (ref 5.0–8.0)

## 2018-10-09 LAB — TROPONIN I (HIGH SENSITIVITY): Troponin I (High Sensitivity): 3 ng/L (ref <18)

## 2018-10-09 LAB — BRAIN NATRIURETIC PEPTIDE: B Natriuretic Peptide: 28.8 pg/mL (ref 0.0–100.0)

## 2018-10-09 LAB — MAGNESIUM: Magnesium: 1.8 mg/dL (ref 1.7–2.4)

## 2018-10-09 MED ORDER — FUROSEMIDE 10 MG/ML IJ SOLN
20.0000 mg | Freq: Once | INTRAMUSCULAR | Status: AC
  Administered 2018-10-09: 20 mg via INTRAVENOUS
  Filled 2018-10-09: qty 2

## 2018-10-09 MED ORDER — CEPHALEXIN 500 MG PO CAPS: 500.0000 mg | ORAL_CAPSULE | Freq: Two times a day (BID) | ORAL | 0 refills | Status: AC

## 2018-10-09 MED ORDER — KETOROLAC TROMETHAMINE 15 MG/ML IJ SOLN
15.0000 mg | Freq: Once | INTRAMUSCULAR | Status: AC
  Administered 2018-10-09: 15 mg via INTRAVENOUS
  Filled 2018-10-09: qty 1

## 2018-10-09 MED ORDER — DICLOFENAC SODIUM 1 % TD GEL: 2.0000 g | Freq: Four times a day (QID) | TRANSDERMAL | 0 refills | Status: DC

## 2018-10-09 NOTE — Discharge Instructions (Signed)
Take antibiotics as prescribed. Take the entire course, even if your symptoms improve.  Use the voltaren gel for chest pain.  Follow up with your primary care doctor for further evaluation of your symptoms.  Return to the ER with any new, worsening, or concerning symptoms.

## 2018-10-09 NOTE — ED Provider Notes (Addendum)
[facility] Provider Note   CSN: [id number] Arrival date & time: 10/08/18  2120     History   Chief Complaint Chief Complaint  Patient presents with  . Chest Pain  . Abdominal Pain    HPI Misty [name] is a 38 y.o. female presenting for evaluation of lower abd pain and chest tightness.   Pt states 5 days ago she developed RLQ abd pain. Pain migrated to her L side, and has been intermittent since. At that sitme, she also devleoped chest tightness/pressure. It is also intermittent. She was seen in the ED for this a few days ago, had a reassuring cardaic workup and improved with inhalers. Pt states she has had persistent sxs, and today she felt she was going to pass out. Pt denies fevers, chills, cough, n/v, urinary sxs, abnormal BMs. Pt states she has been feeling swollen the past few days. H/o DM, sugars have been in the mid 200's. Pt is unable to tell me if sxs improve with inhalers now. She has not taken anything for pain. She cannot bring on the pain/heaviness, nor make it go away. She cannot quantify how long the pain is there or how often it occurs.   Additional history obtained from chart review. H/o DM, gerd, HS, PCOS, HTN.      HPI  Past Medical History:  Diagnosis Date  . Chest pain    multiple ED evaluations, most likely musculoskeletal (cervical radiculopathy), needs MRI spine  . Diabetes mellitus   . Diabetes mellitus without complication ([place])   . Diverticulitis 2019  . GERD (gastroesophageal reflux disease)   . Gestational diabetes   . Hidradenitis suppurativa 01/18/2018  . PCOD (polycystic ovarian disease)    Followed by [facility], has been on metformin  . PCOS (polycystic ovarian syndrome)     Patient Active Problem List   Diagnosis Date Noted  . Hypertension 09/19/2018  . Insomnia 09/19/2018  . Hematuria 09/19/2018  . Left ankle pain 06/25/2018  . Lumbar spine pain 06/25/2018  . Thoracic spine pain 06/25/2018   . Cervical spine pain 06/25/2018  . Hidradenitis suppurativa 01/18/2018  . History of diverticulitis 10/03/2017  . Constipation 10/03/2017  . 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 Morbid obesity ([place]) 08/17/2017  . Hypokalemia 08/17/2017  . Headache 02/28/2017  . Heart palpitations 06/04/2012  . Tobacco use 06/03/2012  . Preventative health care 05/03/2011  . Diabetes type 2, uncontrolled ([place]) 02/24/2008  . Polycystic ovarian disease 07/27/2006  . GERD 03/02/2006    Past Surgical History:  Procedure Laterality Date  . CESAREAN SECTION  12/14/2011   Procedure: CESAREAN SECTION;  Surgeon: Lahoma Crocker, MD;  Location: [facility];  Service: Obstetrics;  Laterality: N/A;  Primary Cesarean Section Delivery Girl @ 0616, Apgars     OB History    Gravida  1   Para  1   Term  1   Preterm  0   AB  0   Living  1     SAB  0   TAB  0   Ectopic  0   Multiple      Live Births  1            Home Medications    Prior to Admission medications   Medication Sig Start Date End Date Taking? Authorizing Provider  Accu-Chek FastClix Lancets MISC Use twice per day to check your blood glucose.  E11.65 09/18/18   Jose Persia, MD  cephALEXin (KEFLEX) 500 MG  capsule Take 1 capsule (500 mg total) by mouth 2 (two) times daily for 5 days. 10/09/18 10/14/18  Ovide Dusek, PA-C  diclofenac sodium (VOLTAREN) 1 % GEL Apply 2 g topically 4 (four) times daily. 10/09/18   Djuna Frechette, PA-C  gabapentin (NEURONTIN) 300 MG capsule Take 300 mg by mouth at bedtime. 05/07/18   [provider]  glucose blood (ACCU-CHEK GUIDE) test strip Use one strip, twice daily to check your blood glucose.  E11.65 09/18/18   Jose Persia, MD  pioglitazone (ACTOS) 30 MG tablet Take 1 tablet (30 mg total) by mouth daily. 09/18/18   Jose Persia, MD  potassium chloride (K-DUR) 10 MEQ tablet Take 1 tablet (10 mEq total) by mouth daily. 09/18/18   Jose Persia, MD  sitaGLIPtin (JANUVIA)  100 MG tablet Take 1 tablet (100 mg total) by mouth daily. 06/25/18   Neva Seat, MD  traZODone (DESYREL) 50 MG tablet Take 1 tablet (50 mg total) by mouth at bedtime. 09/18/18   Jose Persia, MD    Family History Family History  Problem Relation Age of Onset  . Diabetes Mother   . Hypertension Mother   . Stomach cancer Mother        diet at 73  . Heart disease Father        diet at 62  . Diabetes Sister   . Hypertension Sister   . Diabetes Brother   . Cancer Maternal Grandmother   . Cancer Paternal Grandmother     Social History Social History   Tobacco Use  . Smoking status: Current Every Day Smoker    Packs/day: 0.50    Types: Cigarettes  . Smokeless tobacco: Never Used  . Tobacco comment: 0.5ppd  Substance Use Topics  . Alcohol use: Yes    Comment: social  . Drug use: Never     Allergies   Patient has no known allergies.   Review of Systems Review of Systems  Respiratory: Positive for chest tightness (pressure).   Cardiovascular: Positive for leg swelling (leg and arm swelling).  Gastrointestinal: Positive for abdominal pain.  All other systems reviewed and are negative.    Physical Exam Updated Vital Signs BP 131/79   Pulse 84   Temp 99.1 F (37.3 C) (Oral)   Resp 12   Ht 6' 1"  (1.854 m)   Wt 120.2 kg   LMP 09/12/2018 Comment: pt shielded  SpO2 100%   BMI 34.96 kg/m   Physical Exam Vitals signs and nursing note reviewed.  Constitutional:      General: She is not in acute distress.    Appearance: She is well-developed.     Comments: Obese female resting comfortably in the bed in NAD  HENT:     Head: Normocephalic and atraumatic.  Eyes:     Extraocular Movements: Extraocular movements intact.     Conjunctiva/sclera: Conjunctivae normal.     Pupils: Pupils are equal, round, and reactive to light.  Neck:     Musculoskeletal: Normal range of motion and neck supple.  Cardiovascular:     Rate and Rhythm: Normal rate and regular  rhythm.     Pulses: Normal pulses.  Pulmonary:     Effort: Pulmonary effort is normal. No respiratory distress.     Breath sounds: Normal breath sounds. No wheezing.     Comments: TTP of the anterior chest wall. Speaking in full sentences. Clear lung sounds in all fields.  Chest:     Chest wall: Tenderness present.  Abdominal:  General: There is no distension.     Palpations: Abdomen is soft. There is no mass.     Tenderness: There is abdominal tenderness. There is no guarding or rebound.     Comments: Mild ttp of LLQ and LUQ abd. No rigidity, guarding, distention. Negative rebound.   Musculoskeletal: Normal range of motion.     Right lower leg: Edema present.     Left lower leg: Edema present.     Comments: Mild bilateral lower extremity edema without pitting.  Skin:    General: Skin is warm and dry.     Capillary Refill: Capillary refill takes less than 2 seconds.  Neurological:     Mental Status: She is alert and oriented to person, place, and time.      ED Treatments / Results  Labs (all labs ordered are listed, but only abnormal results are displayed) Labs Reviewed  COMPREHENSIVE METABOLIC PANEL - Abnormal; Notable for the following components:      Result Value   Glucose, Bld 130 (*)    All other components within normal limits  CBC - Abnormal; Notable for the following components:   WBC 13.8 (*)    All other components within normal limits  URINALYSIS, ROUTINE W REFLEX MICROSCOPIC - Abnormal; Notable for the following components:   APPearance HAZY (*)    Hgb urine dipstick SMALL (*)    Protein, ur 30 (*)    Nitrite POSITIVE (*)    Bacteria, UA MANY (*)    All other components within normal limits  URINE CULTURE  LIPASE, BLOOD  BRAIN NATRIURETIC PEPTIDE  MAGNESIUM  I-STAT BETA HCG BLOOD, ED ([facility], [facility], [facility] ONLY)  I-STAT BETA HCG BLOOD, ED ([facility], [facility], [facility] ONLY)  TROPONIN I (HIGH SENSITIVITY)  TROPONIN I (HIGH SENSITIVITY)    EKG EKG Interpretation  Date/Time:   Tuesday October 08 2018 21:23:24 EDT Ventricular Rate:  104 PR Interval:  112 QRS Duration: 70 QT Interval:  340 QTC Calculation: 447 R Axis:   3 Text Interpretation:  Sinus tachycardia Otherwise normal ECG Confirmed by Thayer Jew [phone]) on 10/09/2018 5:24:34 AM   Radiology Dg Chest 2 View  Result Date: 10/08/2018 CLINICAL DATA:  Left chest pain for 4 days. EXAM: CHEST - 2 VIEW COMPARISON:  August 06, 2018 FINDINGS: The heart size and mediastinal contours are within normal limits. Both lungs are clear. The visualized skeletal structures are unremarkable. IMPRESSION: No active cardiopulmonary disease. Electronically Signed   By: Abelardo Diesel M.D.   On: 10/08/2018 21:49    Procedures Procedures (including critical care time)  Medications Ordered in ED Medications  sodium chloride flush (NS) 0.9 % injection 3 mL (3 mLs Intravenous Given 10/09/18 0543)  ketorolac (TORADOL) 15 MG/ML injection 15 mg (15 mg Intravenous Given 10/09/18 0539)  furosemide (LASIX) injection 20 mg (20 mg Intravenous Given 10/09/18 0540)     Initial Impression / Assessment and Plan / ED Course  I have reviewed the triage vital signs and the nursing notes.  Pertinent labs & imaging results that were available during my care of the patient were reviewed by me and considered in my medical decision making (see chart for details).        Patient presenting for evaluation of chest pain and abdominal pain.  Physical examination, she appears nontoxic.  Chest pain is intermittent and has been going on for several days.  Described as a heaviness.  Has had negative cardiac work-up several days ago.  Pain is very reproducible with  palpation of the chest wall, likely MSK.  Additionally, patient with lower abdominal pain, although this also is intermittent.  Not consistent with diverticulitis.  Low suspicion for intra-abdominal infection, perforation, obstruction, surgical abdomen.  I do not believe she needs CT.  Perc  negative, doubt PE. Labs obtained from triage show slight leukocytosis at 14.  Otherwise reassuring.  Delta troponins negative.  Urine with positive nitrites and many bacteria.  Will send for culture, but considering lower abdominal pain and generalized body discomfort, will treat for UTI.  As patient has swelling, will order BNP to ensure no sign of heart failure.  Toradol for pain.  Will give small dose of Lasix in case patient does have elevated BNP.  BNP normal at 28.  On reassessment, patient reports pain is improved with Toradol.  Discussed treatment with antibiotics for UTI and Voltaren gel for chest pain.  Encourage close follow-up with PCP.  At this time, patient appears safe for discharge.  Return precautions given.  Patient states she understands and agrees to plan.  Final Clinical Impressions(s) / ED Diagnoses   Final diagnoses:  Acute cystitis without hematuria  Atypical chest pain    ED Discharge Orders         Ordered    diclofenac sodium (VOLTAREN) 1 % GEL  4 times daily     10/09/18 0622    cephALEXin (KEFLEX) 500 MG capsule  2 times daily     10/09/18 0622           Franchot Heidelberg, PA-C 10/09/18 0631    Franchot Heidelberg, PA-C 10/09/18 [id number]    Merryl Hacker, MD 10/09/18 [phone]

## 2018-10-09 NOTE — ED Notes (Signed)
Sent a urine culture with the urine specimen

## 2018-10-10 DIAGNOSIS — F3289 Other specified depressive episodes: Secondary | ICD-10-CM | POA: Diagnosis not present

## 2018-10-10 DIAGNOSIS — K5900 Constipation, unspecified: Secondary | ICD-10-CM | POA: Diagnosis not present

## 2018-10-10 DIAGNOSIS — E662 Morbid (severe) obesity with alveolar hypoventilation: Secondary | ICD-10-CM | POA: Diagnosis not present

## 2018-10-10 DIAGNOSIS — G4730 Sleep apnea, unspecified: Secondary | ICD-10-CM | POA: Diagnosis not present

## 2018-10-10 LAB — URINE CULTURE

## 2018-10-15 ENCOUNTER — Ambulatory Visit: Payer: Medicaid Other | Admitting: Licensed Clinical Social Worker

## 2018-10-16 ENCOUNTER — Other Ambulatory Visit (HOSPITAL_BASED_OUTPATIENT_CLINIC_OR_DEPARTMENT_OTHER): Payer: Self-pay

## 2018-10-16 DIAGNOSIS — G4730 Sleep apnea, unspecified: Secondary | ICD-10-CM

## 2018-10-17 DIAGNOSIS — E1169 Type 2 diabetes mellitus with other specified complication: Secondary | ICD-10-CM | POA: Diagnosis not present

## 2018-10-17 DIAGNOSIS — I10 Essential (primary) hypertension: Secondary | ICD-10-CM | POA: Diagnosis not present

## 2018-10-21 ENCOUNTER — Other Ambulatory Visit (HOSPITAL_COMMUNITY): Admission: RE | Admit: 2018-10-21 | Payer: Medicaid Other | Source: Ambulatory Visit

## 2018-10-23 ENCOUNTER — Ambulatory Visit: Payer: Medicaid Other | Admitting: Licensed Clinical Social Worker

## 2018-10-23 ENCOUNTER — Ambulatory Visit (INDEPENDENT_AMBULATORY_CARE_PROVIDER_SITE_OTHER): Payer: Medicaid Other | Admitting: Licensed Clinical Social Worker

## 2018-10-23 ENCOUNTER — Encounter: Payer: Self-pay | Admitting: Internal Medicine

## 2018-10-23 ENCOUNTER — Encounter: Payer: Self-pay | Admitting: Licensed Clinical Social Worker

## 2018-10-23 DIAGNOSIS — Z789 Other specified health status: Secondary | ICD-10-CM

## 2018-10-23 NOTE — BH Specialist Note (Signed)
Integrated Behavioral Health Visit via Telemedicine (Telephone)  10/23/2018 KERSTIN CRUSOE [id number]   Session Start time: 1:30  Session End time: 1:40 Total time: 10 minutes  Referring Provider: Dr. Charleen Kirks Type of Visit: Telephonic Patient location: Home [facility] Provider location: Office All persons participating in visit: Patient and [facility]  Confirmed patient's address: Yes  Confirmed patient's phone number: Yes  Any changes to demographics: No   Discussed confidentiality: Yes    The following statements were read to the patient and/or legal guardian that are established with the [facility] Provider.  "The purpose of this phone visit is to provide behavioral health care while limiting exposure to the coronavirus (COVID19).  There is a possibility of technology failure and discussed alternative modes of communication if that failure occurs."  "By engaging in this telephone visit, you consent to the provision of healthcare.  Additionally, you authorize for your insurance to be billed for the services provided during this telephone visit."   Patient and/or legal guardian consented to telephone visit: Yes   INTERVENTIONS: Interventions utilized:  Link to [employer]  ASSESSMENT: Patient used the phone session to discuss referrals for psychiatry. Patient reported that she currently is looking for a referral to a psychiatrist, and is not interested in therapy at this time. Two options were offered to the patient for psychiatrist that accept her insurance. Patient chose to be referred to [facility] in [place]. Patient was provided the information, and encouraged to set up a future appointment. Patient was informed that she could choose to attend therapy in our office at any time.     PLAN: 1. Follow up with behavioral health clinician on : prn 2. Referral(s): [place], [place]

## 2018-10-24 ENCOUNTER — Other Ambulatory Visit: Payer: Self-pay

## 2018-10-24 ENCOUNTER — Encounter (HOSPITAL_BASED_OUTPATIENT_CLINIC_OR_DEPARTMENT_OTHER): Payer: Medicaid Other | Admitting: Internal Medicine

## 2018-10-24 DIAGNOSIS — H538 Other visual disturbances: Secondary | ICD-10-CM | POA: Diagnosis not present

## 2018-10-24 DIAGNOSIS — H04123 Dry eye syndrome of bilateral lacrimal glands: Secondary | ICD-10-CM | POA: Diagnosis not present

## 2018-10-24 DIAGNOSIS — H0288B Meibomian gland dysfunction left eye, upper and lower eyelids: Secondary | ICD-10-CM | POA: Diagnosis not present

## 2018-10-24 DIAGNOSIS — E119 Type 2 diabetes mellitus without complications: Secondary | ICD-10-CM | POA: Diagnosis not present

## 2018-10-24 DIAGNOSIS — H0288A Meibomian gland dysfunction right eye, upper and lower eyelids: Secondary | ICD-10-CM | POA: Diagnosis not present

## 2018-10-24 DIAGNOSIS — H1045 Other chronic allergic conjunctivitis: Secondary | ICD-10-CM | POA: Diagnosis not present

## 2018-10-24 LAB — HM DIABETES EYE EXAM

## 2018-11-05 ENCOUNTER — Ambulatory Visit: Payer: Medicaid Other | Admitting: Dietician

## 2018-11-05 ENCOUNTER — Encounter: Payer: Medicaid Other | Admitting: Internal Medicine

## 2018-11-05 ENCOUNTER — Encounter: Payer: Self-pay | Admitting: Internal Medicine

## 2018-11-05 NOTE — Addendum Note (Signed)
Addended by: Truddie Crumble on: 11/05/2018 03:13 PM   Modules accepted: Orders

## 2018-11-11 ENCOUNTER — Encounter: Payer: Self-pay | Admitting: Physical Medicine and Rehabilitation

## 2018-11-11 DIAGNOSIS — E1169 Type 2 diabetes mellitus with other specified complication: Secondary | ICD-10-CM | POA: Diagnosis not present

## 2018-11-11 DIAGNOSIS — I10 Essential (primary) hypertension: Secondary | ICD-10-CM | POA: Diagnosis not present

## 2018-11-22 ENCOUNTER — Encounter: Payer: Self-pay | Admitting: *Deleted

## 2018-11-25 ENCOUNTER — Encounter
Payer: Medicaid Other | Attending: Physical Medicine and Rehabilitation | Admitting: Physical Medicine and Rehabilitation

## 2018-11-25 ENCOUNTER — Other Ambulatory Visit: Payer: Self-pay

## 2018-11-25 ENCOUNTER — Encounter: Payer: Self-pay | Admitting: Physical Medicine and Rehabilitation

## 2018-11-25 VITALS — BP 135/83 | HR 83 | Temp 97.5°F | Ht 73.0 in | Wt 267.0 lb

## 2018-11-25 DIAGNOSIS — M545 Low back pain, unspecified: Secondary | ICD-10-CM

## 2018-11-25 DIAGNOSIS — M7918 Myalgia, other site: Secondary | ICD-10-CM

## 2018-11-25 DIAGNOSIS — M542 Cervicalgia: Secondary | ICD-10-CM

## 2018-11-25 DIAGNOSIS — M792 Neuralgia and neuritis, unspecified: Secondary | ICD-10-CM | POA: Diagnosis not present

## 2018-11-25 DIAGNOSIS — E1165 Type 2 diabetes mellitus with hyperglycemia: Secondary | ICD-10-CM | POA: Diagnosis not present

## 2018-11-25 MED ORDER — DULOXETINE HCL 30 MG PO CPEP: 30.0000 mg | ORAL_CAPSULE | Freq: Every day | ORAL | 2 refills | Status: DC

## 2018-11-25 NOTE — Patient Instructions (Addendum)
Patient is a 38 yr old L handed female with DMII with cervical and lumbar pain- A1c 10.2   1.  Duloxetine 30 mg nightly  1 week then 60 mg nightly- (kidney function in 10/20 was perfect)- for nerve pain- can add to Zoloft current dose.  1% of people will have nausea- can give her something for it, if needed.   2. Theracane- can get online- Amazon $25-30 - hold pressure 3-4 minutes on each tight muscle- HOLD PRESSURE- youtube videos to see how to use theracane  3. Will get pt back for trigger point injections- in the next week or so. NOTHING IN YOUR SPINE!!!  4.  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  5. Went over nerve pain, myofascial pain- and how it's causing her back pain-   6. Will have back in ~ 1 week for trigger point injections.

## 2018-11-25 NOTE — Progress Notes (Signed)
Subjective:    Patient ID: Misty [name], female    DOB: [birth year], 38 y.o.   MRN: [id number]  HPI  [name] is a 38 yr old L handed female with DMII on insulin and is here for neck and back pain , her evaluation.   Has had pain since 06/2017 - hurt on the job- since then things have gotten worse.   Was seeing Ortho- [facility] [address]- [address] and did PT with them.   Did MRIs of lumbar spine and maybe cervical spine April 2020- And maybe 01/2018.   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 Was sent to [facility] then [facility].  Sometimes aches, sometimes burns and sometimes like someone stabbing her- can shoot down into RLE-   Neck pain will come down and into L shoulder and radiate into L hand- whole hand and gets tingly- all fingers get numby/tingly.  Tried- PT- felt was waste of time NSAIDs- didn't do anything either Back patches- lidocaine?-  Robaxin- didn't help at all Flexeril- felt like taking candy Not for sure if tried nerve pain meds as of yet. Lyrica/Cymbalta don't sound familiar.   Certain positions will ease it for a few minutes. But nothing all the time.   Epidural steroid injections were also not helpful.  DM- BGs 140s on average- have been running well Overall- A1c is 10.2- 2 weeks ago- Got butt in gear and brought them down last 2 weeks.   Sometimes feels like legs are "crawling" - can be anytime- no specific position or time of day.  Social Hx: Was working as Quarry manager- hasn't worked- been on [employer] comp since then 1/2ppd-2ppd if pain bad   Pain Inventory Average Pain 7 Pain Right Now 7 My pain is constant, sharp, burning and stabbing  In the last 24 hours, has pain interfered with the following? General activity 6 Relation with others 4 Enjoyment of life 7 What TIME of day is your pain at its worst? daytime. night Sleep (in general) Poor  Pain is worse with: walking,  bending, sitting and standing Pain improves with: medication Relief from Meds: 1  Mobility walk without assistance ability to climb steps?  yes do you drive?  yes transfers alone  Function not employed: date last employed . I need assistance with the following:  dressing, bathing, meal prep, household duties and shopping  Neuro/Psych weakness numbness tingling trouble walking spasms depression anxiety  Prior Studies new  Physicians involved in your care new   Family History  Problem Relation Age of Onset  . Diabetes Mother   . Hypertension Mother   . Stomach cancer Mother        diet at 36  . Heart disease Father        diet at 16  . Diabetes Sister   . Hypertension Sister   . Diabetes Brother   . Cancer Maternal Grandmother   . Cancer Paternal Grandmother    Social History   Socioeconomic History  . Marital status: Single    Spouse name: Not on file  . Number of children: Not on file  . Years of education: Not on file  . Highest education level: Not on file  Occupational History  . Not on file  Social Needs  . Financial resource strain: Somewhat hard  . Food insecurity    Worry: Sometimes true    Inability: Sometimes true  . Transportation  needs    Medical: No    Non-medical: No  Tobacco Use  . Smoking status: Current Every Day Smoker    Packs/day: 0.50    Types: Cigarettes  . Smokeless tobacco: Never Used  . Tobacco comment: 0.5ppd  Substance and Sexual Activity  . Alcohol use: Yes    Comment: social  . Drug use: Never  . Sexual activity: Yes  Lifestyle  . Physical activity    Days per week: 1 day    Minutes per session: 10 min  . Stress: Very much  Relationships  . Social connections    Talks on phone: More than three times a week    Gets together: More than three times a week    Attends religious service: Never    Active member of club or organization: No    Attends meetings of clubs or organizations: Never    Relationship  status: Never married  Other Topics Concern  . Not on file  Social History Narrative   ** Merged History Encounter **       Past Surgical History:  Procedure Laterality Date  . CESAREAN SECTION  12/14/2011   Procedure: CESAREAN SECTION;  Surgeon: Misty Crocker, MD;  Location: [facility];  Service: Obstetrics;  Laterality: N/A;  Primary Cesarean Section Delivery Girl @ [phone], Apgars   Past Medical History:  Diagnosis Date  . Chest pain    multiple ED evaluations, most likely musculoskeletal (cervical radiculopathy), needs MRI spine  . Diabetes mellitus   . Diabetes mellitus without complication ([place])   . Diverticulitis 2019  . GERD (gastroesophageal reflux disease)   . Gestational diabetes   . Hidradenitis suppurativa 01/18/2018  . PCOD (polycystic ovarian disease)    [facility]ed by [facility], has been on metformin  . PCOS (polycystic ovarian syndrome)    BP 135/83   Pulse 83   Temp (!) 97.5 F (36.4 C)   Ht 6' 1"  (1.854 m)   Wt 267 lb (121.1 kg)   SpO2 98%   BMI 35.23 kg/m   Opioid Risk Score:   Fall Risk Score:  `1  Depression screen PHQ 2/9  Depression screen [facility] 2/9 11/25/2018 06/25/2018 01/17/2018 10/03/2017 10/03/2017 08/17/2017 02/28/2017  Decreased Interest 2 1 1  - 1 3 0  Down, Depressed, Hopeless 3 1 1  - 1 3 0  PHQ - 2 Score 5 2 2  - 2 6 0  Altered sleeping 2 2 2 2 3 3  -  Tired, decreased energy 1 1 1 1  - 3 -  Change in appetite 1 0 0 - - 3 -  Feeling bad or failure about yourself  1 1 1 1  - 0 -  Trouble concentrating 2 1 1 1  - 0 -  Moving slowly or fidgety/restless 1 0 0 0 - 0 -  Suicidal thoughts 0 0 0 0 - 0 -  PHQ-9 Score 13 7 7  - 5 15 -  Difficult doing work/chores - Somewhat difficult Somewhat difficult Somewhat difficult - Somewhat difficult -    Review of Systems  Constitutional: Positive for appetite change and unexpected weight change.  HENT: Negative.   Eyes: Negative.   Respiratory: Negative.   Cardiovascular: Positive for leg swelling.   Gastrointestinal: Positive for constipation.  Endocrine:       High blood sugar  Musculoskeletal: Positive for back pain, neck pain and neck stiffness.  Skin: Negative.   Allergic/Immunologic: Negative.   Neurological: Positive for weakness and numbness.       Tingling  Hematological: Negative.   Psychiatric/Behavioral: Positive for dysphoric mood. The patient is nervous/anxious.        Objective:   Physical Exam  Awake, alert, appropriate, accompanied by [name], NAD MS: Deltoids, biceps, triceps, WE, grip and fingers 5/5 in UEs B/L LEs- HF, KE/KF/DF/PFs B/L in 5/5 no significant deficits  Severe trigger points palpated in levators, upper traps, scalenes, splenius capitus, pecs, and rhomboids- as well as lumbar paraspinals  R>L  Neuro:  Intact to light touch in all 4 extremities in all dermatomes checked- all dermatomes checked in each extremity.        Assessment & Plan:  Patient is a 37 yr old L handed female with DMII with cervical and lumbar pain- A1c 10.2   1.  Duloxetine 30 mg nightly  1 week then 60 mg nightly- (kidney function in 10/20 was perfect)- for nerve pain- can add to Zoloft current dose.  1% of people will have nausea- can give her something for it, if needed.   2. Theracane- can get online- Amazon $25-30 - hold pressure 3-4 minutes on each tight muscle- HOLD PRESSURE- youtube videos to see how to use theracane  3. Will get pt back for trigger point injections- in the next week or so. NOTHING IN YOUR SPINE!!!  4.  Look over MRI results when gets here- if anything else comes up, will call patient. 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   5. Went over nerve pain, myofascial pain- and how it's causing her back pain-   6. Will have back in ~ 1 week for trigger point injections.  I spent a total of 45 minutes on appointment more than 25 minutes educating pt about nerve pain, myofascial  pain and central sensitization.

## 2018-12-06 ENCOUNTER — Encounter
Payer: Medicaid Other | Attending: Physical Medicine and Rehabilitation | Admitting: Physical Medicine and Rehabilitation

## 2018-12-06 DIAGNOSIS — M7918 Myalgia, other site: Secondary | ICD-10-CM | POA: Insufficient documentation

## 2018-12-06 DIAGNOSIS — M545 Low back pain: Secondary | ICD-10-CM | POA: Insufficient documentation

## 2018-12-06 DIAGNOSIS — M792 Neuralgia and neuritis, unspecified: Secondary | ICD-10-CM | POA: Insufficient documentation

## 2018-12-06 DIAGNOSIS — M542 Cervicalgia: Secondary | ICD-10-CM | POA: Insufficient documentation

## 2018-12-06 DIAGNOSIS — E1165 Type 2 diabetes mellitus with hyperglycemia: Secondary | ICD-10-CM | POA: Insufficient documentation

## 2019-01-27 ENCOUNTER — Encounter (HOSPITAL_COMMUNITY): Payer: Self-pay

## 2019-01-27 ENCOUNTER — Ambulatory Visit (HOSPITAL_COMMUNITY)
Admission: EM | Admit: 2019-01-27 | Discharge: 2019-01-27 | Disposition: A | Discharge status: Home / Self Care | Payer: Medicaid Other | Attending: Family Medicine | Admitting: Family Medicine

## 2019-01-27 ENCOUNTER — Other Ambulatory Visit: Payer: Self-pay

## 2019-01-27 DIAGNOSIS — R0789 Other chest pain: Secondary | ICD-10-CM | POA: Diagnosis not present

## 2019-01-27 DIAGNOSIS — Z20822 Contact with and (suspected) exposure to covid-19: Secondary | ICD-10-CM | POA: Diagnosis not present

## 2019-01-27 DIAGNOSIS — R52 Pain, unspecified: Secondary | ICD-10-CM

## 2019-01-27 DIAGNOSIS — R6883 Chills (without fever): Secondary | ICD-10-CM | POA: Diagnosis not present

## 2019-01-27 DIAGNOSIS — R5383 Other fatigue: Secondary | ICD-10-CM

## 2019-01-27 NOTE — ED Provider Notes (Signed)
[place]    CSN: [id number] Arrival date & time: 01/27/19  5329      History   Chief Complaint Chief Complaint  Patient presents with  . Generalized Body Aches    HPI Misty [name] is a 39 y.o. female.   HPI  Patient states that she has body aches.  Fatigue.  Feels like her chest is tight.  She was exposed to her brother.  She got a call today that her brother is positive for Covid.  She is afraid that she has the same.  She has had some sweats or chills but did not take her temperature.  No shortness of breath.  No cough.  No runny nose or cold symptoms.  No loss of taste or smell.  She is eating and drinking normally. She is at increased risk for complications of Covid because of diabetes, cigarette smoking, obesity  Past Medical History:  Diagnosis Date  . Chest pain    multiple ED evaluations, most likely musculoskeletal (cervical radiculopathy), needs MRI spine  . Diabetes mellitus   . Diabetes mellitus without complication ([place])   . Diverticulitis 2019  . GERD (gastroesophageal reflux disease)   . Gestational diabetes   . Hidradenitis suppurativa 01/18/2018  . PCOD (polycystic ovarian disease)    Followed by [facility], has been on metformin  . PCOS (polycystic ovarian syndrome)     Patient Active Problem List   Diagnosis Date Noted  . Myofascial pain dysfunction syndrome 11/25/2018  . Nerve pain 11/25/2018  . Hypertension 09/19/2018  . Insomnia 09/19/2018  . Hematuria 09/19/2018  . Left ankle pain 06/25/2018  . Lumbar spine pain 06/25/2018  . Thoracic spine pain 06/25/2018  . Cervical spine pain 06/25/2018  . Hidradenitis suppurativa 01/18/2018  . History of diverticulitis 10/03/2017  . Constipation 10/03/2017  . 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 Morbid obesity ([place]) 08/17/2017  . Hypokalemia 08/17/2017  . Headache 02/28/2017  . Heart palpitations 06/04/2012  . Tobacco use 06/03/2012  . Preventative health care 05/03/2011  .  Diabetes type 2, uncontrolled ([place]) 02/24/2008  . Polycystic ovarian disease 07/27/2006  . GERD 03/02/2006    Past Surgical History:  Procedure Laterality Date  . CESAREAN SECTION  12/14/2011   Procedure: CESAREAN SECTION;  Surgeon: Lahoma Crocker, MD;  Location: [facility];  Service: Obstetrics;  Laterality: N/A;  Primary Cesarean Section Delivery Girl @ 0616, Apgars    OB History    Gravida  1   Para  1   Term  1   Preterm  0   AB  0   Living  1     SAB  0   TAB  0   Ectopic  0   Multiple      Live Births  1            Home Medications    Prior to Admission medications   Medication Sig Start Date End Date Taking? Authorizing Provider  Accu-Chek FastClix Lancets MISC Use twice per day to check your blood glucose.  E11.65 09/18/18   Jose Persia, MD  diclofenac sodium (VOLTAREN) 1 % GEL Apply 2 g topically 4 (four) times daily. 10/09/18   Caccavale, Sophia, PA-C  DULoxetine (CYMBALTA) 30 MG capsule Take 1 capsule (30 mg total) by mouth at bedtime. Take 1 tab nightly x 1 week then 2 tabs nightly- for nerve pain 11/25/18 11/25/19  Lovorn, Jinny Blossom, MD  gabapentin (NEURONTIN) 300 MG capsule Take 300 mg by mouth at bedtime.  05/07/18   [provider]  glucose blood (ACCU-CHEK GUIDE) test strip Use one strip, twice daily to check your blood glucose.  E11.65 09/18/18   Jose Persia, MD  methocarbamol (ROBAXIN) 500 MG tablet Take 500 mg by mouth 4 (four) times daily.    [provider]  pioglitazone (ACTOS) 30 MG tablet Take 1 tablet (30 mg total) by mouth daily. 09/18/18   Jose Persia, MD  potassium chloride (K-DUR) 10 MEQ tablet Take 1 tablet (10 mEq total) by mouth daily. 09/18/18   Jose Persia, MD  prazosin (MINIPRESS) 2 MG capsule Take 2 mg by mouth at bedtime.    [provider]  sertraline (ZOLOFT) 50 MG tablet Take 50 mg by mouth daily.    [provider]  sitaGLIPtin (JANUVIA) 100 MG tablet Take 1 tablet (100 mg total)  by mouth daily. 06/25/18   Neva Seat, MD  traZODone (DESYREL) 50 MG tablet Take 1 tablet (50 mg total) by mouth at bedtime. 09/18/18   Jose Persia, MD    Family History Family History  Problem Relation Age of Onset  . Diabetes Mother   . Hypertension Mother   . Stomach cancer Mother        diet at 19  . Heart disease Father        diet at 50  . Diabetes Sister   . Hypertension Sister   . Diabetes Brother   . Cancer Maternal Grandmother   . Cancer Paternal Grandmother     Social History Social History   Tobacco Use  . Smoking status: Current Every Day Smoker    Packs/day: 0.50    Types: Cigarettes  . Smokeless tobacco: Never Used  . Tobacco comment: 0.5ppd  Substance Use Topics  . Alcohol use: Yes    Comment: social  . Drug use: Never     Allergies   Patient has no known allergies.   Review of Systems Review of Systems  Constitutional: Positive for chills, fatigue and fever.  HENT: Negative for congestion.   Respiratory: Positive for chest tightness.   Musculoskeletal: Positive for myalgias.  Neurological: Positive for headaches.     Physical Exam Triage Vital Signs ED Triage Vitals  Enc Vitals Group     BP 01/27/19 1938 (!) 141/93     Pulse Rate 01/27/19 1938 99     Resp 01/27/19 1938 19     Temp 01/27/19 1938 98.6 F (37 C)     Temp Source 01/27/19 1938 Oral     SpO2 01/27/19 1938 96 %     Weight 01/27/19 1936 267 lb 6.4 oz (121.3 kg)     Height --      Head Circumference --      Peak Flow --      Pain Score 01/27/19 1936 0     Pain Loc --      Pain Edu? --      Excl. in [place]? --    No data found.  Updated Vital Signs BP (!) 141/93 (BP Location: Right Arm)   Pulse 99   Temp 98.6 F (37 C) (Oral)   Resp 19   Wt 121.3 kg   LMP 12/09/2018   SpO2 96%   BMI 35.28 kg/m      Physical Exam Constitutional:      General: She is not in acute distress.    Appearance: She is well-developed. She is obese.  HENT:     Head:  Normocephalic and atraumatic.  Mouth/Throat:     Comments: Mask is in place Eyes:     Conjunctiva/sclera: Conjunctivae normal.     Pupils: Pupils are equal, round, and reactive to light.  Cardiovascular:     Rate and Rhythm: Normal rate and regular rhythm.  Pulmonary:     Effort: Pulmonary effort is normal. No respiratory distress.     Breath sounds: Normal breath sounds. No wheezing or rales.  Abdominal:     General: There is no distension.     Palpations: Abdomen is soft.  Musculoskeletal:        General: Normal range of motion.     Cervical back: Normal range of motion and neck supple.  Skin:    General: Skin is warm and dry.  Neurological:     Mental Status: She is alert. Mental status is at baseline.  Psychiatric:        Mood and Affect: Mood normal.        Behavior: Behavior normal.      UC Treatments / Results  Labs (all labs ordered are listed, but only abnormal results are displayed) Labs Reviewed  NOVEL CORONAVIRUS, NAA (HOSP ORDER, SEND-OUT TO REF LAB; TAT 18-24 HRS)    EKG   Radiology No results found.  Procedures Procedures (including critical care time)  Medications Ordered in UC Medications - No data to display  Initial Impression / Assessment and Plan / UC Course  I have reviewed the triage vital signs and the nursing notes.  Pertinent labs & imaging results that were available during my care of the patient were reviewed by me and considered in my medical decision making (see chart for details).     Reviewed coronavirus.  Signs and symptoms.  Treatment.  Staying at home.  Reasons for return Final Clinical Impressions(s) / UC Diagnoses   Final diagnoses:  Close exposure to COVID-19 virus  Sensation of chest pressure     Discharge Instructions     Go home to rest Drink plenty of fluids Take Tylenol for pain or fever You may take over-the-counter cough and cold medicines as needed You must quarantine at home until your test result  is available You can check for your test result in MyChart    ED Prescriptions    None     PDMP not reviewed this encounter.   Raylene Everts, MD 01/27/19 2020

## 2019-01-27 NOTE — ED Triage Notes (Signed)
Pt states she got a call from her brother informing her he tested POSITIVE for COVID yesterday, Saturday was her last contact with her brother.

## 2019-01-27 NOTE — Discharge Instructions (Addendum)
Go home to rest Drink plenty of fluids Take Tylenol for pain or fever You may take over-the-counter cough and cold medicines as needed You must quarantine at home until your test result is available You can check for your test result in MyChart

## 2019-01-29 LAB — NOVEL CORONAVIRUS, NAA (HOSP ORDER, SEND-OUT TO REF LAB; TAT 18-24 HRS): SARS-CoV-2, NAA: NOT DETECTED

## 2019-07-03 DIAGNOSIS — Z419 Encounter for procedure for purposes other than remedying health state, unspecified: Secondary | ICD-10-CM | POA: Diagnosis not present

## 2019-07-15 DIAGNOSIS — Z1159 Encounter for screening for other viral diseases: Secondary | ICD-10-CM | POA: Diagnosis not present

## 2019-07-15 DIAGNOSIS — M542 Cervicalgia: Secondary | ICD-10-CM | POA: Diagnosis not present

## 2019-07-15 DIAGNOSIS — F17210 Nicotine dependence, cigarettes, uncomplicated: Secondary | ICD-10-CM | POA: Diagnosis not present

## 2019-07-15 DIAGNOSIS — Z79899 Other long term (current) drug therapy: Secondary | ICD-10-CM | POA: Diagnosis not present

## 2019-07-15 DIAGNOSIS — M25561 Pain in right knee: Secondary | ICD-10-CM | POA: Diagnosis not present

## 2019-07-15 DIAGNOSIS — M25511 Pain in right shoulder: Secondary | ICD-10-CM | POA: Diagnosis not present

## 2019-07-15 DIAGNOSIS — M545 Low back pain: Secondary | ICD-10-CM | POA: Diagnosis not present

## 2019-07-15 DIAGNOSIS — G8929 Other chronic pain: Secondary | ICD-10-CM | POA: Diagnosis not present

## 2019-07-15 DIAGNOSIS — E118 Type 2 diabetes mellitus with unspecified complications: Secondary | ICD-10-CM | POA: Diagnosis not present

## 2019-07-15 DIAGNOSIS — M25562 Pain in left knee: Secondary | ICD-10-CM | POA: Diagnosis not present

## 2019-07-15 DIAGNOSIS — M25512 Pain in left shoulder: Secondary | ICD-10-CM | POA: Diagnosis not present

## 2019-07-15 DIAGNOSIS — M129 Arthropathy, unspecified: Secondary | ICD-10-CM | POA: Diagnosis not present

## 2019-07-15 DIAGNOSIS — N926 Irregular menstruation, unspecified: Secondary | ICD-10-CM | POA: Diagnosis not present

## 2019-07-22 DIAGNOSIS — G8929 Other chronic pain: Secondary | ICD-10-CM | POA: Diagnosis not present

## 2019-07-22 DIAGNOSIS — F17210 Nicotine dependence, cigarettes, uncomplicated: Secondary | ICD-10-CM | POA: Diagnosis not present

## 2019-07-22 DIAGNOSIS — M545 Low back pain: Secondary | ICD-10-CM | POA: Diagnosis not present

## 2019-07-22 DIAGNOSIS — E118 Type 2 diabetes mellitus with unspecified complications: Secondary | ICD-10-CM | POA: Diagnosis not present

## 2019-07-22 DIAGNOSIS — N926 Irregular menstruation, unspecified: Secondary | ICD-10-CM | POA: Diagnosis not present

## 2019-07-22 DIAGNOSIS — Z79899 Other long term (current) drug therapy: Secondary | ICD-10-CM | POA: Diagnosis not present

## 2019-07-24 DIAGNOSIS — Z111 Encounter for screening for respiratory tuberculosis: Secondary | ICD-10-CM | POA: Diagnosis not present

## 2019-07-29 DIAGNOSIS — Z1159 Encounter for screening for other viral diseases: Secondary | ICD-10-CM | POA: Diagnosis not present

## 2019-07-29 DIAGNOSIS — E1165 Type 2 diabetes mellitus with hyperglycemia: Secondary | ICD-10-CM | POA: Diagnosis not present

## 2019-07-29 DIAGNOSIS — Z20822 Contact with and (suspected) exposure to covid-19: Secondary | ICD-10-CM | POA: Diagnosis not present

## 2019-07-29 DIAGNOSIS — N926 Irregular menstruation, unspecified: Secondary | ICD-10-CM | POA: Diagnosis not present

## 2019-07-29 DIAGNOSIS — Z7251 High risk heterosexual behavior: Secondary | ICD-10-CM | POA: Diagnosis not present

## 2019-07-29 DIAGNOSIS — Z0000 Encounter for general adult medical examination without abnormal findings: Secondary | ICD-10-CM | POA: Diagnosis not present

## 2019-07-29 DIAGNOSIS — R0602 Shortness of breath: Secondary | ICD-10-CM | POA: Diagnosis not present

## 2019-07-29 DIAGNOSIS — Z114 Encounter for screening for human immunodeficiency virus [HIV]: Secondary | ICD-10-CM | POA: Diagnosis not present

## 2019-07-29 DIAGNOSIS — R5383 Other fatigue: Secondary | ICD-10-CM | POA: Diagnosis not present

## 2019-07-29 DIAGNOSIS — F17210 Nicotine dependence, cigarettes, uncomplicated: Secondary | ICD-10-CM | POA: Diagnosis not present

## 2019-07-29 DIAGNOSIS — E559 Vitamin D deficiency, unspecified: Secondary | ICD-10-CM | POA: Diagnosis not present

## 2019-07-30 DIAGNOSIS — M545 Low back pain: Secondary | ICD-10-CM | POA: Diagnosis not present

## 2019-08-03 DIAGNOSIS — Z419 Encounter for procedure for purposes other than remedying health state, unspecified: Secondary | ICD-10-CM | POA: Diagnosis not present

## 2019-08-05 DIAGNOSIS — E559 Vitamin D deficiency, unspecified: Secondary | ICD-10-CM | POA: Diagnosis not present

## 2019-08-05 DIAGNOSIS — E785 Hyperlipidemia, unspecified: Secondary | ICD-10-CM | POA: Diagnosis not present

## 2019-08-05 DIAGNOSIS — R05 Cough: Secondary | ICD-10-CM | POA: Diagnosis not present

## 2019-08-05 DIAGNOSIS — M19011 Primary osteoarthritis, right shoulder: Secondary | ICD-10-CM | POA: Diagnosis not present

## 2019-08-05 DIAGNOSIS — Z20822 Contact with and (suspected) exposure to covid-19: Secondary | ICD-10-CM | POA: Diagnosis not present

## 2019-08-05 DIAGNOSIS — N926 Irregular menstruation, unspecified: Secondary | ICD-10-CM | POA: Diagnosis not present

## 2019-08-05 DIAGNOSIS — E119 Type 2 diabetes mellitus without complications: Secondary | ICD-10-CM | POA: Diagnosis not present

## 2019-08-05 DIAGNOSIS — F17210 Nicotine dependence, cigarettes, uncomplicated: Secondary | ICD-10-CM | POA: Diagnosis not present

## 2019-08-12 DIAGNOSIS — Z7189 Other specified counseling: Secondary | ICD-10-CM | POA: Diagnosis not present

## 2019-08-12 DIAGNOSIS — U071 COVID-19: Secondary | ICD-10-CM | POA: Diagnosis not present

## 2019-08-12 DIAGNOSIS — F17210 Nicotine dependence, cigarettes, uncomplicated: Secondary | ICD-10-CM | POA: Diagnosis not present

## 2019-08-12 DIAGNOSIS — R05 Cough: Secondary | ICD-10-CM | POA: Diagnosis not present

## 2019-08-12 DIAGNOSIS — Z9189 Other specified personal risk factors, not elsewhere classified: Secondary | ICD-10-CM | POA: Diagnosis not present

## 2019-08-19 DIAGNOSIS — R9431 Abnormal electrocardiogram [ECG] [EKG]: Secondary | ICD-10-CM | POA: Diagnosis not present

## 2019-08-20 DIAGNOSIS — R002 Palpitations: Secondary | ICD-10-CM | POA: Diagnosis not present

## 2019-08-20 DIAGNOSIS — E785 Hyperlipidemia, unspecified: Secondary | ICD-10-CM | POA: Diagnosis not present

## 2019-09-03 DIAGNOSIS — Z419 Encounter for procedure for purposes other than remedying health state, unspecified: Secondary | ICD-10-CM | POA: Diagnosis not present

## 2019-09-05 DIAGNOSIS — M47812 Spondylosis without myelopathy or radiculopathy, cervical region: Secondary | ICD-10-CM | POA: Diagnosis not present

## 2019-09-05 DIAGNOSIS — R209 Unspecified disturbances of skin sensation: Secondary | ICD-10-CM | POA: Diagnosis not present

## 2019-09-05 DIAGNOSIS — Z79899 Other long term (current) drug therapy: Secondary | ICD-10-CM | POA: Diagnosis not present

## 2019-09-05 DIAGNOSIS — M545 Low back pain: Secondary | ICD-10-CM | POA: Diagnosis not present

## 2019-09-05 DIAGNOSIS — M19011 Primary osteoarthritis, right shoulder: Secondary | ICD-10-CM | POA: Diagnosis not present

## 2019-09-05 DIAGNOSIS — N926 Irregular menstruation, unspecified: Secondary | ICD-10-CM | POA: Diagnosis not present

## 2019-09-05 DIAGNOSIS — F17210 Nicotine dependence, cigarettes, uncomplicated: Secondary | ICD-10-CM | POA: Diagnosis not present

## 2019-09-05 DIAGNOSIS — E118 Type 2 diabetes mellitus with unspecified complications: Secondary | ICD-10-CM | POA: Diagnosis not present

## 2019-10-03 DIAGNOSIS — Z419 Encounter for procedure for purposes other than remedying health state, unspecified: Secondary | ICD-10-CM | POA: Diagnosis not present

## 2019-10-09 DIAGNOSIS — R209 Unspecified disturbances of skin sensation: Secondary | ICD-10-CM | POA: Diagnosis not present

## 2019-10-09 DIAGNOSIS — E118 Type 2 diabetes mellitus with unspecified complications: Secondary | ICD-10-CM | POA: Diagnosis not present

## 2019-10-09 DIAGNOSIS — Z79899 Other long term (current) drug therapy: Secondary | ICD-10-CM | POA: Diagnosis not present

## 2019-10-09 DIAGNOSIS — F17210 Nicotine dependence, cigarettes, uncomplicated: Secondary | ICD-10-CM | POA: Diagnosis not present

## 2019-10-09 DIAGNOSIS — N926 Irregular menstruation, unspecified: Secondary | ICD-10-CM | POA: Diagnosis not present

## 2019-10-09 DIAGNOSIS — M545 Low back pain, unspecified: Secondary | ICD-10-CM | POA: Diagnosis not present

## 2019-10-09 DIAGNOSIS — M19011 Primary osteoarthritis, right shoulder: Secondary | ICD-10-CM | POA: Diagnosis not present

## 2019-10-09 DIAGNOSIS — M47812 Spondylosis without myelopathy or radiculopathy, cervical region: Secondary | ICD-10-CM | POA: Diagnosis not present

## 2019-10-20 ENCOUNTER — Encounter (HOSPITAL_BASED_OUTPATIENT_CLINIC_OR_DEPARTMENT_OTHER): Payer: Medicaid Other | Attending: Internal Medicine | Admitting: Internal Medicine

## 2019-10-20 ENCOUNTER — Other Ambulatory Visit: Payer: Self-pay

## 2019-10-20 DIAGNOSIS — E11621 Type 2 diabetes mellitus with foot ulcer: Secondary | ICD-10-CM | POA: Diagnosis not present

## 2019-10-20 DIAGNOSIS — E669 Obesity, unspecified: Secondary | ICD-10-CM | POA: Diagnosis not present

## 2019-10-20 DIAGNOSIS — Z6834 Body mass index (BMI) 34.0-34.9, adult: Secondary | ICD-10-CM | POA: Insufficient documentation

## 2019-10-20 DIAGNOSIS — F17210 Nicotine dependence, cigarettes, uncomplicated: Secondary | ICD-10-CM | POA: Insufficient documentation

## 2019-10-20 DIAGNOSIS — I10 Essential (primary) hypertension: Secondary | ICD-10-CM | POA: Diagnosis not present

## 2019-10-20 DIAGNOSIS — L97518 Non-pressure chronic ulcer of other part of right foot with other specified severity: Secondary | ICD-10-CM | POA: Diagnosis not present

## 2019-10-20 DIAGNOSIS — E1142 Type 2 diabetes mellitus with diabetic polyneuropathy: Secondary | ICD-10-CM | POA: Diagnosis not present

## 2019-10-20 DIAGNOSIS — E119 Type 2 diabetes mellitus without complications: Secondary | ICD-10-CM | POA: Diagnosis not present

## 2019-10-20 DIAGNOSIS — L97429 Non-pressure chronic ulcer of left heel and midfoot with unspecified severity: Secondary | ICD-10-CM | POA: Diagnosis present

## 2019-10-20 DIAGNOSIS — M25572 Pain in left ankle and joints of left foot: Secondary | ICD-10-CM | POA: Diagnosis not present

## 2019-10-20 DIAGNOSIS — M25571 Pain in right ankle and joints of right foot: Secondary | ICD-10-CM | POA: Diagnosis not present

## 2019-10-20 NOTE — Progress Notes (Signed)
Misty [name], Misty [name] ([id number]) Visit Report for 10/20/2019 Abuse/Suicide Risk Screen Details Patient Name: Date of Service: Misty [name], Misty [name] 10/20/2019 2:45 PM Medical Record Number: [id number] Patient Account Number: [id number] Date of Birth/Sex: Treating RN: 1980/07/07 (39 y.o. Elam Dutch Primary Care Emilynn Srinivasan: Jose Persia Other Clinician: Referring Bonniejean Piano: Treating Bobbyjo Marulanda/Extender: [place] April in Treatment: 0 Abuse/Suicide Risk Screen Items Answer ABUSE RISK SCREEN: Has anyone close to you tried to hurt or harm you recentlyo No Do you feel uncomfortable with anyone in your familyo No Has anyone forced you do things that you didnt want to doo No Electronic Signature(s) Signed: 10/20/2019 5:10:38 PM By: Baruch Gouty RN, BSN Entered By: Baruch Gouty on 10/20/2019 15:09:57 -------------------------------------------------------------------------------- Activities of Daily Living Details Patient Name: Date of Service: Misty [name], Misty [name] 10/20/2019 2:45 PM Medical Record Number: [id number] Patient Account Number: [id number] Date of Birth/Sex: Treating RN: [birth year] (39 y.o. Elam Dutch Primary Care Satrina Magallanes: Jose Persia Other Clinician: Referring Saurabh Hettich: Treating Giann Obara/Extender: [place] April in Treatment: 0 Activities of Daily Living Items Answer Activities of Daily Living (Please select one for each item) Drive Automobile Completely Able T Medications ake Completely Able Use T elephone Completely Able Care for Appearance Completely Able Use T oilet Completely Able Bath / Shower Completely Able Dress Self Completely Able Feed Self Completely Able Walk Completely Able Get In / Out Bed Completely Able Housework Completely Able Prepare Meals Completely [place] for Self Completely Able Electronic Signature(s) Signed:  10/20/2019 5:10:38 PM By: Baruch Gouty RN, BSN Entered By: Baruch Gouty on 10/20/2019 15:10:28 -------------------------------------------------------------------------------- Education Screening Details Patient Name: Date of Service: Misty [name], Misty R. 10/20/2019 2:45 PM Medical Record Number: [id number] Patient Account Number: [id number] Date of Birth/Sex: Treating RN: [birth year] (39 y.o. Elam Dutch Primary Care Markesha Hannig: Jose Persia Other Clinician: Referring Phallon Haydu: Treating Romano Stigger/Extender: [place] April in Treatment: 0 Primary Learner Assessed: Patient Learning Preferences/Education Level/Primary Language Learning Preference: Explanation, Demonstration, Printed Material Highest Education Level: High School Preferred Language: English Cognitive Barrier Language Barrier: No Translator Needed: No Memory Deficit: No Emotional Barrier: No Cultural/Religious Beliefs Affecting Medical Care: No Physical Barrier Impaired Vision: No Impaired Hearing: No Decreased Hand dexterity: No Knowledge/Comprehension Knowledge Level: High Comprehension Level: High Ability to understand written instructions: High Ability to understand verbal instructions: High Motivation Anxiety Level: Calm Cooperation: Cooperative Education Importance: Acknowledges Need Interest in Health Problems: Asks Questions Perception: Coherent Willingness to Engage in Self-Management High Activities: Readiness to Engage in Self-Management High Activities: Electronic Signature(s) Signed: 10/20/2019 5:10:38 PM By: Baruch Gouty RN, BSN Entered By: Baruch Gouty on 10/20/2019 15:10:51 -------------------------------------------------------------------------------- Fall Risk Assessment Details Patient Name: Date of Service: Misty [name], Misty R. 10/20/2019 2:45 PM Medical Record Number: [id number] Patient Account Number: [id number] Date of  Birth/Sex: Treating RN: [birth year] (39 y.o. Elam Dutch Primary Care Hakop Humbarger: Jose Persia Other Clinician: Referring Steph Cheadle: Treating Ariadne Rissmiller/Extender: [place] April in Treatment: 0 Fall Risk Assessment Items Have you had 2 or more falls in the last 12 monthso 0 No Have you had any fall that resulted in injury in the last 12 monthso 0 No FALLS RISK SCREEN History of falling - immediate or within 3 months 0 No Secondary diagnosis (Do you have 2 or more medical diagnoseso) 0 No Ambulatory aid None/bed rest/wheelchair/nurse 0 Yes Crutches/cane/walker 0 No Furniture 0 No Intravenous therapy Access/Saline/Heparin Lock 0 No Gait/Transferring Normal/ bed rest/ wheelchair 0 Yes Weak (short steps with  or without shuffle, stooped but able to lift head while walking, may seek 0 No support from furniture) Impaired (short steps with shuffle, may have difficulty arising from chair, head down, impaired 0 No balance) Mental Status Oriented to own ability 0 Yes Electronic Signature(s) Signed: 10/20/2019 5:10:38 PM By: Baruch Gouty RN, BSN Entered By: Baruch Gouty on 10/20/2019 15:11:03 -------------------------------------------------------------------------------- Foot Assessment Details Patient Name: Date of Service: Misty [name], Misty R. 10/20/2019 2:45 PM Medical Record Number: [id number] Patient Account Number: [id number] Date of Birth/Sex: Treating RN: [birth year] (39 y.o. Elam Dutch Primary Care Honestie Kulik: Jose Persia Other Clinician: Referring Shalay Carder: Treating Maymie Brunke/Extender: [place] April in Treatment: 0 Foot Assessment Items Site Locations + = Sensation present, - = Sensation absent, C = Callus, U = Ulcer R = Redness, W = Warmth, M = Maceration, PU = Pre-ulcerative lesion F = Fissure, S = Swelling, D = Dryness Assessment Right: Left: Other Deformity: No No Prior Foot Ulcer: No  No Prior Amputation: No No Charcot Joint: No No Ambulatory Status: Ambulatory Without Help Gait: Steady Electronic Signature(s) Signed: 10/20/2019 5:10:38 PM By: Baruch Gouty RN, BSN Entered By: Baruch Gouty on 10/20/2019 15:14:30 -------------------------------------------------------------------------------- Nutrition Risk Screening Details Patient Name: Date of Service: Misty [name], Misty Donath R. 10/20/2019 2:45 PM Medical Record Number: [id number] Patient Account Number: [id number] Date of Birth/Sex: Treating RN: [birth year] (39 y.o. Martyn Malay, Linda Primary Care Kasper Mudrick: Jose Persia Other Clinician: Referring Holly Pring: Treating Myan Locatelli/Extender: [place] April in Treatment: 0 Height (in): 73 Weight (lbs): 257 Body Mass Index (BMI): 33.9 Nutrition Risk Screening Items Score Screening NUTRITION RISK SCREEN: I have an illness or condition that made me change the kind and/or amount of food I eat 0 No I eat fewer than two meals per day 3 Yes I eat few fruits and vegetables, or milk products 2 Yes I have three or more drinks of beer, liquor or wine almost every day 0 No I have tooth or mouth problems that make it hard for me to eat 0 No I don't always have enough money to buy the food I need 0 No I eat alone most of the time 1 Yes I take three or more different prescribed or over-the-counter drugs a day 1 Yes Without wanting to, I have lost or gained 10 pounds in the last six months 2 Yes I am not always physically able to shop, cook and/or feed myself 0 No Nutrition Protocols Good Risk Protocol Moderate Risk Protocol Provide education on elevated blood High Risk Proctocol 0 sugars and impact on wound healing, as applicable Risk Level: High Risk Score: 9 Electronic Signature(s) Signed: 10/20/2019 5:10:38 PM By: Baruch Gouty RN, BSN Entered By: Baruch Gouty on 10/20/2019 15:12:14

## 2019-10-21 NOTE — Progress Notes (Signed)
BECKHAM, BUXBAUM ([id number]) Visit Report for 10/20/2019 Chief Complaint Document Details Patient Name: Date of Service: VENECIA, MEHL 10/20/2019 2:45 PM Medical Record Number: [id number] Patient Account Number: [id number] Date of Birth/Sex: Treating RN: May 20, 1980 (39 y.o. Nancy Fetter Primary Care Provider: Jose Persia Other Clinician: Referring Provider: Treating Provider/Extender: Salina April in Treatment: 0 Information Obtained from: Patient Chief Complaint 10/20/2019; patient is here for review of areas on her right plantar foot and pain in her right plantar foot. Electronic Signature(s) Signed: 10/21/2019 4:22:08 PM By: Linton Ham MD Entered By: Linton Ham on 10/20/2019 16:20:23 -------------------------------------------------------------------------------- HPI Details Patient Name: Date of Service: Misty [name], Misty [name] 10/20/2019 2:45 PM Medical Record Number: [id number] Patient Account Number: [id number] Date of Birth/Sex: Treating RN: 1980/12/31 (39 y.o. Nancy Fetter Primary Care Provider: Jose Persia Other Clinician: Referring Provider: Treating Provider/Extender: Salina April in Treatment: 0 History of Present Illness HPI Description: ADMISSION note 10/20/2019 This is a 39 year old woman with type 2 diabetes. She is sent along for her wounds and pain on her right plantar foot. She is particularly concerned about an area on her right fifth metatarsal head greater than her first right metatarsal head. Not exactly clear what she has been using on this however there is no open wound in either area. She has been on Bactrim apparently for a small abscess on her buttock. Past medical history includes type 2 diabetes with some degree of peripheral neuropathy, smoking 1/2 pack/day, widespread osteoarthritis, hypertension, hypercholesterolemia ABI in our clinic was  1.19 on the right Electronic Signature(s) Signed: 10/21/2019 4:22:08 PM By: Linton Ham MD Entered By: Linton Ham on 10/20/2019 16:22:40 -------------------------------------------------------------------------------- Physical Exam Details Patient Name: Date of Service: Misty [name], [place] 10/20/2019 2:45 PM Medical Record Number: [id number] Patient Account Number: [id number] Date of Birth/Sex: Treating RN: [birth year] (39 y.o. Nancy Fetter Primary Care Provider: Jose Persia Other Clinician: Referring Provider: Treating Provider/Extender: Salina April in Treatment: 0 Constitutional Sitting or standing Blood Pressure is within target range for patient.. Pulse regular and within target range for patient.Marland Kitchen Respirations regular, non-labored and within target range.. Temperature is normal and within the target range for the patient.Marland Kitchen Appears in no distress. Respiratory work of breathing is normal. Cardiovascular Pedal pulses are vibrant in the right foot. Extremities are free of varicosities, clubbing or edema. Peripheral pulses strong and equal. Capillary refill < 3 seconds.. Musculoskeletal No evidence of active joints in her PIPs MTPs or ankle on the right. Integumentary (Hair, Skin) I saw no cutaneous issues. Neurological She probably has some degree of neuropathy mild loss of vibration sense and monofilament. Psychiatric appears at normal baseline. Notes Wound exam; on the right foot there is no open area. She has some callused areas over the right fifth metatarsal head and the first metatarsal head but no other open areas are seen. There was no evidence of infection. These areas were not tender but they were slightly callused. Electronic Signature(s) Signed: 10/21/2019 4:22:08 PM By: Linton Ham MD Entered By: Linton Ham on 10/20/2019  16:24:21 -------------------------------------------------------------------------------- Physician Orders Details Patient Name: Date of Service: Misty [name], Misty R. 10/20/2019 2:45 PM Medical Record Number: [id number] Patient Account Number: [id number] Date of Birth/Sex: Treating RN: 08-26-1980 (39 y.o. Nancy Fetter Primary Care Provider: Jose Persia Other Clinician: Referring Provider: Treating Provider/Extender: Salina April in Treatment: 0 Verbal / Phone Orders: No Diagnosis Coding Discharge From [facility] Services Discharge from [place] -  Consult - no open wounds Electronic Signature(s) Signed: 10/20/2019 5:42:40 PM By: Levan Hurst RN, BSN Signed: 10/21/2019 4:22:08 PM By: Linton Ham MD Entered By: Levan Hurst on 10/20/2019 16:00:30 -------------------------------------------------------------------------------- Problem List Details Patient Name: Date of Service: Misty [name], Misty R. 10/20/2019 2:45 PM Medical Record Number: [id number] Patient Account Number: [id number] Date of Birth/Sex: Treating RN: Aug 06, 1980 (39 y.o. Nancy Fetter Primary Care Provider: Jose Persia Other Clinician: Referring Provider: Treating Provider/Extender: Salina April in Treatment: 0 Active Problems ICD-10 Encounter Code Description Active Date MDM Diagnosis E11.621 Type 2 diabetes mellitus with foot ulcer 10/20/2019 No Yes L97.518 Non-pressure chronic ulcer of other part of right foot with other specified 10/20/2019 No Yes severity E11.42 Type 2 diabetes mellitus with diabetic polyneuropathy 10/20/2019 No Yes Inactive Problems Resolved Problems Electronic Signature(s) Signed: 10/21/2019 4:22:08 PM By: Linton Ham MD Entered By: Linton Ham on 10/20/2019 16:19:23 -------------------------------------------------------------------------------- Progress Note Details Patient Name: Date  of Service: Misty [name], Misty [name] 10/20/2019 2:45 PM Medical Record Number: [id number] Patient Account Number: [id number] Date of Birth/Sex: Treating RN: 07/21/1980 (39 y.o. Nancy Fetter Primary Care Provider: Jose Persia Other Clinician: Referring Provider: Treating Provider/Extender: Salina April in Treatment: 0 Subjective Chief Complaint Information obtained from Patient 10/20/2019; patient is here for review of areas on her right plantar foot and pain in her right plantar foot. History of Present Illness (HPI) ADMISSION note 10/20/2019 This is a 39 year old woman with type 2 diabetes. She is sent along for her wounds and pain on her right plantar foot. She is particularly concerned about an area on her right fifth metatarsal head greater than her first right metatarsal head. Not exactly clear what she has been using on this however there is no open wound in either area. She has been on Bactrim apparently for a small abscess on her buttock. Past medical history includes type 2 diabetes with some degree of peripheral neuropathy, smoking 1/2 pack/day, widespread osteoarthritis, hypertension, hypercholesterolemia ABI in our clinic was 1.19 on the right Patient History Information obtained from Patient. Allergies No Known Allergies Family History Cancer - Mother, Diabetes - Mother, Heart Disease - Father, Hypertension - Mother, No family history of Hereditary Spherocytosis, Kidney Disease, Lung Disease, Seizures, Stroke, Thyroid Problems, Tuberculosis. Social History Current every day smoker - 1/2 ppd, Marital Status - Single, Alcohol Use - Never, Drug Use - No History, Caffeine Use - Daily - pepsi. Medical History Cardiovascular Patient has history of Hypertension Endocrine Patient has history of Type II Diabetes Denies history of Type I Diabetes Genitourinary Denies history of End Stage Renal Disease Musculoskeletal Patient has history  of Osteoarthritis Neurologic Patient has history of Neuropathy Oncologic Denies history of Received Chemotherapy, Received Radiation Patient is treated with Insulin, Oral Agents. Blood sugar is tested. Hospitalization/Surgery History - c-section. Medical A Surgical History Notes nd Constitutional Symptoms (General Health) obesity, vitamin D deficiency Cardiovascular hyper;ipidemia Review of Systems (ROS) Constitutional Symptoms (General Health) Denies complaints or symptoms of Fatigue, Fever, Chills, Marked Weight Change. Eyes Denies complaints or symptoms of Dry Eyes, Vision Changes, Glasses / Contacts. Ear/Nose/Mouth/Throat Denies complaints or symptoms of Chronic sinus problems or rhinitis. Respiratory Denies complaints or symptoms of Chronic or frequent coughs, Shortness of Breath. Gastrointestinal Denies complaints or symptoms of Frequent diarrhea, Nausea, Vomiting. Genitourinary Denies complaints or symptoms of Frequent urination. Integumentary (Skin) Denies complaints or symptoms of Wounds. Musculoskeletal Denies complaints or symptoms of Muscle Pain, Muscle Weakness. Neurologic Complains or has symptoms of Numbness/parasthesias. Psychiatric Denies  complaints or symptoms of Claustrophobia, Suicidal. Objective Constitutional Sitting or standing Blood Pressure is within target range for patient.. Pulse regular and within target range for patient.Marland Kitchen Respirations regular, non-labored and within target range.. Temperature is normal and within the target range for the patient.Marland Kitchen Appears in no distress. Vitals Time Taken: 2:59 PM, Height: 73 in, Source: Stated, Weight: 257 lbs, Source: Stated, BMI: 33.9, Temperature: 98.6 F, Pulse: 102 bpm, Respiratory Rate: 18 breaths/min, Blood Pressure: 125/83 mmHg, Capillary Blood Glucose: 147 mg/dl. General Notes: glucose per pt report last night Respiratory work of breathing is normal. Cardiovascular Pedal pulses are vibrant in the  right foot. Extremities are free of varicosities, clubbing or edema. Peripheral pulses strong and equal. Capillary refill < 3 seconds.. Musculoskeletal No evidence of active joints in her PIPs MTPs or ankle on the right. Neurological She probably has some degree of neuropathy mild loss of vibration sense and monofilament. Psychiatric appears at normal baseline. General Notes: Wound exam; on the right foot there is no open area. She has some callused areas over the right fifth metatarsal head and the first metatarsal head but no other open areas are seen. There was no evidence of infection. These areas were not tender but they were slightly callused. Integumentary (Hair, Skin) I saw no cutaneous issues. Assessment Active Problems ICD-10 Type 2 diabetes mellitus with foot ulcer Non-pressure chronic ulcer of other part of right foot with other specified severity Type 2 diabetes mellitus with diabetic polyneuropathy Plan Discharge From [facility] Services: Discharge from [place] - no open wounds 1. The patient can be discharged from the clinic she has no open wounds 2. I have advised her to get insoles for her shoes perhaps callus pads to protect these areas 3. The exact nature of her widespread pain especially in her feet is unclear. Osteoarthritis does not usually affect feet 4. She does have neuropathy but it is mild. I am uncertain whether her pain has anything to do with this or not. 5. I think the patient is certainly at risk for developing wounds on her feet but right now she did not need to be followed here. Electronic Signature(s) Signed: 10/21/2019 4:22:08 PM By: Linton Ham MD Entered By: Linton Ham on 10/20/2019 16:25:38 -------------------------------------------------------------------------------- HxROS Details Patient Name: Date of Service: Misty [name], Misty R. 10/20/2019 2:45 PM Medical Record Number: [id number] Patient Account Number:  [id number] Date of Birth/Sex: Treating RN: March 19, 1980 (39 y.o. Misty [name] Primary Care Provider: Jose Persia Other Clinician: Referring Provider: Treating Provider/Extender: Salina April in Treatment: 0 Information Obtained From Patient Constitutional Symptoms (General Health) Complaints and Symptoms: Negative for: Fatigue; Fever; Chills; Marked Weight Change Medical History: Past Medical History Notes: obesity, vitamin D deficiency Eyes Complaints and Symptoms: Negative for: Dry Eyes; Vision Changes; Glasses / Contacts Ear/Nose/Mouth/Throat Complaints and Symptoms: Negative for: Chronic sinus problems or rhinitis Respiratory Complaints and Symptoms: Negative for: Chronic or frequent coughs; Shortness of Breath Gastrointestinal Complaints and Symptoms: Negative for: Frequent diarrhea; Nausea; Vomiting Genitourinary Complaints and Symptoms: Negative for: Frequent urination Medical History: Negative for: End Stage Renal Disease Integumentary (Skin) Complaints and Symptoms: Negative for: Wounds Musculoskeletal Complaints and Symptoms: Negative for: Muscle Pain; Muscle Weakness Medical History: Positive for: Osteoarthritis Neurologic Complaints and Symptoms: Positive for: Numbness/parasthesias Medical History: Positive for: Neuropathy Psychiatric Complaints and Symptoms: Negative for: Claustrophobia; Suicidal Hematologic/Lymphatic Cardiovascular Medical History: Positive for: Hypertension Past Medical History Notes: hyper;ipidemia Endocrine Medical History: Positive for: Type II Diabetes Negative for: Type I  Diabetes Time with diabetes: 1 yr Treated with: Insulin, Oral agents Blood sugar tested every day: Yes Tested : once Immunological Oncologic Medical History: Negative for: Received Chemotherapy; Received Radiation Immunizations Pneumococcal Vaccine: Received Pneumococcal Vaccination: No Implantable  Devices None Hospitalization / Surgery History Type of Hospitalization/Surgery c-section Family and Social History Cancer: Yes - Mother; Diabetes: Yes - Mother; Heart Disease: Yes - Father; Hereditary Spherocytosis: No; Hypertension: Yes - Mother; Kidney Disease: No; Lung Disease: No; Seizures: No; Stroke: No; Thyroid Problems: No; Tuberculosis: No; Current every day smoker - 1/2 ppd; Marital Status - Single; Alcohol Use: Never; Drug Use: No History; Caffeine Use: Daily - pepsi; Financial Concerns: No; Food, Clothing or Shelter Needs: No; Support System Lacking: No; Transportation Concerns: No Electronic Signature(s) Signed: 10/20/2019 5:10:38 PM By: Baruch Gouty RN, BSN Signed: 10/21/2019 4:22:08 PM By: Linton Ham MD Entered By: Baruch Gouty on 10/20/2019 15:09:49 -------------------------------------------------------------------------------- SuperBill Details Patient Name: Date of Service: Misty [name], Misty R. 10/20/2019 Medical Record Number: [id number] Patient Account Number: [id number] Date of Birth/Sex: Treating RN: [birth year] (39 y.o. Nancy Fetter Primary Care Provider: Jose Persia Other Clinician: Referring Provider: Treating Provider/Extender: Cletus Gash Weeks in Treatment: 0 Diagnosis Coding ICD-10 Codes Code Description [phone] Type 2 diabetes mellitus with foot ulcer L97.518 Non-pressure chronic ulcer of other part of right foot with other specified severity E11.42 Type 2 diabetes mellitus with diabetic polyneuropathy Facility Procedures CPT4 Code: [id number] Description: 04471 - WOUND CARE VISIT-LEV 3 EST PT Modifier: Quantity: 1 Electronic Signature(s) Signed: 10/20/2019 5:42:40 PM By: Levan Hurst RN, BSN Signed: 10/21/2019 4:22:08 PM By: Linton Ham MD Entered By: Levan Hurst on 10/20/2019 17:23:39

## 2019-10-21 NOTE — Progress Notes (Signed)
Misty [name], Misty [name] ([id number]) Visit Report for 10/20/2019 Allergy List Details Patient Name: Date of Service: Misty [name], Misty [name] 10/20/2019 2:45 PM Medical Record Number: [id number] Patient Account Number: [id number] Date of Birth/Sex: Treating RN: 04/21/1980 (39 y.o. Elam Dutch Primary Care Olina Melfi: Jose Persia Other Clinician: Referring Christy Friede: Treating Kennidy Lamke/Extender: Cletus Gash Weeks in Treatment: 0 Allergies Active Allergies No Known Allergies Allergy Notes Electronic Signature(s) Signed: 10/20/2019 5:10:38 PM By: Baruch Gouty RN, BSN Entered By: Baruch Gouty on 10/20/2019 15:02:28 -------------------------------------------------------------------------------- Arrival Information Details Patient Name: Date of Service: Misty [name], Misty R. 10/20/2019 2:45 PM Medical Record Number: [id number] Patient Account Number: [id number] Date of Birth/Sex: Treating RN: [birth year] (39 y.o. Elam Dutch Primary Care Rayette Mogg: Jose Persia Other Clinician: Referring Ceazia Harb: Treating Soma Bachand/Extender: Salina April in Treatment: 0 Visit Information Patient Arrived: Ambulatory Arrival Time: 14:50 Accompanied By: self Transfer Assistance: None Patient Identification Verified: Yes Secondary Verification Process Completed: Yes Patient Requires Transmission-Based Precautions: No Patient Has Alerts: No Electronic Signature(s) Signed: 10/20/2019 5:10:38 PM By: Baruch Gouty RN, BSN Entered By: Baruch Gouty on 10/20/2019 14:59:49 -------------------------------------------------------------------------------- Clinic Level of Care Assessment Details Patient Name: Date of Service: Misty [name], Misty R. 10/20/2019 2:45 PM Medical Record Number: [id number] Patient Account Number: [id number] Date of Birth/Sex: Treating RN: 1980/04/18 (39 y.o. Nancy Fetter Primary Care  Montavious Wierzba: Jose Persia Other Clinician: Referring Sarrah Fiorenza: Treating Tremel Setters/Extender: Salina April in Treatment: 0 Clinic Level of Care Assessment Items TOOL 2 Quantity Score X- 1 0 Use when only an EandM is performed on the INITIAL visit ASSESSMENTS - Nursing Assessment / Reassessment X- 1 20 General Physical Exam (combine w/ comprehensive assessment (listed just below) when performed on new pt. evals) X- 1 25 Comprehensive Assessment (HX, ROS, Risk Assessments, Wounds Hx, etc.) ASSESSMENTS - Wound and Skin A ssessment / Reassessment []  - 0 Simple Wound Assessment / Reassessment - one wound []  - 0 Complex Wound Assessment / Reassessment - multiple wounds []  - 0 Dermatologic / Skin Assessment (not related to wound area) ASSESSMENTS - Ostomy and/or Continence Assessment and Care []  - 0 Incontinence Assessment and Management []  - 0 Ostomy Care Assessment and Management (repouching, etc.) PROCESS - Coordination of Care X - Simple Patient / Family Education for ongoing care 1 15 []  - 0 Complex (extensive) Patient / Family Education for ongoing care X- 1 10 Staff obtains Programmer, systems, Records, T Results / Process Orders est []  - 0 Staff telephones HHA, Nursing Homes / Clarify orders / etc []  - 0 Routine Transfer to another Facility (non-emergent condition) []  - 0 Routine Hospital Admission (non-emergent condition) X- 1 15 New Admissions / Biomedical engineer / Ordering NPWT Apligraf, etc. , []  - 0 Emergency Hospital Admission (emergent condition) X- 1 10 Simple Discharge Coordination []  - 0 Complex (extensive) Discharge Coordination PROCESS - Special Needs []  - 0 Pediatric / Minor Patient Management []  - 0 Isolation Patient Management []  - 0 Hearing / Language / Visual special needs []  - 0 Assessment of Community assistance (transportation, D/C planning, etc.) []  - 0 Additional assistance / Altered mentation []  - 0 Support  Surface(s) Assessment (bed, cushion, seat, etc.) INTERVENTIONS - Wound Cleansing / Measurement []  - 0 Wound Imaging (photographs - any number of wounds) []  - 0 Wound Tracing (instead of photographs) []  - 0 Simple Wound Measurement - one wound []  - 0 Complex Wound Measurement - multiple wounds []  - 0 Simple Wound Cleansing - one wound []  - 0 Complex  Wound Cleansing - multiple wounds INTERVENTIONS - Wound Dressings []  - 0 Small Wound Dressing one or multiple wounds []  - 0 Medium Wound Dressing one or multiple wounds []  - 0 Large Wound Dressing one or multiple wounds []  - 0 Application of Medications - injection INTERVENTIONS - Miscellaneous []  - 0 External ear exam []  - 0 Specimen Collection (cultures, biopsies, blood, body fluids, etc.) []  - 0 Specimen(s) / Culture(s) sent or taken to Lab for analysis []  - 0 Patient Transfer (multiple staff / Harrel Lemon Lift / Similar devices) []  - 0 Simple Staple / Suture removal (25 or less) []  - 0 Complex Staple / Suture removal (26 or more) []  - 0 Hypo / Hyperglycemic Management (close monitor of Blood Glucose) X- 1 15 Ankle / Brachial Index (ABI) - do not check if billed separately Has the patient been seen at the hospital within the last three years: Yes Total Score: 110 Level Of Care: New/Established - Level 3 Electronic Signature(s) Signed: 10/20/2019 5:42:40 PM By: Levan Hurst RN, BSN Entered By: Levan Hurst on 10/20/2019 15:55:51 -------------------------------------------------------------------------------- Encounter Discharge Information Details Patient Name: Date of Service: Misty [name], Misty R. 10/20/2019 2:45 PM Medical Record Number: [id number] Patient Account Number: [id number] Date of Birth/Sex: Treating RN: [birth year] (39 y.o. Debby Bud Primary Care Sully Dyment: Jose Persia Other Clinician: Referring Karime Scheuermann: Treating Eleni Frank/Extender: Salina April in Treatment:  0 Encounter Discharge Information Items Discharge Condition: Stable Ambulatory Status: Ambulatory Discharge Destination: Home Transportation: Private Auto Accompanied By: self Schedule Follow-up Appointment: No Clinical Summary of Care: Electronic Signature(s) Signed: 10/20/2019 5:10:30 PM By: Deon Pilling Entered By: Deon Pilling on 10/20/2019 17:07:41 -------------------------------------------------------------------------------- Lower Extremity Assessment Details Patient Name: Date of Service: Misty [name], Misty [name] 10/20/2019 2:45 PM Medical Record Number: [id number] Patient Account Number: [id number] Date of Birth/Sex: Treating RN: 27-Jan-1980 (39 y.o. Elam Dutch Primary Care Kamaree Berkel: Jose Persia Other Clinician: Referring Roderick Calo: Treating Grethel Zenk/Extender: Cletus Gash Weeks in Treatment: 0 Edema Assessment Assessed: [Left: No] [Right: No] Edema: [Left: N] [Right: o] Calf Left: Right: Point of Measurement: From Medial Instep 39.5 cm Ankle Left: Right: Point of Measurement: From Medial Instep 22.8 cm Vascular Assessment Pulses: Dorsalis Pedis Palpable: [Right:Yes] Blood Pressure: Brachial: [Right:125] Ankle: [Right:Dorsalis Pedis: 148 1.18] Electronic Signature(s) Signed: 10/20/2019 5:10:38 PM By: Baruch Gouty RN, BSN Entered By: Baruch Gouty on 10/20/2019 15:19:50 -------------------------------------------------------------------------------- Multi Wound Chart Details Patient Name: Date of Service: Misty [name], Misty [name] 10/20/2019 2:45 PM Medical Record Number: [id number] Patient Account Number: [id number] Date of Birth/Sex: Treating RN: 1980/08/12 (39 y.o. Nancy Fetter Primary Care Hao Dion: Jose Persia Other Clinician: Referring Milynn Quirion: Treating Jeraline Marcinek/Extender: Salina April in Treatment: 0 Vital Signs Height(in): 73 Capillary Blood Glucose(mg/dl):  147 Weight(lbs): 257 Pulse(bpm): 102 Body Mass Index(BMI): 34 Blood Pressure(mmHg): 125/83 Temperature(F): 98.6 Respiratory Rate(breaths/min): 18 Wound Assessments Treatment Notes Electronic Signature(s) Signed: 10/20/2019 5:42:40 PM By: Levan Hurst RN, BSN Signed: 10/21/2019 4:22:08 PM By: Linton Ham MD Entered By: Linton Ham on 10/20/2019 16:19:36 -------------------------------------------------------------------------------- Pain Assessment Details Patient Name: Date of Service: Misty [name], Misty R. 10/20/2019 2:45 PM Medical Record Number: [id number] Patient Account Number: [id number] Date of Birth/Sex: Treating RN: 1980/03/05 (39 y.o. Elam Dutch Primary Care Geovonni Meyerhoff: Jose Persia Other Clinician: Referring Yvaine Jankowiak: Treating Carrianne Hyun/Extender: Salina April in Treatment: 0 Active Problems Location of Pain Severity and Description of Pain Patient Has Paino Yes Site Locations Duration of the Pain. Constant / Intermittento Constant Rate the pain. Current Pain Level:  7 Worst Pain Level: 9 Least Pain Level: 5 Character of Pain Describe the Pain: Aching Pain Management and Medication Current Pain Management: Rest: Yes Is the Current Pain Management Adequate: Adequate How does your wound impact your activities of daily livingo Sleep: Yes Bathing: No Appetite: No Relationship With Others: No Bladder Continence: No Emotions: Yes Bowel Continence: No Work: No Toileting: No Drive: No Dressing: No Hobbies: No Electronic Signature(s) Signed: 10/20/2019 5:10:38 PM By: Baruch Gouty RN, BSN Entered By: Baruch Gouty on 10/20/2019 15:22:34 -------------------------------------------------------------------------------- Patient/Caregiver Education Details Patient Name: Date of Service: Misty [name] 10/18/2021andnbsp2:45 PM Medical Record Number: [id number] Patient Account Number:  [id number] Date of Birth/Gender: Treating RN: [birth year] (39 y.o. Nancy Fetter Primary Care Physician: Jose Persia Other Clinician: Referring Physician: Treating Physician/Extender: Salina April in Treatment: 0 Education Assessment Education Provided To: Patient Education Topics Provided Nutrition: Methods: Explain/Verbal Responses: State content correctly Wound/Skin Impairment: Methods: Explain/Verbal Responses: State content correctly Electronic Signature(s) Signed: 10/20/2019 5:42:40 PM By: Levan Hurst RN, BSN Entered By: Levan Hurst on 10/20/2019 15:54:52 -------------------------------------------------------------------------------- [place] Details Patient Name: Date of Service: Misty [name], Misty R. 10/20/2019 2:45 PM Medical Record Number: [id number] Patient Account Number: [id number] Date of Birth/Sex: Treating RN: [birth year] (39 y.o. Elam Dutch Primary Care Nakeda Lebron: Jose Persia Other Clinician: Referring Devon Pretty: Treating Tanis Burnley/Extender: Salina April in Treatment: 0 Vital Signs Time Taken: 14:59 Temperature (F): 98.6 Height (in): 73 Pulse (bpm): 102 Source: Stated Respiratory Rate (breaths/min): 18 Weight (lbs): 257 Blood Pressure (mmHg): 125/83 Source: Stated Capillary Blood Glucose (mg/dl): 147 Body Mass Index (BMI): 33.9 Reference Range: 80 - 120 mg / dl Notes glucose per pt report last night Electronic Signature(s) Signed: 10/20/2019 5:10:38 PM By: Baruch Gouty RN, BSN Entered By: Baruch Gouty on 10/20/2019 15:01:09

## 2019-11-03 DIAGNOSIS — Z419 Encounter for procedure for purposes other than remedying health state, unspecified: Secondary | ICD-10-CM | POA: Diagnosis not present

## 2019-11-05 DIAGNOSIS — E559 Vitamin D deficiency, unspecified: Secondary | ICD-10-CM | POA: Diagnosis not present

## 2019-11-05 DIAGNOSIS — Z79899 Other long term (current) drug therapy: Secondary | ICD-10-CM | POA: Diagnosis not present

## 2019-11-05 DIAGNOSIS — M129 Arthropathy, unspecified: Secondary | ICD-10-CM | POA: Diagnosis not present

## 2019-11-05 DIAGNOSIS — E118 Type 2 diabetes mellitus with unspecified complications: Secondary | ICD-10-CM | POA: Diagnosis not present

## 2019-11-05 DIAGNOSIS — N926 Irregular menstruation, unspecified: Secondary | ICD-10-CM | POA: Diagnosis not present

## 2019-11-05 DIAGNOSIS — Z1159 Encounter for screening for other viral diseases: Secondary | ICD-10-CM | POA: Diagnosis not present

## 2019-11-05 DIAGNOSIS — E7800 Pure hypercholesterolemia, unspecified: Secondary | ICD-10-CM | POA: Diagnosis not present

## 2019-11-05 DIAGNOSIS — F17210 Nicotine dependence, cigarettes, uncomplicated: Secondary | ICD-10-CM | POA: Diagnosis not present

## 2019-11-06 ENCOUNTER — Encounter (HOSPITAL_BASED_OUTPATIENT_CLINIC_OR_DEPARTMENT_OTHER): Payer: Medicaid Other | Admitting: Internal Medicine

## 2019-11-06 DIAGNOSIS — M19011 Primary osteoarthritis, right shoulder: Secondary | ICD-10-CM | POA: Diagnosis not present

## 2019-11-06 DIAGNOSIS — M545 Low back pain, unspecified: Secondary | ICD-10-CM | POA: Diagnosis not present

## 2019-11-06 DIAGNOSIS — F17210 Nicotine dependence, cigarettes, uncomplicated: Secondary | ICD-10-CM | POA: Diagnosis not present

## 2019-11-06 DIAGNOSIS — M47812 Spondylosis without myelopathy or radiculopathy, cervical region: Secondary | ICD-10-CM | POA: Diagnosis not present

## 2019-11-06 DIAGNOSIS — E118 Type 2 diabetes mellitus with unspecified complications: Secondary | ICD-10-CM | POA: Diagnosis not present

## 2019-11-06 DIAGNOSIS — R209 Unspecified disturbances of skin sensation: Secondary | ICD-10-CM | POA: Diagnosis not present

## 2019-11-06 DIAGNOSIS — Z79899 Other long term (current) drug therapy: Secondary | ICD-10-CM | POA: Diagnosis not present

## 2019-12-03 DIAGNOSIS — Z419 Encounter for procedure for purposes other than remedying health state, unspecified: Secondary | ICD-10-CM | POA: Diagnosis not present

## 2019-12-04 DIAGNOSIS — R209 Unspecified disturbances of skin sensation: Secondary | ICD-10-CM | POA: Diagnosis not present

## 2019-12-04 DIAGNOSIS — M545 Low back pain, unspecified: Secondary | ICD-10-CM | POA: Diagnosis not present

## 2019-12-04 DIAGNOSIS — M19011 Primary osteoarthritis, right shoulder: Secondary | ICD-10-CM | POA: Diagnosis not present

## 2019-12-04 DIAGNOSIS — M47812 Spondylosis without myelopathy or radiculopathy, cervical region: Secondary | ICD-10-CM | POA: Diagnosis not present

## 2019-12-04 DIAGNOSIS — F17210 Nicotine dependence, cigarettes, uncomplicated: Secondary | ICD-10-CM | POA: Diagnosis not present

## 2019-12-04 DIAGNOSIS — E118 Type 2 diabetes mellitus with unspecified complications: Secondary | ICD-10-CM | POA: Diagnosis not present

## 2019-12-04 DIAGNOSIS — Z79899 Other long term (current) drug therapy: Secondary | ICD-10-CM | POA: Diagnosis not present

## 2020-01-01 DIAGNOSIS — F17210 Nicotine dependence, cigarettes, uncomplicated: Secondary | ICD-10-CM | POA: Diagnosis not present

## 2020-01-01 DIAGNOSIS — Z79899 Other long term (current) drug therapy: Secondary | ICD-10-CM | POA: Diagnosis not present

## 2020-01-01 DIAGNOSIS — E118 Type 2 diabetes mellitus with unspecified complications: Secondary | ICD-10-CM | POA: Diagnosis not present

## 2020-01-01 DIAGNOSIS — M545 Low back pain, unspecified: Secondary | ICD-10-CM | POA: Diagnosis not present

## 2020-01-01 DIAGNOSIS — R209 Unspecified disturbances of skin sensation: Secondary | ICD-10-CM | POA: Diagnosis not present

## 2020-01-01 DIAGNOSIS — M19011 Primary osteoarthritis, right shoulder: Secondary | ICD-10-CM | POA: Diagnosis not present

## 2020-01-01 DIAGNOSIS — M47812 Spondylosis without myelopathy or radiculopathy, cervical region: Secondary | ICD-10-CM | POA: Diagnosis not present

## 2020-01-03 DIAGNOSIS — Z419 Encounter for procedure for purposes other than remedying health state, unspecified: Secondary | ICD-10-CM | POA: Diagnosis not present

## 2020-01-19 ENCOUNTER — Other Ambulatory Visit: Payer: Self-pay | Admitting: Obstetrics and Gynecology

## 2020-01-19 ENCOUNTER — Other Ambulatory Visit: Payer: Self-pay

## 2020-01-19 NOTE — Patient Outreach (Signed)
Medicaid Managed Care   Nurse Care Manager Note  01/19/2020 Name:  SANTORIA CHASON MRN:  [id number] DOB:  1980/09/18  [name] is an 40 y.o. year old female who is a primary patient of Jose Persia, MD.  The [facility] Managed Care Coordination team was consulted for assistance with:    chronic healthcare management needs.  [name] was given information about Medicaid Managed Care Coordination team services today. [name] agreed to services and verbal consent obtained.  Engaged with patient by telephone for initial visit in response to provider referral for case management and/or care coordination services.   Assessments/Interventions:  Review of past medical history, allergies, medications, health status, including review of consultants reports, laboratory and other test data, was performed as part of comprehensive evaluation and provision of chronic care management services.  SDOH (Social Determinants of Health) assessments and interventions performed:   Care Plan  No Known Allergies  Medications Reviewed Today    Reviewed by [name], RN (Registered Nurse) on 01/19/20 at 1333  Med List Status: <None>  Medication Order Taking? Sig Documenting Provider Last Dose Status Informant  Accu-Chek FastClix Lancets MISC [id number] Yes Use twice per day to check your blood glucose.  E11.65 Jose Persia, MD Taking Active Self  diclofenac sodium (VOLTAREN) 1 % GEL [id number] Yes Apply 2 g topically 4 (four) times daily. Caccavale, Sophia, PA-C Taking Active   DULoxetine (CYMBALTA) 30 MG capsule [id number]  Take 1 capsule (30 mg total) by mouth at bedtime. Take 1 tab nightly x 1 week then 2 tabs nightly- for nerve pain Lovorn, Megan, MD  Expired 11/25/19 2359   gabapentin (NEURONTIN) 300 MG capsule [id number] No Take 300 mg by mouth at bedtime.  Patient not taking: Reported on 01/19/2020   [provider] Not Taking Active Self            Med Note ([place]   Sun Oct 06, 2018  8:29 AM)    glucose blood (ACCU-CHEK GUIDE) test strip [id number] Yes Use one strip, twice daily to check your blood glucose.  E11.65 Jose Persia, MD Taking Active Self  lisinopril (ZESTRIL) 10 MG tablet [id number] Yes Take 10 mg by mouth daily. [provider] Taking Active   methocarbamol (ROBAXIN) 500 MG tablet [id number] No Take 500 mg by mouth 4 (four) times daily.  Patient not taking: Reported on 01/19/2020   [provider] Not Taking Active   oxyCODONE-acetaminophen (PERCOCET/ROXICET) 5-325 MG tablet [id number] Yes Take 1 tablet by mouth every 8 (eight) hours as needed for severe pain. [provider] Taking Active   pioglitazone (ACTOS) 30 MG tablet [id number] No Take 1 tablet (30 mg total) by mouth daily.  Patient not taking: Reported on 01/19/2020   Jose Persia, MD Not Taking Active Self  potassium chloride (K-DUR) 10 MEQ tablet [id number] No Take 1 tablet (10 mEq total) by mouth daily.  Patient not taking: Reported on 01/19/2020   Jose Persia, MD Not Taking Active Self  prazosin (MINIPRESS) 2 MG capsule [id number] Yes Take 2 mg by mouth at bedtime. [provider] Taking Active   predniSONE (DELTASONE) 5 MG tablet [id number] Yes Take by mouth daily with breakfast. [provider] Taking Active   sertraline (ZOLOFT) 50 MG tablet [id number] No Take 50 mg by mouth daily.  Patient not taking: Reported on 01/19/2020   [provider] Not Taking Active   sitaGLIPtin (JANUVIA) 100 MG tablet [id number] No Take 1 tablet (100 mg  total) by mouth daily.  Patient not taking: Reported on 01/19/2020   [name], MD Not Taking Active Self  traZODone (DESYREL) 50 MG tablet [id number] Yes Take 1 tablet (50 mg total) by mouth at bedtime. Jose Persia, MD Taking Active Self          Patient Active Problem List   Diagnosis Date Noted  . Myofascial pain dysfunction syndrome  11/25/2018  . Nerve pain 11/25/2018  . Hypertension 09/19/2018  . Insomnia 09/19/2018  . Hematuria 09/19/2018  . Left ankle pain 06/25/2018  . Lumbar spine pain 06/25/2018  . Thoracic spine pain 06/25/2018  . Cervical spine pain 06/25/2018  . Hidradenitis suppurativa 01/18/2018  . History of diverticulitis 10/03/2017  . Constipation 10/03/2017  . 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 Morbid obesity ([place]) 08/17/2017  . Hypokalemia 08/17/2017  . Headache 02/28/2017  . Heart palpitations 06/04/2012  . Tobacco use 06/03/2012  . Preventative health care 05/03/2011  . Diabetes type 2, uncontrolled ([place]) 02/24/2008  . Polycystic ovarian disease 07/27/2006  . GERD 03/02/2006    Conditions to be addressed/monitored per PCP order:  as stated above.        Problem: Health Promotion or Disease Self-Management (General Plan of Care)   Priority: Medium  Onset Date: 01/19/2020    Care Plan : General Plan of Care (Adult)  Updates made by [name], RN since 01/19/2020 12:00 AM      Goal: Self-Management Plan Developed   Start Date: 01/19/2020  Expected End Date: 04/18/2020  This Visit's Progress: On track  Priority: Medium  Note:   Current Barriers:  Marland Kitchen Knowledge Deficits related to medications. . Chronic Disease Management support and education needs.  Nurse Case Manager Clinical Goal(s):  Marland Kitchen Over the next 30 days, patient will attend all scheduled medical appointments: . Over the next 30 days, patient will work with CM team pharmacist to review medications. . Over the next 30 days, patient will work with CM clinical social worker to address anxiety.  Interventions:  . Inter-disciplinary care team collaboration (see longitudinal plan of care) . Evaluation of current treatment plan and patient's adherence to plan as established by provider. . Reviewed medications with patient. Nash Dimmer with pharmacy and social work. . Discussed plans with patient for ongoing care  management follow up and provided patient with direct contact information for care management team . Reviewed scheduled/upcoming provider appointments. . Care Guide referral for pharmacy and social work. . Social Work referral for anxiety.  Patient Goals/Self-Care Activities Over the next 30 days, patient will:  -Attends all scheduled provider appointments Calls pharmacy for medication refills Calls provider office for new concerns or questions  Follow Up Plan: The Managed Medicaid care management team will reach out to the patient again over the next 30 days.  The patient has been provided with contact information for the Managed Medicaid care management team and has been advised to call with any health related questions or concerns.            Follow Up:  Patient agrees to Care Plan and Follow-up.  Plan: The Managed Medicaid care management team will reach out to the patient again over the next 30 days. and The patient has been provided with contact information for the Managed Medicaid care management team and has been advised to call with any health related questions or concerns.  Date/time of next scheduled RN care management/care coordination outreach:  02/19/20 at 1030.

## 2020-01-19 NOTE — Patient Instructions (Signed)
Hi [name], thank you for speaking with me today.  [name] was given information about Medicaid Managed Care team care coordination services as a part of their [facility] Medicaid benefit. Averiana Clouatre Pierce-Younger verbally consented to engagement with the [facility] Managed Care team.   For questions related to your [facility] health plan, please call: [phone]  If you would like to schedule transportation through your [facility] plan, please call the following number at least 2 days in advance of your appointment: [phone]   Patient verbalizes understanding of instructions provided today.   The Managed Medicaid care management team will reach out to the patient again over the next 30 days.  The patient has been provided with contact information for the Managed Medicaid care management team and has been advised to call with any health related questions or concerns.   [name] RN, BSN   [facility] - Managed Medicaid High Risk [phone].   Following is a copy of your plan of care:  Patient Care Plan: General Plan of Care (Adult)    Problem Identified: Health Promotion or Disease Self-Management (General Plan of Care)   Priority: Medium  Onset Date: 01/19/2020      Start Date: 01/19/2020  Expected End Date: 04/18/2020  This Visit's Progress: On track  Priority: Medium  Note:   Current Barriers:  Marland Kitchen Knowledge Deficits related to medications. . Chronic Disease Management support and education needs.  Nurse Case Manager Clinical Goal(s):  Marland Kitchen Over the next 30 days, patient will attend all scheduled medical appointments: . Over the next 30 days, patient will work with CM team pharmacist to review medications. . Over the next 30 days, patient will work with CM clinical social worker to address anxiety.  Interventions:  . Inter-disciplinary care team collaboration (see longitudinal plan  of care) . Evaluation of current treatment plan and patient's adherence to plan as established by provider. . Reviewed medications with patient. Nash Dimmer with pharmacy and social work. . Discussed plans with patient for ongoing care management follow up and provided patient with direct contact information for care management team . Reviewed scheduled/upcoming provider appointments. . Care Guide referral for pharmacy and social work. . Social Work referral for anxiety.  Patient Goals/Self-Care Activities Over the next 30 days, patient will:  -Attends all scheduled provider appointments Calls pharmacy for medication refills Calls provider office for new concerns or questions  Follow Up Plan: The Managed Medicaid care management team will reach out to the patient again over the next 30 days.  The patient has been provided with contact information for the Managed Medicaid care management team and has been advised to call with any health related questions or concerns.

## 2020-01-20 ENCOUNTER — Telehealth: Payer: Self-pay | Admitting: Internal Medicine

## 2020-01-20 NOTE — Telephone Encounter (Signed)
I attempted to reach Misty [name] today to get her scheduled for a phone appt with the [place] Medicaid Pharmacist. I left my information so she can return my call. I will reach out again in the next 7-14 days if I do not hear back from her.

## 2020-01-22 ENCOUNTER — Other Ambulatory Visit: Payer: Self-pay

## 2020-02-02 DIAGNOSIS — M19011 Primary osteoarthritis, right shoulder: Secondary | ICD-10-CM | POA: Diagnosis not present

## 2020-02-02 DIAGNOSIS — Z6835 Body mass index (BMI) 35.0-35.9, adult: Secondary | ICD-10-CM | POA: Diagnosis not present

## 2020-02-02 DIAGNOSIS — M47812 Spondylosis without myelopathy or radiculopathy, cervical region: Secondary | ICD-10-CM | POA: Diagnosis not present

## 2020-02-02 DIAGNOSIS — R209 Unspecified disturbances of skin sensation: Secondary | ICD-10-CM | POA: Diagnosis not present

## 2020-02-02 DIAGNOSIS — M545 Low back pain, unspecified: Secondary | ICD-10-CM | POA: Diagnosis not present

## 2020-02-02 DIAGNOSIS — F17210 Nicotine dependence, cigarettes, uncomplicated: Secondary | ICD-10-CM | POA: Diagnosis not present

## 2020-02-02 DIAGNOSIS — E118 Type 2 diabetes mellitus with unspecified complications: Secondary | ICD-10-CM | POA: Diagnosis not present

## 2020-02-02 DIAGNOSIS — Z79899 Other long term (current) drug therapy: Secondary | ICD-10-CM | POA: Diagnosis not present

## 2020-02-03 DIAGNOSIS — Z419 Encounter for procedure for purposes other than remedying health state, unspecified: Secondary | ICD-10-CM | POA: Diagnosis not present

## 2020-02-04 ENCOUNTER — Other Ambulatory Visit: Payer: Self-pay

## 2020-02-11 ENCOUNTER — Other Ambulatory Visit: Payer: Self-pay

## 2020-02-11 NOTE — Patient Instructions (Signed)
Visit Information  [name] was given information about Medicaid Managed Care team care coordination services as a part of their [facility] Medicaid benefit. Mardee Clune Misty [name] verbally consented to engagement with the [facility] Managed Care team.   For questions related to your [facility] health plan, please call: [phone]  If you would like to schedule transportation through your [facility] plan, please call the following number at least 2 days in advance of your appointment: [phone]  [name] - following are the goals we discussed in your visit today:  Goals Addressed            This Visit's Progress   . Medication Adherance       Timeframe:  Short-Term Goal Priority:  High Start Date:      Tpday                       Expected End Date:                       Follow Up Date PRN   - call if I am sick and can't take my medicine    Why is this important?   . These steps will help you keep on track with your medicines.   Notes: Patient stopped taking Lisinopril 22m because she had to go to the bathroom when they increased from 2.532m>10mg.  Plan: Patient will try half a tablet (40m42mto see if this causes her problems or is the result of the raw burger she ate. If 40mg49mse is fine, will go back to taking daily as directed       Please see education materials related to HTN provided as print materials.   Patient verbalizes understanding of instructions provided today.   The Managed Medicaid care management team will reach out to the patient again over the next 90 days.   [name] LLCllowing is a copy of your plan of care:  Patient Care Plan: General Plan of Care (Adult)    Problem Identified: Health Promotion or Disease Self-Management (General Plan of Care)   Priority: Medium  Onset Date: 01/19/2020    Patient Care Plan: General Plan of Care (Adult)    Problem Identified: Health Promotion or Disease  Self-Management (General Plan of Care)     Goal: Self-Management Plan Developed   Start Date: 01/19/2020  Expected End Date: 04/18/2020  This Visit's Progress: On track  Priority: Medium  Note:   Current Barriers:  . KnMarland Kitchenwledge Deficits related to medications. . Chronic Disease Management support and education needs.  Nurse Case Manager Clinical Goal(s):  . OvMarland Kitchenr the next 30 days, patient will attend all scheduled medical appointments: . Over the next 30 days, patient will work with CM team pharmacist to review medications. . Over the next 30 days, patient will work with CM clinical social worker to address anxiety.  Interventions:  . Inter-disciplinary care team collaboration (see longitudinal plan of care) . Evaluation of current treatment plan and patient's adherence to plan as established by provider. . Reviewed medications with patient. . CoNash Dimmerh pharmacy and social work. . Discussed plans with patient for ongoing care management follow up and provided patient with direct contact information for care management team . Reviewed scheduled/upcoming provider appointments. . Care Guide referral for pharmacy and social work. . Social Work referral for anxiety.  Patient Goals/Self-Care Activities Over the next 30 days, patient will:  -Attends all scheduled provider appointments  Calls pharmacy for medication refills Calls provider office for new concerns or questions  Follow Up Plan: The Managed Medicaid care management team will reach out to the patient again over the next 30 days.  The patient has been provided with contact information for the Managed Medicaid care management team and has been advised to call with any health related questions or concerns.       Task: Mutually Develop and Royce Macadamia Achievement of Patient Goals   Note:   Care Management Activities:    - verbalization of feelings encouraged    Notes:    Patient Care Plan: Medication Management     Problem Identified: Health Promotion or Disease Self-Management (General Plan of Care)     Goal: Medication Management   Note:   Current Barriers:  . Does not adhere to prescribed medication regimen .   Pharmacist Clinical Goal(s):  Marland Kitchen Over the next 90 days, patient will achieve adherence to monitoring guidelines and medication adherence to achieve therapeutic efficacy . contact provider office for questions/concerns as evidenced notation of same in electronic health record through collaboration with PharmD and provider.  .   Interventions: . Inter-disciplinary care team collaboration (see longitudinal plan of care) . Comprehensive medication review performed; medication list updated in electronic medical record  @RXCPDIABETES @ @RXCPHYPERTENSION @ @RXCPMENTALHEALTH @  Patient Goals/Self-Care Activities . Over the next 90 days, patient will:  - take medications as prescribed  Follow Up Plan: The care management team will reach out to the patient again over the next 90 days.     Task: Mutually Develop and Royce Macadamia Achievement of Patient Goals   Note:   Care Management Activities:    - self-reflection promoted    Notes:
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
## 2020-02-19 ENCOUNTER — Other Ambulatory Visit: Payer: Self-pay | Admitting: Obstetrics and Gynecology

## 2020-02-19 NOTE — Patient Instructions (Signed)
Hi [name] I missed you today  - as a part of your Medicaid benefit, you are eligible for care management and care coordination services at no cost or copay. I was unable to reach you by phone today but would be happy to help you with your health related needs. Please feel free to call me at [phone].  A member of the Managed Medicaid care management team will reach out to you again over the next 7 days.   [name] RN, BSN [place]  [facility] - Managed Medicaid High Risk [phone]

## 2020-02-19 NOTE — Patient Outreach (Signed)
Care Coordination  02/19/2020  [name] [birth year] [id number]    Medicaid Managed Care   Unsuccessful Outreach Note  02/19/2020 Name: Misty [name] MRN: [id number] DOB: 29-Nov-1980  Referred by: Jose Persia, MD Reason for referral : High Risk Managed Medicaid (Unsuccessful telephone outreach)   An unsuccessful telephone outreach was attempted today. The patient was referred to the case management team for assistance with care management and care coordination.   Follow Up Plan: A member of the Managed Medicaid care management team will reach out to the patient again over the next 7 days.   [name] RN, BSN [place]  [facility] - Managed Medicaid High Risk [phone].

## 2020-03-01 DIAGNOSIS — F17210 Nicotine dependence, cigarettes, uncomplicated: Secondary | ICD-10-CM | POA: Diagnosis not present

## 2020-03-01 DIAGNOSIS — Z6835 Body mass index (BMI) 35.0-35.9, adult: Secondary | ICD-10-CM | POA: Diagnosis not present

## 2020-03-01 DIAGNOSIS — E118 Type 2 diabetes mellitus with unspecified complications: Secondary | ICD-10-CM | POA: Diagnosis not present

## 2020-03-01 DIAGNOSIS — M545 Low back pain, unspecified: Secondary | ICD-10-CM | POA: Diagnosis not present

## 2020-03-01 DIAGNOSIS — Z79899 Other long term (current) drug therapy: Secondary | ICD-10-CM | POA: Diagnosis not present

## 2020-03-01 DIAGNOSIS — M19011 Primary osteoarthritis, right shoulder: Secondary | ICD-10-CM | POA: Diagnosis not present

## 2020-03-01 DIAGNOSIS — R209 Unspecified disturbances of skin sensation: Secondary | ICD-10-CM | POA: Diagnosis not present

## 2020-03-01 DIAGNOSIS — M47812 Spondylosis without myelopathy or radiculopathy, cervical region: Secondary | ICD-10-CM | POA: Diagnosis not present

## 2020-03-02 DIAGNOSIS — Z419 Encounter for procedure for purposes other than remedying health state, unspecified: Secondary | ICD-10-CM | POA: Diagnosis not present

## 2020-03-29 DIAGNOSIS — M79606 Pain in leg, unspecified: Secondary | ICD-10-CM | POA: Diagnosis not present

## 2020-03-29 DIAGNOSIS — M47812 Spondylosis without myelopathy or radiculopathy, cervical region: Secondary | ICD-10-CM | POA: Diagnosis not present

## 2020-03-29 DIAGNOSIS — F17210 Nicotine dependence, cigarettes, uncomplicated: Secondary | ICD-10-CM | POA: Diagnosis not present

## 2020-03-29 DIAGNOSIS — M19011 Primary osteoarthritis, right shoulder: Secondary | ICD-10-CM | POA: Diagnosis not present

## 2020-03-29 DIAGNOSIS — M545 Low back pain, unspecified: Secondary | ICD-10-CM | POA: Diagnosis not present

## 2020-03-29 DIAGNOSIS — E118 Type 2 diabetes mellitus with unspecified complications: Secondary | ICD-10-CM | POA: Diagnosis not present

## 2020-03-29 DIAGNOSIS — Z6835 Body mass index (BMI) 35.0-35.9, adult: Secondary | ICD-10-CM | POA: Diagnosis not present

## 2020-03-29 DIAGNOSIS — Z79899 Other long term (current) drug therapy: Secondary | ICD-10-CM | POA: Diagnosis not present

## 2020-03-29 DIAGNOSIS — R209 Unspecified disturbances of skin sensation: Secondary | ICD-10-CM | POA: Diagnosis not present

## 2020-03-30 DIAGNOSIS — M79606 Pain in leg, unspecified: Secondary | ICD-10-CM | POA: Diagnosis not present

## 2020-04-02 DIAGNOSIS — Z419 Encounter for procedure for purposes other than remedying health state, unspecified: Secondary | ICD-10-CM | POA: Diagnosis not present

## 2020-04-13 ENCOUNTER — Other Ambulatory Visit: Payer: Self-pay

## 2020-04-13 NOTE — Patient Instructions (Signed)
Visit Information  Misty [name] was given information about Medicaid Managed Care team care coordination services as a part of their [facility] Medicaid benefit. Ivonna Kinnick [name] verbally consented to engagement with the [facility] Managed Care team.   For questions related to your [facility] health plan, please call: [phone]  If you would like to schedule transportation through your [facility] plan, please call the following number at least 2 days in advance of your appointment: [phone]   Call the [place] at [phone], at any time, 24 hours a day, 7 days a week. If you are in danger or need immediate medical attention call 911.  [name] - following are the goals we discussed in your visit today:  Goals Addressed   None     Please see education materials related to DM provided as print materials.   Patient verbalizes understanding of instructions provided today.   The Managed Medicaid care management team will reach out to the patient again over the next 30 days.   [place] Constable, Pharm.D., Managed Medicaid Pharmacist [phone]   Following is a copy of your plan of care:  Patient Care Plan: General Plan of Care (Adult)    Problem Identified: Health Promotion or Disease Self-Management (General Plan of Care)   Priority: Medium  Onset Date: 01/19/2020    Patient Care Plan: General Plan of Care (Adult)    Problem Identified: Health Promotion or Disease Self-Management (General Plan of Care)     Goal: Self-Management Plan Developed   Start Date: 01/19/2020  Expected End Date: 04/18/2020  This Visit's Progress: On track  Priority: Medium  Note:   Current Barriers:  Marland Kitchen Knowledge Deficits related to medications. . Chronic Disease Management support and education needs.  Nurse Case Manager Clinical Goal(s):  Marland Kitchen Over the next 30 days, patient will attend all scheduled medical appointments: . Over the next  30 days, patient will work with CM team pharmacist to review medications. . Over the next 30 days, patient will work with CM clinical social worker to address anxiety.  Interventions:  . Inter-disciplinary care team collaboration (see longitudinal plan of care) . Evaluation of current treatment plan and patient's adherence to plan as established by provider. . Reviewed medications with patient. Nash Dimmer with pharmacy and social work. . Discussed plans with patient for ongoing care management follow up and provided patient with direct contact information for care management team . Reviewed scheduled/upcoming provider appointments. . Care Guide referral for pharmacy and social work. . Social Work referral for anxiety.  Patient Goals/Self-Care Activities Over the next 30 days, patient will:  -Attends all scheduled provider appointments Calls pharmacy for medication refills Calls provider office for new concerns or questions  Follow Up Plan: The Managed Medicaid care management team will reach out to the patient again over the next 30 days.  The patient has been provided with contact information for the Managed Medicaid care management team and has been advised to call with any health related questions or concerns.       Task: Mutually Develop and Royce Macadamia Achievement of Patient Goals   Note:   Care Management Activities:    - reassurance provided    Notes:    Patient Care Plan: Medication Management    Problem Identified: Health Promotion or Disease Self-Management (General Plan of Care)     Goal: Medication Management   Note:   Current Barriers:  . Does not adhere to prescribed medication regimen .   Pharmacist  Clinical Goal(s):  Marland Kitchen Over the next 90 days, patient will achieve adherence to monitoring guidelines and medication adherence to achieve therapeutic efficacy . contact provider office for questions/concerns as evidenced notation of same in electronic health record  through collaboration with PharmD and provider.  .   Interventions: . Inter-disciplinary care team collaboration (see longitudinal plan of care) . Comprehensive medication review performed; medication list updated in electronic medical record  @RXCPDIABETES @ @RXCPHYPERTENSION @ @RXCPMENTALHEALTH @  Patient Goals/Self-Care Activities . Over the next 90 days, patient will:  - take medications as prescribed  Follow Up Plan: The care management team will reach out to the patient again over the next 90 days.     Task: Mutually Develop and Royce Macadamia Achievement of Patient Goals   Note:   Care Management Activities:    - verbalization of feelings encouraged    Notes:
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
## 2020-04-14 ENCOUNTER — Other Ambulatory Visit: Payer: Self-pay | Admitting: Internal Medicine

## 2020-04-14 ENCOUNTER — Encounter: Payer: Self-pay | Admitting: Internal Medicine

## 2020-04-14 ENCOUNTER — Telehealth: Payer: Self-pay | Admitting: Internal Medicine

## 2020-04-14 DIAGNOSIS — G4700 Insomnia, unspecified: Secondary | ICD-10-CM

## 2020-04-14 NOTE — Telephone Encounter (Signed)
Spoke with Misty [name] regarding her request to change pharmacies. Advised that since it has been 2 years since she came to our clinic, she needs an appointment with [facility] to discuss med rec before we can send new scripts for her new pharmacy. [name] expressed understanding.

## 2020-04-14 NOTE — Progress Notes (Signed)
Received request for patient to change pharmacies. Medlist appear to be out-dated. Spoke with [name] regarding her request to change pharmacies. Advised that since it has been 2 years since she came to our clinic, she needs an appointment with [facility] to discuss med rec before we can send new scripts for her new pharmacy. [name] expressed understanding.

## 2020-05-02 DIAGNOSIS — Z419 Encounter for procedure for purposes other than remedying health state, unspecified: Secondary | ICD-10-CM | POA: Diagnosis not present

## 2020-05-03 NOTE — Patient Outreach (Signed)
TS/Lancets  Voltaren Gel 1%  Duloxetine 31m QD    Lisinopril/HCTZ 10/12.5 QD  Meloxicam 7.565m Percocet 325/5  Pioglitazone 3014mD    Prazosin 2mg83m    Trazodone 50mg94HW   Trulicity  Tresiba  Omeprazole 40mg65mequested refill of medications from Dr. OheneCristobal Goldmanndelivery on 05/12/20

## 2020-05-06 ENCOUNTER — Other Ambulatory Visit: Payer: Self-pay

## 2020-05-06 NOTE — Patient Outreach (Signed)
Called Misty [name] at Dr. Clemmie Krill office. Requested refills of the following  Trulicity Lisinopril/HCTZ Lancets/TS Omeprazole Meloxicam Tresiba Needles Simvastatin Vit D  Alerted [facility]

## 2020-05-07 NOTE — Patient Outreach (Signed)
Spoke with Janett Billow at Dr. Clemmie Krill office, she will send meds to [facility]

## 2020-05-10 DIAGNOSIS — R209 Unspecified disturbances of skin sensation: Secondary | ICD-10-CM | POA: Diagnosis not present

## 2020-05-10 DIAGNOSIS — F17210 Nicotine dependence, cigarettes, uncomplicated: Secondary | ICD-10-CM | POA: Diagnosis not present

## 2020-05-10 DIAGNOSIS — M19011 Primary osteoarthritis, right shoulder: Secondary | ICD-10-CM | POA: Diagnosis not present

## 2020-05-10 DIAGNOSIS — M47812 Spondylosis without myelopathy or radiculopathy, cervical region: Secondary | ICD-10-CM | POA: Diagnosis not present

## 2020-05-10 DIAGNOSIS — Z79899 Other long term (current) drug therapy: Secondary | ICD-10-CM | POA: Diagnosis not present

## 2020-05-10 DIAGNOSIS — E118 Type 2 diabetes mellitus with unspecified complications: Secondary | ICD-10-CM | POA: Diagnosis not present

## 2020-05-10 DIAGNOSIS — Z6835 Body mass index (BMI) 35.0-35.9, adult: Secondary | ICD-10-CM | POA: Diagnosis not present

## 2020-05-10 DIAGNOSIS — M160 Bilateral primary osteoarthritis of hip: Secondary | ICD-10-CM | POA: Diagnosis not present

## 2020-05-10 NOTE — Patient Outreach (Signed)
Patient called me from Dr. Gabriel Carina, provider is unable to send CS to [facility]. Coordinated with Provider, Patient, and [facility] to get sent in

## 2020-05-11 NOTE — Patient Outreach (Signed)
Spoke with Dr. Hanley Hays, unable to send script to [facility] so she sent all scripts/refills to patient's previous Pharmacy and she told the patient to pick up there.

## 2020-06-02 DIAGNOSIS — Z419 Encounter for procedure for purposes other than remedying health state, unspecified: Secondary | ICD-10-CM | POA: Diagnosis not present

## 2020-06-04 ENCOUNTER — Other Ambulatory Visit: Payer: Self-pay

## 2020-06-04 NOTE — Patient Instructions (Signed)
Visit Information  Misty [name] was given information about Medicaid Managed Care team care coordination services as a part of their [facility] Medicaid benefit. Misty [name] verbally consented to engagement with the [name] Managed Care team.   For questions related to your [facility] health plan, please call: [phone] or go here:[email or web address]  If you would like to schedule transportation through your [facility] plan, please call the following number at least 2 days in advance of your appointment: [phone].  Call the [place] at [phone], at any time, 24 hours a day, 7 days a week. If you are in danger or need immediate medical attention call 911.  Misty [name] - following are the goals we discussed in your visit today:  Goals Addressed   None     Please see education materials related to DM provided as print materials.   Patient verbalizes understanding of instructions provided today.   The patient has been provided with contact information for the Managed Medicaid care management team and has been advised to call with any health related questions or concerns.   [place] Constable, Pharm.D., Managed Medicaid Pharmacist [phone]   Following is a copy of your plan of care:  Patient Care Plan: General Plan of Care (Adult)    Problem Identified: Health Promotion or Disease Self-Management (General Plan of Care)   Priority: Medium  Onset Date: 01/19/2020    Patient Care Plan: General Plan of Care (Adult)    Problem Identified: Health Promotion or Disease Self-Management (General Plan of Care)     Goal: Self-Management Plan Developed   Start Date: 01/19/2020  Expected End Date: 04/18/2020  This Visit's Progress: On track  Priority: Medium  Note:   Current Barriers:  Marland Kitchen Knowledge Deficits related to medications. . Chronic Disease Management support and education needs.  Nurse Case Manager  Clinical Goal(s):  Marland Kitchen Over the next 30 days, patient will attend all scheduled medical appointments: . Over the next 30 days, patient will work with CM team pharmacist to review medications. . Over the next 30 days, patient will work with CM clinical social worker to address anxiety.  Interventions:  . Inter-disciplinary care team collaboration (see longitudinal plan of care) . Evaluation of current treatment plan and patient's adherence to plan as established by provider. . Reviewed medications with patient. Nash Dimmer with pharmacy and social work. . Discussed plans with patient for ongoing care management follow up and provided patient with direct contact information for care management team . Reviewed scheduled/upcoming provider appointments. . Care Guide referral for pharmacy and social work. . Social Work referral for anxiety.  Patient Goals/Self-Care Activities Over the next 30 days, patient will:  -Attends all scheduled provider appointments Calls pharmacy for medication refills Calls provider office for new concerns or questions  Follow Up Plan: The Managed Medicaid care management team will reach out to the patient again over the next 30 days.  The patient has been provided with contact information for the Managed Medicaid care management team and has been advised to call with any health related questions or concerns.       Task: Mutually Develop and Royce Macadamia Achievement of Patient Goals   Note:   Care Management Activities:    - verbalization of feelings encouraged    Notes:    Patient Care Plan: Medication Management    Problem Identified: Health Promotion or Disease Self-Management (General Plan of Care)     Goal: Medication Management   Note:   Current  Barriers:  . Does not adhere to prescribed medication regimen .   Pharmacist Clinical Goal(s):  Marland Kitchen Over the next 90 days, patient will achieve adherence to monitoring guidelines and medication adherence to  achieve therapeutic efficacy . contact provider office for questions/concerns as evidenced notation of same in electronic health record through collaboration with PharmD and provider.  .   Interventions: . Inter-disciplinary care team collaboration (see longitudinal plan of care) . Comprehensive medication review performed; medication list updated in electronic medical record  @RXCPDIABETES @ @RXCPHYPERTENSION @ @RXCPMENTALHEALTH @  Patient Goals/Self-Care Activities . Over the next 90 days, patient will:  - take medications as prescribed  Follow Up Plan: The care management team will reach out to the patient again over the next 90 days.     Task: Mutually Develop and Royce Macadamia Achievement of Patient Goals   Note:   Care Management Activities:    - verbalization of feelings encouraged    Notes:    Patient Care Plan: Medication Management    Problem Identified: Health Promotion or Disease Self-Management (General Plan of Care)     Goal: Medication Management   Note:   Current Barriers:  . Does not maintain contact with provider office . Does not contact provider office for questions/concerns o Patient unable to get her Trulicity for past month, has tried Engineer, civil (consulting). Gave her my number to call me for any future issues  Pharmacist Clinical Goal(s):  Marland Kitchen Over the next 30 days, patient will contact provider office for questions/concerns as evidenced notation of same in electronic health record through collaboration with PharmD and provider.  .   Interventions: . Inter-disciplinary care team collaboration (see longitudinal plan of care) . Comprehensive medication review performed; medication list updated in electronic medical record  @RXCPDIABETES @ @RXCPHYPERTENSION @ @RXCPHYPERLIPIDEMIA @  Patient Goals/Self-Care Activities . Over the next 30 days, patient will:  - collaborate with provider on medication access solutions  Follow Up Plan: The patient has been  provided with contact information for the care management team and has been advised to call with any health related questions or concerns.     Task: Mutually Develop and Royce Macadamia Achievement of Patient Goals   Note:   Care Management Activities:    - verbalization of feelings encouraged    Notes:
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
## 2020-06-10 DIAGNOSIS — Z6835 Body mass index (BMI) 35.0-35.9, adult: Secondary | ICD-10-CM | POA: Diagnosis not present

## 2020-06-10 DIAGNOSIS — E559 Vitamin D deficiency, unspecified: Secondary | ICD-10-CM | POA: Diagnosis not present

## 2020-06-10 DIAGNOSIS — R209 Unspecified disturbances of skin sensation: Secondary | ICD-10-CM | POA: Diagnosis not present

## 2020-06-10 DIAGNOSIS — I10 Essential (primary) hypertension: Secondary | ICD-10-CM | POA: Diagnosis not present

## 2020-06-10 DIAGNOSIS — E782 Mixed hyperlipidemia: Secondary | ICD-10-CM | POA: Diagnosis not present

## 2020-06-10 DIAGNOSIS — M47812 Spondylosis without myelopathy or radiculopathy, cervical region: Secondary | ICD-10-CM | POA: Diagnosis not present

## 2020-06-10 DIAGNOSIS — M160 Bilateral primary osteoarthritis of hip: Secondary | ICD-10-CM | POA: Diagnosis not present

## 2020-06-10 DIAGNOSIS — F17210 Nicotine dependence, cigarettes, uncomplicated: Secondary | ICD-10-CM | POA: Diagnosis not present

## 2020-06-10 DIAGNOSIS — M19011 Primary osteoarthritis, right shoulder: Secondary | ICD-10-CM | POA: Diagnosis not present

## 2020-06-10 DIAGNOSIS — Z79899 Other long term (current) drug therapy: Secondary | ICD-10-CM | POA: Diagnosis not present

## 2020-06-10 DIAGNOSIS — M129 Arthropathy, unspecified: Secondary | ICD-10-CM | POA: Diagnosis not present

## 2020-06-10 DIAGNOSIS — E118 Type 2 diabetes mellitus with unspecified complications: Secondary | ICD-10-CM | POA: Diagnosis not present

## 2020-06-14 DIAGNOSIS — M5030 Other cervical disc degeneration, unspecified cervical region: Secondary | ICD-10-CM | POA: Diagnosis not present

## 2020-06-14 DIAGNOSIS — M5412 Radiculopathy, cervical region: Secondary | ICD-10-CM | POA: Diagnosis not present

## 2020-06-14 DIAGNOSIS — M25511 Pain in right shoulder: Secondary | ICD-10-CM | POA: Diagnosis not present

## 2020-06-23 ENCOUNTER — Other Ambulatory Visit: Payer: Self-pay | Admitting: Obstetrics and Gynecology

## 2020-06-23 ENCOUNTER — Other Ambulatory Visit: Payer: Self-pay
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
## 2020-06-24 ENCOUNTER — Telehealth: Payer: Self-pay

## 2020-06-24 NOTE — Telephone Encounter (Signed)
..   Medicaid Managed Care   Unsuccessful Outreach Note  06/24/2020 Name: Misty [name] MRN: [id number] DOB: [birth year]  Referred by: No primary care provider on file. Reason for referral : High Risk Managed Medicaid (Attempted to reach patient today to get her scheduled with the [facility] BSW. I left my name and number on her VM.)   An unsuccessful telephone outreach was attempted today. The patient was referred to the case management team for assistance with care management and care coordination.   Follow Up Plan: The care management team will reach out to the patient again over the next 3 days.   [place]

## 2020-06-25 ENCOUNTER — Telehealth: Payer: Self-pay

## 2020-06-25 NOTE — Telephone Encounter (Signed)
.   Medicaid Managed Care   Unsuccessful Outreach Note  06/25/2020 Name: Misty [name] MRN: [id number] DOB: 12/22/1980  Referred by: No primary care provider on file. Reason for referral : High Risk Managed Medicaid (Attempted to reach patient today to get her scheduled for a phone visit with the BSW or LCSW on the [facility] Team. I left my name and number on her VM.)   A second unsuccessful telephone outreach was attempted today. The patient was referred to the case management team for assistance with care management and care coordination.   Follow Up Plan: The care management team will reach out to the patient again over the next 3 days.   [name]

## 2020-06-28 ENCOUNTER — Telehealth: Payer: Self-pay

## 2020-06-28 NOTE — Telephone Encounter (Signed)
..   Medicaid Managed Care   Unsuccessful Outreach Note  06/28/2020 Name: Misty [name] MRN: [id number] DOB: 04/08/1980  Referred by: No primary care provider on file. Reason for referral : High Risk Managed Medicaid (Tried to reach this patient again today but I had to leave another message. She needs to be scheduled with the [facility] BSW.)   Third unsuccessful telephone outreach was attempted today. The patient was referred to the case management team for assistance with care management and care coordination. The patient's primary care provider has been notified of our unsuccessful attempts to make or maintain contact with the patient. The care management team is pleased to engage with this patient at any time in the future should he/she be interested in assistance from the care management team.   Follow Up Plan: The care management team will reach out to the patient again over the next 30 days.   [place]

## 2020-07-02 DIAGNOSIS — Z419 Encounter for procedure for purposes other than remedying health state, unspecified: Secondary | ICD-10-CM | POA: Diagnosis not present

## 2020-07-08 ENCOUNTER — Other Ambulatory Visit: Payer: Self-pay

## 2020-07-08 ENCOUNTER — Encounter (HOSPITAL_COMMUNITY): Payer: Self-pay | Admitting: Emergency Medicine

## 2020-07-08 ENCOUNTER — Ambulatory Visit (INDEPENDENT_AMBULATORY_CARE_PROVIDER_SITE_OTHER): Payer: Medicaid Other

## 2020-07-08 ENCOUNTER — Ambulatory Visit (HOSPITAL_COMMUNITY)
Admission: EM | Admit: 2020-07-08 | Discharge: 2020-07-08 | Disposition: A | Discharge status: Home / Self Care | Payer: Medicaid Other | Attending: Internal Medicine | Admitting: Internal Medicine

## 2020-07-08 DIAGNOSIS — S93402A Sprain of unspecified ligament of left ankle, initial encounter: Secondary | ICD-10-CM

## 2020-07-08 DIAGNOSIS — M25572 Pain in left ankle and joints of left foot: Secondary | ICD-10-CM

## 2020-07-08 DIAGNOSIS — M7989 Other specified soft tissue disorders: Secondary | ICD-10-CM | POA: Diagnosis not present

## 2020-07-08 HISTORY — DX: Essential (primary) hypertension: I10

## 2020-07-08 NOTE — ED Provider Notes (Signed)
[place]    CSN: [id number] Arrival date & time: 07/08/20  1235      History   Chief Complaint Chief Complaint  Patient presents with   Ankle Pain    HPI Misty [name] is a 40 y.o. female.   Patient presenting today with 3-day history of left posterior and lateral ankle pain after stepping off a step wrong while turning to look at something behind her.  Some bruising to the lateral ankle, denies numbness, tingling, decreased range of motion to the toes, inability to bear weight.  Has been trying to continue normal daily activities but the continued pain brought her in today.  Has not tried anything over-the-counter for symptoms thus far.  No past traumatic injury to this area.   Past Medical History:  Diagnosis Date   Chest pain    multiple ED evaluations, most likely musculoskeletal (cervical radiculopathy), needs MRI spine   Diabetes mellitus    Diabetes mellitus without complication ([place])    Diverticulitis 2019   GERD (gastroesophageal reflux disease)    Gestational diabetes    Hidradenitis suppurativa 01/18/2018   Hypertension    PCOD (polycystic ovarian disease)    Followed by [facility], has been on metformin   PCOS (polycystic ovarian syndrome)     Patient Active Problem List   Diagnosis Date Noted   Myofascial pain dysfunction syndrome 11/25/2018   Nerve pain 11/25/2018   Hypertension 09/19/2018   Insomnia 09/19/2018   Hematuria 09/19/2018   Left ankle pain 06/25/2018   Lumbar spine pain 06/25/2018   Thoracic spine pain 06/25/2018   Cervical spine pain 06/25/2018   Hidradenitis suppurativa 01/18/2018   History of diverticulitis 10/03/2017   Constipation 10/03/2017   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/2017   Morbid obesity ([place]) 08/17/2017   Hypokalemia 08/17/2017   Headache 02/28/2017   Heart palpitations 06/04/2012   Tobacco use 06/03/2012   Preventative health care 05/03/2011   Diabetes type 2, uncontrolled ([place]) 02/24/2008    Polycystic ovarian disease 07/27/2006   GERD 03/02/2006    Past Surgical History:  Procedure Laterality Date   CESAREAN SECTION  12/14/2011   Procedure: CESAREAN SECTION;  Surgeon: Lahoma Crocker, MD;  Location: [place] [facility];  Service: Obstetrics;  Laterality: N/A;  Primary Cesarean Section Delivery Girl @ 0616, Apgars    OB History     Gravida  1   Para  1   Term  1   Preterm  0   AB  0   Living  1      SAB  0   IAB  0   Ectopic  0   Multiple      Live Births  1            Home Medications    Prior to Admission medications   Medication Sig Start Date End Date Taking? Authorizing Provider  gabapentin (NEURONTIN) 300 MG capsule Take 300 mg by mouth at bedtime. 05/07/18  Yes [provider]  oxyCODONE-acetaminophen (PERCOCET/ROXICET) 5-325 MG tablet Take 1 tablet by mouth every 8 (eight) hours as needed for severe pain.   Yes [provider]  Accu-Chek FastClix Lancets MISC Use twice per day to check your blood glucose.  E11.65 09/18/18   Jose Persia, MD  diclofenac sodium (VOLTAREN) 1 % GEL Apply 2 g topically 4 (four) times daily. Patient not taking: No sig reported 10/09/18   Caccavale, Sophia, PA-C  DULoxetine (CYMBALTA) 30 MG capsule Take 1 capsule (30 mg total) by mouth  at bedtime. Take 1 tab nightly x 1 week then 2 tabs nightly- for nerve pain 11/25/18 11/25/19  Lovorn, Jinny Blossom, MD  glucose blood (ACCU-CHEK GUIDE) test strip Use one strip, twice daily to check your blood glucose.  E11.65 09/18/18   Jose Persia, MD  LANTUS SOLOSTAR 100 UNIT/ML Solostar Pen SMARTSIG:10 Unit(s) SUB-Q Morning-Night 03/29/20   [provider]  lisinopril (ZESTRIL) 10 MG tablet Take 10 mg by mouth daily. Patient not taking: Reported on 07/08/2020    [provider]  methocarbamol (ROBAXIN) 500 MG tablet Take 500 mg by mouth 4 (four) times daily.    [provider]  pioglitazone (ACTOS) 30 MG tablet Take 1 tablet (30 mg total) by mouth  daily. 09/18/18   Jose Persia, MD  prazosin (MINIPRESS) 2 MG capsule Take 2 mg by mouth at bedtime. Patient not taking: Reported on 07/08/2020    [provider]  predniSONE (DELTASONE) 5 MG tablet Take by mouth daily with breakfast. Patient not taking: Reported on 07/08/2020    [provider]  sertraline (ZOLOFT) 50 MG tablet Take 50 mg by mouth daily. Patient not taking: No sig reported    [provider]  sitaGLIPtin (JANUVIA) 100 MG tablet Take 1 tablet (100 mg total) by mouth daily. Patient not taking: No sig reported 06/25/18   [name], MD  traZODone (DESYREL) 50 MG tablet Take 1 tablet (50 mg total) by mouth at bedtime. Patient not taking: Reported on 06/23/2020 09/18/18   Jose Persia, MD    Family History Family History  Problem Relation Age of Onset   Diabetes Mother    Hypertension Mother    Stomach cancer Mother        diet at 24   Heart disease Father        diet at 29   Diabetes Sister    Hypertension Sister    Diabetes Brother    Cancer Maternal Grandmother    Cancer Paternal Grandmother     Social History Social History   Tobacco Use   Smoking status: Every Day    Packs/day: 0.50    Pack years: 0.00    Types: Cigarettes   Smokeless tobacco: Never   Tobacco comments:    0.5ppd  Vaping Use   Vaping Use: Never used  Substance Use Topics   Alcohol use: Not Currently    Comment: social   Drug use: Never     Allergies   Patient has no known allergies.   Review of Systems Review of Systems Per HPI  Physical Exam Triage Vital Signs ED Triage Vitals  Enc Vitals Group     BP 07/08/20 1350 (!) 146/68     Pulse Rate 07/08/20 1350 96     Resp 07/08/20 1350 20     Temp 07/08/20 1350 98.7 F (37.1 C)     Temp Source 07/08/20 1350 Oral     SpO2 07/08/20 1350 100 %     Weight --      Height --      Head Circumference --      Peak Flow --      Pain Score 07/08/20 1345 8     Pain Loc --      Pain Edu? --       Excl. in [place]? --    No data found.  Updated Vital Signs BP (!) 146/68 (BP Location: Right Arm) Comment (BP Location): large cuff  Pulse 96   Temp 98.7 F (37.1  C) (Oral)   Resp 20   LMP 06/27/2020   SpO2 100%   Visual Acuity Right Eye Distance:   Left Eye Distance:   Bilateral Distance:    Right Eye Near:   Left Eye Near:    Bilateral Near:     Physical Exam Vitals and nursing note reviewed.  Constitutional:      Appearance: Normal appearance. She is not ill-appearing.  HENT:     Head: Atraumatic.  Eyes:     Extraocular Movements: Extraocular movements intact.     Conjunctiva/sclera: Conjunctivae normal.  Cardiovascular:     Rate and Rhythm: Normal rate and regular rhythm.     Heart sounds: Normal heart sounds.  Pulmonary:     Effort: Pulmonary effort is normal.     Breath sounds: Normal breath sounds.  Musculoskeletal:        General: Swelling, tenderness and signs of injury present. No deformity. Normal range of motion.     Cervical back: Normal range of motion and neck supple.     Comments: Left lateral ankle edematous, bruised, significantly tender to palpation.  Range of motion overall intact but very painful to make movements at the ankle, particularly inverting.  Skin:    General: Skin is warm and dry.     Findings: Bruising present.  Neurological:     Mental Status: She is alert and oriented to person, place, and time.     Motor: No weakness.     Comments: Left lower extremity neurovascularly intact  Psychiatric:        Mood and Affect: Mood normal.        Thought Content: Thought content normal.        Judgment: Judgment normal.   UC Treatments / Results  Labs (all labs ordered are listed, but only abnormal results are displayed) Labs Reviewed - No data to display  EKG   Radiology DG Ankle Complete Left  Result Date: 07/08/2020 CLINICAL DATA:  Fall with pain and swelling left ankle. EXAM: LEFT ANKLE COMPLETE - 3+ VIEW COMPARISON:  None.  FINDINGS: There is no evidence of fracture, dislocation, or joint effusion. There is no evidence of arthropathy or other focal bone abnormality. Soft tissues are unremarkable. IMPRESSION: Negative. Electronically Signed   By: Misty Stanley M.D.   On: 07/08/2020 14:25    Procedures Procedures (including critical care time)  Medications Ordered in UC Medications - No data to display  Initial Impression / Assessment and Plan / UC Course  I have reviewed the triage vital signs and the nursing notes.  Pertinent labs & imaging results that were available during my care of the patient were reviewed by me and considered in my medical decision making (see chart for details).     X-ray today negative for bony deformity.  We will place an Ace wrap and provide crutches for comfort.  Discussed RICE protocol, over-the-counter pain relievers, sports med follow-up if not significantly improving over the next week or so.  Work note given for rest.  Return for acutely worsening symptoms at any time.  Final Clinical Impressions(s) / UC Diagnoses   Final diagnoses:  Sprain of left ankle, unspecified ligament, initial encounter   Discharge Instructions   None    ED Prescriptions   None    PDMP not reviewed this encounter.   Volney American, [place] 07/08/20 1453

## 2020-07-08 NOTE — ED Triage Notes (Signed)
Patient was stepping down from a step.  Patient was turning at the same time.  Pain in back of ankle and lateral left ankle.  Pedal pulses 2 +.

## 2020-07-12 DIAGNOSIS — M47812 Spondylosis without myelopathy or radiculopathy, cervical region: Secondary | ICD-10-CM | POA: Diagnosis not present

## 2020-07-12 DIAGNOSIS — R209 Unspecified disturbances of skin sensation: Secondary | ICD-10-CM | POA: Diagnosis not present

## 2020-07-12 DIAGNOSIS — Z6835 Body mass index (BMI) 35.0-35.9, adult: Secondary | ICD-10-CM | POA: Diagnosis not present

## 2020-07-12 DIAGNOSIS — M19011 Primary osteoarthritis, right shoulder: Secondary | ICD-10-CM | POA: Diagnosis not present

## 2020-07-12 DIAGNOSIS — M160 Bilateral primary osteoarthritis of hip: Secondary | ICD-10-CM | POA: Diagnosis not present

## 2020-07-12 DIAGNOSIS — Z79899 Other long term (current) drug therapy: Secondary | ICD-10-CM | POA: Diagnosis not present

## 2020-07-12 DIAGNOSIS — F17210 Nicotine dependence, cigarettes, uncomplicated: Secondary | ICD-10-CM | POA: Diagnosis not present

## 2020-07-13 ENCOUNTER — Ambulatory Visit: Payer: Medicaid Other

## 2020-07-13 ENCOUNTER — Other Ambulatory Visit: Payer: Self-pay

## 2020-07-13 ENCOUNTER — Telehealth: Payer: Self-pay

## 2020-07-13 ENCOUNTER — Ambulatory Visit: Payer: Self-pay
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
## 2020-07-13 NOTE — Patient Instructions (Signed)
Visit Information  Misty [name] was given information about Medicaid Managed Care team care coordination services as a part of their [facility] Medicaid benefit. Misty [name] verbally consented to engagement with the [facility] Managed Care team.   For questions related to your [facility] health plan, please call: [phone] or go here:[email or web address]  If you would like to schedule transportation through your [facility] plan, please call the following number at least 2 days in advance of your appointment: [phone].  Call the [place] at [phone], at any time, 24 hours a day, 7 days a week. If you are in danger or need immediate medical attention call 911.  If you would like help to quit smoking, call 1-800-QUIT-NOW 218-649-8037) OR Espaol: 1-855-Djelo-Ya (9-295-747-3403) o para ms informacin haga clic aqu or Text READY to 200-400 to register via text  Misty [name] - following are the goals we discussed in your visit today:   Goals Addressed   None      Social Worker will follow up in  7 days.   Mickel Fuchs, BSW, [place] Managed Medicaid Team  [phone]   Following is a copy of your plan of care:

## 2020-07-21 ENCOUNTER — Ambulatory Visit: Payer: Medicaid Other | Admitting: Physical Therapy

## 2020-07-22 ENCOUNTER — Other Ambulatory Visit: Payer: Self-pay

## 2020-07-22 NOTE — Patient Outreach (Signed)
Care Coordination  07/22/2020  Misty [name] May 04, 1980 [id number]   Medicaid Managed Care   Unsuccessful Outreach Note  07/22/2020 Name: Misty [name] MRN: [id number] DOB: 1980/02/16  Referred by: Pcp, No Reason for referral : High Risk Managed Medicaid (MM Social Work Unsuccessful [employer])   An unsuccessful telephone outreach was attempted today. The patient was referred to the case management team for assistance with care management and care coordination.   Follow Up Plan: The patient has been provided with contact information for the care management team and has been advised to call with any health related questions or concerns.   Mickel Fuchs, BSW, [place] Managed Medicaid Team  [phone]

## 2020-07-22 NOTE — Patient Instructions (Signed)
Visit Information  Ms. HALI BALGOBIN  - as a part of your Medicaid benefit, you are eligible for care management and care coordination services at no cost or copay. I was unable to reach you by phone today but would be happy to help you with your health related needs. Please feel free to call me @ [phone].     Mickel Fuchs, BSW, [place] Managed Medicaid Team  [phone]

## 2020-07-23 ENCOUNTER — Other Ambulatory Visit: Payer: Self-pay | Admitting: Obstetrics and Gynecology

## 2020-07-23 NOTE — Patient Instructions (Signed)
Hi [name], I am sorry I missed you today- as a part of your Medicaid benefit, you are eligible for care management and care coordination services at no cost or copay. I was unable to reach you by phone today but would be happy to help you with your health related needs. Please feel free to call me at [phone].  A member of the Managed Medicaid care management team will reach out to you again over the next 7-14 days.   [name] RN, BSN   [facility] - Managed Medicaid High Risk [phone].

## 2020-07-23 NOTE — Patient Outreach (Signed)
Care Coordination  07/23/2020  [name] 1980/02/04 [id number]   Medicaid Managed Care   Unsuccessful Outreach Note  07/23/2020 Name: Misty [name] MRN: [id number] DOB: [birth year]  Referred by: Pcp, No Reason for referral : High Risk Managed Medicaid (Unsuccessful telephone outreach)   An unsuccessful telephone outreach was attempted today. The patient was referred to the case management team for assistance with care management and care coordination.   Follow Up Plan: A member of the Managed Medicaid care management team will reach out to the patient again over the next 7-14 days.   [name] RN, BSN [place]  [facility] - Managed Medicaid High Risk [phone].

## 2020-07-27 ENCOUNTER — Ambulatory Visit: Payer: Medicaid Other | Attending: Family Medicine

## 2020-08-02 DIAGNOSIS — Z419 Encounter for procedure for purposes other than remedying health state, unspecified: Secondary | ICD-10-CM | POA: Diagnosis not present

## 2020-08-12 DIAGNOSIS — F17210 Nicotine dependence, cigarettes, uncomplicated: Secondary | ICD-10-CM | POA: Diagnosis not present

## 2020-08-12 DIAGNOSIS — M160 Bilateral primary osteoarthritis of hip: Secondary | ICD-10-CM | POA: Diagnosis not present

## 2020-08-12 DIAGNOSIS — Z79899 Other long term (current) drug therapy: Secondary | ICD-10-CM | POA: Diagnosis not present

## 2020-08-12 DIAGNOSIS — Z6835 Body mass index (BMI) 35.0-35.9, adult: Secondary | ICD-10-CM | POA: Diagnosis not present

## 2020-08-12 DIAGNOSIS — M47812 Spondylosis without myelopathy or radiculopathy, cervical region: Secondary | ICD-10-CM | POA: Diagnosis not present

## 2020-08-12 DIAGNOSIS — R209 Unspecified disturbances of skin sensation: Secondary | ICD-10-CM | POA: Diagnosis not present

## 2020-08-12 DIAGNOSIS — M170 Bilateral primary osteoarthritis of knee: Secondary | ICD-10-CM | POA: Diagnosis not present

## 2020-08-18 ENCOUNTER — Other Ambulatory Visit: Payer: Self-pay | Admitting: Obstetrics and Gynecology

## 2020-08-18 ENCOUNTER — Other Ambulatory Visit: Payer: Self-pay
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
## 2020-08-19 ENCOUNTER — Other Ambulatory Visit: Payer: Self-pay

## 2020-08-19 NOTE — Patient Instructions (Signed)
Visit Information  Misty [name] was given information about Medicaid Managed Care team care coordination services as a part of their [facility] Medicaid benefit. Misty [name] verbally consented to engagement with the [facility] Managed Care team.   If you are experiencing a medical emergency, please call 911 or report to your local emergency department or urgent care.   If you have a non-emergency medical problem during routine business hours, please contact your provider's office and ask to speak with a nurse.   For questions related to your [facility] health plan, please call: [phone] or go here:[email or web address]  If you would like to schedule transportation through your [facility] plan, please call the following number at least 2 days in advance of your appointment: [phone].  Call the [place] at [phone], at any time, 24 hours a day, 7 days a week. If you are in danger or need immediate medical attention call 911.  If you would like help to quit smoking, call 1-800-QUIT-NOW 936-484-2337) OR Espaol: 1-855-Djelo-Ya (2-542-706-2376) o para ms informacin haga clic aqu or Text READY to 200-400 to register via text  Misty [name] - following are the goals we discussed in your visit today:   Goals Addressed   None      Social Worker will follow up with patient once PCP appointment has been made.   Mickel Fuchs, BSW, [place] Managed Medicaid Team  [phone]   Following is a copy of your plan of care:

## 2020-08-19 NOTE — Patient Outreach (Signed)
Medicaid Managed Care Social Work Note  08/19/2020 Name:  Misty [name] MRN:  [id number] DOB:  [birth year]  [name] is an 40 y.o. year old female who is a primary patient of Pcp, No.  The Medicaid Managed Care Coordination team was consulted for assistance with:   PCP assistane  [name] was given information about Medicaid Managed Care Coordination team services today. [place] Patient agreed to services and verbal consent obtained.  Engaged with patient  for by telephone forfollow up visit in response to referral for case management and/or care coordination services.   Assessments/Interventions:  Review of past medical history, allergies, medications, health status, including review of consultants reports, laboratory and other test data, was performed as part of comprehensive evaluation and provision of chronic care management services.  SDOH: (Social Determinant of Health) assessments and interventions performed:  BSW contacted patient to inform her that BSW could assist with getting patient scheduled at the Patient [place]. BSW contact Patient [place] but did not get an answer. BSW will contact them again on 08/23/20.   Advanced Directives Status:  Not addressed in this encounter.  Care Plan                 No Known Allergies  Medications Reviewed Today     Reviewed by [name], RN (Registered Nurse) on 08/18/20 at 1454  Med List Status: <None>   Medication Order Taking? Sig Documenting Provider Last Dose Status Informant  Accu-Chek FastClix Lancets MISC [id number] Yes Use twice per day to check your blood glucose.  E11.65 Jose Persia, MD Taking Active Self  diclofenac sodium (VOLTAREN) 1 % GEL [id number]  Apply 2 g topically 4 (four) times daily.  Patient not taking: No sig reported   [place], Sophia, PA-C  Active   DULoxetine (CYMBALTA) 30 MG capsule [id number]  Take 1 capsule (30 mg total) by mouth at  bedtime. Take 1 tab nightly x 1 week then 2 tabs nightly- for nerve pain Lovorn, Megan, MD  Expired 11/25/19 2359   gabapentin (NEURONTIN) 300 MG capsule [id number] No Take 300 mg by mouth at bedtime.  Patient not taking: Reported on 08/18/2020   [provider] Not Taking Active            Med Note [name], [name]   Sun Oct 06, 2018  8:29 AM)    glucose blood (ACCU-CHEK GUIDE) test strip [id number] Yes Use one strip, twice daily to check your blood glucose.  E11.65 Jose Persia, MD Taking Active Self  LANTUS SOLOSTAR 100 UNIT/ML Solostar Pen [id number] Yes SMARTSIG:10 Unit(s) SUB-Q Morning-Night [provider] Taking Active   lisinopril (ZESTRIL) 10 MG tablet [id number]  Take 10 mg by mouth daily.  Patient not taking: Reported on 07/08/2020   [provider]  Active   methocarbamol (ROBAXIN) 500 MG tablet [id number] Yes Take 500 mg by mouth 4 (four) times daily. [provider] Taking Active   oxyCODONE-acetaminophen (PERCOCET/ROXICET) 5-325 MG tablet [id number] Yes Take 1 tablet by mouth every 8 (eight) hours as needed for severe pain. Takes 4 times a day [provider] Taking Active Self  pioglitazone (ACTOS) 30 MG tablet [id number]  Take 1 tablet (30 mg total) by mouth daily. Jose Persia, MD  Active   prazosin (MINIPRESS) 2 MG capsule [id number]  Take 2 mg by mouth at bedtime.  Patient not taking: Reported on 07/08/2020   [provider]  Active   predniSONE (DELTASONE) 5 MG tablet  [id number]  Take by mouth daily with breakfast.  Patient not taking: Reported on 07/08/2020   [provider]  Active   sertraline (ZOLOFT) 50 MG tablet [id number]  Take 50 mg by mouth daily.  Patient not taking: No sig reported   [provider]  Active   sitaGLIPtin (JANUVIA) 100 MG tablet [id number]  Take 1 tablet (100 mg total) by mouth daily.  Patient not taking: No sig reported   [name], MD  Active   traZODone (DESYREL) 50 MG  tablet [id number]  Take 1 tablet (50 mg total) by mouth at bedtime.  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[place]) 08/17/2017   Morbid obesity ([place]) 08/17/2017   Hypokalemia 08/17/2017   Headache 02/28/2017   Heart palpitations 06/04/2012   Tobacco use 06/03/2012   Preventative health care 05/03/2011   Diabetes type 2, uncontrolled ([place]) 02/24/2008   Polycystic ovarian disease 07/27/2006   GERD 03/02/2006    Conditions to be addressed/monitored per PCP order:   PCP  There are no care plans that you recently modified to display for this patient.   Follow up:  Patient agrees to Care Plan and Follow-up.  Plan: The Managed Medicaid care management team will reach out to the patient again over the next 5 days.  Date/time of next scheduled Social Work care management/care coordination outreach:  08/23/20  Mickel Fuchs, [place] Miss, [place] Medicaid Team  [phone]

## 2020-08-23 ENCOUNTER — Other Ambulatory Visit: Payer: Self-pay

## 2020-08-23 NOTE — Patient Outreach (Signed)
Medicaid Managed Care Social Work Note  08/23/2020 Name:  Misty [name] MRN:  [id number] DOB:  1980/08/12  Misty [name] is an 40 y.o. year old female who is a primary Misty of Pcp, No.  The Medicaid Managed Care Coordination team was consulted for assistance with:   PCP  [name] was given information about Medicaid Managed Care Coordination team services today. Burton Misty agreed to services and verbal consent obtained.  Engaged with Misty  for by telephone forfollow up visit in response to referral for case management and/or care coordination services.   Assessments/Interventions:  Review of past medical history, allergies, medications, health status, including review of consultants reports, laboratory and other test data, was performed as part of comprehensive evaluation and provision of chronic care management services.  SDOH: (Social Determinant of Health) assessments and interventions performed:  BSW contacted Misty [name] to schedule appointment and did not get an answer. BSW left voicemail for a telephone call back. BSW will contact Misty once appointment has been scheduled.   Advanced Directives Status:  Not addressed in this encounter.  Care Plan                 No Known Allergies  Medications Reviewed Today     Reviewed by [name], RN (Registered Nurse) on 08/18/20 at 1454  Med List Status: <None>   Medication Order Taking? Sig Documenting Provider Last Dose Status Informant  Accu-Chek FastClix Lancets MISC [id number] Yes Use twice per day to check your blood glucose.  E11.65 Jose Persia, MD Taking Active Self  diclofenac sodium (VOLTAREN) 1 % GEL [id number]  Apply 2 g topically 4 (four) times daily.  Misty not taking: No sig reported   Caccavale, Sophia, PA-C  Active   DULoxetine (CYMBALTA) 30 MG capsule [id number]  Take 1 capsule (30 mg total) by mouth at bedtime. Take 1 tab nightly x 1  week then 2 tabs nightly- for nerve pain Lovorn, Megan, MD  Expired 11/25/19 2359   gabapentin (NEURONTIN) 300 MG capsule [id number] No Take 300 mg by mouth at bedtime.  Misty not taking: Reported on 08/18/2020   [provider] Not Taking Active            Med Note [name], [name]   Sun Oct 06, 2018  8:29 AM)    glucose blood (ACCU-CHEK GUIDE) test strip [id number] Yes Use one strip, twice daily to check your blood glucose.  E11.65 Jose Persia, MD Taking Active Self  LANTUS SOLOSTAR 100 UNIT/ML Solostar Pen [id number] Yes SMARTSIG:10 Unit(s) SUB-Q Morning-Night [provider] Taking Active   lisinopril (ZESTRIL) 10 MG tablet [id number]  Take 10 mg by mouth daily.  Misty not taking: Reported on 07/08/2020   [provider]  Active   methocarbamol (ROBAXIN) 500 MG tablet [id number] Yes Take 500 mg by mouth 4 (four) times daily. [provider] Taking Active   oxyCODONE-acetaminophen (PERCOCET/ROXICET) 5-325 MG tablet [id number] Yes Take 1 tablet by mouth every 8 (eight) hours as needed for severe pain. Takes 4 times a day [provider] Taking Active Self  pioglitazone (ACTOS) 30 MG tablet [id number]  Take 1 tablet (30 mg total) by mouth daily. Jose Persia, MD  Active   prazosin (MINIPRESS) 2 MG capsule [id number]  Take 2 mg by mouth at bedtime.  Misty not taking: Reported on 07/08/2020   [provider]  Active   predniSONE (DELTASONE) 5 MG tablet [id number]  Take by mouth daily with  breakfast.  Misty not taking: Reported on 07/08/2020   [provider]  Active   sertraline (ZOLOFT) 50 MG tablet [id number]  Take 50 mg by mouth daily.  Misty not taking: No sig reported   [provider]  Active   sitaGLIPtin (JANUVIA) 100 MG tablet [id number]  Take 1 tablet (100 mg total) by mouth daily.  Misty not taking: No sig reported   [name], MD  Active   traZODone (DESYREL) 50 MG tablet [id number]  Take 1 tablet  (50 mg total) by mouth at bedtime.  Misty not taking: Reported on 06/23/2020   Jose Persia, MD  Active Self            Misty Active Problem List   Diagnosis Date Noted   Myofascial pain dysfunction syndrome 11/25/2018   Nerve pain 11/25/2018   Hypertension 09/19/2018   Insomnia 09/19/2018   Hematuria 09/19/2018   Left ankle pain 06/25/2018   Lumbar spine pain 06/25/2018   Thoracic spine pain 06/25/2018   Cervical spine pain 06/25/2018   Hidradenitis suppurativa 01/18/2018   History of diverticulitis 10/03/2017   Constipation 10/03/2017   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[place]) 08/17/2017   Morbid obesity ([place]) 08/17/2017   Hypokalemia 08/17/2017   Headache 02/28/2017   Heart palpitations 06/04/2012   Tobacco use 06/03/2012   Preventative health care 05/03/2011   Diabetes type 2, uncontrolled ([place]) 02/24/2008   Polycystic ovarian disease 07/27/2006   GERD 03/02/2006    Conditions to be addressed/monitored per PCP order:   PCP  There are no care plans that you recently modified to display for this Misty.   Follow up:  Misty agrees to Care Plan and Follow-up.  Plan: The Managed Medicaid care management team will reach out to the Misty again over the next 5 days.  Date/time of next scheduled Social Work care management/care coordination outreach:  08/27/20  Mickel Fuchs, Arita Miss, [place] Medicaid Team  [phone]

## 2020-08-23 NOTE — Patient Instructions (Signed)
Visit Information  [name] was given information about Medicaid Managed Care team care coordination services as a part of their [facility] Medicaid benefit. [name] verbally consented to engagement with the [facility] Managed Care team.   If you are experiencing a medical emergency, please call 911 or report to your local emergency department or urgent care.   If you have a non-emergency medical problem during routine business hours, please contact your provider's office and ask to speak with a nurse.   For questions related to your [facility] health plan, please call: [phone] or go here:[email or web address]  If you would like to schedule transportation through your [facility] plan, please call the following number at least 2 days in advance of your appointment: [phone].  Call the [place] at [phone], at any time, 24 hours a day, 7 days a week. If you are in danger or need immediate medical attention call 911.  If you would like help to quit smoking, call 1-800-QUIT-NOW (424)045-0976) OR Espaol: 1-855-Djelo-Ya (5-396-728-9791) o para ms informacin haga clic aqu or Text READY to 200-400 to register via text  [name] - following are the goals we discussed in your visit today:   Goals Addressed   None       Social Worker will follow up with patient once appointment has been made.   Mickel Fuchs, BSW, [place] Managed Medicaid Team  [phone]   Following is a copy of your plan of care:

## 2020-08-27 ENCOUNTER — Other Ambulatory Visit: Payer: Self-pay

## 2020-08-27 NOTE — Patient Instructions (Signed)
Visit Information  Misty [name]  - as a part of your Medicaid benefit, you are eligible for care management and care coordination services at no cost or copay. I was unable to reach you by phone today but would be happy to help you with your health related needs. Please feel free to call me @ [phone].   A member of the Managed Medicaid care management team will reach out to you again over the next 2 days.   Mickel Fuchs, BSW, [place] Managed Medicaid Team  [phone]

## 2020-08-27 NOTE — Patient Outreach (Addendum)
Care Coordination  08/27/2020  MADLYNN LUNDEEN 1980-03-21 [id number]   Medicaid Managed Care   Unsuccessful Outreach Note  08/27/2020 Name: MELADY CHOW MRN: [id number] DOB: February 15, 1980  Referred by: Pcp, No Reason for referral : High Risk Managed Medicaid (MM Social Work unsuccessful [employer])   An unsuccessful telephone outreach was attempted today. The patient was referred to the case management team for assistance with care management and care coordination.   BSW was able to schedule patient a new PCP appointment with the Patient [place] for 09/01/20 at 10am.  Follow Up Plan: The care management team will reach out to the patient again over the next 2 days.   Mickel Fuchs, BSW, [place] Managed Medicaid Team  [phone]

## 2020-09-01 ENCOUNTER — Ambulatory Visit: Payer: Medicaid Other | Admitting: Nurse Practitioner

## 2020-09-02 DIAGNOSIS — Z419 Encounter for procedure for purposes other than remedying health state, unspecified: Secondary | ICD-10-CM | POA: Diagnosis not present

## 2020-09-07 ENCOUNTER — Other Ambulatory Visit: Payer: Self-pay
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
## 2020-09-13 DIAGNOSIS — F17210 Nicotine dependence, cigarettes, uncomplicated: Secondary | ICD-10-CM | POA: Diagnosis not present

## 2020-09-13 DIAGNOSIS — M160 Bilateral primary osteoarthritis of hip: Secondary | ICD-10-CM | POA: Diagnosis not present

## 2020-09-13 DIAGNOSIS — R209 Unspecified disturbances of skin sensation: Secondary | ICD-10-CM | POA: Diagnosis not present

## 2020-09-13 DIAGNOSIS — Z6835 Body mass index (BMI) 35.0-35.9, adult: Secondary | ICD-10-CM | POA: Diagnosis not present

## 2020-09-13 DIAGNOSIS — Z1159 Encounter for screening for other viral diseases: Secondary | ICD-10-CM | POA: Diagnosis not present

## 2020-09-13 DIAGNOSIS — E118 Type 2 diabetes mellitus with unspecified complications: Secondary | ICD-10-CM | POA: Diagnosis not present

## 2020-09-13 DIAGNOSIS — Z0000 Encounter for general adult medical examination without abnormal findings: Secondary | ICD-10-CM | POA: Diagnosis not present

## 2020-09-13 DIAGNOSIS — Z79899 Other long term (current) drug therapy: Secondary | ICD-10-CM | POA: Diagnosis not present

## 2020-09-13 DIAGNOSIS — Z114 Encounter for screening for human immunodeficiency virus [HIV]: Secondary | ICD-10-CM | POA: Diagnosis not present

## 2020-09-13 DIAGNOSIS — E7800 Pure hypercholesterolemia, unspecified: Secondary | ICD-10-CM | POA: Diagnosis not present

## 2020-09-13 DIAGNOSIS — R5383 Other fatigue: Secondary | ICD-10-CM | POA: Diagnosis not present

## 2020-09-13 DIAGNOSIS — E559 Vitamin D deficiency, unspecified: Secondary | ICD-10-CM | POA: Diagnosis not present

## 2020-09-13 DIAGNOSIS — N926 Irregular menstruation, unspecified: Secondary | ICD-10-CM | POA: Diagnosis not present

## 2020-09-13 DIAGNOSIS — Z7251 High risk heterosexual behavior: Secondary | ICD-10-CM | POA: Diagnosis not present

## 2020-09-14 ENCOUNTER — Other Ambulatory Visit: Payer: Self-pay | Admitting: Obstetrics and Gynecology

## 2020-09-14 NOTE — Patient Outreach (Signed)
Care Coordination  09/14/2020  Deangela Randleman Pierce-Younger 23-Oct-1980 [id number]   Medicaid Managed Care   Unsuccessful Outreach Note  09/14/2020 Name: ZOHAL RENY MRN: [id number] DOB: [birth year]  Referred by: Pcp, No Reason for referral : High Risk Managed Medicaid (Unsuccessful telephone outreach)   An unsuccessful telephone outreach was attempted today. The patient was referred to the case management team for assistance with care management and care coordination.   Follow Up Plan: The care management team will reach out to the patient again over the next 7-14 days.   [name] RN, BSN [place]  [facility] Curator - Managed Medicaid High Risk [phone].

## 2020-09-14 NOTE — Patient Instructions (Signed)
Hi [name], sorry I missed you today  - as a part of your Medicaid benefit, you are eligible for care management and care coordination services at no cost or copay. I was unable to reach you by phone today but would be happy to help you with your health related needs. Please feel free to call me at [phone].  A member of the Managed Medicaid care management team will reach out to you again over the next 7-14 days.   [name] RN, BSN [place]  [employer] Curator - Managed Medicaid High Risk [phone].

## 2020-09-16 DIAGNOSIS — R109 Unspecified abdominal pain: Secondary | ICD-10-CM | POA: Diagnosis not present

## 2020-09-28 ENCOUNTER — Other Ambulatory Visit: Payer: Self-pay | Admitting: Family Medicine

## 2020-09-28 DIAGNOSIS — Z1231 Encounter for screening mammogram for malignant neoplasm of breast: Secondary | ICD-10-CM

## 2020-09-29 DIAGNOSIS — K760 Fatty (change of) liver, not elsewhere classified: Secondary | ICD-10-CM | POA: Diagnosis not present

## 2020-09-29 DIAGNOSIS — R945 Abnormal results of liver function studies: Secondary | ICD-10-CM | POA: Diagnosis not present

## 2020-10-02 DIAGNOSIS — Z419 Encounter for procedure for purposes other than remedying health state, unspecified: Secondary | ICD-10-CM | POA: Diagnosis not present

## 2020-10-12 DIAGNOSIS — R209 Unspecified disturbances of skin sensation: Secondary | ICD-10-CM | POA: Diagnosis not present

## 2020-10-12 DIAGNOSIS — M47812 Spondylosis without myelopathy or radiculopathy, cervical region: Secondary | ICD-10-CM | POA: Diagnosis not present

## 2020-10-12 DIAGNOSIS — M170 Bilateral primary osteoarthritis of knee: Secondary | ICD-10-CM | POA: Diagnosis not present

## 2020-10-12 DIAGNOSIS — M160 Bilateral primary osteoarthritis of hip: Secondary | ICD-10-CM | POA: Diagnosis not present

## 2020-10-12 DIAGNOSIS — Z23 Encounter for immunization: Secondary | ICD-10-CM | POA: Diagnosis not present

## 2020-10-12 DIAGNOSIS — F17210 Nicotine dependence, cigarettes, uncomplicated: Secondary | ICD-10-CM | POA: Diagnosis not present

## 2020-10-12 DIAGNOSIS — Z79899 Other long term (current) drug therapy: Secondary | ICD-10-CM | POA: Diagnosis not present

## 2020-10-12 DIAGNOSIS — Z6835 Body mass index (BMI) 35.0-35.9, adult: Secondary | ICD-10-CM | POA: Diagnosis not present

## 2020-10-14 ENCOUNTER — Other Ambulatory Visit: Payer: Self-pay | Admitting: Obstetrics and Gynecology
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
## 2020-10-15 ENCOUNTER — Other Ambulatory Visit: Payer: Self-pay

## 2020-10-15 NOTE — Patient Instructions (Signed)
Visit Information  Misty [name] was given information about Medicaid Managed Care team care coordination services as a part of their [facility] Medicaid benefit. Misty [name] verbally consented to engagement with the [facility] Managed Care team.   If you are experiencing a medical emergency, please call 911 or report to your local emergency department or urgent care.   If you have a non-emergency medical problem during routine business hours, please contact your provider's office and ask to speak with a nurse.   For questions related to your [facility] health plan, please call: [phone] or go here:[email or web address]  If you would like to schedule transportation through your [facility] plan, please call the following number at least 2 days in advance of your appointment: [phone].  Call the [place] at [phone], at any time, 24 hours a day, 7 days a week. If you are in danger or need immediate medical attention call 911.  If you would like help to quit smoking, call 1-800-QUIT-NOW 318-352-9264) OR Espaol: 1-855-Djelo-Ya (9-688-648-4720) o para ms informacin haga clic aqu or Text READY to 200-400 to register via text  Misty [name] - following are the goals we discussed in your visit today:   Goals Addressed   None       Social Worker will follow up in 30 days.   Mickel Fuchs, BSW, [place] Managed Medicaid Team  [phone]   Following is a copy of your plan of care:

## 2020-10-15 NOTE — Patient Outreach (Signed)
Medicaid Managed Care Social Work Note  10/15/2020 Name:  Misty [name] MRN:  [id number] DOB:  [birth year]  Misty [name] is an 40 y.o. year old female who is a primary patient of Pcp, No.  The Medicaid Managed Care Coordination team was consulted for assistance with:  [employer]   [name] was given information about Medicaid Managed Care Coordination team services today. [place] Patient agreed to services and verbal consent obtained.  Engaged with patient  for by telephone forfollow up visit in response to referral for case management and/or care coordination services.   Assessments/Interventions:  Review of past medical history, allergies, medications, health status, including review of consultants reports, laboratory and other test data, was performed as part of comprehensive evaluation and provision of chronic care management services.  SDOH: (Social Determinant of Health) assessments and interventions performed:  BSW contacted patient about [employer] resources. Patient stated she does not have a cut off notice yet. BSW will send patient resources via email to [email or web address]  Advanced Directives Status:  Not addressed in this encounter.  Care Plan                 No Known Allergies  Medications Reviewed Today     Reviewed by [name], RN (Registered Nurse) on 10/14/20 at [phone]  Med List Status: <None>   Medication Order Taking? Sig Documenting Provider Last Dose Status Informant  Accu-Chek FastClix Lancets MISC [id number] Yes Use twice per day to check your blood glucose.  E11.65 Jose Persia, MD Taking Active Self  diclofenac sodium (VOLTAREN) 1 % GEL [id number]  Apply 2 g topically 4 (four) times daily.  Patient not taking: No sig reported   [place], Sophia, PA-C  Active   DULoxetine (CYMBALTA) 30 MG capsule [id number]  Take 1 capsule (30 mg total) by mouth at bedtime. Take 1 tab nightly x 1 week  then 2 tabs nightly- for nerve pain Lovorn, Jinny Blossom, MD  Expired 11/25/19 2359   gabapentin (NEURONTIN) 300 MG capsule [id number]  Take 300 mg by mouth at bedtime.  Patient not taking: Reported on 08/18/2020   [provider]  Active            Med Note ([place]   Sun Oct 06, 2018  8:29 AM)    glucose blood (ACCU-CHEK GUIDE) test strip [id number] Yes Use one strip, twice daily to check your blood glucose.  E11.65 Jose Persia, MD Taking Active Self  LANTUS SOLOSTAR 100 UNIT/ML Solostar Pen [id number] Yes SMARTSIG:10 Unit(s) SUB-Q Morning-Night [provider] Taking Active   lisinopril (ZESTRIL) 10 MG tablet [id number]  Take 10 mg by mouth daily.  Patient not taking: Reported on 07/08/2020   [provider]  Active   methocarbamol (ROBAXIN) 500 MG tablet [id number] No Take 500 mg by mouth 4 (four) times daily.  Patient not taking: Reported on 10/14/2020   [provider] Not Taking Active   oxyCODONE-acetaminophen (PERCOCET/ROXICET) 5-325 MG tablet [id number] Yes Take 1 tablet by mouth every 8 (eight) hours as needed for severe pain. Takes 4 times a day [provider] Taking Active Self  pioglitazone (ACTOS) 30 MG tablet [id number] No Take 1 tablet (30 mg total) by mouth daily.  Patient not taking: Reported on 10/14/2020   Jose Persia, MD Not Taking Active   prazosin (MINIPRESS) 2 MG capsule [id number]  Take 2 mg by mouth at bedtime.  Patient not taking: Reported on 07/08/2020   [provider]  Active  predniSONE (DELTASONE) 5 MG tablet [id number]  Take by mouth daily with breakfast.  Patient not taking: Reported on 07/08/2020   [provider]  Active   pregabalin (LYRICA) 75 MG capsule [id number] Yes Take 75 mg by mouth 2 (two) times daily. [provider] Taking Active Self  sertraline (ZOLOFT) 50 MG tablet [id number]  Take 50 mg by mouth daily.  Patient not taking: No sig reported   [provider]  Active    sitaGLIPtin (JANUVIA) 100 MG tablet [id number]  Take 1 tablet (100 mg total) by mouth daily.  Patient not taking: No sig reported   [name], MD  Active   traZODone (DESYREL) 50 MG tablet [id number]  Take 1 tablet (50 mg total) by mouth at bedtime.  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[place]) 08/17/2017   Morbid obesity ([place]) 08/17/2017   Hypokalemia 08/17/2017   Headache 02/28/2017   Heart palpitations 06/04/2012   Tobacco use 06/03/2012   Preventative health care 05/03/2011   Diabetes type 2, uncontrolled 02/24/2008   Polycystic ovarian disease 07/27/2006   GERD 03/02/2006    Conditions to be addressed/monitored per PCP order:   resources  There are no care plans that you recently modified to display for this patient.   Follow up:  Patient agrees to Care Plan and Follow-up.  Plan: The Managed Medicaid care management team will reach out to the patient again over the next 30 days.  Date/time of next scheduled Social Work care management/care coordination outreach:  11/15/20  Mickel Fuchs, Arita Miss, [place] Medicaid Team  [phone]

## 2020-10-25 ENCOUNTER — Other Ambulatory Visit: Payer: Self-pay | Admitting: Licensed Clinical Social Worker

## 2020-10-25 DIAGNOSIS — F411 Generalized anxiety disorder: Secondary | ICD-10-CM

## 2020-10-25 DIAGNOSIS — G4700 Insomnia, unspecified: Secondary | ICD-10-CM

## 2020-10-25 NOTE — Patient Instructions (Signed)
Visit Information  Misty [name] was given information about Medicaid Managed Care team care coordination services as a part of their [facility] Medicaid benefit. Misty [name] verbally consented to engagement with the [facility] Managed Care team.   If you are experiencing a medical emergency, please call 911 or report to your local emergency department or urgent care.   If you have a non-emergency medical problem during routine business hours, please contact your provider's office and ask to speak with a nurse.   For questions related to your [facility] health plan, please call: [phone] or go here:[email or web address]  If you would like to schedule transportation through your [facility] plan, please call the following number at least 2 days in advance of your appointment: [phone].  Call the [place] at [phone], at any time, 24 hours a day, 7 days a week. If you are in danger or need immediate medical attention call 911.  If you would like help to quit smoking, call 1-800-QUIT-NOW 458-865-3028) OR Espaol: 1-855-Djelo-Ya (6-384-665-9935) o para ms informacin haga clic aqu or Text READY to 200-400 to register via text  Misty [name] - following are the goals we discussed in your visit today:   Goals Addressed               This Visit's Progress     Manage My Emotions (pt-stated)        Timeframe:  Long-Range Goal Priority:  High Start Date:  10/25/20                           Expected End Date: ongoing                      Follow Up Date 11/10/20   - begin personal counseling - call and visit an old friend - check out volunteer opportunities - join a support group - laugh; watch a funny movie or comedian - learn and use visualization or guided imagery - perform a random act of kindness - practice relaxation or meditation daily - start or continue a personal journal - talk about feelings with  a friend, family or spiritual advisor - practice positive thinking and self-talk    Why is this important?   When you are stressed, down or upset, your body reacts too.  For example, your blood pressure may get higher; you may have a headache or stomachache.  When your emotions get the best of you, your body's ability to fight off cold and flu gets weak.  These steps will help you manage your emotions.     Notes:         Eula Fried, BSW, MSW, [employer] Managed Medicaid LCSW [email or web address] Phone: [phone]
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
## 2020-10-26 ENCOUNTER — Ambulatory Visit: Payer: Medicaid Other

## 2020-11-02 DIAGNOSIS — Z419 Encounter for procedure for purposes other than remedying health state, unspecified: Secondary | ICD-10-CM | POA: Diagnosis not present

## 2020-11-03 ENCOUNTER — Telehealth: Payer: Self-pay | Admitting: Certified Nurse Midwife

## 2020-11-04 NOTE — Telephone Encounter (Signed)
Informed patient of appointment.

## 2020-11-08 ENCOUNTER — Other Ambulatory Visit: Payer: Self-pay

## 2020-11-08 ENCOUNTER — Emergency Department (HOSPITAL_COMMUNITY): Payer: Medicaid Other

## 2020-11-08 ENCOUNTER — Encounter (HOSPITAL_COMMUNITY): Payer: Self-pay | Admitting: Emergency Medicine

## 2020-11-08 ENCOUNTER — Emergency Department (HOSPITAL_COMMUNITY)
Admission: EM | Admit: 2020-11-08 | Discharge: 2020-11-08 | Disposition: A | Discharge status: Home / Self Care | Payer: Medicaid Other | Attending: Emergency Medicine | Admitting: Emergency Medicine

## 2020-11-08 DIAGNOSIS — Z5321 Procedure and treatment not carried out due to patient leaving prior to being seen by health care provider: Secondary | ICD-10-CM | POA: Insufficient documentation

## 2020-11-08 DIAGNOSIS — R0789 Other chest pain: Secondary | ICD-10-CM | POA: Diagnosis not present

## 2020-11-08 DIAGNOSIS — R079 Chest pain, unspecified: Secondary | ICD-10-CM | POA: Diagnosis not present

## 2020-11-08 DIAGNOSIS — R0602 Shortness of breath: Secondary | ICD-10-CM | POA: Insufficient documentation

## 2020-11-08 LAB — BASIC METABOLIC PANEL
Anion gap: 9 (ref 5–15)
BUN: 6 mg/dL (ref 6–20)
CO2: 24 mmol/L (ref 22–32)
Calcium: 8.4 mg/dL — ABNORMAL LOW (ref 8.9–10.3)
Chloride: 103 mmol/L (ref 98–111)
Creatinine, Ser: 0.66 mg/dL (ref 0.44–1.00)
GFR, Estimated: >60 mL/min (ref >60)
Glucose, Bld: 291 mg/dL — ABNORMAL HIGH (ref 70–99)
Potassium: 3.6 mmol/L (ref 3.5–5.1)
Sodium: 136 mmol/L (ref 135–145)

## 2020-11-08 LAB — CBC
HCT: 37.1 % (ref 36.0–46.0)
Hemoglobin: 11.9 g/dL — ABNORMAL LOW (ref 12.0–15.0)
MCH: 27.4 pg (ref 26.0–34.0)
MCHC: 32.1 g/dL (ref 30.0–36.0)
MCV: 85.5 fL (ref 80.0–100.0)
Platelets: 214 10*3/uL (ref 150–400)
RBC: 4.34 MIL/uL (ref 3.87–5.11)
RDW: 13.1 % (ref 11.5–15.5)
WBC: 10 10*3/uL (ref 4.0–10.5)
nRBC: 0 % (ref 0.0–0.2)

## 2020-11-08 LAB — I-STAT BETA HCG BLOOD, ED (MC, WL, AP ONLY): I-stat hCG, quantitative: <5 m[IU]/mL (ref <5)

## 2020-11-08 LAB — TROPONIN I (HIGH SENSITIVITY): Troponin I (High Sensitivity): 4 ng/L (ref <18)

## 2020-11-08 NOTE — ED Notes (Signed)
Pt called multiple times no answer

## 2020-11-08 NOTE — ED Triage Notes (Signed)
Patient reports central chest pain with SOB this evening , no emesis or diaphoresis , denies cough or fever .

## 2020-11-08 NOTE — ED Provider Notes (Signed)
Emergency Medicine Provider Triage Evaluation Note  Misty [name] , a 40 y.o. female  was evaluated in triage.  Pt complains of diffused chest pain characterized as a tight and pressure sensation that has been ongoing and worsening since yesterday.  She reports associated epigastric pain that radiates into the chest.  She also reports associated shortness of breath.  Chest pain radiates into the right arm.  Review of Systems  Positive:  Negative: See above   Physical Exam  BP (!) 146/83 (BP Location: Right Arm)   Pulse 95   Temp 98.4 F (36.9 C) (Oral)   Resp 18   SpO2 100%  Gen:   Awake, no distress, tearful   Resp:  Normal effort  MSK:   Moves extremities without difficulty  Other:    Medical Decision Making  Medically screening exam initiated at 8:25 PM.  Appropriate orders placed.  Misty [name] was informed that the remainder of the evaluation will be completed by another provider, this initial triage assessment does not replace that evaluation, and the importance of remaining in the ED until their evaluation is complete.  Chest pain work-up.  Would likely benefit from GI cocktail.  Could also be GERD.   Hendricks Limes, PA-C 11/08/20 2026    Wyvonnia Dusky, MD 11/08/20 2144

## 2020-11-09 ENCOUNTER — Other Ambulatory Visit: Payer: Self-pay | Admitting: Obstetrics and Gynecology

## 2020-11-09 NOTE — Patient Outreach (Signed)
Care Coordination  11/09/2020  Misty [name] [birth year] [id number]   Medicaid Managed Care   Unsuccessful Outreach Note  11/09/2020 Name: Misty [name] MRN: [id number] DOB: 1980-09-03  Referred by: [facility] [place] Reason for referral : High Risk Managed Medicaid (Unsuccessful telephone outreach)   An unsuccessful telephone outreach was attempted today. The patient was referred to the case management team for assistance with care management and care coordination.   Follow Up Plan: The care management team will reach out to the patient again over the next 7-14 days.   [name] RN, BSN [place]  [facility] - Managed Medicaid High Risk [phone]

## 2020-11-09 NOTE — Patient Instructions (Signed)
Hi [name], I am sorry I missed you today, I hope you are doing okay- as a part of your Medicaid benefit, you are eligible for care management and care coordination services at no cost or copay. I was unable to reach you by phone today but would be happy to help you with your health related needs. Please feel free to call me at [phone].  A member of the Managed Medicaid care management team will reach out to you again over the next 7-14 days.   [name] RN, BSN [place]  [facility] - Managed Medicaid High Risk [phone]

## 2020-11-10 ENCOUNTER — Other Ambulatory Visit: Payer: Self-pay | Admitting: Licensed Clinical Social Worker

## 2020-11-10 DIAGNOSIS — R519 Headache, unspecified: Secondary | ICD-10-CM | POA: Diagnosis not present

## 2020-11-10 DIAGNOSIS — R0602 Shortness of breath: Secondary | ICD-10-CM | POA: Diagnosis not present

## 2020-11-10 DIAGNOSIS — R509 Fever, unspecified: Secondary | ICD-10-CM | POA: Diagnosis not present

## 2020-11-10 DIAGNOSIS — J111 Influenza due to unidentified influenza virus with other respiratory manifestations: Secondary | ICD-10-CM | POA: Diagnosis not present
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
## 2020-11-10 NOTE — Patient Instructions (Signed)
Visit Information  [name] was given information about Medicaid Managed Care team care coordination services as a part of their [facility] Medicaid benefit. [name] verbally consented to engagement with the [facility] Managed Care team.   If you are experiencing a medical emergency, please call 911 or report to your local emergency department or urgent care.   If you have a non-emergency medical problem during routine business hours, please contact your provider's office and ask to speak with a nurse.   For questions related to your [facility] health plan, please call: [phone] or go here:[email or web address]  If you would like to schedule transportation through your [facility] plan, please call the following number at least 2 days in advance of your appointment: [phone].  Call the [place] at [phone], at any time, 24 hours a day, 7 days a week. If you are in danger or need immediate medical attention call 911.  If you would like help to quit smoking, call 1-800-QUIT-NOW 475-361-8527) OR Espaol: 1-855-Djelo-Ya (8-250-539-7673) o para ms informacin haga clic aqu or Text READY to 200-400 to register via text  [name] - following are the goals we discussed in your visit today:   Goals Addressed               This Visit's Progress     Manage My Emotions (pt-stated)        Timeframe:  Long-Range Goal Priority:  High Start Date:  10/25/20                           Expected End Date: ongoing                      Follow Up Date 11/30/20   - begin personal counseling - call and visit an old friend - check out volunteer opportunities - join a support group - laugh; watch a funny movie or comedian - learn and use visualization or guided imagery - perform a random act of kindness - practice relaxation or meditation daily - start or continue a personal journal - talk about feelings with  a friend, family or spiritual advisor - practice positive thinking and self-talk    Why is this important?   When you are stressed, down or upset, your body reacts too.  For example, your blood pressure may get higher; you may have a headache or stomachache.  When your emotions get the best of you, your body's ability to fight off cold and flu gets weak.  These steps will help you manage your emotions.     Notes:         Eula Fried, BSW, MSW, [employer] Managed Medicaid LCSW [email or web address] Phone: [phone]

## 2020-11-11 ENCOUNTER — Encounter: Payer: Medicaid Other | Admitting: Obstetrics and Gynecology

## 2020-11-12 DIAGNOSIS — M47812 Spondylosis without myelopathy or radiculopathy, cervical region: Secondary | ICD-10-CM | POA: Diagnosis not present

## 2020-11-12 DIAGNOSIS — M160 Bilateral primary osteoarthritis of hip: Secondary | ICD-10-CM | POA: Diagnosis not present

## 2020-11-12 DIAGNOSIS — Z6835 Body mass index (BMI) 35.0-35.9, adult: Secondary | ICD-10-CM | POA: Diagnosis not present

## 2020-11-12 DIAGNOSIS — R209 Unspecified disturbances of skin sensation: Secondary | ICD-10-CM | POA: Diagnosis not present

## 2020-11-12 DIAGNOSIS — M170 Bilateral primary osteoarthritis of knee: Secondary | ICD-10-CM | POA: Diagnosis not present

## 2020-11-12 DIAGNOSIS — F17210 Nicotine dependence, cigarettes, uncomplicated: Secondary | ICD-10-CM | POA: Diagnosis not present

## 2020-11-12 DIAGNOSIS — Z79899 Other long term (current) drug therapy: Secondary | ICD-10-CM | POA: Diagnosis not present

## 2020-11-15 ENCOUNTER — Other Ambulatory Visit: Payer: Self-pay

## 2020-11-15 ENCOUNTER — Other Ambulatory Visit: Payer: Self-pay | Admitting: Obstetrics and Gynecology

## 2020-11-15 NOTE — Patient Instructions (Signed)
Visit Information  [name]  - as a part of your Medicaid benefit, you are eligible for care management and care coordination services at no cost or copay. I was unable to reach you by phone today but would be happy to help you with your health related needs. Please feel free to call me @ [phone].     Mickel Fuchs, BSW, [place] Managed Medicaid Team  [phone]

## 2020-11-15 NOTE — Patient Outreach (Signed)
Care Coordination  11/15/2020  Misty [name] 11-30-1980 [id number]   Medicaid Managed Care   Unsuccessful Outreach Note  11/15/2020 Name: Misty [name] MRN: [id number] DOB: 1980-10-03  Referred by: [facility] [place] Reason for referral : High Risk Managed Medicaid (Unsuccessful telephone outreach)   A second unsuccessful telephone outreach was attempted today. The patient was referred to the case management team for assistance with care management and care coordination.   Follow Up Plan: The care management team will reach out to the patient again over the next 7 days.   [name] RN, BSN [place]  [facility] - Managed Medicaid High Risk [phone].

## 2020-11-15 NOTE — Patient Instructions (Signed)
Hi [name], sorry I missed you again today, I hope you are doing well - as a part of your Medicaid benefit, you are eligible for care management and care coordination services at no cost or copay. I was unable to reach you by phone today but would be happy to help you with your health related needs. Please feel free to call me at [phone].  A member of the Managed Medicaid care management team will reach out to you again over the next 7 days.   [name] RN, BSN   [facility] - Managed Medicaid High Risk [phone].

## 2020-11-15 NOTE — Patient Outreach (Signed)
Care Coordination  11/15/2020  Misty [name] 01-Mar-1980 [id number]   Medicaid Managed Care   Unsuccessful Outreach Note  11/15/2020 Name: Misty [name] MRN: [id number] DOB: 10-30-1980  Referred by: [facility] [place] Reason for referral : High Risk Managed Medicaid (MM Social Work Unsuccessful [employer])   An unsuccessful telephone outreach was attempted today. The patient was referred to the case management team for assistance with care management and care coordination.   Follow Up Plan: The patient has been provided with contact information for the care management team and has been advised to call with any health related questions or concerns.  Mickel Fuchs, BSW, [place] Managed Medicaid Team  [phone]

## 2020-11-30 ENCOUNTER — Other Ambulatory Visit: Payer: Self-pay | Admitting: Licensed Clinical Social Worker
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
## 2020-11-30 NOTE — Patient Instructions (Addendum)
Visit Information  Misty [name] was given information about Medicaid Managed Care team care coordination services as a part of their [facility] Medicaid benefit. Misty [name] verbally consented to engagement with the [facility] Managed Care team.   If you are experiencing a medical emergency, please call 911 or report to your local emergency department or urgent care.   If you have a non-emergency medical problem during routine business hours, please contact your provider's office and ask to speak with a nurse.   For questions related to your [facility] health plan, please call: [phone] or go here:[email or web address]  If you would like to schedule transportation through your [facility] plan, please call the following number at least 2 days in advance of your appointment: [phone].  Call the [place] at [phone], at any time, 24 hours a day, 7 days a week. If you are in danger or need immediate medical attention call 911.  If you would like help to quit smoking, call 1-800-QUIT-NOW (330) 376-2780) OR Espaol: 1-855-Djelo-Ya (2-671-245-8099) o para ms informacin haga clic aqu or Text READY to 200-400 to register via text  Timeframe:  Long-Range Goal Priority:  High Start Date:  10/25/20                           Expected End Date: ongoing                      Follow Up Date 01/11/20    Interventions:  Patient interviewed and appropriate assessments performed Collaborated with clinical team regarding patient needs  SDOH (Social Determinants of Health) assessments performed: Yes Current barriers:   Chronic Mental Health needs related to anxiety Mental Health Concerns  Needs Support, Education, and Care Coordination in order to meet unmet mental health needs. Clinical Goal(s): demonstrate a reduction in symptoms related to :Anxiety with Excessive Worry, Panic Symptoms, and Depression: depressed mood anxiety      10 LITTLE Things To Do When You're Feeling Too Down To Do Anything  Take a shower. Even if you plan to stay in all day long and not see a soul, take a shower. It takes the most effort to hop in to the shower but once you do, you'll feel immediate results. It will wake you up and you'll be feeling much fresher (and cleaner too).  Brush and floss your teeth. Give your teeth a good brushing with a floss finish. It's a small task but it feels so good and you can check 'taking care of your health' off the list of things to do.  Do something small on your list. Most of [place] have some small thing we would like to get done (load of laundry, sew a button, email a friend). Doing one of these things will make you feel like you've accomplished something.  Drink water. Drinking water is easy right? It's also really beneficial for your health so keep a glass beside you all day and take sips often. It gives you energy and prevents you from boredom eating.  Do some floor exercises. The last thing you want to do is exercise but it might be just the thing you need the most. Keep it simple and do exercises that involve sitting or laying on the floor. Even the smallest of exercises release chemicals in the brain that make you feel good. Yoga stretches or core exercises are going to make you feel good with minimal effort.  Make  your bed. Making your bed takes a few minutes but it's productive and you'll feel relieved when it's done. An unmade bed is a huge visual reminder that you're having an unproductive day. Do it and consider it your housework for the day.  Put on some nice clothes. Take the sweatpants off even if you don't plan to go anywhere. Put on clothes that make you feel good. Take a look in the mirror so your brain recognizes the sweatpants have been replaced with clothes that make you look great. It's an instant confidence booster.  Wash the dishes. A pile of dirty dishes in the sink is a  reflection of your mood. It's possible that if you wash up the dishes, your mood will follow suit. It's worth a try.  Cook a real meal. If you have the luxury to have a "do nothing" day, you have time to make a real meal for yourself. Make a meal that you love to eat. The process is good to get you out of the funk and the food will ensure you have more energy for tomorrow.  Write out your thoughts by hand. When you hand write, you stimulate your brain to focus on the moment that you're in so make yourself comfortable and write whatever comes into your mind. Put those thoughts out on paper so they stop spinning around in your head. Those thoughts might be the very thing holding you down. Patient Goals/Self-Care Activities: Over the next 120 days connect with provider for ongoing mental health treatment.   Increase coping skills, healthy habits, self-management skills, and stress reduction  Misty [name], BSW, MSW, [employer] Managed Medicaid LCSW [email or web address] Phone: [phone]

## 2020-12-02 ENCOUNTER — Encounter: Payer: Medicaid Other | Admitting: Obstetrics and Gynecology

## 2020-12-02 DIAGNOSIS — Z419 Encounter for procedure for purposes other than remedying health state, unspecified: Secondary | ICD-10-CM | POA: Diagnosis not present

## 2020-12-08 ENCOUNTER — Other Ambulatory Visit: Payer: Self-pay

## 2020-12-08 ENCOUNTER — Other Ambulatory Visit: Payer: Self-pay | Admitting: Obstetrics and Gynecology

## 2020-12-08 DIAGNOSIS — G4700 Insomnia, unspecified: Secondary | ICD-10-CM

## 2020-12-08 NOTE — Patient Outreach (Signed)
Medicaid Managed Care   Nurse Care Manager Note  12/08/2020 Name:  Misty [name] MRN:  [id number] DOB:  19-Sep-1980  Misty [name] is an 40 y.o. year old female who is a primary patient of [facility] [place] Coordination team was consulted for assistance with:    Chronic healthcare management needs.  Misty [name] was given information about Medicaid Managed Care Coordination team services today. [name] agreed to services and verbal consent obtained.  Engaged with patient by telephone for follow up visit in response to provider referral for case management and/or care coordination services.   Assessments/Interventions:  Review of past medical history, allergies, medications, health status, including review of consultants reports, laboratory and other test data, was performed as part of comprehensive evaluation and provision of chronic care management services.  SDOH (Social Determinants of Health) assessments and interventions performed: SDOH Interventions    Flowsheet Row Most Recent Value  SDOH Interventions   Housing Interventions Intervention Not Indicated  Transportation Interventions Intervention Not Indicated       Care Plan  No Known Allergies  Medications Reviewed Today     Reviewed by [name], RN (Registered Nurse) on 12/08/20 at 35  Med List Status: <None>   Medication Order Taking? Sig Documenting Provider Last Dose Status Informant  Accu-Chek FastClix Lancets MISC [id number] Yes Use twice per day to check your blood glucose.  E11.65 Jose Persia, MD Taking Active Self  diclofenac sodium (VOLTAREN) 1 % GEL [id number]  Apply 2 g topically 4 (four) times daily.  Patient not taking: No sig reported   Caccavale, Sophia, PA-C  Active   DULoxetine (CYMBALTA) 30 MG capsule [id number]  Take 1 capsule (30 mg total) by mouth at bedtime. Take 1 tab nightly x 1 week then 2 tabs  nightly- for nerve pain Lovorn, Jinny Blossom, MD  Expired 11/25/19 2359   gabapentin (NEURONTIN) 300 MG capsule [id number]  Take 300 mg by mouth at bedtime.  Patient not taking: Reported on 08/18/2020   [provider]  Active            Med Note ([place]   Sun Oct 06, 2018  8:29 AM)    glucose blood (ACCU-CHEK GUIDE) test strip [id number] Yes Use one strip, twice daily to check your blood glucose.  E11.65 Jose Persia, MD Taking Active Self  LANTUS SOLOSTAR 100 UNIT/ML Solostar Pen [id number] Yes SMARTSIG:10 Unit(s) SUB-Q Morning-Night [provider] Taking Active   lisinopril (ZESTRIL) 10 MG tablet [id number]  Take 10 mg by mouth daily.  Patient not taking: Reported on 07/08/2020   [provider]  Active   methocarbamol (ROBAXIN) 500 MG tablet [id number]  Take 500 mg by mouth 4 (four) times daily.  Patient not taking: Reported on 10/14/2020   [provider]  Active   oxyCODONE-acetaminophen (PERCOCET/ROXICET) 5-325 MG tablet [id number] Yes Take 1 tablet by mouth every 8 (eight) hours as needed for severe pain. Takes 4 times a day [provider] Taking Active Self  pioglitazone (ACTOS) 30 MG tablet [id number]  Take 1 tablet (30 mg total) by mouth daily.  Patient not taking: Reported on 10/14/2020   Jose Persia, MD  Active   prazosin (MINIPRESS) 2 MG capsule [id number]  Take 2 mg by mouth at bedtime.  Patient not taking: Reported on 07/08/2020   [provider]  Active   predniSONE (DELTASONE) 5 MG tablet [id number]  Take by mouth daily with breakfast.  Patient not taking: Reported on 07/08/2020   [provider]  Active   pregabalin (LYRICA) 75 MG capsule [id number] Yes Take 75 mg by mouth 2 (two) times daily. [provider] Taking Active Self  sertraline (ZOLOFT) 50 MG tablet [id number]  Take 50 mg by mouth daily.  Patient not taking: No sig reported   [provider]  Active   sitaGLIPtin (JANUVIA) 100 MG  tablet [id number]  Take 1 tablet (100 mg total) by mouth daily.  Patient not taking: No sig reported   [name], MD  Active   traZODone (DESYREL) 50 MG tablet [id number]  Take 1 tablet (50 mg total) by mouth at bedtime.  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[place]) 08/17/2017   Morbid obesity ([place]) 08/17/2017   Hypokalemia 08/17/2017   Headache 02/28/2017   Heart palpitations 06/04/2012   Tobacco use 06/03/2012   Preventative health care 05/03/2011   Diabetes type 2, uncontrolled 02/24/2008   Polycystic ovarian disease 07/27/2006   GERD 03/02/2006    Conditions to be addressed/monitored per PCP order:   chronic healthcare management needs,   Care Plan : General Plan of Care (Adult)  Updates made by [name], RN since 12/08/2020 12:00 AM     Problem: Health Promotion or Disease Self-Management (General Plan of Care)   Priority: High  Onset Date: 01/19/2020     Long-Range Goal: Self-Management Plan Developed   Start Date: 01/19/2020  Expected End Date: 01/14/2021  Recent Progress: Not on track  Priority: High  Note:   Current Barriers:  Knowledge Deficits related to medications. Chronic Disease Management support and education needs. Patient with DM2, tobacco use, anxiety, chronic pain, headaches. Patient given 1-800-quit now phone number.  Patient needs Podiatrist  and eye provider.  Nurse Case Manager Clinical Goal(s):  Over the next 90 days, patient will  attend all scheduled medical appointments: Over the next 90 days, patient will work with CM team pharmacist to review medications. Over the next 90 days, patient will work with CM clinical social worker to address anxiety.  Interventions:  Inter-disciplinary care team collaboration (see longitudinal plan of care) Evaluation of current treatment plan and patient's adherence to plan as established by provider. Reviewed medications with patient. Collaborated with pharmacy and social work. Discussed plans with patient for ongoing care management follow up and provided patient with direct contact information for care management team Reviewed scheduled/upcoming provider appointments.  Has appt with [facility] 12/13/20.  Patient states she has a PT appointment in January. Collaborated with Care Guide for Podiatry provider  and eye provider. Care Guide referral for Podiatrist and eye provider Social Work referral for anxiety, resources for light bill Update 06/23/20:  patient has met with Pharmacist and Social Worker-continues to follow.   Patient Goals/Self-Care Activities Over the next 30 days, patient will:  -Attends all scheduled provider appointments Calls pharmacy for medication refills Calls provider office for new concerns or questions  Follow Up Plan: The Managed Medicaid care management team will reach out to the patient again over the next 30 days.  The patient has been provided with contact information for the Managed Medicaid care management team and has been advised to call with any health related questions or concerns.     Follow Up:  Patient agrees to Care Plan and Follow-up.  Plan: The Managed Medicaid care management team will reach out to the patient again over the next 30 days. and The  Patient has been provided with contact information for the Managed Medicaid care management team and has been advised to call with any health related questions or concerns.  Date/time of  next scheduled RN care management/care coordination outreach:  01/05/21 at 330

## 2020-12-08 NOTE — Patient Instructions (Signed)
Hi [name], thank you for speaking with me, I hope you have a nice afternoon.  [name] was given information about Medicaid Managed Care team care coordination services as a part of their [facility] Medicaid benefit. Misty [name] verbally consented to engagement with the [facility] Managed Care team.   If you are experiencing a medical emergency, please call 911 or report to your local emergency department or urgent care.   If you have a non-emergency medical problem during routine business hours, please contact your provider's office and ask to speak with a nurse.   For questions related to your [facility] health plan, please call: [phone] or go here:[email or web address]  If you would like to schedule transportation through your [facility] plan, please call the following number at least 2 days in advance of your appointment: [phone].  Call the [place] at [phone], at any time, 24 hours a day, 7 days a week. If you are in danger or need immediate medical attention call 911.  If you would like help to quit smoking, call 1-800-QUIT-NOW 203-010-9261) OR Espaol: 1-855-Djelo-Ya (8-937-342-8768) o para ms informacin haga clic aqu or Text READY to 200-400 to register via text  Misty [name] - following are the goals we discussed in your visit today:   Goals Addressed             This Visit's Progress    Protect My Health       Timeframe:  Long-Range Goal Priority:  High Start Date:      06/23/20                       Expected End Date:      ongoing                 Follow Up Date:  01/08/21   - schedule appointment for flu shot - schedule appointment for vaccines needed due to my age or health - schedule recommended health tests (blood work, mammogram, colonoscopy, pap test) - schedule and keep appointment for annual check-up    Why is this important?   Screening tests can find diseases  early when they are easier to treat.  Your doctor or nurse will talk with you about which tests are important for you.  Getting shots for common diseases like the flu and shingles will help prevent them.   Update 12/08/20:  patient states she has a PCP appt. 12/13/20 at [facility] a Podiatrist and eye provider.   The patient verbalized understanding of instructions provided today and declined a print copy of patient instruction materials.   The Managed Medicaid care management team will reach out to the patient again over the next 30 days.  The  Patient  has been provided with contact information for the Managed Medicaid care management team and has been advised to call with any health related questions or concerns.   Misty Raider RN, BSN [place] Management Coordinator - Managed Medicaid High Risk [phone]   Following is a copy of your plan of care:  Care Plan : General Plan of Care (Adult)  Updates made by [name], RN since 12/08/2020 12:00 AM     Problem: Health Promotion or Disease Self-Management (General Plan of Care)   Priority: High  Onset Date: 01/19/2020     Long-Range Goal: Self-Management Plan Developed   Start Date: 01/19/2020  Expected End Date: 01/14/2021  Recent Progress: Not  on track  Priority: High  Note:   Current Barriers:  Knowledge Deficits related to medications. Chronic Disease Management support and education needs. Patient with DM2, tobacco use, anxiety, chronic pain, headaches. Patient given 1-800-quit now phone number.  Patient needs Podiatrist  and eye provider.  Nurse Case Manager Clinical Goal(s):  Over the next 90 days, patient will attend all scheduled medical appointments: Over the next 90 days, patient will work with CM team pharmacist to review medications. Over the next 90 days, patient will work with CM clinical social worker to address anxiety.  Interventions:  Inter-disciplinary care  team collaboration (see longitudinal plan of care) Evaluation of current treatment plan and patient's adherence to plan as established by provider. Reviewed medications with patient. Collaborated with pharmacy and social work. Discussed plans with patient for ongoing care management follow up and provided patient with direct contact information for care management team Reviewed scheduled/upcoming provider appointments.  Has appt with [facility] 12/13/20.  Patient states she has a PT appointment in January. Collaborated with Care Guide for Podiatry provider  and eye provider. Care Guide referral for Podiatrist and eye provider Social Work referral for anxiety, resources for light bill Update 06/23/20:  patient has met with Pharmacist and Social Worker-continues to follow.   Patient Goals/Self-Care Activities Over the next 30 days, patient will:  -Attends all scheduled provider appointments Calls pharmacy for medication refills Calls provider office for new concerns or questions  Follow Up Plan: The Managed Medicaid care management team will reach out to the patient again over the next 30 days.  The patient has been provided with contact information for the Managed Medicaid care management team and has been advised to call with any health related questions or concerns.

## 2020-12-13 DIAGNOSIS — R209 Unspecified disturbances of skin sensation: Secondary | ICD-10-CM | POA: Diagnosis not present

## 2020-12-13 DIAGNOSIS — M47812 Spondylosis without myelopathy or radiculopathy, cervical region: Secondary | ICD-10-CM | POA: Diagnosis not present

## 2020-12-13 DIAGNOSIS — Z79899 Other long term (current) drug therapy: Secondary | ICD-10-CM | POA: Diagnosis not present

## 2020-12-13 DIAGNOSIS — M170 Bilateral primary osteoarthritis of knee: Secondary | ICD-10-CM | POA: Diagnosis not present

## 2020-12-13 DIAGNOSIS — F17210 Nicotine dependence, cigarettes, uncomplicated: Secondary | ICD-10-CM | POA: Diagnosis not present

## 2020-12-13 DIAGNOSIS — Z6835 Body mass index (BMI) 35.0-35.9, adult: Secondary | ICD-10-CM | POA: Diagnosis not present

## 2020-12-13 DIAGNOSIS — R030 Elevated blood-pressure reading, without diagnosis of hypertension: Secondary | ICD-10-CM | POA: Diagnosis not present

## 2020-12-13 DIAGNOSIS — M160 Bilateral primary osteoarthritis of hip: Secondary | ICD-10-CM | POA: Diagnosis not present

## 2020-12-14 ENCOUNTER — Other Ambulatory Visit: Payer: Self-pay

## 2020-12-14 NOTE — Patient Instructions (Signed)
Visit Information  Misty [name] was given information about Medicaid Managed Care team care coordination services as a part of their [facility] Medicaid benefit. Misty [name] verbally consented to engagement with the [facility] Managed Care team.   If you are experiencing a medical emergency, please call 911 or report to your local emergency department or urgent care.   If you have a non-emergency medical problem during routine business hours, please contact your provider's office and ask to speak with a nurse.   For questions related to your [facility] health plan, please call: [phone] or go here:[email or web address]  If you would like to schedule transportation through your [facility] plan, please call the following number at least 2 days in advance of your appointment: [phone].  Call the [place] at [phone], at any time, 24 hours a day, 7 days a week. If you are in danger or need immediate medical attention call 911.  If you would like help to quit smoking, call 1-800-QUIT-NOW 254-559-0116) OR Espaol: 1-855-Djelo-Ya (4-383-779-3968) o para ms informacin haga clic aqu or Text READY to 200-400 to register via text  Misty [name] - following are the goals we discussed in your visit today:   Goals Addressed   None     Social Worker will follow up with patient in 30 days.   Mickel Fuchs, BSW, [place] Managed Medicaid Team  [phone]   Following is a copy of your plan of care:  There are no care plans that you recently modified to display for this patient.

## 2020-12-14 NOTE — Patient Outreach (Signed)
Medicaid Managed Care Social Work Note  12/14/2020 Name:  Misty [name] MRN:  [id number] DOB:  1980-05-17  Misty [name] is an 40 y.o. year old female who is a primary patient of [facility] [place] Coordination team was consulted for assistance with:   dental and podiatry  [name] was given information about Medicaid Managed Care Coordination team services today. [facility] Patient agreed to services and verbal consent obtained.  Engaged with patient  for by telephone forfollow up visit in response to referral for case management and/or care coordination services.   Assessments/Interventions:  Review of past medical history, allergies, medications, health status, including review of consultants reports, laboratory and other test data, was performed as part of comprehensive evaluation and provision of chronic care management services.  SDOH: (Social Determinant of Health) assessments and interventions performed: BSW contacted patient regarding resources for dental and podiatry. Patient stated she was at [facility] [employer] and they have a different process. BSW completed research on [employer] and informed patient she would email the resources to [email or web address].   Advanced Directives Status:  Not addressed in this encounter.  Care Plan                 No Known Allergies  Medications Reviewed Today     Reviewed by [name], RN (Registered Nurse) on 12/08/20 at 26  Med List Status: <None>   Medication Order Taking? Sig Documenting Provider Last Dose Status Informant  Accu-Chek FastClix Lancets MISC [id number] Yes Use twice per day to check your blood glucose.  E11.65 Jose Persia, MD Taking Active Self  diclofenac sodium (VOLTAREN) 1 % GEL [id number]  Apply 2 g topically 4 (four) times daily.  Patient not taking: No sig reported   Caccavale, Sophia, PA-C  Active    DULoxetine (CYMBALTA) 30 MG capsule [id number]  Take 1 capsule (30 mg total) by mouth at bedtime. Take 1 tab nightly x 1 week then 2 tabs nightly- for nerve pain Lovorn, Jinny Blossom, MD  Expired 11/25/19 2359   gabapentin (NEURONTIN) 300 MG capsule [id number]  Take 300 mg by mouth at bedtime.  Patient not taking: Reported on 08/18/2020   [provider]  Active            Med Note ([place]   Sun Oct 06, 2018  8:29 AM)    glucose blood (ACCU-CHEK GUIDE) test strip [id number] Yes Use one strip, twice daily to check your blood glucose.  E11.65 Jose Persia, MD Taking Active Self  LANTUS SOLOSTAR 100 UNIT/ML Solostar Pen [id number] Yes SMARTSIG:10 Unit(s) SUB-Q Morning-Night [provider] Taking Active   lisinopril (ZESTRIL) 10 MG tablet [id number]  Take 10 mg by mouth daily.  Patient not taking: Reported on 07/08/2020   [provider]  Active   methocarbamol (ROBAXIN) 500 MG tablet [id number]  Take 500 mg by mouth 4 (four) times daily.  Patient not taking: Reported on 10/14/2020   [provider]  Active   oxyCODONE-acetaminophen (PERCOCET/ROXICET) 5-325 MG tablet [id number] Yes Take 1 tablet by mouth every 8 (eight) hours as needed for severe pain. Takes 4 times a day [provider] Taking Active Self  pioglitazone (ACTOS) 30 MG tablet [id number]  Take 1 tablet (30 mg total) by mouth daily.  Patient not taking: Reported on 10/14/2020   Jose Persia, MD  Active   prazosin (MINIPRESS) 2 MG capsule [id number]  Take 2 mg by mouth at  bedtime.  Patient not taking: Reported on 07/08/2020   [provider]  Active   predniSONE (DELTASONE) 5 MG tablet [id number]  Take by mouth daily with breakfast.  Patient not taking: Reported on 07/08/2020   [provider]  Active   pregabalin (LYRICA) 75 MG capsule [id number] Yes Take 75 mg by mouth 2 (two) times daily. [provider] Taking Active Self  sertraline (ZOLOFT) 50 MG tablet [id number]   Take 50 mg by mouth daily.  Patient not taking: No sig reported   [provider]  Active   sitaGLIPtin (JANUVIA) 100 MG tablet [id number]  Take 1 tablet (100 mg total) by mouth daily.  Patient not taking: No sig reported   [name], MD  Active   traZODone (DESYREL) 50 MG tablet [id number]  Take 1 tablet (50 mg total) by mouth at bedtime.  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[place]) 08/17/2017   Morbid obesity ([place]) 08/17/2017   Hypokalemia 08/17/2017   Headache 02/28/2017   Heart palpitations 06/04/2012   Tobacco use 06/03/2012   Preventative health care 05/03/2011   Diabetes type 2, uncontrolled 02/24/2008   Polycystic ovarian disease 07/27/2006   GERD 03/02/2006    Conditions to be addressed/monitored per PCP order:   resources  There are no care plans that you recently modified to display for this patient.   Follow up:  Patient agrees to Care Plan and Follow-up.  Plan: The Managed Medicaid care management team will reach out to the patient again over the next 30 days.  Date/time of next scheduled Social Work care management/care coordination outreach:  01/15/20  Mickel Fuchs, [place] Miss, [place] Medicaid Team  [phone]

## 2020-12-16 DIAGNOSIS — Z79899 Other long term (current) drug therapy: Secondary | ICD-10-CM | POA: Diagnosis not present

## 2021-01-02 DIAGNOSIS — Z419 Encounter for procedure for purposes other than remedying health state, unspecified: Secondary | ICD-10-CM | POA: Diagnosis not present

## 2021-01-05 ENCOUNTER — Other Ambulatory Visit: Payer: Self-pay | Admitting: Obstetrics and Gynecology

## 2021-01-05 ENCOUNTER — Other Ambulatory Visit: Payer: Self-pay

## 2021-01-05 NOTE — Patient Outreach (Signed)
Care Coordination  01/05/2021  CRISTY COLMENARES January 10, 1980 [id number]  RNCM called patient at scheduled  time.  Patient stated I woke her up, recovering from the flu.  Patient asked to be called another day/time.  RNCM rescheduled patient's appointment at her request.  [name] RN, [place] Management Coordinator - Managed [place] High Risk [phone].

## 2021-01-10 ENCOUNTER — Other Ambulatory Visit: Payer: Self-pay

## 2021-01-10 NOTE — Patient Outreach (Signed)
Care Coordination  01/10/2021  Misty [name] Jun 10, 1980 [id number]   Medicaid Managed Care   Unsuccessful Outreach Note  01/10/2021 Name: Misty [name] MRN: [id number] DOB: 07-10-1980  Referred by: [facility] [place] Reason for referral : High Risk Managed Medicaid (MM social work telephone outreach)   An unsuccessful telephone outreach was attempted today. The patient was referred to the case management team for assistance with care management and care coordination.   Follow Up Plan: The care management team will reach out to the patient again over the next 30-45 days.   Marland Kitchenajs

## 2021-01-10 NOTE — Patient Instructions (Signed)
Visit Information  Ms. SHAYANA HORNSTEIN  - as a part of your Medicaid benefit, you are eligible for care management and care coordination services at no cost or copay. I was unable to reach you by phone today but would be happy to help you with your health related needs. Please feel free to call me @ [phone].     Mickel Fuchs, BSW, [place] Managed Medicaid Team  [phone]

## 2021-01-11 ENCOUNTER — Other Ambulatory Visit: Payer: Self-pay | Admitting: Obstetrics and Gynecology

## 2021-01-11 ENCOUNTER — Ambulatory Visit (HOSPITAL_COMMUNITY): Payer: Medicaid Other | Admitting: Clinical

## 2021-01-11 ENCOUNTER — Other Ambulatory Visit: Payer: Self-pay

## 2021-01-11 NOTE — Patient Outreach (Signed)
Care Coordination  01/11/2021  Misty [name] [birth year] [id number]  RNCM called patient at scheduled time.  Patient answered phone and asked to be called back in a couple of weeks.  RNCM rescheduled appointment at patient's request.  [name] RN, BSN [place] Management Coordinator - Managed [place] High Risk [phone].

## 2021-01-12 DIAGNOSIS — Z79899 Other long term (current) drug therapy: Secondary | ICD-10-CM | POA: Diagnosis not present

## 2021-01-12 DIAGNOSIS — F17210 Nicotine dependence, cigarettes, uncomplicated: Secondary | ICD-10-CM | POA: Diagnosis not present

## 2021-01-12 DIAGNOSIS — M47812 Spondylosis without myelopathy or radiculopathy, cervical region: Secondary | ICD-10-CM | POA: Diagnosis not present

## 2021-01-12 DIAGNOSIS — M170 Bilateral primary osteoarthritis of knee: Secondary | ICD-10-CM | POA: Diagnosis not present

## 2021-01-12 DIAGNOSIS — Z0000 Encounter for general adult medical examination without abnormal findings: Secondary | ICD-10-CM | POA: Diagnosis not present

## 2021-01-12 DIAGNOSIS — Z6835 Body mass index (BMI) 35.0-35.9, adult: Secondary | ICD-10-CM | POA: Diagnosis not present

## 2021-01-12 DIAGNOSIS — M545 Low back pain, unspecified: Secondary | ICD-10-CM | POA: Diagnosis not present

## 2021-01-12 DIAGNOSIS — R209 Unspecified disturbances of skin sensation: Secondary | ICD-10-CM | POA: Diagnosis not present

## 2021-01-12 DIAGNOSIS — M549 Dorsalgia, unspecified: Secondary | ICD-10-CM | POA: Diagnosis not present

## 2021-01-12 DIAGNOSIS — R030 Elevated blood-pressure reading, without diagnosis of hypertension: Secondary | ICD-10-CM | POA: Diagnosis not present

## 2021-01-12 DIAGNOSIS — M160 Bilateral primary osteoarthritis of hip: Secondary | ICD-10-CM | POA: Diagnosis not present

## 2021-01-13 ENCOUNTER — Ambulatory Visit (HOSPITAL_COMMUNITY): Payer: Self-pay | Admitting: Student in an Organized Health Care Education/Training Program

## 2021-01-14 ENCOUNTER — Other Ambulatory Visit: Payer: Self-pay

## 2021-01-14 NOTE — Patient Instructions (Signed)
Visit Information  Ms. FAYETTE GASNER  - as a part of your Medicaid benefit, you are eligible for care management and care coordination services at no cost or copay. I was unable to reach you by phone today but would be happy to help you with your health related needs. Please feel free to call me @ [phone].   A member of the Managed Medicaid care management team will reach out to you again over the next 7 days.   Mickel Fuchs, BSW, [place] Managed Medicaid Team  [phone]

## 2021-01-14 NOTE — Patient Outreach (Signed)
Care Coordination  01/14/2021  Lawren Sexson Pierce-Younger [birth year] [id number]   Medicaid Managed Care   Unsuccessful Outreach Note  01/14/2021 Name: Misty [name] MRN: [id number] DOB: 02-03-1980  Referred by: [facility] [place] Reason for referral : High Risk Managed Medicaid (MM Social Work [employer])   An unsuccessful telephone outreach was attempted today. The patient was referred to the case management team for assistance with care management and care coordination.   Follow Up Plan: The patient has been provided with contact information for the care management team and has been advised to call with any health related questions or concerns.   Mickel Fuchs, BSW, [place] Managed Medicaid Team  [phone]

## 2021-01-17 DIAGNOSIS — F17210 Nicotine dependence, cigarettes, uncomplicated: Secondary | ICD-10-CM | POA: Diagnosis not present

## 2021-01-17 DIAGNOSIS — Z6835 Body mass index (BMI) 35.0-35.9, adult: Secondary | ICD-10-CM | POA: Diagnosis not present

## 2021-01-17 DIAGNOSIS — R5383 Other fatigue: Secondary | ICD-10-CM | POA: Diagnosis not present

## 2021-01-17 DIAGNOSIS — R300 Dysuria: Secondary | ICD-10-CM | POA: Diagnosis not present

## 2021-01-17 DIAGNOSIS — Z3200 Encounter for pregnancy test, result unknown: Secondary | ICD-10-CM | POA: Diagnosis not present

## 2021-01-17 DIAGNOSIS — D539 Nutritional anemia, unspecified: Secondary | ICD-10-CM | POA: Diagnosis not present

## 2021-01-17 DIAGNOSIS — E7800 Pure hypercholesterolemia, unspecified: Secondary | ICD-10-CM | POA: Diagnosis not present

## 2021-01-17 DIAGNOSIS — E559 Vitamin D deficiency, unspecified: Secondary | ICD-10-CM | POA: Diagnosis not present

## 2021-01-17 DIAGNOSIS — Z79899 Other long term (current) drug therapy: Secondary | ICD-10-CM | POA: Diagnosis not present

## 2021-01-17 DIAGNOSIS — E612 Magnesium deficiency: Secondary | ICD-10-CM | POA: Diagnosis not present

## 2021-01-17 DIAGNOSIS — I10 Essential (primary) hypertension: Secondary | ICD-10-CM | POA: Diagnosis not present

## 2021-01-17 DIAGNOSIS — E1165 Type 2 diabetes mellitus with hyperglycemia: Secondary | ICD-10-CM | POA: Diagnosis not present

## 2021-01-17 DIAGNOSIS — R079 Chest pain, unspecified: Secondary | ICD-10-CM | POA: Diagnosis not present

## 2021-01-19 DIAGNOSIS — Z79899 Other long term (current) drug therapy: Secondary | ICD-10-CM | POA: Diagnosis not present

## 2021-01-20 ENCOUNTER — Ambulatory Visit (HOSPITAL_COMMUNITY): Payer: Medicaid Other | Admitting: Student in an Organized Health Care Education/Training Program

## 2021-01-20 DIAGNOSIS — I119 Hypertensive heart disease without heart failure: Secondary | ICD-10-CM | POA: Diagnosis not present

## 2021-01-20 DIAGNOSIS — E1165 Type 2 diabetes mellitus with hyperglycemia: Secondary | ICD-10-CM | POA: Diagnosis not present

## 2021-01-20 DIAGNOSIS — E782 Mixed hyperlipidemia: Secondary | ICD-10-CM | POA: Diagnosis not present

## 2021-01-20 DIAGNOSIS — R002 Palpitations: Secondary | ICD-10-CM | POA: Diagnosis not present

## 2021-02-02 DIAGNOSIS — Z419 Encounter for procedure for purposes other than remedying health state, unspecified: Secondary | ICD-10-CM | POA: Diagnosis not present

## 2021-02-08 ENCOUNTER — Other Ambulatory Visit: Payer: Self-pay

## 2021-02-09 DIAGNOSIS — M47812 Spondylosis without myelopathy or radiculopathy, cervical region: Secondary | ICD-10-CM | POA: Diagnosis not present

## 2021-02-09 DIAGNOSIS — R209 Unspecified disturbances of skin sensation: Secondary | ICD-10-CM | POA: Diagnosis not present

## 2021-02-09 DIAGNOSIS — M160 Bilateral primary osteoarthritis of hip: Secondary | ICD-10-CM | POA: Diagnosis not present

## 2021-02-09 DIAGNOSIS — Z6835 Body mass index (BMI) 35.0-35.9, adult: Secondary | ICD-10-CM | POA: Diagnosis not present

## 2021-02-09 DIAGNOSIS — M170 Bilateral primary osteoarthritis of knee: Secondary | ICD-10-CM | POA: Diagnosis not present

## 2021-02-09 DIAGNOSIS — Z79899 Other long term (current) drug therapy: Secondary | ICD-10-CM | POA: Diagnosis not present

## 2021-02-11 ENCOUNTER — Other Ambulatory Visit: Payer: Self-pay | Admitting: Obstetrics and Gynecology

## 2021-02-11 NOTE — Patient Outreach (Signed)
Care Coordination  02/11/2021  Misty [name] Feb 17, 1980 [id number]   Medicaid Managed Care   Unsuccessful Outreach Note  02/11/2021 Name: Misty [name] MRN: [id number] DOB: Dec 05, 1980  Referred by: [facility] [place] Reason for referral : High Risk Managed Medicaid (Unsuccessful telephone outreach)   An unsuccessful telephone outreach was attempted today. The patient was referred to the case management team for assistance with care management and care coordination.   Follow Up Plan: The care management team will reach out to the patient again over the next 7-14 days.   [name] RN, BSN [place]   [place] Curator - Managed Medicaid High Risk [phone]

## 2021-02-11 NOTE — Patient Instructions (Signed)
Hi Ms. Younger- I am sorry I missed you today, I hope you are doing well - as a part of your Medicaid benefit, you are eligible for care management and care coordination services at no cost or copay. I was unable to reach you by phone today but would be happy to help you with your health related needs. Please feel free to call me at [phone].  A member of the Managed Medicaid care management team will reach out to you again over the next 7 -14 days.   [name] RN, BSN [place]   [facility] Curator - Managed Medicaid High Risk [phone].

## 2021-02-17 ENCOUNTER — Other Ambulatory Visit: Payer: Self-pay

## 2021-02-17 ENCOUNTER — Other Ambulatory Visit: Payer: Self-pay | Admitting: Obstetrics and Gynecology

## 2021-02-17 NOTE — Patient Instructions (Signed)
Hi [name], thank you for speaking with me today-have a nice afternoon!  [name] was given information about Medicaid Managed Care team care coordination services as a part of their [facility] Medicaid benefit. Misty [name] verbally consented to engagement with the [facility] Managed Care team.   If you are experiencing a medical emergency, please call 911 or report to your local emergency department or urgent care.   If you have a non-emergency medical problem during routine business hours, please contact your provider's office and ask to speak with a nurse.   For questions related to your [facility] health plan, please call: [phone] or go here:[email or web address]  If you would like to schedule transportation through your [facility] plan, please call the following number at least 2 days in advance of your appointment: [phone].  Call the [place] at [phone], at any time, 24 hours a day, 7 days a week. If you are in danger or need immediate medical attention call 911.  If you would like help to quit smoking, call 1-800-QUIT-NOW (817)106-4695) OR Espaol: 1-855-Djelo-Ya (4-097-353-2992) o para ms informacin haga clic aqu or Text READY to 200-400 to register via text  Misty [name] - following are the goals we discussed in your visit today:   Goals Addressed             This Visit's Progress    Protect My Health       Timeframe:  Long-Range Goal Priority:  High Start Date:      06/23/20                       Expected End Date:      ongoing                 Follow Up Date:  03/18/21   - schedule appointment for flu shot - schedule appointment for vaccines needed due to my age or health - schedule recommended health tests (blood work, mammogram, colonoscopy, pap test) - schedule and keep appointment for annual check-up    Why is this important?   Screening tests can find diseases early  when they are easier to treat.  Your doctor or nurse will talk with you about which tests are important for you.  Getting shots for common diseases like the flu and shingles will help prevent them.   02/17/21:  Patient currently seeing cardiologist   Patient verbalizes understanding of instructions and care plan provided today and agrees to view in [place]. Active MyChart status confirmed with patient.    The Managed Medicaid care management team will reach out to the patient again over the next 30 days.   [name] RN, BSN [place]   [facility] Curator - Managed Medicaid High Risk [phone].   Following is a copy of your plan of care:  Care Plan : General Plan of Care (Adult)  Updates made by [name], RN since 02/17/2021 12:00 AM     Problem: Health Promotion or Disease Self-Management (General Plan of Care)   Priority: High  Onset Date: 01/19/2020     Long-Range Goal: Self-Management Plan Developed   Start Date: 01/19/2020  Expected End Date: 05/17/2021  Recent Progress: Not on track  Priority: High  Note:   Current Barriers:  Knowledge Deficits related to medications. Chronic Disease Management support and education needs. Patient with DM2, tobacco use, anxiety, chronic pain, headaches. Patient given 1-800-quit now phone number.  Patient needs Podiatrist  and eye provider. 02/17/21:  Patient's blood sugars 140-195, continues to smoke 1/2 ppd-currently seeing cardiology.  Patient states she did not receive eye or podiatry resources and needs utility resources  Nurse Case Manager Clinical Goal(s):  Over the next 90 days, patient will attend all scheduled medical appointments: Over the next 90 days, patient will work with CM team pharmacist to review medications. Over the next 90 days, patient will work with CM clinical social worker to address anxiety.  Interventions:  Inter-disciplinary care team collaboration (see longitudinal  plan of care) Evaluation of current treatment plan and patient's adherence to plan as established by provider. Reviewed medications with patient. Collaborated with pharmacy and social work. Discussed plans with patient for ongoing care management follow up and provided patient with direct contact information for care management team Reviewed scheduled/upcoming provider appointments Collaborated with BSW for Podiatry provider  and eye provider, utility resources BSW referral for Podiatrist and eye provider, utility resources Social Work referral for anxiety, resources for light bill Update 06/23/20:  patient has met with Pharmacist and Social Worker-continues to follow.   Patient Goals/Self-Care Activities Over the next 30 days, patient will:  -Attends all scheduled provider appointments Calls pharmacy for medication refills Calls provider office for new concerns or questions  Follow Up Plan: The Managed Medicaid care management team will reach out to the patient again over the next 30 days.  The patient has been provided with contact information for the Managed Medicaid care management team and has been advised to call with any health related questions or concerns.

## 2021-02-17 NOTE — Patient Outreach (Signed)
Medicaid Managed Care   Nurse Care Manager Note  02/17/2021 Name:  Misty [name] MRN:  [id number] DOB:  [birth year]  Misty [name] is an 41 y.o. year old female who is a primary patient of [facility] [place] Coordination team was consulted for assistance with:    Chronic healthcare management needs, DM, tobacco use, anxiety, chronic pain, headache  [name] was given information about Medicaid Managed Care Coordination team services today. [name] agreed to services and verbal consent obtained.  Engaged with patient by telephone for follow up visit in response to provider referral for case management and/or care coordination services.   Assessments/Interventions:  Review of past medical history, allergies, medications, health status, including review of consultants reports, laboratory and other test data, was performed as part of comprehensive evaluation and provision of chronic care management services.  SDOH (Social Determinants of Health) assessments and interventions performed: SDOH Interventions    Flowsheet Row Most Recent Value  SDOH Interventions   Food Insecurity Interventions Other (Comment)  [patient has resources that she utilizes as needed.]  Financial Strain Interventions Other (Comment)  [BSW referral]       Care Plan  No Known Allergies  Medications Reviewed Today     Reviewed by [name], RN (Registered Nurse) on 02/17/21 at 1253  Med List Status: <None>   Medication Order Taking? Sig Documenting Provider Last Dose Status Informant  Accu-Chek FastClix Lancets MISC [id number] No Use twice per day to check your blood glucose.  E11.65 Jose Persia, MD Taking Active Self  diclofenac sodium (VOLTAREN) 1 % GEL [id number] No Apply 2 g topically 4 (four) times daily.  Patient not taking: No sig reported   [place], Sophia, PA-C Not Taking Active   Dulaglutide  (TRULICITY) 2.19 XJ/8.8TG SOPN [id number] No Inject into the skin. [provider] Taking Active   DULoxetine (CYMBALTA) 30 MG capsule [id number] No Take 1 capsule (30 mg total) by mouth at bedtime. Take 1 tab nightly x 1 week then 2 tabs nightly- for nerve pain Lovorn, Megan, MD Taking Expired 02/08/21 2359   gabapentin (NEURONTIN) 300 MG capsule [id number] No Take 300 mg by mouth at bedtime.  Patient not taking: Reported on 02/08/2021   [provider] Not Taking Active            Med Note [name], [name]   Sun Oct 06, 2018  8:29 AM)    glucose blood (ACCU-CHEK GUIDE) test strip [id number] No Use one strip, twice daily to check your blood glucose.  E11.65 Jose Persia, MD Taking Active Self  LANTUS SOLOSTAR 100 UNIT/ML Solostar Pen [id number] No SMARTSIG:10 Unit(s) SUB-Q Morning-Night [provider] Taking Active   lisinopril (ZESTRIL) 10 MG tablet [id number] No Take 10 mg by mouth daily.  Patient not taking: Reported on 07/08/2020   [provider] Not Taking Active   meloxicam (MOBIC) 7.5 MG tablet [id number] No meloxicam 7.5 mg tablet  TAKE 1 TABLET BY MOUTH DAILY [provider] Taking Active   methocarbamol (ROBAXIN) 500 MG tablet [id number] No Take 500 mg by mouth 4 (four) times daily.  Patient not taking: Reported on 10/14/2020   [provider] Not Taking Active   oxyCODONE-acetaminophen (PERCOCET/ROXICET) 5-325 MG tablet [id number] No Take 1 tablet by mouth every 8 (eight) hours as needed for severe pain. Takes 4 times a day [provider] Taking Active Self  pioglitazone (ACTOS) 30 MG tablet [id number] No Take 1 tablet (  30 mg total) by mouth daily.  Patient not taking: Reported on 10/14/2020   Jose Persia, MD Not Taking Active   prazosin (MINIPRESS) 2 MG capsule [id number] No Take 2 mg by mouth at bedtime. [provider] Taking Active   predniSONE (DELTASONE) 5 MG tablet [id number] No Take by mouth daily with  breakfast.  Patient not taking: Reported on 07/08/2020   [provider] Not Taking Active   pregabalin (LYRICA) 75 MG capsule [id number] No Take 75 mg by mouth 2 (two) times daily. [provider] Taking Active Self  sertraline (ZOLOFT) 50 MG tablet [id number] No Take 50 mg by mouth daily. [provider] Taking Active   sitaGLIPtin (JANUVIA) 100 MG tablet [id number] No Take 1 tablet (100 mg total) by mouth daily.  Patient not taking: Reported on 01/19/2020   [name], MD Not Taking Active   traZODone (DESYREL) 50 MG tablet [id number] No Take 1 tablet (50 mg total) by mouth at bedtime. Jose Persia, MD Taking Active Self            Patient Active Problem List   Diagnosis Date Noted   Myofascial pain dysfunction syndrome 11/25/2018   Nerve pain 11/25/2018   Hypertension 09/19/2018   Insomnia 09/19/2018   Hematuria 09/19/2018   Left ankle pain 06/25/2018   Lumbar spine pain 06/25/2018   Thoracic spine pain 06/25/2018   Cervical spine pain 06/25/2018   Hidradenitis suppurativa 01/18/2018   History of diverticulitis 10/03/2017   Constipation 10/03/2017   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[place]) 08/17/2017   Morbid obesity ([place]) 08/17/2017   Hypokalemia 08/17/2017   Headache 02/28/2017   Heart palpitations 06/04/2012   Tobacco use 06/03/2012   Preventative health care 05/03/2011   Diabetes type 2, uncontrolled 02/24/2008   Polycystic ovarian disease 07/27/2006   GERD 03/02/2006    Conditions to be addressed/monitored per PCP order:  Chronic healthcare management needs, DM, tobacco use, anxiety, chronic pain, headache, GERD  Care Plan : General Plan of Care (Adult)  Updates made by [name], RN since 02/17/2021 12:00 AM     Problem: Health Promotion or Disease Self-Management (General Plan of Care)   Priority: High  Onset Date: 01/19/2020     Long-Range Goal: Self-Management Plan Developed   Start Date: 01/19/2020  Expected End Date: 05/17/2021   Recent Progress: Not on track  Priority: High  Note:   Current Barriers:  Knowledge Deficits related to medications. Chronic Disease Management support and education needs. Patient with DM2, tobacco use, anxiety, chronic pain, headaches. Patient given 1-800-quit now phone number.  Patient needs Podiatrist  and eye provider. 02/17/21:  Patient's blood sugars 140-195, continues to smoke 1/2 ppd-currently seeing cardiology.  Patient states she did not receive eye or podiatry resources and needs utility resources  Nurse Case Manager Clinical Goal(s):  Over the next 90 days, patient will attend all scheduled medical appointments: Over the next 90 days, patient will work with CM team pharmacist to review medications. Over the next 90 days, patient will work with CM clinical social worker to address anxiety.  Interventions:  Inter-disciplinary care team collaboration (see longitudinal plan of care) Evaluation of current treatment plan and patient's adherence to plan as established by provider. Reviewed medications with patient. Collaborated with pharmacy and social work. Discussed plans with patient for ongoing care management follow up and provided patient with direct contact information for care management team Reviewed scheduled/upcoming provider appointments Collaborated with BSW for Podiatry provider  and eye provider, utility resources BSW  referral for Podiatrist and eye provider, utility resources Social Work referral for anxiety, resources for light bill Update 06/23/20:  patient has met with Pharmacist and Social Worker-continues to follow.   Patient Goals/Self-Care Activities Over the next 30 days, patient will:  -Attends all scheduled provider appointments Calls pharmacy for medication refills Calls provider office for new concerns or questions  Follow Up Plan: The Managed Medicaid care management team will reach out to the patient again over the next 30 days.  The patient has  been provided with contact information for the Managed Medicaid care management team and has been advised to call with any health related questions or concerns.    Follow Up:  Patient agrees to Care Plan and Follow-up.  Plan: The Managed Medicaid care management team will reach out to the patient again over the next 30 days. and The  Patient has been provided with contact information for the Managed Medicaid care management team and has been advised to call with any health related questions or concerns.  Date/time of next scheduled RN care management/care coordination outreach: 03/17/21 at 1045

## 2021-02-21 DIAGNOSIS — I471 Supraventricular tachycardia: Secondary | ICD-10-CM | POA: Diagnosis not present

## 2021-02-21 DIAGNOSIS — R030 Elevated blood-pressure reading, without diagnosis of hypertension: Secondary | ICD-10-CM | POA: Diagnosis not present

## 2021-02-21 DIAGNOSIS — R002 Palpitations: Secondary | ICD-10-CM | POA: Diagnosis not present

## 2021-02-21 DIAGNOSIS — I10 Essential (primary) hypertension: Secondary | ICD-10-CM | POA: Diagnosis not present

## 2021-02-21 DIAGNOSIS — Z6834 Body mass index (BMI) 34.0-34.9, adult: Secondary | ICD-10-CM | POA: Diagnosis not present

## 2021-02-25 ENCOUNTER — Other Ambulatory Visit: Payer: Self-pay

## 2021-02-25 NOTE — Patient Outreach (Signed)
Medicaid Managed Care Social Work Note  02/25/2021 Name:  Misty [name] MRN:  [id number] DOB:  November 14, 1980  [place] Misty [name] is an 41 y.o. year old female who is a primary patient of [facility] [place] Coordination team was consulted for assistance with:   utility resources  [name] was given information about Medicaid Managed Care Coordination team services today. [name] agreed to services and verbal consent obtained.  Engaged with patient  for by telephone forfollow up visit in response to referral for case management and/or care coordination services.   Assessments/Interventions:  Review of past medical history, allergies, medications, health status, including review of consultants reports, laboratory and other test data, was performed as part of comprehensive evaluation and provision of chronic care management services.  SDOH: (Social Determinant of Health) assessments and interventions performed: BSW completed follow up with patient. She states she is doing well. Patient states she is no longer in need of any utility resources. Patient states she does not need any resources at this time.   Advanced Directives Status:  Not addressed in this encounter.  Care Plan                 No Known Allergies  Medications Reviewed Today     Reviewed by Misty Medicus, RN (Registered Nurse) on 02/17/21 at 1253  Med List Status: <None>   Medication Order Taking? Sig Documenting Provider Last Dose Status Informant  Accu-Chek FastClix Lancets MISC [id number] No Use twice per day to check your blood glucose.  E11.65 Misty Persia, MD Taking Active Self  diclofenac sodium (VOLTAREN) 1 % GEL [id number] No Apply 2 g topically 4 (four) times daily.  Patient not taking: No sig reported   [name], Sophia, PA-C Not Taking Active   Dulaglutide (TRULICITY) 5.72 IO/0.3TD SOPN [id number] No Inject into the skin.  [provider] Taking Active   DULoxetine (CYMBALTA) 30 MG capsule [id number] No Take 1 capsule (30 mg total) by mouth at bedtime. Take 1 tab nightly x 1 week then 2 tabs nightly- for nerve pain [name], Megan, MD Taking Expired 02/08/21 2359   gabapentin (NEURONTIN) 300 MG capsule [id number] No Take 300 mg by mouth at bedtime.  Patient not taking: Reported on 02/08/2021   [provider] Not Taking Active            Med Note Misty [name], Misty B   Sun Oct 06, 2018  8:29 AM)    glucose blood (ACCU-CHEK GUIDE) test strip [id number] No Use one strip, twice daily to check your blood glucose.  E11.65 Misty Persia, MD Taking Active Self  LANTUS SOLOSTAR 100 UNIT/ML Solostar Pen [id number] No SMARTSIG:10 Unit(s) SUB-Q Morning-Night [provider] Taking Active   lisinopril (ZESTRIL) 10 MG tablet [id number] No Take 10 mg by mouth daily.  Patient not taking: Reported on 07/08/2020   [provider] Not Taking Active   meloxicam (MOBIC) 7.5 MG tablet [id number] No meloxicam 7.5 mg tablet  TAKE 1 TABLET BY MOUTH DAILY [provider] Taking Active   methocarbamol (ROBAXIN) 500 MG tablet [id number] No Take 500 mg by mouth 4 (four) times daily.  Patient not taking: Reported on 10/14/2020   [provider] Not Taking Active   oxyCODONE-acetaminophen (PERCOCET/ROXICET) 5-325 MG tablet [id number] No Take 1 tablet by mouth every 8 (eight) hours as needed for severe pain. Takes 4 times a day [provider] Taking Active Self  pioglitazone (ACTOS) 30 MG tablet  [id number] No Take 1 tablet (30 mg total) by mouth daily.  Patient not taking: Reported on 10/14/2020   Misty Persia, MD Not Taking Active   prazosin (MINIPRESS) 2 MG capsule [id number] No Take 2 mg by mouth at bedtime. [provider] Taking Active   predniSONE (DELTASONE) 5 MG tablet [id number] No Take by mouth daily with breakfast.  Patient not taking: Reported on 07/08/2020   [provider] Not Taking Active   pregabalin (LYRICA) 75 MG capsule [id number] No Take 75 mg by mouth 2 (two) times daily. [provider] Taking Active Self  sertraline (ZOLOFT) 50 MG tablet [id number] No Take 50 mg by mouth daily. [provider] Taking Active   sitaGLIPtin (JANUVIA) 100 MG tablet [id number] No Take 1 tablet (100 mg total) by mouth daily.  Patient not taking: Reported on 01/19/2020   Misty Bruins, MD Not Taking Active   traZODone (DESYREL) 50 MG tablet [id number] No Take 1 tablet (50 mg total) by mouth at bedtime. Misty Persia, MD Taking Active Self            Patient Active Problem List   Diagnosis Date Noted   Myofascial pain dysfunction syndrome 11/25/2018   Nerve pain 11/25/2018   Hypertension 09/19/2018   Insomnia 09/19/2018   Hematuria 09/19/2018   Left ankle pain 06/25/2018   Lumbar spine pain 06/25/2018   Thoracic spine pain 06/25/2018   Cervical spine pain 06/25/2018   Hidradenitis suppurativa 01/18/2018   History of diverticulitis 10/03/2017   Constipation 10/03/2017   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[place]isty [name]) 08/17/2017   Morbid obesity ([place]) 08/17/2017   Hypokalemia 08/17/2017   Headache 02/28/2017   Heart palpitations 06/04/2012   Tobacco use 06/03/2012   Preventative health care 05/03/2011   Diabetes type 2, uncontrolled 02/24/2008   Polycystic ovarian disease 07/27/2006   GERD 03/02/2006    Conditions to be addressed/monitored per PCP order:   resources  There are no care plans that you recently modified to display for this patient.   Follow up:  Patient agrees to Care Plan and Follow-up.  Plan: The Managed Medicaid care management team will reach out to the patient again over the next 30-45 days.    Mickel Fuchs, BSW, [place] Managed Medicaid Team  [phone]

## 2021-02-25 NOTE — Patient Instructions (Signed)
Visit Information  [name] was given information about Medicaid Managed Care team care coordination services as a part of their [facility] Medicaid benefit. [name] verbally consented to engagement with the [facility] Managed Care team.   If you are experiencing a medical emergency, please call 911 or report to your local emergency department or urgent care.   If you have a non-emergency medical problem during routine business hours, please contact your provider's office and ask to speak with a nurse.   For questions related to your [facility] health plan, please call: [phone] or go here:[email or web address]  If you would like to schedule transportation through your [facility] plan, please call the following number at least 2 days in advance of your appointment: [phone].  You can also use the MTM portal or MTM mobile app to manage your rides. For the portal, please go to [email or web address].  Call the [place] at [phone], at any time, 24 hours a day, 7 days a week. If you are in danger or need immediate medical attention call 911.  If you would like help to quit smoking, call 1-800-QUIT-NOW 813-743-5891) OR Espaol: 1-855-Djelo-Ya (0-630-160-1093) o para ms informacin haga clic aqu or Text READY to 200-400 to register via text  [name] - following are the goals we discussed in your visit today:   Goals Addressed   None      The  Patient                                              has been provided with contact information for the Managed Medicaid care management team and has been advised to call with any health related questions or concerns.  Mickel Fuchs, BSW, [place] Managed Medicaid Team  [phone]   Following is a copy of your plan of care:  There are no care plans that you recently modified to display for this patient.

## 2021-02-28 DIAGNOSIS — R051 Acute cough: Secondary | ICD-10-CM | POA: Diagnosis not present

## 2021-03-02 DIAGNOSIS — Z419 Encounter for procedure for purposes other than remedying health state, unspecified: Secondary | ICD-10-CM | POA: Diagnosis not present

## 2021-03-10 DIAGNOSIS — R030 Elevated blood-pressure reading, without diagnosis of hypertension: Secondary | ICD-10-CM | POA: Diagnosis not present

## 2021-03-10 DIAGNOSIS — Z79899 Other long term (current) drug therapy: Secondary | ICD-10-CM | POA: Diagnosis not present

## 2021-03-10 DIAGNOSIS — Z6835 Body mass index (BMI) 35.0-35.9, adult: Secondary | ICD-10-CM | POA: Diagnosis not present

## 2021-03-10 DIAGNOSIS — M160 Bilateral primary osteoarthritis of hip: Secondary | ICD-10-CM | POA: Diagnosis not present

## 2021-03-10 DIAGNOSIS — E1165 Type 2 diabetes mellitus with hyperglycemia: Secondary | ICD-10-CM | POA: Diagnosis not present

## 2021-03-10 DIAGNOSIS — R209 Unspecified disturbances of skin sensation: Secondary | ICD-10-CM | POA: Diagnosis not present

## 2021-03-14 DIAGNOSIS — Z79899 Other long term (current) drug therapy: Secondary | ICD-10-CM | POA: Diagnosis not present

## 2021-03-17 ENCOUNTER — Other Ambulatory Visit: Payer: Self-pay | Admitting: Obstetrics and Gynecology

## 2021-03-17 NOTE — Patient Outreach (Signed)
Care Coordination ? ?03/17/2021 ? ?[name] ?03-30-1980 ?[id number] ? ? ?Medicaid Managed Care  ? ?Unsuccessful Outreach Note ? ?03/17/2021 ?Name: Misty [name] MRN: [id number] DOB: Jan 06, 1980 ? ?Referred by: [place] ?Reason for referral : High Risk Managed Medicaid (Unsuccessful telephone outreach) ? ? ?An unsuccessful telephone outreach was attempted today. The patient was referred to the case management team for assistance with care management and care coordination.  ? ?Follow Up Plan: The care management team will reach out to the patient again over the next 7-14 business days.  ? ?[name] RN, BSN ?[facility] ?Care Management Coordinator - Managed Medicaid High Risk ?[phone] ?  ? ? ?

## 2021-03-17 NOTE — Patient Instructions (Signed)
Hi [name], I am sorry I did not reach you today-I hope you are doing okay.- as a part of your Medicaid benefit, you are eligible for care management and care coordination services at no cost or copay. I was unable to reach you by phone today but would be happy to help you with your health related needs. Please feel free to call me at [phone] ? ?A member of the Managed Medicaid care management team will reach out to you again over the next 7-14 business  days.  ? ?[name] RN, BSN ?[facility] [facility] ?Care Management Coordinator - Managed Medicaid High Risk ?[phone] ?  ?

## 2021-03-21 DIAGNOSIS — J069 Acute upper respiratory infection, unspecified: Secondary | ICD-10-CM | POA: Diagnosis not present

## 2021-03-21 DIAGNOSIS — Z20822 Contact with and (suspected) exposure to covid-19: Secondary | ICD-10-CM | POA: Diagnosis not present

## 2021-03-31 ENCOUNTER — Other Ambulatory Visit: Payer: Self-pay | Admitting: Obstetrics and Gynecology

## 2021-03-31 DIAGNOSIS — G4700 Insomnia, unspecified: Secondary | ICD-10-CM

## 2021-03-31 NOTE — Patient Instructions (Signed)
?Hi [name], thank you for speaking with me today-have a great afternoon and I hope you feel better. ? ?Misty [name] was given information about Medicaid Managed Care team care coordination services as a part of their [facility] Medicaid benefit. Misty [name] verbally consented to engagement with the [facility] Managed Care team.  ? ?If you are experiencing a medical emergency, please call 911 or report to your local emergency department or urgent care.  ? ?If you have a non-emergency medical problem during routine business hours, please contact your provider's office and ask to speak with a nurse.  ? ?For questions related to your [facility] health plan, please call: [phone] or go here:[email or web address] ? ?If you would like to schedule transportation through your [facility] plan, please call the following number at least 2 days in advance of your appointment: [phone]. ? You can also use the MTM portal or MTM mobile app to manage your rides. For the portal, please go to [email or web address]. ? ?Call the [place] at [phone], at any time, 24 hours a day, 7 days a week. If you are in danger or need immediate medical attention call 911. ? ?If you would like help to quit smoking, call 1-800-QUIT-NOW 6817202403) OR Espa?ol: 1-855-D?jelo-Ya [phone]) o para m?s informaci?n haga clic aqu? or Text READY to 200-400 to register via text ? ?Misty [name] - following are the goals we discussed in your visit today:  ? Goals Addressed   ? ?  ?  ?  ?  ? This Visit's Progress  ?  Protect My Health     ?  Timeframe:  Long-Range Goal ?Priority:  High ?Start Date:      06/23/20                       ?Expected End Date:      ongoing                ? ?Follow Up Date:  05/01/21 ?  ?- schedule appointment for flu shot ?- schedule appointment for vaccines needed due to my age or health ?- schedule recommended health tests (blood work,  mammogram, colonoscopy, pap test) ?- schedule and keep appointment for annual check-up  ?  ?Why is this important?   ?Screening tests can find diseases early when they are easier to treat.  ?Your doctor or nurse will talk with you about which tests are important for you.  ?Getting shots for common diseases like the flu and shingles will help prevent them.   ?03/31/21:  Patient with no upcoming appts-would like new PCP ?  ? ?Patient verbalizes understanding of instructions and care plan provided today and agrees to view in [place]. Active MyChart status confirmed with patient.   ? ?The Managed Medicaid care management team will reach out to the patient again over the next 30 days.  ?The  Patient has been provided with contact information for the Managed Medicaid care management team and has been advised to call with any health related questions or concerns.  ? ?[name] RN, BSN ?[facility] ?Care Management Coordinator - Managed Medicaid High Risk ?[phone] ?  ?Following is a copy of your plan of care:  ?Care Plan : General Plan of Care (Adult)  ?Updates made by [name], RN since 03/31/2021 12:00 AM  ?  ? ?Problem: Health Promotion or Disease Self-Management (General Plan of Care) Resolved 03/31/2021  ?Priority: High  ?Onset Date:  01/19/2020  ?  ? ?Care Plan : General Plan of Care (Adult)  ?Updates made by [name], RN since 03/31/2021 12:00 AM  ?  ? ?Problem: Health Promotion or Disease Self-Management (General Plan of Care)   ?Priority: High  ?Onset Date: 01/19/2020  ?  ? ?Long-Range Goal: Self-Management Plan Developed   ?Start Date: 01/19/2020  ?Expected End Date: 05/17/2021  ?Recent Progress: Not on track  ?Priority: High  ?Note:   ?Current Barriers:  ?Knowledge Deficits related to medications. ?Chronic Disease Management support and education needs. Patient with DM2, tobacco use, anxiety, chronic pain, headaches. Patient given 1-800-quit now phone number. ?03/31/21.   Patient states she would like new PCP-will refer to care Guide for resources.  Patient states her CBG meter is broken and she has not checked her blood sugars in 3 weeks-states she will follow up with provider/pharmacy for new one.  No change in smoking-1/2 ppd ? ?Nurse Case Manager Clinical Goal(s):  ?Over the next 90 days, patient will attend all scheduled medical appointments: ?Over the next 90 days, patient will work with CM team pharmacist to review medications. ?Over the next 90 days, patient will work with CM clinical social worker to address anxiety. ? ?Interventions:  ?Inter-disciplinary care team collaboration (see longitudinal plan of care) ?Evaluation of current treatment plan and patient's adherence to plan as established by provider. ?Reviewed medications with patient. ?Collaborated with pharmacy and social work. ?Discussed plans with patient for ongoing care management follow up and provided patient with direct contact information for care management team ?Reviewed scheduled/upcoming provider appointments ?Collaborated with BSW for Podiatry provider  and eye provider, utility resources ?BSW referral for Podiatrist and eye provider, utility resources ?Social Work referral for anxiety, resources for [employer] ?Update 06/23/20:  patient has met with Pharmacist and Social Worker-continues to follow.  ?Care Guide referral for PCP resources. ?Collaborated with Care Guide for resources ? ?Patient Goals/Self-Care Activities ?Over the next 30 days, patient will: ? -Attends all scheduled provider appointments ?Calls pharmacy for medication refills ?Calls provider office for new concerns or questions ?Obtain new CBG meter and check blood sugars ? ?Follow Up Plan: The Managed Medicaid care management team will reach out to the patient again over the next 30 days.  ?The patient has been provided with contact information for the Managed Medicaid care management team and has been advised to call with any health  related questions or concerns.   ?  ?

## 2021-03-31 NOTE — Patient Outreach (Signed)
?Medicaid Managed Care   ?Nurse Care Manager Note ? ?03/31/2021 ?Name:  Misty [name] MRN:  [id number] DOB:  [birth year] ? ?Misty [name] is an 41 y.o. year old female who is a primary patient of [facility] Coordination team was consulted for assistance with:    ?Chronic healthcare management needs, DM, tobacco use, anxiety, chronic pain, migraines, GERD ? ?[name] was given information about Medicaid Managed Care Coordination team services today. [place] Patient agreed to services and verbal consent obtained. ? ?Engaged with patient by telephone for follow up visit in response to provider referral for case management and/or care coordination services.  ? ?Assessments/Interventions:  Review of past medical history, allergies, medications, health status, including review of consultants reports, laboratory and other test data, was performed as part of comprehensive evaluation and provision of chronic care management services. ? ?SDOH (Social Determinants of Health) assessments and interventions performed: ?SDOH Interventions   ? ?Flowsheet Row Most Recent Value  ?SDOH Interventions   ?Depression Interventions/Treatment  Referral to Psychiatry  ? ?  ?Care Plan ? ?No Known Allergies ? ?Medications Reviewed Today   ? ? Reviewed by [name], RN (Registered Nurse) on 03/31/21 at 5  Med List Status: <None>  ? ?Medication Order Taking? Sig Documenting Provider Last Dose Status Informant  ?Accu-Chek FastClix Lancets MISC [id number] No Use twice per day to check your blood glucose.  E11.65 Jose Persia, MD Taking Active Self  ?diclofenac sodium (VOLTAREN) 1 % GEL [id number] No Apply 2 g topically 4 (four) times daily.  ?Patient not taking: No sig reported  ? Franchot Heidelberg, PA-C Not Taking Active   ?Dulaglutide (TRULICITY) 8.78 MV/6.7MC SOPN [id number] No Inject into the skin. [provider] Taking Active    ?DULoxetine (CYMBALTA) 30 MG capsule [id number] No Take 1 capsule (30 mg total) by mouth at bedtime. Take 1 tab nightly x 1 week then 2 tabs nightly- for nerve pain Courtney Heys, MD Taking Expired 02/08/21 2359   ?gabapentin (NEURONTIN) 300 MG capsule [id number] No Take 300 mg by mouth at bedtime.  ?Patient not taking: Reported on 02/08/2021  ? [provider] Not Taking Active   ?         ?Med Note [name], [name]   Sun Oct 06, 2018  8:29 AM)    ?glucose blood (ACCU-CHEK GUIDE) test strip [id number] No Use one strip, twice daily to check your blood glucose.  E11.65 Jose Persia, MD Taking Active Self  ?LANTUS SOLOSTAR 100 UNIT/ML Solostar Pen [id number] No SMARTSIG:10 Unit(s) SUB-Q Morning-Night [provider] Taking Active   ?lisinopril (ZESTRIL) 10 MG tablet [id number] No Take 10 mg by mouth daily.  ?Patient not taking: Reported on 07/08/2020  ? [provider] Not Taking Active   ?meloxicam (MOBIC) 7.5 MG tablet [id number] No meloxicam 7.5 mg tablet ? TAKE 1 TABLET BY MOUTH DAILY [provider] Taking Active   ?methocarbamol (ROBAXIN) 500 MG tablet [id number] No Take 500 mg by mouth 4 (four) times daily.  ?Patient not taking: Reported on 10/14/2020  ? [provider] Not Taking Active   ?oxyCODONE-acetaminophen (PERCOCET/ROXICET) 5-325 MG tablet [id number] No Take 1 tablet by mouth every 8 (eight) hours as needed for severe pain. Takes 4 times a day [provider] Taking Active Self  ?pioglitazone (ACTOS) 30 MG tablet [id number] No Take 1 tablet (30 mg total) by mouth daily.  ?Patient not taking: Reported on 10/14/2020  ? Jose Persia, MD  Not Taking Active   ?prazosin (MINIPRESS) 2 MG capsule [id number] No Take 2 mg by mouth at bedtime. [provider] Taking Active   ?predniSONE (DELTASONE) 5 MG tablet [id number] No Take by mouth daily with breakfast.  ?Patient not taking: Reported on 07/08/2020  ? [provider] Not Taking Active   ?pregabalin  (LYRICA) 75 MG capsule [id number] No Take 75 mg by mouth 2 (two) times daily. [provider] Taking Active Self  ?sertraline (ZOLOFT) 50 MG tablet [id number] No Take 50 mg by mouth daily. [provider] Taking Active   ?sitaGLIPtin (JANUVIA) 100 MG tablet [id number] No Take 1 tablet (100 mg total) by mouth daily.  ?Patient not taking: Reported on 01/19/2020  ? [name], MD Not Taking Active   ?traZODone (DESYREL) 50 MG tablet [id number] No Take 1 tablet (50 mg total) by mouth at bedtime. Jose Persia, MD Taking Active Self  ? ?  ?  ? ?  ? ?Patient Active Problem List  ? Diagnosis Date Noted  ? Myofascial pain dysfunction syndrome 11/25/2018  ? Nerve pain 11/25/2018  ? Hypertension 09/19/2018  ? Insomnia 09/19/2018  ? Hematuria 09/19/2018  ? Left ankle pain 06/25/2018  ? Lumbar spine pain 06/25/2018  ? Thoracic spine pain 06/25/2018  ? Cervical spine pain 06/25/2018  ? Hidradenitis suppurativa 01/18/2018  ? History of diverticulitis 10/03/2017  ? Constipation 10/03/2017  ? 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 Morbid obesity ([place]) 08/17/2017  ? Hypokalemia 08/17/2017  ? Headache 02/28/2017  ? Heart palpitations 06/04/2012  ? Tobacco use 06/03/2012  ? Preventative health care 05/03/2011  ? Diabetes type 2, uncontrolled 02/24/2008  ? Polycystic ovarian disease 07/27/2006  ? GERD 03/02/2006  ? ?Conditions to be addressed/monitored per PCP order:  Chronic healthcare management needs, DM, tobacco use, anxiety, chronic pain, migraines, GERD ? ?Care Plan : General Plan of Care (Adult)  ?Updates made by [name], RN since 03/31/2021 12:00 AM  ?  ? ?Problem: Health Promotion or Disease Self-Management (General Plan of Care) Resolved 03/31/2021  ?Priority: High  ?Onset Date: 01/19/2020  ?  ? ?Care Plan : General Plan of Care (Adult)  ?Updates made by [name], RN since 03/31/2021 12:00 AM  ?  ? ?Problem: Health Promotion or Disease Self-Management (General Plan of Care)   ?Priority: High   ?Onset Date: 01/19/2020  ?  ? ?Long-Range Goal: Self-Management Plan Developed   ?Start Date: 01/19/2020  ?Expected End Date: 05/17/2021  ?Recent Progress: Not on track  ?Priority: High  ?Note:   ?Current Barriers:  ?Knowledge Deficits related to medications. ?Chronic Disease Management support and education needs. Patient with DM2, tobacco use, anxiety, chronic pain, headaches. Patient given 1-800-quit now phone number. ?03/31/21.  Patient states she would like new PCP-will refer to care Guide for resources.  Patient states her CBG meter is broken and she has not checked her blood sugars in 3 weeks-states she will follow up with provider/pharmacy for new one.  No change in smoking-1/2 ppd ? ?Nurse Case Manager Clinical Goal(s):  ?Over the next 90 days, patient will attend all scheduled medical appointments: ?Over the next 90 days, patient will work with CM team pharmacist to review medications. ?Over the next 90 days, patient will work with CM clinical social worker to address anxiety. ? ?Interventions:  ?Inter-disciplinary care team collaboration (see longitudinal plan of care) ?Evaluation of current treatment plan and patient's adherence to plan as established by provider. ?Reviewed medications with patient. ?Collaborated with pharmacy and  social work. ?Discussed plans with patient for ongoing care management follow up and provided patient with direct contact information for care management team ?Reviewed scheduled/upcoming provider appointments ?Collaborated with BSW for Podiatry provider  and eye provider, utility resources ?BSW referral for Podiatrist and eye provider, utility resources ?Social Work referral for anxiety, resources for [employer] ?Update 06/23/20:  patient has met with Pharmacist and Social Worker-continues to follow.  ?Care Guide referral for PCP resources. ?Collaborated with Care Guide for resources ? ?Patient Goals/Self-Care Activities ?Over the next 30 days, patient will: ? -Attends all  scheduled provider appointments ?Calls pharmacy for medication refills ?Calls provider office for new concerns or questions ?Obtain new CBG meter and check blood sugars ? ?Follow Up Plan: The Managed Medicaid care

## 2021-04-02 DIAGNOSIS — Z419 Encounter for procedure for purposes other than remedying health state, unspecified: Secondary | ICD-10-CM | POA: Diagnosis not present

## 2021-04-04 ENCOUNTER — Telehealth: Payer: Self-pay

## 2021-04-04 NOTE — Telephone Encounter (Signed)
? ?  Telephone encounter was:  Unsuccessful.  04/04/2021 ?Name: Misty [name] MRN: [id number] DOB: [birth year] ? ?Unsuccessful outbound call made today to assist with:   PCP Search ? ?Outreach Attempt:  1st Attempt ? ?A HIPAA compliant voice message was left requesting a return call.  Instructed patient to call back at [phone] at their earliest convenience. ? ?Cameron Proud ?Care Guide, Embedded Care Coordination ?[facility]  Care management  ?[place], [place]  ?Main Phone: [phone]  E-mail: [email or web address]  ?Website: [email or web address] ? ? ? ?

## 2021-04-05 ENCOUNTER — Telehealth: Payer: Self-pay

## 2021-04-05 NOTE — Telephone Encounter (Signed)
? ?  Telephone encounter was:  Successful.  ?04/05/2021 ?Name: Misty [name] MRN: [id number] DOB: 03-05-1980 ? ?Misty [name] is a 41 y.o. year old female who is a primary care patient of [facility] resource team was consulted for assistance with  PCP Search ? ?Care guide performed the following interventions:  Pt would like a list of providers to be sent to her by e-mail and she will do her research as to who may be best for her. I have sent her Medicaid's Provider list as well as [facility]'s Providers and appointments based on her zip code. ? ?Follow Up Plan:  No further follow up planned at this time. The patient has been provided with needed resources. ? ?Cameron Proud ?Care Guide, Embedded Care Coordination ?[facility]  Care management  ?[place], [place]  ?Main Phone: [phone]  E-mail: [email or web address]  ?Website: [email or web address] ? ? ?

## 2021-04-11 DIAGNOSIS — Z79899 Other long term (current) drug therapy: Secondary | ICD-10-CM | POA: Diagnosis not present

## 2021-04-11 DIAGNOSIS — Z6834 Body mass index (BMI) 34.0-34.9, adult: Secondary | ICD-10-CM | POA: Diagnosis not present

## 2021-04-11 DIAGNOSIS — M545 Low back pain, unspecified: Secondary | ICD-10-CM | POA: Diagnosis not present

## 2021-04-11 DIAGNOSIS — E118 Type 2 diabetes mellitus with unspecified complications: Secondary | ICD-10-CM | POA: Diagnosis not present

## 2021-04-14 DIAGNOSIS — Z79899 Other long term (current) drug therapy: Secondary | ICD-10-CM | POA: Diagnosis not present

## 2021-04-22 DIAGNOSIS — R002 Palpitations: Secondary | ICD-10-CM | POA: Diagnosis not present

## 2021-05-02 DIAGNOSIS — Z419 Encounter for procedure for purposes other than remedying health state, unspecified: Secondary | ICD-10-CM | POA: Diagnosis not present

## 2021-05-06 ENCOUNTER — Ambulatory Visit: Payer: Medicaid Other

## 2021-05-06 DIAGNOSIS — R030 Elevated blood-pressure reading, without diagnosis of hypertension: Secondary | ICD-10-CM | POA: Diagnosis not present

## 2021-05-06 DIAGNOSIS — J209 Acute bronchitis, unspecified: Secondary | ICD-10-CM | POA: Diagnosis not present

## 2021-05-06 DIAGNOSIS — Z6834 Body mass index (BMI) 34.0-34.9, adult: Secondary | ICD-10-CM | POA: Diagnosis not present

## 2021-05-06 DIAGNOSIS — Z79899 Other long term (current) drug therapy: Secondary | ICD-10-CM | POA: Diagnosis not present

## 2021-05-06 DIAGNOSIS — I10 Essential (primary) hypertension: Secondary | ICD-10-CM | POA: Diagnosis not present

## 2021-05-06 DIAGNOSIS — I517 Cardiomegaly: Secondary | ICD-10-CM | POA: Diagnosis not present

## 2021-05-06 DIAGNOSIS — Z6833 Body mass index (BMI) 33.0-33.9, adult: Secondary | ICD-10-CM | POA: Diagnosis not present

## 2021-05-06 DIAGNOSIS — M545 Low back pain, unspecified: Secondary | ICD-10-CM | POA: Diagnosis not present

## 2021-05-06 DIAGNOSIS — E118 Type 2 diabetes mellitus with unspecified complications: Secondary | ICD-10-CM | POA: Diagnosis not present

## 2021-05-10 DIAGNOSIS — Z79899 Other long term (current) drug therapy: Secondary | ICD-10-CM | POA: Diagnosis not present

## 2021-05-27 ENCOUNTER — Other Ambulatory Visit: Payer: Self-pay | Admitting: Obstetrics and Gynecology

## 2021-05-27 NOTE — Patient Instructions (Signed)
Hi [name], nice to speak with you today.  Please let me know if you cannot locate the PCP resources and need our assistance-have a nice afternoon!  [name] was given information about Medicaid Managed Care team care coordination services as a part of their [facility] Medicaid benefit. [name] verbally consented to engagement with the [facility] Managed Care team.   If you are experiencing a medical emergency, please call 911 or report to your local emergency department or urgent care.   If you have a non-emergency medical problem during routine business hours, please contact your provider's office and ask to speak with a nurse.   For questions related to your [name] health plan, please call: [phone] or go here:[email or web address]  If you would like to schedule transportation through your [facility] plan, please call the following number at least 2 days in advance of your appointment: [phone].  You can also use the MTM portal or MTM mobile app to manage your rides. For the portal, please go to [email or web address].  Call the [place] at [phone], at any time, 24 hours a day, 7 days a week. If you are in danger or need immediate medical attention call 911.  If you would like help to quit smoking, call 1-800-QUIT-NOW (707) 785-5267) OR Espaol: 1-855-Djelo-Ya (6-468-032-1224) o para ms informacin haga clic aqu or Text READY to 200-400 to register via text  [name] - following are the goals we discussed in your visit today:   Goals Addressed             This Visit's Progress    Protect My Health       Timeframe:  Long-Range Goal Priority:  High Start Date:      06/23/20                       Expected End Date:      ongoing                 Follow Up Date:  06/28/21   - schedule appointment for flu shot - schedule appointment for vaccines needed due to my age or health - schedule  recommended health tests (blood work, mammogram, colonoscopy, pap test) - schedule and keep appointment for annual check-up    Why is this important?   Screening tests can find diseases early when they are easier to treat.  Your doctor or nurse will talk with you about which tests are important for you.  Getting shots for common diseases like the flu and shingles will help prevent them.   05/27/21:  Patient needs new PCP to prescribe medications.  Patient was provided PCP resources in April   Patient verbalizes understanding of instructions and care plan provided today and agrees to view in [place]. Active MyChart status and patient understanding of how to access instructions and care plan via MyChart confirmed with patient.     The Managed Medicaid care management team will reach out to the patient again over the next 30 business  days.  The  Patient has been provided with contact information for the Managed Medicaid care management team and has been advised to call with any health related questions or concerns.   [name] RN, BSN [place] Management Coordinator - Managed Medicaid High Risk [phone]   Following is a copy of your plan of care:  Care Plan : General Plan of Care (Adult)  Updates made  by [name], RN since 05/27/2021 12:00 AM     Problem: Health Promotion or Disease Self-Management (General Plan of Care)   Priority: High  Onset Date: 01/19/2020     Long-Range Goal: Self-Management Plan Developed   Start Date: 01/19/2020  Expected End Date: 08/27/2021  Recent Progress: Not on track  Priority: High  Note:   Current Barriers:  Knowledge Deficits related to medications. Chronic Disease Management support and education needs. Patient with DM2, tobacco use, anxiety, chronic pain, headaches. Patient given 1-800-quit now phone number. 05/27/21:  Patient states she does not have new PCP yet-resources provided in April via email.   Patient states she does not have a meter and needs to get medications prescribed.  No complaints today-pain and headaches managed with medications.  Continues to smoke 1/2 ppd  Nurse Case Manager Clinical Goal(s):  Over the next 90 days, patient will attend all scheduled medical appointments: Over the next 90 days, patient will work with CM team pharmacist to review medications. Over the next 90 days, patient will work with CM clinical social worker to address anxiety. Over the next 30 days, patient will schedule  an appointment  with a PCP.   Interventions:  Inter-disciplinary care team collaboration (see longitudinal plan of care) Evaluation of current treatment plan and patient's adherence to plan as established by provider. Reviewed medications with patient. Collaborated with pharmacy and social work. Discussed plans with patient for ongoing care management follow up and provided patient with direct contact information for care management team Reviewed scheduled/upcoming provider appointments Collaborated with BSW for Podiatry provider  and eye provider, utility resources BSW referral for Podiatrist and eye provider, utility resources-completed Social Work referral for anxiety, resources for light bill-completed. Update 06/23/20:  patient has met with Pharmacist and Social Worker-continues to follow.  Care Guide referral for PCP resources-completed. Collaborated with Care Guide for resources  Patient Goals/Self-Care Activities Over the next 30 days, patient will:  -Attends all scheduled provider appointments Calls pharmacy for medication refills Calls provider office for new concerns or questions Obtain new CBG meter and check blood sugars  Follow Up Plan: The Managed Medicaid care management team will reach out to the patient again over the next 30 days.  The patient has been provided with contact information for the Managed Medicaid care management team and has been advised to call  with any health related questions or concerns.

## 2021-05-27 NOTE — Patient Outreach (Signed)
Medicaid Managed Care   Nurse Care Manager Note  05/27/2021 Name:  Misty [name] MRN:  [id number] DOB:  04/27/1980  Misty [name] is an 41 y.o. year old female who is a primary patient of [facility] [place] Coordination team was consulted for assistance with:    Chronic healthcare management needs, tobacco use, anxiety, chronic pain, headaches, DM  [name] was given information about Medicaid Managed Care Coordination team services today. [place] Patient agreed to services and verbal consent obtained.  Engaged with patient by telephone for follow up visit in response to provider referral for case management and/or care coordination services.   Assessments/Interventions:  Review of past medical history, allergies, medications, health status, including review of consultants reports, laboratory and other test data, was performed as part of comprehensive evaluation and provision of chronic care management services.  SDOH (Social Determinants of Health) assessments and interventions performed: SDOH Interventions    Flowsheet Row Most Recent Value  SDOH Interventions   Stress Interventions Intervention Not Indicated      Care Plan  No Known Allergies  Medications Reviewed Today     Reviewed by [name], RN (Registered Nurse) on 03/31/21 at 41  Med List Status: <None>   Medication Order Taking? Sig Documenting Provider Last Dose Status Informant  Accu-Chek FastClix Lancets MISC [id number] No Use twice per day to check your blood glucose.  E11.65 Jose Persia, MD Taking Active Self  diclofenac sodium (VOLTAREN) 1 % GEL [id number] No Apply 2 g topically 4 (four) times daily.  Patient not taking: No sig reported   Caccavale, Sophia, PA-C Not Taking Active   Dulaglutide (TRULICITY) 0.86 VH/8.4ON SOPN [id number] No Inject into the skin. [provider] Taking Active   DULoxetine  (CYMBALTA) 30 MG capsule [id number] No Take 1 capsule (30 mg total) by mouth at bedtime. Take 1 tab nightly x 1 week then 2 tabs nightly- for nerve pain Lovorn, Megan, MD Taking Expired 02/08/21 2359   gabapentin (NEURONTIN) 300 MG capsule [id number] No Take 300 mg by mouth at bedtime.  Patient not taking: Reported on 02/08/2021   [provider] Not Taking Active            Med Note [name], [name]   Sun Oct 06, 2018  8:29 AM)    glucose blood (ACCU-CHEK GUIDE) test strip [id number] No Use one strip, twice daily to check your blood glucose.  E11.65 Jose Persia, MD Taking Active Self  LANTUS SOLOSTAR 100 UNIT/ML Solostar Pen [id number] No SMARTSIG:10 Unit(s) SUB-Q Morning-Night [provider] Taking Active   lisinopril (ZESTRIL) 10 MG tablet [id number] No Take 10 mg by mouth daily.  Patient not taking: Reported on 07/08/2020   [provider] Not Taking Active   meloxicam (MOBIC) 7.5 MG tablet [id number] No meloxicam 7.5 mg tablet  TAKE 1 TABLET BY MOUTH DAILY [provider] Taking Active   methocarbamol (ROBAXIN) 500 MG tablet [id number] No Take 500 mg by mouth 4 (four) times daily.  Patient not taking: Reported on 10/14/2020   [provider] Not Taking Active   oxyCODONE-acetaminophen (PERCOCET/ROXICET) 5-325 MG tablet [id number] No Take 1 tablet by mouth every 8 (eight) hours as needed for severe pain. Takes 4 times a day [provider] Taking Active Self  pioglitazone (ACTOS) 30 MG tablet [id number] No Take 1 tablet (30 mg total) by mouth daily.  Patient not taking: Reported on 10/14/2020   Jose Persia, MD Not  Taking Active   prazosin (MINIPRESS) 2 MG capsule [id number] No Take 2 mg by mouth at bedtime. [provider] Taking Active   predniSONE (DELTASONE) 5 MG tablet [id number] No Take by mouth daily with breakfast.  Patient not taking: Reported on 07/08/2020   [provider] Not Taking Active   pregabalin (LYRICA) 75  MG capsule [id number] No Take 75 mg by mouth 2 (two) times daily. [provider] Taking Active Self  sertraline (ZOLOFT) 50 MG tablet [id number] No Take 50 mg by mouth daily. [provider] Taking Active   sitaGLIPtin (JANUVIA) 100 MG tablet [id number] No Take 1 tablet (100 mg total) by mouth daily.  Patient not taking: Reported on 01/19/2020   [name], MD Not Taking Active   traZODone (DESYREL) 50 MG tablet [id number] No Take 1 tablet (50 mg total) by mouth at bedtime. 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[place]) 08/17/2017   Morbid obesity ([place]) 08/17/2017   Hypokalemia 08/17/2017   Headache 02/28/2017   Heart palpitations 06/04/2012   Tobacco use 06/03/2012   Preventative health care 05/03/2011   Diabetes type 2, uncontrolled 02/24/2008   Polycystic ovarian disease 07/27/2006   GERD 03/02/2006   Conditions to be addressed/monitored per PCP order:  Chronic healthcare management needs, tobacco use, anxiety, chronic pain, headaches, DM, GERD  Care Plan : General Plan of Care (Adult)  Updates made by [name], RN since 05/27/2021 12:00 AM     Problem: Health Promotion or Disease Self-Management (General Plan of Care)   Priority: High  Onset Date: 01/19/2020     Long-Range Goal: Self-Management Plan Developed   Start Date: 01/19/2020  Expected End Date: 08/27/2021  Recent Progress: Not on track  Priority: High  Note:   Current Barriers:  Knowledge Deficits related to medications. Chronic  Disease Management support and education needs. Patient with DM2, tobacco use, anxiety, chronic pain, headaches. Patient given 1-800-quit now phone number. 05/27/21:  Patient states she does not have new PCP yet-resources provided in April via email.  Patient states she does not have a meter and needs to get medications prescribed.  No complaints today-pain and headaches managed with medications.  Continues to smoke 1/2 ppd  Nurse Case Manager Clinical Goal(s):  Over the next 90 days, patient will attend all scheduled medical appointments: Over the next 90 days, patient will work with CM team pharmacist to review medications. Over the next 90 days, patient will work with CM clinical social worker to address anxiety. Over the next 30 days, patient will schedule  an appointment  with a PCP.   Interventions:  Inter-disciplinary care team collaboration (see longitudinal plan of care) Evaluation of current treatment plan and patient's adherence to plan as established by provider. Reviewed medications with patient. Collaborated with pharmacy and social work. Discussed plans with patient for ongoing care management follow up and provided patient with direct contact information for care management team Reviewed scheduled/upcoming provider appointments Collaborated with BSW for Podiatry provider  and  eye provider, utility resources BSW referral for Podiatrist and eye provider, utility resources-completed Social Work referral for anxiety, resources for light bill-completed. Update 06/23/20:  patient has met with Pharmacist and Social Worker-continues to follow.  Care Guide referral for PCP resources-completed. Collaborated with Care Guide for resources  Patient Goals/Self-Care Activities Over the next 30 days, patient will:  -Attends all scheduled provider appointments Calls pharmacy for medication refills Calls provider office for new concerns or questions Obtain new CBG meter and check blood  sugars  Follow Up Plan: The Managed Medicaid care management team will reach out to the patient again over the next 30 days.  The patient has been provided with contact information for the Managed Medicaid care management team and has been advised to call with any health related questions or concerns.    Follow Up:  Patient agrees to Care Plan and Follow-up.  Plan: The Managed Medicaid care management team will reach out to the patient again over the next 30 business  days. and The  Patient has been provided with contact information for the Managed Medicaid care management team and has been advised to call with any health related questions or concerns.  Date/time of next scheduled RN care management/care coordination outreach:  06/28/21 at 1230

## 2021-06-02 DIAGNOSIS — Z419 Encounter for procedure for purposes other than remedying health state, unspecified: Secondary | ICD-10-CM | POA: Diagnosis not present

## 2021-06-03 DIAGNOSIS — R030 Elevated blood-pressure reading, without diagnosis of hypertension: Secondary | ICD-10-CM | POA: Diagnosis not present

## 2021-06-03 DIAGNOSIS — M545 Low back pain, unspecified: Secondary | ICD-10-CM | POA: Diagnosis not present

## 2021-06-03 DIAGNOSIS — E118 Type 2 diabetes mellitus with unspecified complications: Secondary | ICD-10-CM | POA: Diagnosis not present

## 2021-06-03 DIAGNOSIS — I10 Essential (primary) hypertension: Secondary | ICD-10-CM | POA: Diagnosis not present

## 2021-06-03 DIAGNOSIS — Z79899 Other long term (current) drug therapy: Secondary | ICD-10-CM | POA: Diagnosis not present

## 2021-06-03 DIAGNOSIS — Z6834 Body mass index (BMI) 34.0-34.9, adult: Secondary | ICD-10-CM | POA: Diagnosis not present

## 2021-06-28 ENCOUNTER — Other Ambulatory Visit: Payer: Self-pay | Admitting: Obstetrics and Gynecology

## 2021-07-01 DIAGNOSIS — M545 Low back pain, unspecified: Secondary | ICD-10-CM | POA: Diagnosis not present

## 2021-07-01 DIAGNOSIS — Z79899 Other long term (current) drug therapy: Secondary | ICD-10-CM | POA: Diagnosis not present

## 2021-07-01 DIAGNOSIS — E118 Type 2 diabetes mellitus with unspecified complications: Secondary | ICD-10-CM | POA: Diagnosis not present

## 2021-07-01 DIAGNOSIS — I10 Essential (primary) hypertension: Secondary | ICD-10-CM | POA: Diagnosis not present

## 2021-07-01 DIAGNOSIS — Z6833 Body mass index (BMI) 33.0-33.9, adult: Secondary | ICD-10-CM | POA: Diagnosis not present

## 2021-07-02 DIAGNOSIS — Z419 Encounter for procedure for purposes other than remedying health state, unspecified: Secondary | ICD-10-CM | POA: Diagnosis not present

## 2021-07-06 DIAGNOSIS — Z79899 Other long term (current) drug therapy: Secondary | ICD-10-CM | POA: Diagnosis not present

## 2021-07-29 DIAGNOSIS — Z6833 Body mass index (BMI) 33.0-33.9, adult: Secondary | ICD-10-CM | POA: Diagnosis not present

## 2021-07-29 DIAGNOSIS — R059 Cough, unspecified: Secondary | ICD-10-CM | POA: Diagnosis not present

## 2021-07-29 DIAGNOSIS — R030 Elevated blood-pressure reading, without diagnosis of hypertension: Secondary | ICD-10-CM | POA: Diagnosis not present

## 2021-07-29 DIAGNOSIS — Z79899 Other long term (current) drug therapy: Secondary | ICD-10-CM | POA: Diagnosis not present

## 2021-07-29 DIAGNOSIS — Z20822 Contact with and (suspected) exposure to covid-19: Secondary | ICD-10-CM | POA: Diagnosis not present

## 2021-08-01 DIAGNOSIS — H5213 Myopia, bilateral: Secondary | ICD-10-CM | POA: Diagnosis not present

## 2021-08-02 DIAGNOSIS — Z419 Encounter for procedure for purposes other than remedying health state, unspecified: Secondary | ICD-10-CM | POA: Diagnosis not present

## 2021-08-03 ENCOUNTER — Other Ambulatory Visit: Payer: Self-pay | Admitting: Obstetrics and Gynecology

## 2021-08-03 NOTE — Patient Outreach (Signed)
Care Coordination  08/03/2021  [name] August 05, 1980 [id number]   Medicaid Managed Care   Unsuccessful Outreach Note  08/03/2021 Name: Misty [name] MRN: [id number] DOB: 10/27/1980  Referred by: [facility] [place] Reason for referral : High Risk Managed Medicaid (Unsuccessful telephone outreach)   An unsuccessful telephone outreach was attempted today. The patient was referred to the case management team for assistance with care management and care coordination.   Follow Up Plan: The care management team will reach out to the patient again over the next 30 business  days.   [name] RN, BSN [place]  [facility] - Managed Medicaid High Risk [phone]

## 2021-08-03 NOTE — Patient Instructions (Signed)
Hi [name], I am sorry I missed you today-I hope you are doing okay - as a part of your Medicaid benefit, you are eligible for care management and care coordination services at no cost or copay. I was unable to reach you by phone today but would be happy to help you with your health related needs. Please feel free to call me at [phone].  A member of the Managed Medicaid care management team will reach out to you again over the next 30 business  days.   [name] RN, BSN [place]  [facility] - Managed Medicaid High Risk [phone]

## 2021-08-05 IMAGING — DX DG CHEST 2V
2 series · 2 of 2 positions shown · non-contrast
Comparison: 09/20/2017

CLINICAL DATA: Chest pain and shortness of breath

EXAM:
CHEST - 2 VIEW

[chest pa]
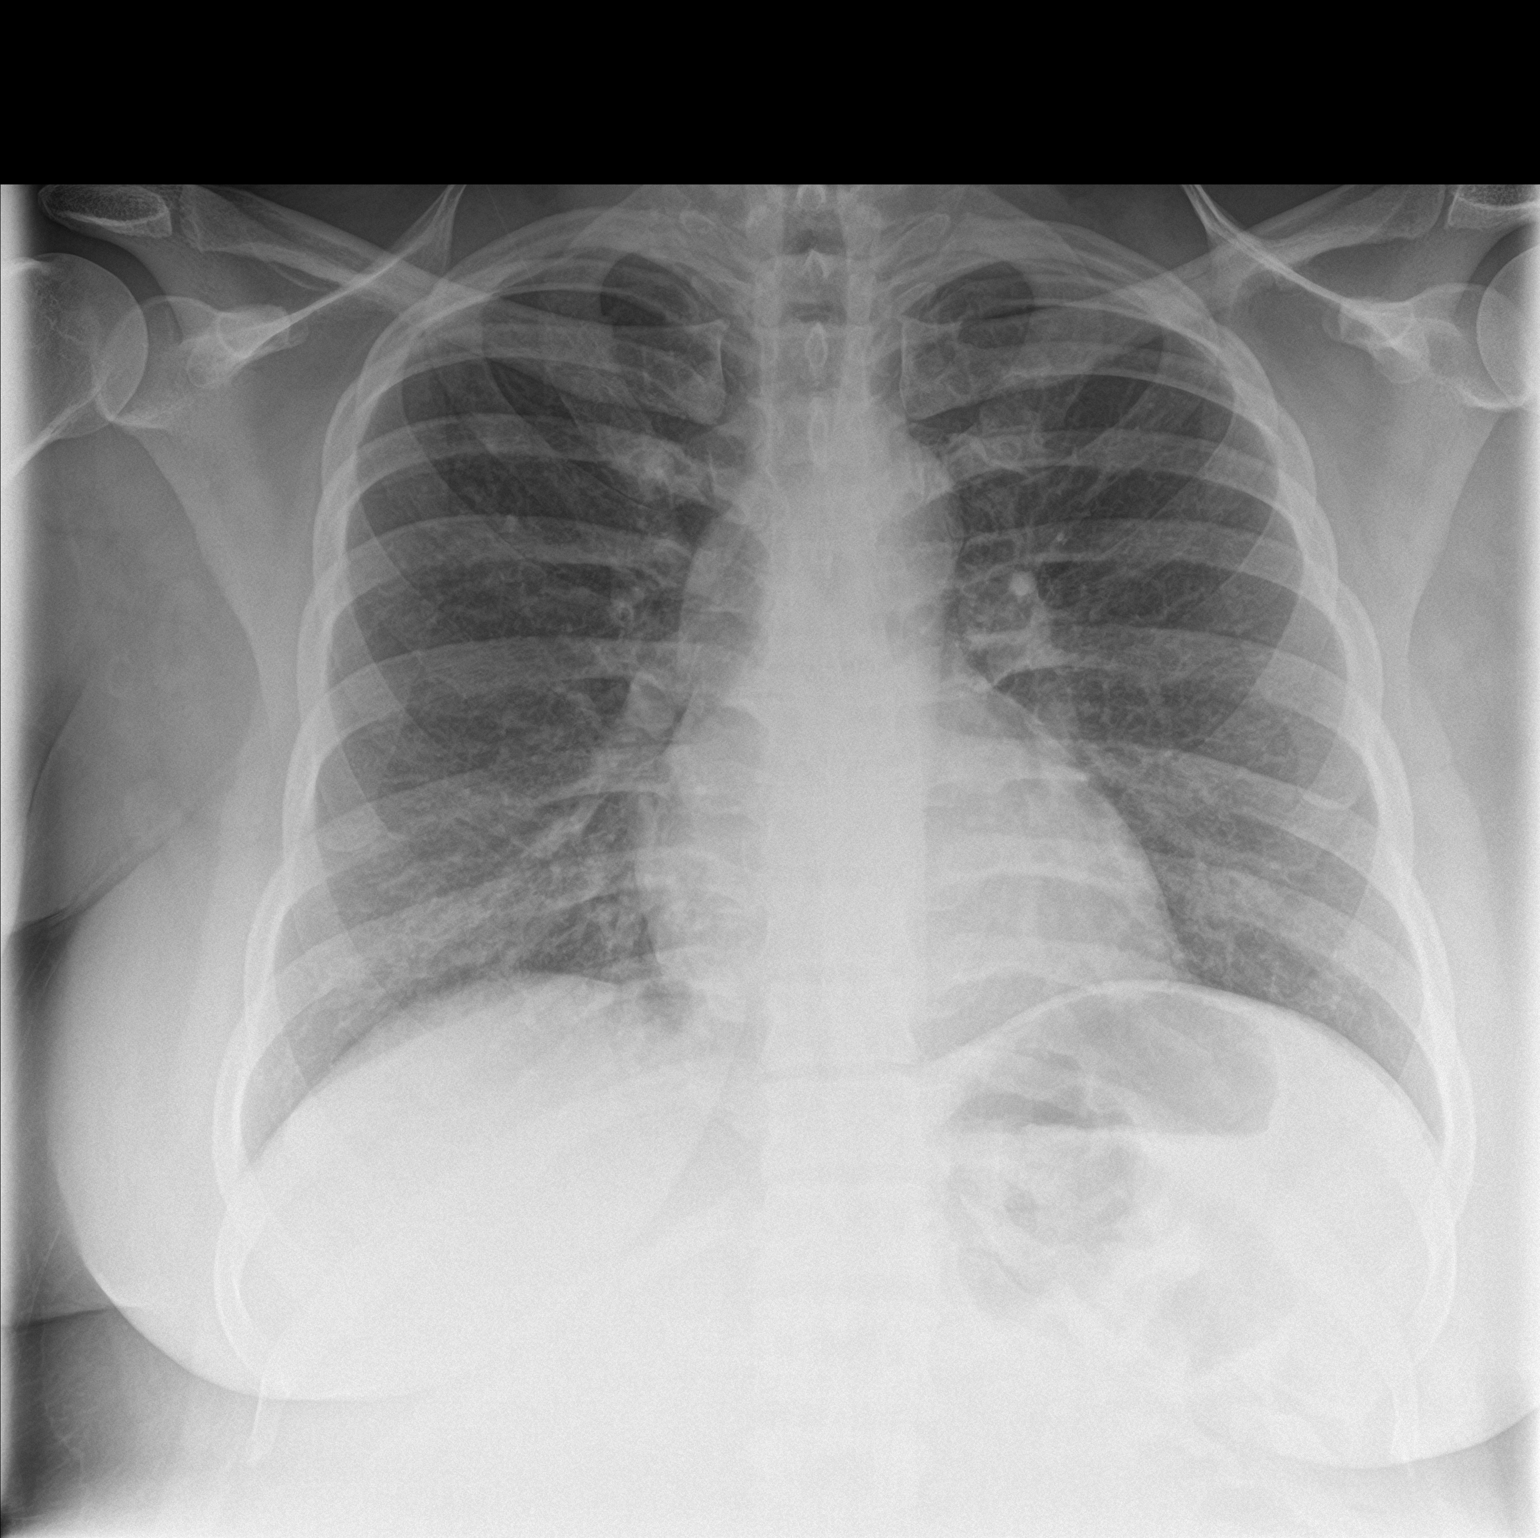

[chest lat]
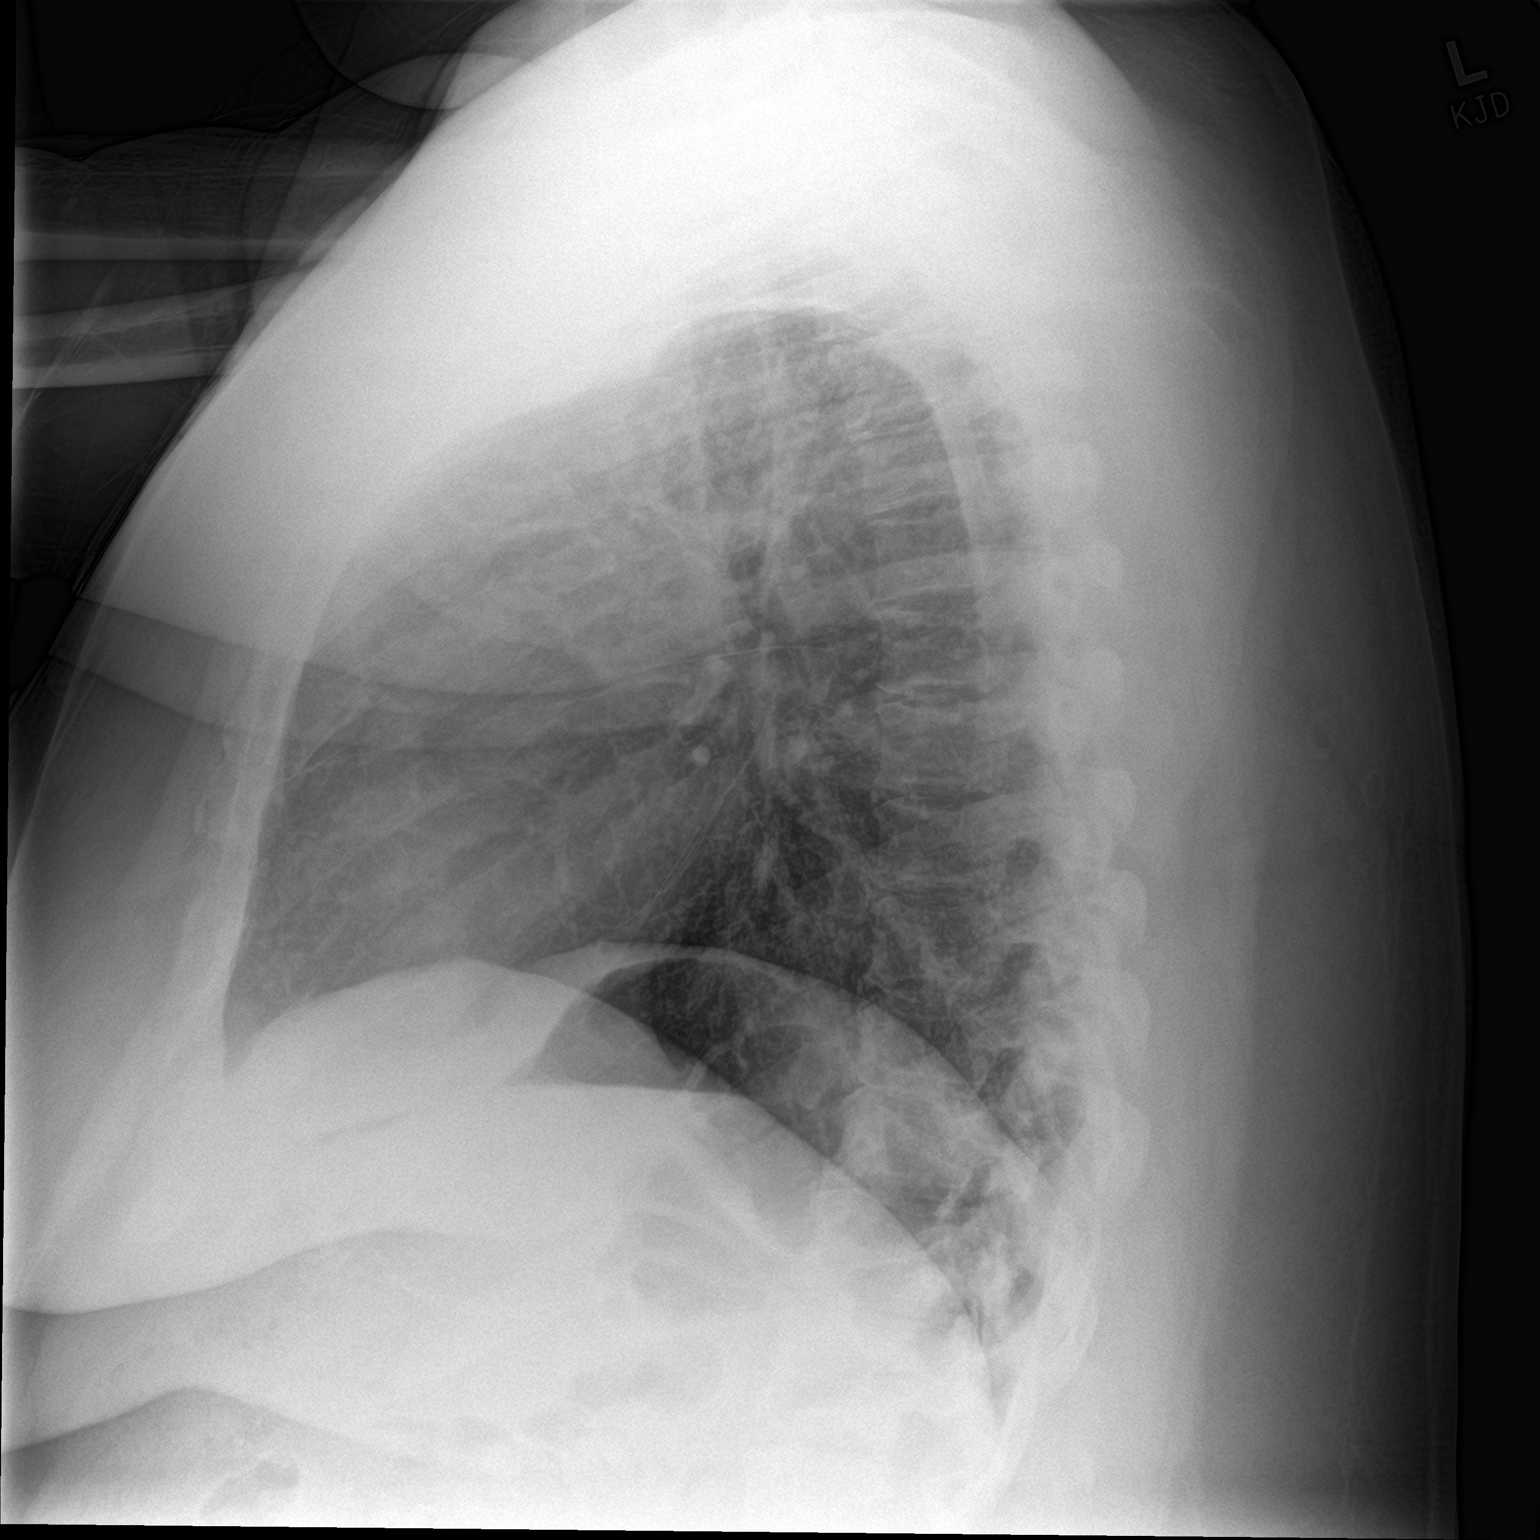

[2 of 2 positions shown; findings below may reference images not displayed]

FINDINGS: The heart size and mediastinal contours are within normal limits.
Both lungs are clear. The visualized skeletal structures are
unremarkable.
IMPRESSION: No active cardiopulmonary disease.

## 2021-08-07 IMAGING — CR DG CHEST 2V
2 series · 2 of 2 positions shown · non-contrast
Comparison: August 06, 2018

CLINICAL DATA: Left chest pain for 4 days.

EXAM:
CHEST - 2 VIEW

[chest pa]
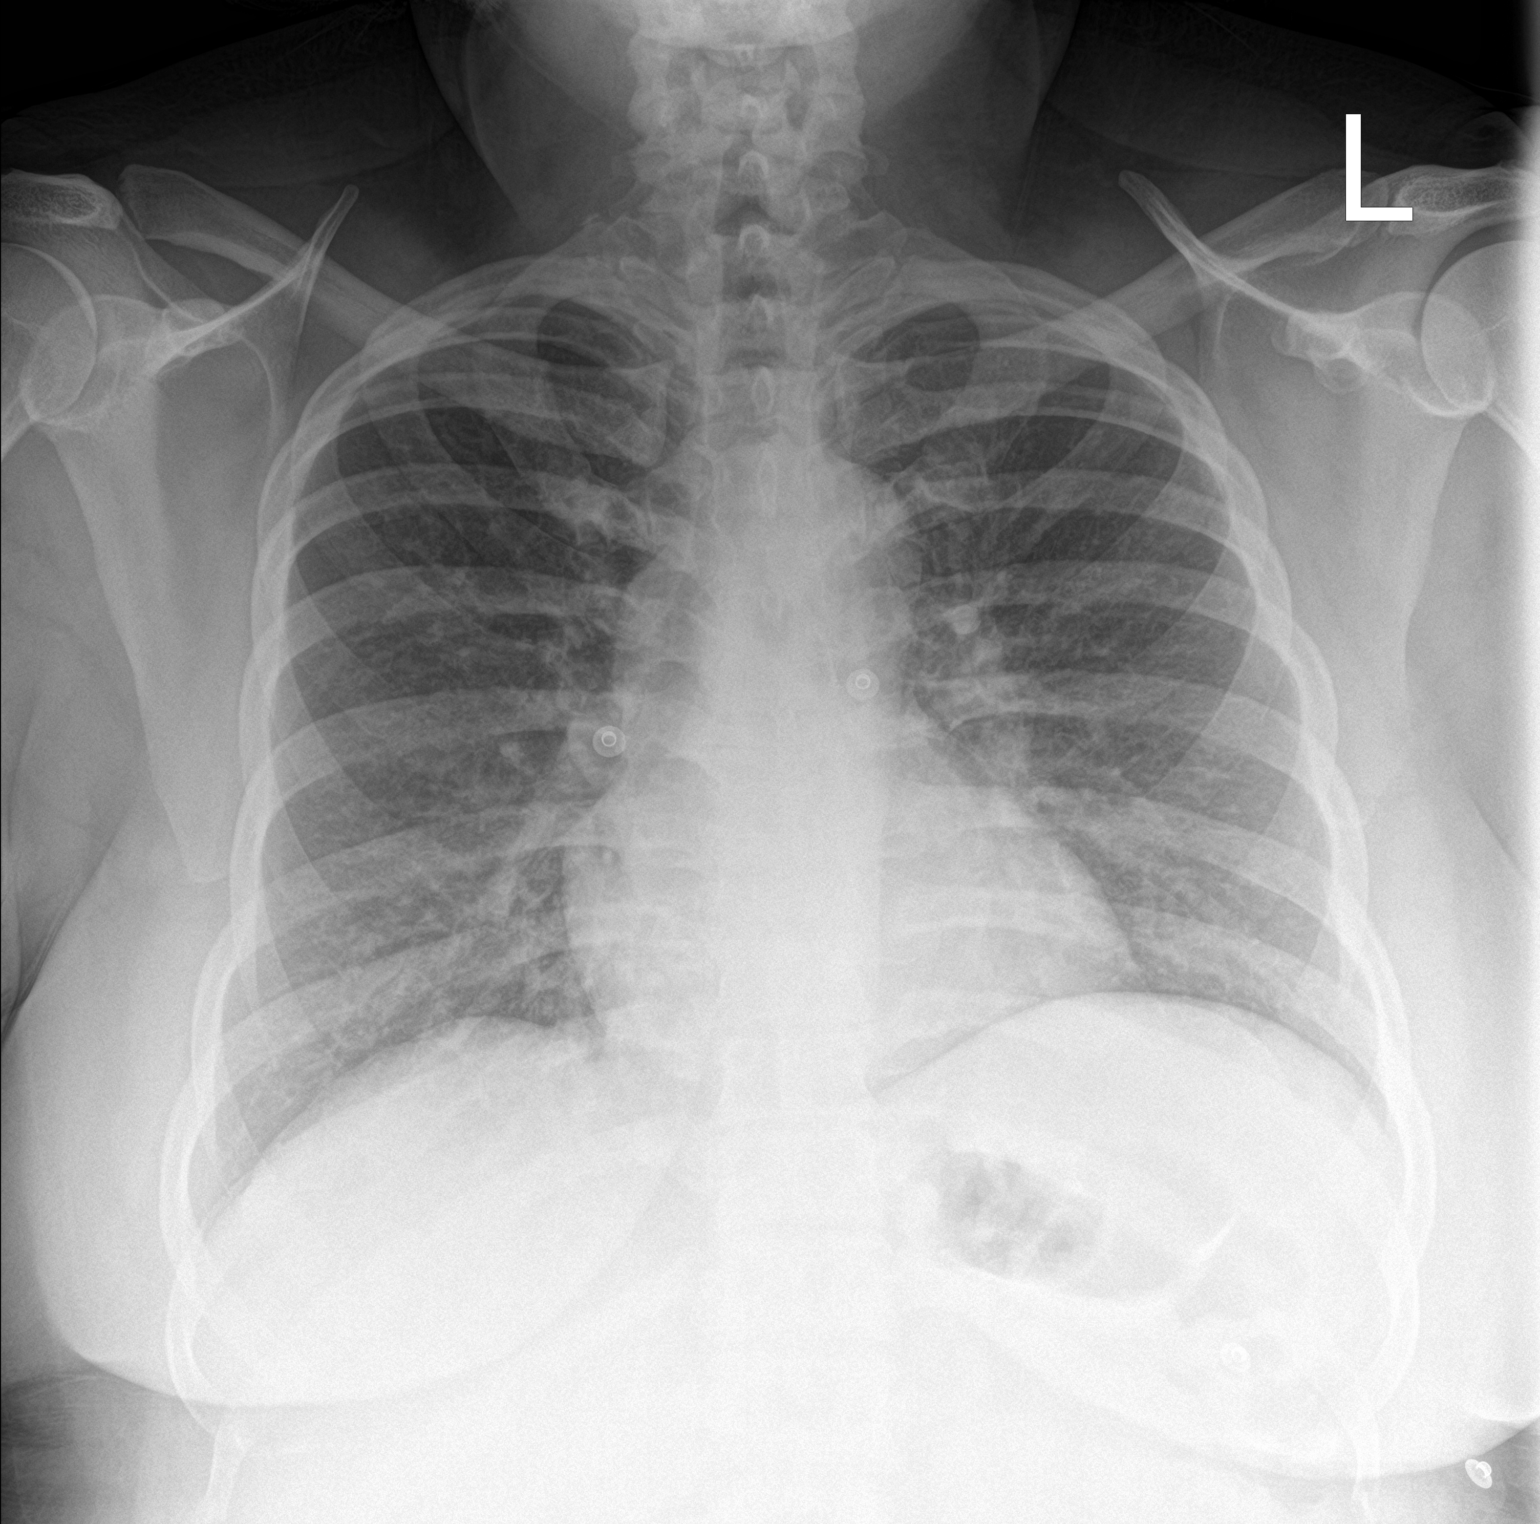

[chest lat]
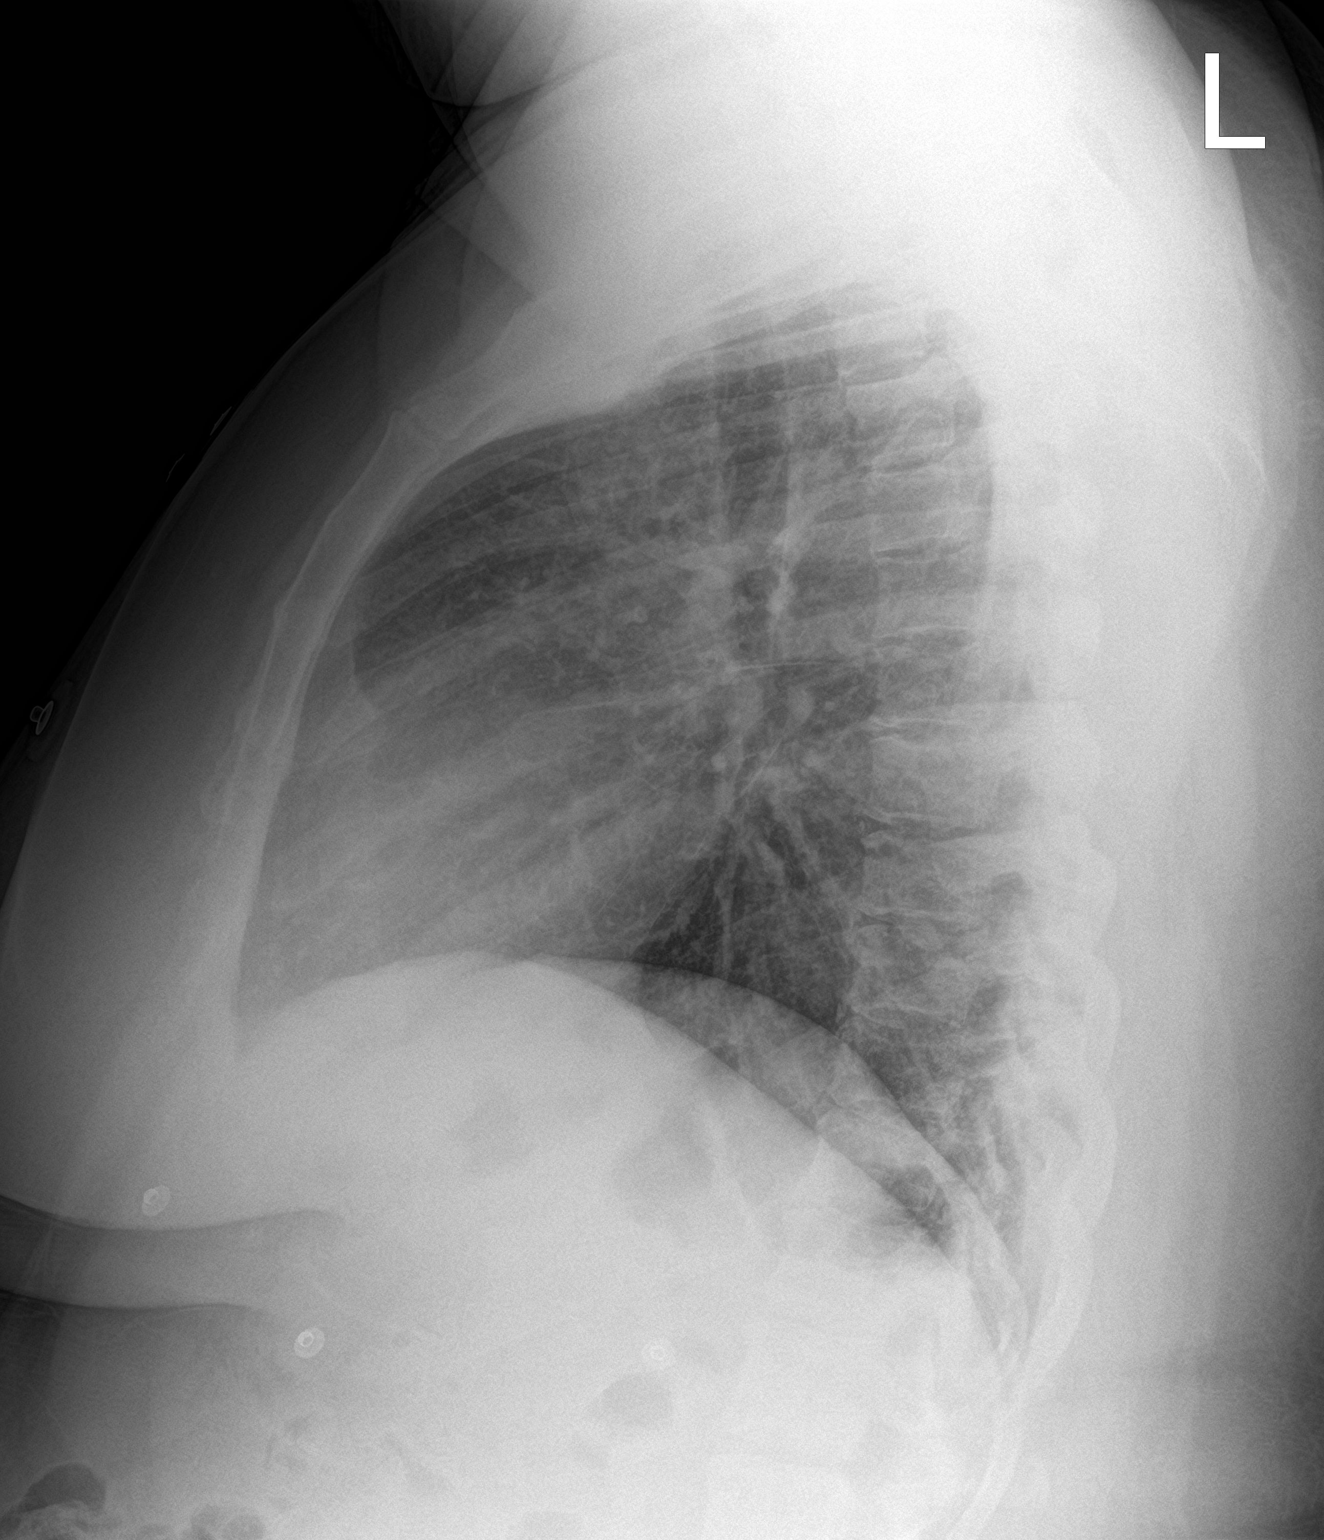

[2 of 2 positions shown; findings below may reference images not displayed]

FINDINGS: The heart size and mediastinal contours are within normal limits.
Both lungs are clear. The visualized skeletal structures are
unremarkable.
IMPRESSION: No active cardiopulmonary disease.

## 2021-08-08 DIAGNOSIS — Z79899 Other long term (current) drug therapy: Secondary | ICD-10-CM | POA: Diagnosis not present

## 2021-08-23 ENCOUNTER — Ambulatory Visit: Payer: Medicaid Other | Admitting: Podiatry

## 2021-08-26 DIAGNOSIS — Z6833 Body mass index (BMI) 33.0-33.9, adult: Secondary | ICD-10-CM | POA: Diagnosis not present

## 2021-08-26 DIAGNOSIS — Z79899 Other long term (current) drug therapy: Secondary | ICD-10-CM | POA: Diagnosis not present

## 2021-08-26 DIAGNOSIS — M545 Low back pain, unspecified: Secondary | ICD-10-CM | POA: Diagnosis not present

## 2021-08-30 DIAGNOSIS — Z79899 Other long term (current) drug therapy: Secondary | ICD-10-CM | POA: Diagnosis not present

## 2021-09-02 DIAGNOSIS — Z419 Encounter for procedure for purposes other than remedying health state, unspecified: Secondary | ICD-10-CM | POA: Diagnosis not present

## 2021-09-12 ENCOUNTER — Other Ambulatory Visit: Payer: Self-pay | Admitting: Obstetrics and Gynecology

## 2021-09-12 NOTE — Patient Instructions (Signed)
Hi [name], thanks for speaking with me-don't forget to schedule your f/u with your PCP-thank you!!  [name] was given information about Medicaid Managed Care team care coordination services as a part of their [facility] Medicaid benefit. Misty [name] verbally consented to engagement with the [facility] Managed Care team.   If you are experiencing a medical emergency, please call 911 or report to your local emergency department or urgent care.   If you have a non-emergency medical problem during routine business hours, please contact your provider's office and ask to speak with a nurse.   For questions related to your [facility] health plan, please call: [phone] or go here:[email or web address]  If you would like to schedule transportation through your [facility] plan, please call the following number at least 2 days in advance of your appointment: [phone].  You can also use the MTM portal or MTM mobile app to manage your rides. For the portal, please go to mtm.[email or web address].cy.  Call the [place] [facility] at 510-298-1084, at any time, 24 hours a day, 7 days a week. If you are in danger or need immediate medical attention call 911.  If you would like help to quit smoking, call 1-800-QUIT-NOW (254)715-1148) OR Espaol: 1-855-Djelo-Ya (6-378-588-5027) o para ms informacin haga clic aqu or Text READY to 200-400 to register via text  Misty [name] - following are the goals we discussed in your visit today:   Goals Addressed             This Visit's Progress    Protect My Health       Timeframe:  Long-Range Goal Priority:  High Start Date:      06/23/20                       Expected End Date:      ongoing                 Follow Up Date: 10/17/21   - schedule appointment for flu shot - schedule appointment for vaccines needed due to my age or health - schedule recommended health tests (blood work,  mammogram, colonoscopy, pap test) - schedule and keep appointment for annual check-up    Why is this important?   Screening tests can find diseases early when they are easier to treat.  Your doctor or nurse will talk with you about which tests are important for you.  Getting shots for common diseases like the flu and shingles will help prevent them.   09/12/21:  Patient to reschedule her appt with her PCP and get new meter.   Patient verbalizes understanding of instructions and care plan provided today and agrees to view in [place]. Active MyChart status and patient understanding of how to access instructions and care plan via MyChart confirmed with patient.     The Managed Medicaid care management team will reach out to the patient again over the next 30 business  days.  The  Patient has been provided with contact information for the Managed Medicaid care management team and has been advised to call with any health related questions or concerns.   [name] RN, BSN [place] Management Coordinator - Managed Medicaid High Risk [phone]   Following is a copy of your plan of care:  Care Plan : General Plan of Care (Adult)  Updates made by [name], RN since 09/12/2021 12:00 AM     Problem: Health  Promotion or Disease Self-Management (General Plan of Care)   Priority: High  Onset Date: 01/19/2020     Long-Range Goal: Self-Management Plan Developed   Start Date: 01/19/2020  Expected End Date: 12/12/2021  Recent Progress: Not on track  Priority: High  Note:   Current Barriers:  Knowledge Deficits related to medications. Chronic Disease Management support and education needs. Patient with DM2, tobacco use, anxiety, chronic pain, headaches. Patient given 1-800-quit now phone number. 09/12/21:  Patient to reschedule her appt with her PCP and get new meter-continues to smoke 1/2 ppd.  Recently received new glasses which has helped headaches.  Also  received dentures.  Nurse Case Manager Clinical Goal(s):  Over the next 90 days, patient will attend all scheduled medical appointments: Over the next 90 days, patient will work with CM team pharmacist to review medications. Over the next 90 days, patient will work with CM clinical social worker to address anxiety. Over the next 30 days, patient will schedule  an appointment  with a PCP and get new meter. Over the next 30 days, patient will take all of her medications as directed.  Interventions:  Inter-disciplinary care team collaboration (see longitudinal plan of care) Evaluation of current treatment plan and patient's adherence to plan as established by provider. Reviewed medications with patient. Collaborated with pharmacy and social work. Discussed plans with patient for ongoing care management follow up and provided patient with direct contact information for care management team Reviewed scheduled/upcoming provider appointments Collaborated with BSW for Podiatry provider  and eye provider, utility resources BSW referral for Podiatrist and eye provider, utility resources-completed Social Work referral for anxiety, resources for light bill-completed. Update 06/23/20:  patient has met with Pharmacist and Social Worker-continues to follow.  Care Guide referral for PCP resources-completed. Collaborated with Care Guide for resources  Patient Goals/Self-Care Activities Over the next 30 days, patient will:  -Attends all scheduled provider appointments Calls pharmacy for medication refills Calls provider office for new concerns or questions Obtain new CBG meter and check blood sugars Take all medications  Follow Up Plan: The Managed Medicaid care management team will reach out to the patient again over the next 30 business days.  The patient has been provided with contact information for the Managed Medicaid care management team and has been advised to call with any health related  questions or concerns.

## 2021-09-12 NOTE — Patient Outreach (Signed)
Medicaid Managed Care   Nurse Care Manager Note  09/12/2021 Name:  Misty [name] MRN:  [id number] DOB:  Mar 13, 1980  Misty [name] is an 41 y.o. year old female who is a primary patient of [facility] [place] Coordination team was consulted for assistance with:    Chronic healthcare management needs, tobacco use, anxiety, chronic pain, headache, DM, GERD, HTN  Misty [name] was given information about Medicaid Managed Care Coordination team services today. [place] Patient agreed to services and verbal consent obtained.  Engaged with patient by telephone for follow up visit in response to provider referral for case management and/or care coordination services.   Assessments/Interventions:  Review of past medical history, allergies, medications, health status, including review of consultants reports, laboratory and other test data, was performed as part of comprehensive evaluation and provision of chronic care management services.  SDOH (Social Determinants of Health) assessments and interventions performed: SDOH Interventions    Flowsheet Row Patient Outreach Telephone from 09/12/2021 in [place] Patient Outreach Telephone from 06/28/2021 in [place] Patient Outreach Telephone from 05/27/2021 in [place] Patient Outreach Telephone from 03/31/2021 in [place] Patient Outreach Telephone from 02/17/2021 in [place] Patient Outreach Telephone from 01/05/2021 in [place] Interventions        Food Insecurity Interventions -- -- -- -- Other (Comment)  [patient has resources that she utilizes as needed.] --  Housing Interventions -- Intervention Not Indicated -- -- --  --  Utilities Interventions Intervention Not Indicated -- -- -- -- --  Depression Interventions/Treatment  -- -- -- Referral to Psychiatry -- --  Financial Strain Interventions -- -- -- -- Other (Comment)  [BSW referral] --  Physical Activity Interventions -- Intervention Not Indicated, Other (Comments)  [not interested.] -- -- -- Other (Comments)  [sees Orthopedist]  Stress Interventions -- -- Intervention Not Indicated -- -- --  Social Connections Interventions -- -- -- -- -- Intervention Not Indicated      Care Plan  No Known Allergies  Medications Reviewed Today     Reviewed by [name], RN (Registered Nurse) on 09/12/21 at [place] List Status: <None>   Medication Order Taking? Sig Documenting Provider Last Dose Status Informant  Accu-Chek FastClix Lancets MISC [id number] No Use twice per day to check your blood glucose.  E11.65  Patient not taking: Reported on 05/27/2021   Jose Persia, MD Not Taking Active Self  diclofenac sodium (VOLTAREN) 1 % GEL [id number] No Apply 2 g topically 4 (four) times daily.  Patient not taking: No sig reported   Caccavale, Sophia, PA-C Not Taking Active   Dulaglutide (TRULICITY) 8.41 YS/0.6TK SOPN [id number] No Inject into the skin. [provider] Taking Active   DULoxetine (CYMBALTA) 30 MG capsule [id number] No Take 1 capsule (30 mg total) by mouth at bedtime. Take 1 tab nightly x 1 week then 2 tabs nightly- for nerve pain Lovorn, Megan, MD Taking Expired 02/08/21 2359   gabapentin (NEURONTIN) 300 MG capsule [id number] No Take 300 mg by mouth at bedtime.  Patient not taking: Reported on 02/08/2021   [provider] Not Taking Active            Med Note [name], [name]   Sun Oct 06, 2018  8:29 AM)    glucose blood (ACCU-CHEK GUIDE)  test strip [id number] No Use one strip, twice daily to check your blood glucose.  E11.65  Patient not taking: Reported on 05/27/2021   Jose Persia, MD Not Taking Active Self  LANTUS SOLOSTAR  100 UNIT/ML Solostar Pen [id number] No SMARTSIG:10 Unit(s) SUB-Q Morning-Night [provider] Taking Active   lisinopril (ZESTRIL) 10 MG tablet [id number] No Take 10 mg by mouth daily.  Patient not taking: Reported on 07/08/2020   [provider] Not Taking Active   meloxicam (MOBIC) 7.5 MG tablet [id number] No meloxicam 7.5 mg tablet  TAKE 1 TABLET BY MOUTH DAILY [provider] Taking Active   methocarbamol (ROBAXIN) 500 MG tablet [id number] No Take 500 mg by mouth 4 (four) times daily.  Patient not taking: Reported on 10/14/2020   [provider] Not Taking Active   oxyCODONE-acetaminophen (PERCOCET/ROXICET) 5-325 MG tablet [id number] No Take 1 tablet by mouth every 8 (eight) hours as needed for severe pain. Takes 4 times a day [provider] Taking Active Self  pioglitazone (ACTOS) 30 MG tablet [id number] No Take 1 tablet (30 mg total) by mouth daily.  Patient not taking: Reported on 10/14/2020   Jose Persia, MD Not Taking Active   prazosin (MINIPRESS) 2 MG capsule [id number] No Take 2 mg by mouth at bedtime. [provider] Taking Active   predniSONE (DELTASONE) 5 MG tablet [id number] No Take by mouth daily with breakfast.  Patient not taking: Reported on 07/08/2020   [provider] Not Taking Active   pregabalin (LYRICA) 75 MG capsule [id number] No Take 75 mg by mouth 2 (two) times daily. [provider] Taking Active Self  sertraline (ZOLOFT) 50 MG tablet [id number] No Take 50 mg by mouth daily. [provider] Taking Active   sitaGLIPtin (JANUVIA) 100 MG tablet [id number] No Take 1 tablet (100 mg total) by mouth daily.  Patient not taking: Reported on 01/19/2020   [name], MD Not Taking Active   traZODone (DESYREL) 50 MG tablet [id number] No Take 1 tablet (50 mg total) by mouth at bedtime. Jose Persia, MD Taking Active Self           Patient Active Problem List   Diagnosis Date Noted    Myofascial pain dysfunction syndrome 11/25/2018   Nerve pain 11/25/2018   Hypertension 09/19/2018   Insomnia 09/19/2018   Hematuria 09/19/2018   Left ankle pain 06/25/2018   Lumbar spine pain 06/25/2018   Thoracic spine pain 06/25/2018   Cervical spine pain 06/25/2018   Hidradenitis suppurativa 01/18/2018   History of diverticulitis 10/03/2017   Constipation 10/03/2017   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[place]) 08/17/2017   Morbid obesity ([place]) 08/17/2017   Hypokalemia 08/17/2017   Headache 02/28/2017   Heart palpitations 06/04/2012   Tobacco use 06/03/2012   Preventative health care 05/03/2011   Diabetes type 2, uncontrolled 02/24/2008   Polycystic ovarian disease 07/27/2006   GERD 03/02/2006   Conditions to be addressed/monitored per PCP order:  Chronic healthcare management needs, tobacco use, anxiety, chronic pain, headache, DM, GERD, HTN Care Plan : General Plan of Care (Adult)  Updates made by [name], RN since 09/12/2021 12:00 AM     Problem: Health Promotion or Disease Self-Management (General Plan of Care)   Priority: High  Onset Date: 01/19/2020     Long-Range Goal: Self-Management Plan Developed   Start Date: 01/19/2020  Expected End Date: 12/12/2021  Recent Progress: Not on track  Priority: High  Note:   Current Barriers:  Knowledge Deficits related to medications. Chronic  Disease Management support and education needs. Patient with DM2, tobacco use, anxiety, chronic pain, headaches. Patient given 1-800-quit now phone number. 09/12/21:  Patient to reschedule her appt with her PCP and get new meter-continues to smoke 1/2 ppd.  Recently received new glasses which has helped headaches.  Also received dentures.  Nurse Case Manager Clinical Goal(s):  Over the next 90 days, patient will attend all scheduled medical appointments: Over the next 90 days, patient will work with CM team pharmacist to review medications. Over the next 90 days, patient will work with CM clinical  social worker to address anxiety. Over the next 30 days, patient will schedule  an appointment  with a PCP and get new meter. Over the next 30 days, patient will take all of her medications as directed.  Interventions:  Inter-disciplinary care team collaboration (see longitudinal plan of care) Evaluation of current treatment plan and patient's adherence to plan as established by provider. Reviewed medications with patient. Collaborated with pharmacy and social work. Discussed plans with patient for ongoing care management follow up and provided patient with direct contact information for care management team Reviewed scheduled/upcoming provider appointments Collaborated with BSW for Podiatry provider  and eye provider, utility resources BSW referral for Podiatrist and eye provider, utility resources-completed Social Work referral for anxiety, resources for light bill-completed. Update 06/23/20:  patient has met with Pharmacist and Social Worker-continues to follow.  Care Guide referral for PCP resources-completed. Collaborated with Care Guide for resources  Patient Goals/Self-Care Activities Over the next 30 days, patient will:  -Attends all scheduled provider appointments Calls pharmacy for medication refills Calls provider office for new concerns or questions Obtain new CBG meter and check blood sugars Take all medications  Follow Up Plan: The Managed Medicaid care management team will reach out to the patient again over the next 30 business days.  The patient has been provided with contact information for the Managed Medicaid care management team and has been advised to call with any health related questions or concerns.    Follow Up:  Patient agrees to Care Plan and Follow-up.  Plan: The Managed Medicaid care management team will reach out to the patient again over the next 30 business  days. and The  Patient has been provided with contact information for the Managed Medicaid care  management team and has been advised to call with any health related questions or concerns.  Date/time of next scheduled RN care management/care coordination outreach: 10/17/21 at 0900.

## 2021-10-02 DIAGNOSIS — Z419 Encounter for procedure for purposes other than remedying health state, unspecified: Secondary | ICD-10-CM | POA: Diagnosis not present

## 2021-10-03 DIAGNOSIS — E1159 Type 2 diabetes mellitus with other circulatory complications: Secondary | ICD-10-CM | POA: Diagnosis not present

## 2021-10-03 DIAGNOSIS — Z794 Long term (current) use of insulin: Secondary | ICD-10-CM | POA: Diagnosis not present

## 2021-10-03 DIAGNOSIS — F418 Other specified anxiety disorders: Secondary | ICD-10-CM | POA: Diagnosis not present

## 2021-10-03 DIAGNOSIS — I25118 Atherosclerotic heart disease of native coronary artery with other forms of angina pectoris: Secondary | ICD-10-CM | POA: Diagnosis not present

## 2021-10-03 DIAGNOSIS — M1990 Unspecified osteoarthritis, unspecified site: Secondary | ICD-10-CM | POA: Diagnosis not present

## 2021-10-03 DIAGNOSIS — E559 Vitamin D deficiency, unspecified: Secondary | ICD-10-CM | POA: Diagnosis not present

## 2021-10-03 DIAGNOSIS — Z0000 Encounter for general adult medical examination without abnormal findings: Secondary | ICD-10-CM | POA: Diagnosis not present

## 2021-10-03 DIAGNOSIS — E669 Obesity, unspecified: Secondary | ICD-10-CM | POA: Diagnosis not present

## 2021-10-03 DIAGNOSIS — I10 Essential (primary) hypertension: Secondary | ICD-10-CM | POA: Diagnosis not present

## 2021-10-07 DIAGNOSIS — M545 Low back pain, unspecified: Secondary | ICD-10-CM | POA: Diagnosis not present

## 2021-10-07 DIAGNOSIS — Z79899 Other long term (current) drug therapy: Secondary | ICD-10-CM | POA: Diagnosis not present

## 2021-10-07 DIAGNOSIS — R030 Elevated blood-pressure reading, without diagnosis of hypertension: Secondary | ICD-10-CM | POA: Diagnosis not present

## 2021-10-07 DIAGNOSIS — Z6833 Body mass index (BMI) 33.0-33.9, adult: Secondary | ICD-10-CM | POA: Diagnosis not present

## 2021-10-07 DIAGNOSIS — R7309 Other abnormal glucose: Secondary | ICD-10-CM | POA: Diagnosis not present

## 2021-10-13 DIAGNOSIS — Z79899 Other long term (current) drug therapy: Secondary | ICD-10-CM | POA: Diagnosis not present

## 2021-10-17 ENCOUNTER — Other Ambulatory Visit: Payer: Self-pay | Admitting: Obstetrics and Gynecology

## 2021-10-17 DIAGNOSIS — I25118 Atherosclerotic heart disease of native coronary artery with other forms of angina pectoris: Secondary | ICD-10-CM | POA: Diagnosis not present

## 2021-10-17 DIAGNOSIS — K5909 Other constipation: Secondary | ICD-10-CM | POA: Diagnosis not present

## 2021-10-17 DIAGNOSIS — I10 Essential (primary) hypertension: Secondary | ICD-10-CM | POA: Diagnosis not present

## 2021-10-17 DIAGNOSIS — E1159 Type 2 diabetes mellitus with other circulatory complications: Secondary | ICD-10-CM | POA: Diagnosis not present

## 2021-10-17 DIAGNOSIS — M1990 Unspecified osteoarthritis, unspecified site: Secondary | ICD-10-CM | POA: Diagnosis not present

## 2021-10-17 DIAGNOSIS — E559 Vitamin D deficiency, unspecified: Secondary | ICD-10-CM | POA: Diagnosis not present

## 2021-10-17 DIAGNOSIS — F418 Other specified anxiety disorders: Secondary | ICD-10-CM | POA: Diagnosis not present

## 2021-10-17 DIAGNOSIS — Z794 Long term (current) use of insulin: Secondary | ICD-10-CM | POA: Diagnosis not present

## 2021-10-17 DIAGNOSIS — E669 Obesity, unspecified: Secondary | ICD-10-CM | POA: Diagnosis not present

## 2021-10-17 NOTE — Patient Outreach (Signed)
Medicaid Managed Care   Nurse Care Manager Note  10/17/2021 Name:  Misty WINFREE MRN:  [id number] DOB:  [name]  Misty [name] is an 41 y.o. year old female who is a primary patient of [facility] [place] Coordination team was consulted for assistance with:    Chronic healthcare management needs, tobacco use, anxiety, DM, chronic pain, HTN, headaches  Misty [name] was given information about Medicaid Managed Care Coordination team services today. [place] Patient agreed to services and verbal consent obtained.  Engaged with patient by telephone for follow up visit in response to provider referral for case management and/or care coordination services.   Assessments/Interventions:  Review of past medical history, allergies, medications, health status, including review of consultants reports, laboratory and other test data, was performed as part of comprehensive evaluation and provision of chronic care management services.  SDOH (Social Determinants of Health) assessments and interventions performed: SDOH Interventions    Flowsheet Row Patient Outreach Telephone from 09/12/2021 in [place] Patient Outreach Telephone from 06/28/2021 in [place] Patient Outreach Telephone from 05/27/2021 in [place] Patient Outreach Telephone from 03/31/2021 in [place] Patient Outreach Telephone from 02/17/2021 in [place] Patient Outreach Telephone from 01/05/2021 in [place] Interventions        Food Insecurity Interventions -- -- -- -- Other (Comment)  [patient has resources that she utilizes as needed.] --  Housing Interventions -- Intervention Not Indicated -- -- -- --   Utilities Interventions Intervention Not Indicated -- -- -- -- --  Depression Interventions/Treatment  -- -- -- Referral to Psychiatry -- --  Financial Strain Interventions -- -- -- -- Other (Comment)  [BSW referral] --  Physical Activity Interventions -- Intervention Not Indicated, Other (Comments)  [not interested.] -- -- -- Other (Comments)  [sees Orthopedist]  Stress Interventions -- -- Intervention Not Indicated -- -- --  Social Connections Interventions -- -- -- -- -- Intervention Not Indicated      Care Plan  No Known Allergies  Medications Reviewed Today     Reviewed by [name], RN (Registered Nurse) on 10/17/21 at [phone]  Med List Status: <None>   Medication Order Taking? Sig Documenting Provider Last Dose Status Informant  Accu-Chek FastClix Lancets MISC [id number] No Use twice per day to check your blood glucose.  E11.65  Patient not taking: Reported on 05/27/2021   Jose Persia, MD Not Taking Active Self  diclofenac sodium (VOLTAREN) 1 % GEL [id number] No Apply 2 g topically 4 (four) times daily.  Patient not taking: No sig reported   Caccavale, Sophia, PA-C Not Taking Active   Dulaglutide (TRULICITY) 7.10 GY/6.9SW SOPN [id number] No Inject into the skin. [provider] Taking Active   DULoxetine (CYMBALTA) 30 MG capsule [id number] No Take 1 capsule (30 mg total) by mouth at bedtime. Take 1 tab nightly x 1 week then 2 tabs nightly- for nerve pain Lovorn, Megan, MD Taking Expired 02/08/21 2359   gabapentin (NEURONTIN) 300 MG capsule [id number] No Take 300 mg by mouth at bedtime.  Patient not taking: Reported on 02/08/2021   [provider] Not Taking Active            Med Note [name], [name]   Sun Oct 06, 2018  8:29 AM)    glucose blood (ACCU-CHEK GUIDE) test  strip [id number] No Use one strip, twice daily to check your blood glucose.  E11.65  Patient not taking: Reported on 05/27/2021   Jose Persia, MD Not Taking Active Self  LANTUS SOLOSTAR 100  UNIT/ML Solostar Pen [id number] No SMARTSIG:10 Unit(s) SUB-Q Morning-Night [provider] Taking Active   lisinopril (ZESTRIL) 10 MG tablet [id number] No Take 10 mg by mouth daily.  Patient not taking: Reported on 07/08/2020   [provider] Not Taking Active   meloxicam (MOBIC) 7.5 MG tablet [id number] No meloxicam 7.5 mg tablet  TAKE 1 TABLET BY MOUTH DAILY [provider] Taking Active   methocarbamol (ROBAXIN) 500 MG tablet [id number] No Take 500 mg by mouth 4 (four) times daily.  Patient not taking: Reported on 10/14/2020   [provider] Not Taking Active   oxyCODONE-acetaminophen (PERCOCET/ROXICET) 5-325 MG tablet [id number] No Take 1 tablet by mouth every 8 (eight) hours as needed for severe pain. Takes 4 times a day [provider] Taking Active Self  pioglitazone (ACTOS) 30 MG tablet [id number] No Take 1 tablet (30 mg total) by mouth daily.  Patient not taking: Reported on 10/14/2020   Jose Persia, MD Not Taking Active   prazosin (MINIPRESS) 2 MG capsule [id number] No Take 2 mg by mouth at bedtime. [provider] Taking Active   predniSONE (DELTASONE) 5 MG tablet [id number] No Take by mouth daily with breakfast.  Patient not taking: Reported on 07/08/2020   [provider] Not Taking Active   pregabalin (LYRICA) 75 MG capsule [id number] No Take 75 mg by mouth 2 (two) times daily. [provider] Taking Active Self  sertraline (ZOLOFT) 50 MG tablet [id number] No Take 50 mg by mouth daily. [provider] Taking Active   sitaGLIPtin (JANUVIA) 100 MG tablet [id number] No Take 1 tablet (100 mg total) by mouth daily.  Patient not taking: Reported on 01/19/2020   [name], MD Not Taking Active   traZODone (DESYREL) 50 MG tablet [id number] No Take 1 tablet (50 mg total) by mouth at bedtime. 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[place]) 08/17/2017   Morbid obesity ([place]) 08/17/2017   Hypokalemia 08/17/2017   Headache 02/28/2017   Heart palpitations 06/04/2012   Tobacco use 06/03/2012   Preventative health care 05/03/2011   Diabetes type 2, uncontrolled 02/24/2008   Polycystic ovarian disease 07/27/2006   GERD 03/02/2006   Conditions to be addressed/monitored per PCP order:  Chronic healthcare management needs, tobacco use, anxiety, DM, chronic pain, HTN, headaches  Care Plan : General Plan of Care (Adult)  Updates made by [name], RN since 10/17/2021 12:00 AM     Problem: Health Promotion or Disease Self-Management (General Plan of Care)   Priority: High  Onset Date: 01/19/2020     Long-Range Goal: Self-Management Plan Developed   Start Date: 01/19/2020  Expected End Date: 12/12/2021  Recent Progress: Not on track  Priority: High  Note:   Current Barriers:  Knowledge Deficits related to medications. Chronic Disease  Management support and education needs. Patient with DM2, tobacco use, anxiety, chronic pain, headaches. Patient given 1-800-quit now phone number. 10/17/21:  Patient has appointment with PCP today, needs GI referral for constipation, will also follow up on prescription for new meter and bilateral carpal tunnel. Continues to smoke 1/2 ppd and not interested in stopping right now due to stress-to make an appointment with Psychiatry.  All other symptoms managed with medications right now.  Nurse Case Manager Clinical Goal(s):  Over the next 90 days, patient will attend all scheduled medical appointments: Over  the next 90 days, patient will work with CM team pharmacist to review medications. Over the next 90 days, patient will work with Psychiatry to address anxiety. Over the next 30 days, patient will schedule  an appointment  with a PCP and get new meter. Over the next 30 days, patient will take all of her medications as directed.  Interventions:  Inter-disciplinary care team collaboration (see longitudinal plan of care) Evaluation of current treatment plan and patient's adherence to plan as established by provider. Reviewed medications with patient. Collaborated with pharmacy and social work. Discussed plans with patient for ongoing care management follow up and provided patient with direct contact information for care management team Reviewed scheduled/upcoming provider appointments Collaborated with BSW for Podiatry provider  and eye provider, utility resources BSW referral for Podiatrist and eye provider, utility resources-completed Social Work referral for anxiety, resources for light bill-completed. Update 06/23/20:  patient has met with Pharmacist and Social Worker-continues to follow.  Care Guide referral for PCP resources-completed. Collaborated with Care Guide for resources  Patient Goals/Self-Care Activities Over the next 30 days, patient will:  -Attends all scheduled provider appointments Calls pharmacy for medication refills Calls provider office for new concerns or questions Obtain new CBG meter and check blood sugars Take all medications  Follow Up Plan: The Managed Medicaid care management team will reach out to the patient again over the next 30 business days.  The patient has been provided with contact information for the Managed Medicaid care management team and has been advised to call with any health related questions or concerns.    Follow Up:  Patient agrees to Care Plan and Follow-up.  Plan: The Managed Medicaid care management team will reach out to the patient  again over the next 30 business  days. and The  Patient has been provided with contact information for the Managed Medicaid care management team and has been advised to call with any health related questions or concerns.  Date/time of next scheduled RN care management/care coordination outreach: 11/15/21 at 1230.

## 2021-10-17 NOTE — Patient Instructions (Signed)
Hi [name], thanks for speaking with me, just call me [name] when you have time with your updated medications.  [name] was given information about Medicaid Managed Care team care coordination services as a part of their [facility] Medicaid benefit. Misty [name] verbally consented to engagement with the [facility] Managed Care team.   If you are experiencing a medical emergency, please call 911 or report to your local emergency department or urgent care.   If you have a non-emergency medical problem during routine business hours, please contact your provider's office and ask to speak with a nurse.   For questions related to your [facility] health plan, please call: [phone] or go here:[email or web address]  If you would like to schedule transportation through your [facility] plan, please call the following number at least 2 days in advance of your appointment: [phone].  You can also use the MTM portal or MTM mobile app to manage your rides. For the portal, please go to [email or web address].  Call the [place] at [phone], at any time, 24 hours a day, 7 days a week. If you are in danger or need immediate medical attention call 911.  If you would like help to quit smoking, call 1-800-QUIT-NOW 303 868 8330) OR Espaol: 1-855-Djelo-Ya (1-497-026-3785) o para ms informacin haga clic aqu or Text READY to 200-400 to register via text  [name] - following are the goals we discussed in your visit today:   Goals Addressed             This Visit's Progress    Protect My Health       Timeframe:  Long-Range Goal Priority:  High Start Date:      06/23/20                       Expected End Date:      ongoing                 Follow Up Date: 11/17/21   - schedule appointment for flu shot - schedule appointment for vaccines needed due to my age or health - schedule recommended health tests (blood work,  mammogram, colonoscopy, pap test) - schedule and keep appointment for annual check-up    Why is this important?   Screening tests can find diseases early when they are easier to treat.  Your doctor or nurse will talk with you about which tests are important for you.  Getting shots for common diseases like the flu and shingles will help prevent them.   10/17/21:  patient has appt with PCP today   Patient verbalizes understanding of instructions and care plan provided today and agrees to view in [place]. Active MyChart status and patient understanding of how to access instructions and care plan via MyChart confirmed with patient.     The Managed Medicaid care management team will reach out to the patient again over the next 30 business  days.  The  Patient has been provided with contact information for the Managed Medicaid care management team and has been advised to call with any health related questions or concerns.   Misty Raider RN, BSN [place]  Triad Curator - Managed Medicaid High Risk [phone].   Following is a copy of your plan of care:  Care Plan : General Plan of Care (Adult)  Updates made by Misty Medicus, RN since 10/17/2021 12:00 AM     Problem: Health Promotion or  Disease Self-Management (General Plan of Care)   Priority: High  Onset Date: 01/19/2020     Long-Range Goal: Self-Management Plan Developed   Start Date: 01/19/2020  Expected End Date: 12/12/2021  Recent Progress: Not on track  Priority: High  Note:   Current Barriers:  Knowledge Deficits related to medications. Chronic Disease Management support and education needs. Patient with DM2, tobacco use, anxiety, chronic pain, headaches. Patient given 1-800-quit now phone number. 10/17/21:  Patient has appointment with PCP today, needs GI referral for constipation, will also follow up on prescription for new meter and bilateral carpal tunnel. Continues to smoke 1/2 ppd  and not interested in stopping right now due to stress-to make an appointment with Psychiatry.  All other symptoms managed with medications right now.  Nurse Case Manager Clinical Goal(s):  Over the next 90 days, patient will attend all scheduled medical appointments: Over the next 90 days, patient will work with CM team pharmacist to review medications. Over the next 90 days, patient will work with Psychiatry to address anxiety. Over the next 30 days, patient will schedule  an appointment  with a PCP and get new meter. Over the next 30 days, patient will take all of her medications as directed.  Interventions:  Inter-disciplinary care team collaboration (see longitudinal plan of care) Evaluation of current treatment plan and patient's adherence to plan as established by provider. Reviewed medications with patient. Collaborated with pharmacy and social work. Discussed plans with patient for ongoing care management follow up and provided patient with direct contact information for care management team Reviewed scheduled/upcoming provider appointments Collaborated with BSW for Podiatry provider  and eye provider, utility resources BSW referral for Podiatrist and eye provider, utility resources-completed Social Work referral for anxiety, resources for light bill-completed. Update 06/23/20:  patient has met with Pharmacist and Social Worker-continues to follow.  Care Guide referral for PCP resources-completed. Collaborated with Care Guide for resources  Patient Goals/Self-Care Activities Over the next 30 days, patient will:  -Attends all scheduled provider appointments Calls pharmacy for medication refills Calls provider office for new concerns or questions Obtain new CBG meter and check blood sugars Take all medications  Follow Up Plan: The Managed Medicaid care management team will reach out to the patient again over the next 30 business days.  The patient has been provided with contact  information for the Managed Medicaid care management team and has been advised to call with any health related questions or concerns.

## 2021-11-02 DIAGNOSIS — Z419 Encounter for procedure for purposes other than remedying health state, unspecified: Secondary | ICD-10-CM | POA: Diagnosis not present

## 2021-11-07 DIAGNOSIS — Z79899 Other long term (current) drug therapy: Secondary | ICD-10-CM | POA: Diagnosis not present

## 2021-11-07 DIAGNOSIS — N914 Secondary oligomenorrhea: Secondary | ICD-10-CM | POA: Diagnosis not present

## 2021-11-07 DIAGNOSIS — E559 Vitamin D deficiency, unspecified: Secondary | ICD-10-CM | POA: Diagnosis not present

## 2021-11-07 DIAGNOSIS — Z6834 Body mass index (BMI) 34.0-34.9, adult: Secondary | ICD-10-CM | POA: Diagnosis not present

## 2021-11-07 DIAGNOSIS — Z794 Long term (current) use of insulin: Secondary | ICD-10-CM | POA: Diagnosis not present

## 2021-11-07 DIAGNOSIS — R7309 Other abnormal glucose: Secondary | ICD-10-CM | POA: Diagnosis not present

## 2021-11-07 DIAGNOSIS — M79641 Pain in right hand: Secondary | ICD-10-CM | POA: Diagnosis not present

## 2021-11-07 DIAGNOSIS — E119 Type 2 diabetes mellitus without complications: Secondary | ICD-10-CM | POA: Diagnosis not present

## 2021-11-07 DIAGNOSIS — R3989 Other symptoms and signs involving the genitourinary system: Secondary | ICD-10-CM | POA: Diagnosis not present

## 2021-11-07 DIAGNOSIS — R5383 Other fatigue: Secondary | ICD-10-CM | POA: Diagnosis not present

## 2021-11-07 DIAGNOSIS — M79642 Pain in left hand: Secondary | ICD-10-CM | POA: Diagnosis not present

## 2021-11-07 DIAGNOSIS — I10 Essential (primary) hypertension: Secondary | ICD-10-CM | POA: Diagnosis not present

## 2021-11-07 DIAGNOSIS — E7800 Pure hypercholesterolemia, unspecified: Secondary | ICD-10-CM | POA: Diagnosis not present

## 2021-11-07 DIAGNOSIS — M545 Low back pain, unspecified: Secondary | ICD-10-CM | POA: Diagnosis not present

## 2021-11-15 ENCOUNTER — Other Ambulatory Visit: Payer: Medicaid Other | Admitting: Obstetrics and Gynecology

## 2021-11-15 ENCOUNTER — Encounter: Payer: Self-pay | Admitting: Obstetrics and Gynecology

## 2021-11-15 NOTE — Patient Outreach (Signed)
Care Coordination  11/15/2021  NIL BOLSER 23-Jun-1980 [id number]  RNCM called patient at scheduled time.  Patient answered phone and stated she was at hospital visiting Brother and asked to be called another time.   RNCM rescheduled appointment at patient request.  [name] RN, BSN [place] Management Coordinator - Managed [place] High Risk [phone]

## 2021-11-28 NOTE — Progress Notes (Deleted)
Cardiology Office Note:    Date:  11/28/2021   ID:  Misty [name], DOB November 07, 1980, MRN [id number]  PCP:  [facility]  Cardiologist:  None   Referring MD: Trey Sailors, PA   No chief complaint on file.   History of Present Illness:    Misty [name] is a 41 y.o. female with a hx of DM II, hyperlipidemia, primary hypertension, and h/o CAD.    ***  Past Medical History:  Diagnosis Date   Chest pain    multiple ED evaluations, most likely musculoskeletal (cervical radiculopathy), needs MRI spine   Diabetes mellitus    Diabetes mellitus without complication ([place])    Diverticulitis 2019   GERD (gastroesophageal reflux disease)    Gestational diabetes    Hidradenitis suppurativa 01/18/2018   Hypertension    PCOD (polycystic ovarian disease)    Followed by [facility], has been on metformin   PCOS (polycystic ovarian syndrome)     Past Surgical History:  Procedure Laterality Date   CESAREAN SECTION  12/14/2011   Procedure: CESAREAN SECTION;  Surgeon: Lahoma Crocker, MD;  Location: [facility];  Service: Obstetrics;  Laterality: N/A;  Primary Cesarean Section Delivery Girl @ [phone], Apgars    Current Medications: No outpatient medications have been marked as taking for the 11/29/21 encounter (Appointment) with Belva Crome, MD.     Allergies:   Patient has no known allergies.   Social History   Socioeconomic History   Marital status: Single    Spouse name: Not on file   Number of children: Not on file   Years of education: Not on file   Highest education level: Not on file  Occupational History   Not on file  Tobacco Use   Smoking status: Every Day    Packs/day: 0.50    Types: Cigarettes   Smokeless tobacco: Never   Tobacco comments:    0.5ppd  Vaping Use   Vaping Use: Never used  Substance and Sexual Activity   Alcohol use: Not Currently    Comment: social   Drug use: Never   Sexual activity: Yes  Other Topics Concern    Not on file  Social History Narrative   ** Merged History Encounter **       Social Determinants of Health   Financial Resource Strain: Medium Risk (11/15/2021)   Overall Financial Resource Strain (CARDIA)    Difficulty of Paying Living Expenses: Somewhat hard  Food Insecurity: Food Insecurity Present (02/17/2021)   Hunger Vital Sign    Worried About [place] [employer] in the Last Year: Sometimes true    Ran Out of Food in the Last Year: Sometimes true  Transportation Needs: No Transportation Needs (05/27/2021)   PRAPARE - Hydrologist (Medical): No    Lack of Transportation (Non-Medical): No  Physical Activity: Insufficiently Active (06/28/2021)   Exercise Vital Sign    Days of Exercise per Week: 7 days    Minutes of Exercise per Session: 10 min  Stress: No Stress Concern Present (11/15/2021)   [place]    Feeling of Stress : Only a little  Social Connections: Socially Isolated (10/17/2021)   Social Connection and Isolation Panel [NHANES]    Frequency of Communication with Friends and Family: More than three times a week    Frequency of Social Gatherings with Friends and Family: More than three times a week    Attends Religious Services:  Never    Active Member of Clubs or Organizations: No    Attends Archivist Meetings: Never    Marital Status: Never married     Family History: The patient's family history includes Cancer in her maternal grandmother and paternal grandmother; Diabetes in her brother, mother, and sister; Heart disease in her father; Hypertension in her mother and sister; Stomach cancer in her mother.  ROS:   Please see the history of present illness.    *** All other systems reviewed and are negative.  EKGs/Labs/Other Studies Reviewed:    The following studies were reviewed today: LDL 143 (10/05/21)  EKG:  EKG ***  Recent Labs: No results found  for requested labs within last 365 days.  Recent Lipid Panel    Component Value Date/Time   CHOL 186 08/12/2014 1532   TRIG 202 (H) 08/12/2014 1532   HDL 29 (L) 08/12/2014 1532   CHOLHDL 6.4 (H) 08/12/2014 1532   CHOLHDL 6.2 09/16/2012 1650   VLDL 56 (H) 09/16/2012 1650   LDLCALC 117 (H) 08/12/2014 1532    Physical Exam:    VS:  There were no vitals taken for this visit.    Wt Readings from Last 3 Encounters:  11/08/20 275 lb 9.2 oz (125 kg)  01/27/19 267 lb 6.4 oz (121.3 kg)  11/25/18 267 lb (121.1 kg)     GEN: ***. No acute distress HEENT: Normal NECK: No JVD. LYMPHATICS: No lymphadenopathy CARDIAC: *** murmur. RRR *** gallop, or edema. VASCULAR: *** Normal Pulses. No bruits. RESPIRATORY:  Clear to auscultation without rales, wheezing or rhonchi  ABDOMEN: Soft, non-tender, non-distended, No pulsatile mass, MUSCULOSKELETAL: No deformity  SKIN: Warm and dry NEUROLOGIC:  Alert and oriented x 3 PSYCHIATRIC:  Normal affect   ASSESSMENT:    1. Chest pain of uncertain etiology   2. Tobacco use   3. Primary hypertension   4. Morbid obesity ([place])    PLAN:    In order of problems listed above:  ***   Medication Adjustments/Labs and Tests Ordered: Current medicines are reviewed at length with the patient today.  Concerns regarding medicines are outlined above.  No orders of the defined types were placed in this encounter.  No orders of the defined types were placed in this encounter.   There are no Patient Instructions on file for this visit.   Signed, Sinclair Grooms, MD  11/28/2021 9:24 PM    [place]

## 2021-11-29 ENCOUNTER — Ambulatory Visit: Payer: Medicaid Other | Admitting: Interventional Cardiology

## 2021-11-29 DIAGNOSIS — I10 Essential (primary) hypertension: Secondary | ICD-10-CM

## 2021-11-29 DIAGNOSIS — R079 Chest pain, unspecified: Secondary | ICD-10-CM

## 2021-11-29 DIAGNOSIS — Z720 Tobacco use: Secondary | ICD-10-CM

## 2021-12-02 DIAGNOSIS — Z419 Encounter for procedure for purposes other than remedying health state, unspecified: Secondary | ICD-10-CM | POA: Diagnosis not present

## 2021-12-05 DIAGNOSIS — Z6834 Body mass index (BMI) 34.0-34.9, adult: Secondary | ICD-10-CM | POA: Diagnosis not present

## 2021-12-05 DIAGNOSIS — R079 Chest pain, unspecified: Secondary | ICD-10-CM | POA: Diagnosis not present

## 2021-12-05 DIAGNOSIS — R030 Elevated blood-pressure reading, without diagnosis of hypertension: Secondary | ICD-10-CM | POA: Diagnosis not present

## 2021-12-05 DIAGNOSIS — Z79899 Other long term (current) drug therapy: Secondary | ICD-10-CM | POA: Diagnosis not present

## 2021-12-12 DIAGNOSIS — Z79899 Other long term (current) drug therapy: Secondary | ICD-10-CM | POA: Diagnosis not present

## 2021-12-16 ENCOUNTER — Encounter: Payer: Self-pay | Admitting: Obstetrics and Gynecology

## 2021-12-16 ENCOUNTER — Other Ambulatory Visit: Payer: Medicaid Other | Admitting: Obstetrics and Gynecology

## 2021-12-16 NOTE — Patient Outreach (Signed)
Care Coordination  12/16/2021  EVOLETH NORDMEYER 08-03-1980 [id number]  RNCM called patient at scheduled time-patient answered phone and asked to be called back another time.  [name] RN, BSN [place]  Triad Curator - Managed Medicaid High Risk [phone].

## 2022-01-02 DIAGNOSIS — Z419 Encounter for procedure for purposes other than remedying health state, unspecified: Secondary | ICD-10-CM | POA: Diagnosis not present

## 2022-01-05 DIAGNOSIS — E119 Type 2 diabetes mellitus without complications: Secondary | ICD-10-CM | POA: Diagnosis not present

## 2022-01-05 DIAGNOSIS — Z794 Long term (current) use of insulin: Secondary | ICD-10-CM | POA: Diagnosis not present

## 2022-01-05 DIAGNOSIS — E118 Type 2 diabetes mellitus with unspecified complications: Secondary | ICD-10-CM | POA: Diagnosis not present

## 2022-01-05 DIAGNOSIS — Z6834 Body mass index (BMI) 34.0-34.9, adult: Secondary | ICD-10-CM | POA: Diagnosis not present

## 2022-01-05 DIAGNOSIS — R079 Chest pain, unspecified: Secondary | ICD-10-CM | POA: Diagnosis not present

## 2022-01-05 DIAGNOSIS — Z79899 Other long term (current) drug therapy: Secondary | ICD-10-CM | POA: Diagnosis not present

## 2022-01-05 DIAGNOSIS — R030 Elevated blood-pressure reading, without diagnosis of hypertension: Secondary | ICD-10-CM | POA: Diagnosis not present

## 2022-01-05 DIAGNOSIS — E1165 Type 2 diabetes mellitus with hyperglycemia: Secondary | ICD-10-CM | POA: Diagnosis not present

## 2022-01-09 ENCOUNTER — Emergency Department (HOSPITAL_COMMUNITY)
Admission: EM | Admit: 2022-01-09 | Discharge: 2022-01-10 | Discharge status: Against Medical Advice | Payer: Medicaid Other | Attending: Emergency Medicine | Admitting: Emergency Medicine

## 2022-01-09 DIAGNOSIS — R0789 Other chest pain: Secondary | ICD-10-CM | POA: Diagnosis not present

## 2022-01-09 DIAGNOSIS — R079 Chest pain, unspecified: Secondary | ICD-10-CM | POA: Insufficient documentation

## 2022-01-09 DIAGNOSIS — Z5321 Procedure and treatment not carried out due to patient leaving prior to being seen by health care provider: Secondary | ICD-10-CM | POA: Insufficient documentation

## 2022-01-09 DIAGNOSIS — R0602 Shortness of breath: Secondary | ICD-10-CM | POA: Diagnosis not present

## 2022-01-09 DIAGNOSIS — I4891 Unspecified atrial fibrillation: Secondary | ICD-10-CM | POA: Diagnosis not present

## 2022-01-09 DIAGNOSIS — Z789 Other specified health status: Secondary | ICD-10-CM | POA: Diagnosis not present

## 2022-01-09 NOTE — ED Notes (Signed)
Pt stated that she is leaving and does not want to be seen

## 2022-01-09 NOTE — ED Triage Notes (Signed)
Patient arrived with EMS from home reports central chest pain with mild SOB this evening " pressure" , she received ASA 324 mg and 2 NTG sl prior to arrival .

## 2022-01-09 NOTE — ED Notes (Signed)
IV removed.

## 2022-01-10 DIAGNOSIS — Z79899 Other long term (current) drug therapy: Secondary | ICD-10-CM | POA: Diagnosis not present

## 2022-01-12 ENCOUNTER — Ambulatory Visit: Payer: Medicaid Other | Attending: Cardiology | Admitting: Cardiology

## 2022-01-12 ENCOUNTER — Encounter: Payer: Self-pay | Admitting: Cardiology

## 2022-01-12 ENCOUNTER — Ambulatory Visit (INDEPENDENT_AMBULATORY_CARE_PROVIDER_SITE_OTHER): Payer: Medicaid Other

## 2022-01-12 VITALS — BP 168/100 | HR 92 | Ht 73.0 in | Wt 263.4 lb

## 2022-01-12 DIAGNOSIS — Z01812 Encounter for preprocedural laboratory examination: Secondary | ICD-10-CM | POA: Diagnosis not present

## 2022-01-12 DIAGNOSIS — Z79899 Other long term (current) drug therapy: Secondary | ICD-10-CM | POA: Diagnosis not present

## 2022-01-12 DIAGNOSIS — E119 Type 2 diabetes mellitus without complications: Secondary | ICD-10-CM | POA: Diagnosis not present

## 2022-01-12 DIAGNOSIS — M545 Low back pain, unspecified: Secondary | ICD-10-CM | POA: Diagnosis not present

## 2022-01-12 DIAGNOSIS — E7800 Pure hypercholesterolemia, unspecified: Secondary | ICD-10-CM | POA: Diagnosis not present

## 2022-01-12 DIAGNOSIS — R002 Palpitations: Secondary | ICD-10-CM | POA: Diagnosis not present

## 2022-01-12 DIAGNOSIS — R072 Precordial pain: Secondary | ICD-10-CM | POA: Diagnosis not present

## 2022-01-12 DIAGNOSIS — Z09 Encounter for follow-up examination after completed treatment for conditions other than malignant neoplasm: Secondary | ICD-10-CM | POA: Diagnosis not present

## 2022-01-12 DIAGNOSIS — Z794 Long term (current) use of insulin: Secondary | ICD-10-CM | POA: Diagnosis not present

## 2022-01-12 DIAGNOSIS — R300 Dysuria: Secondary | ICD-10-CM | POA: Diagnosis not present

## 2022-01-12 DIAGNOSIS — R5383 Other fatigue: Secondary | ICD-10-CM | POA: Diagnosis not present

## 2022-01-12 DIAGNOSIS — I10 Essential (primary) hypertension: Secondary | ICD-10-CM | POA: Diagnosis not present

## 2022-01-12 DIAGNOSIS — R0602 Shortness of breath: Secondary | ICD-10-CM | POA: Diagnosis not present

## 2022-01-12 DIAGNOSIS — Z720 Tobacco use: Secondary | ICD-10-CM

## 2022-01-12 DIAGNOSIS — E559 Vitamin D deficiency, unspecified: Secondary | ICD-10-CM | POA: Diagnosis not present

## 2022-01-12 LAB — BASIC METABOLIC PANEL
BUN/Creatinine Ratio: 10 (ref 9–23)
BUN: 7 mg/dL (ref 6–24)
CO2: 23 mmol/L (ref 20–29)
Calcium: 8.9 mg/dL (ref 8.7–10.2)
Chloride: 104 mmol/L (ref 96–106)
Creatinine, Ser: 0.7 mg/dL (ref 0.57–1.00)
Glucose: 143 mg/dL — ABNORMAL HIGH (ref 70–99)
Potassium: 4 mmol/L (ref 3.5–5.2)
Sodium: 140 mmol/L (ref 134–144)
eGFR: 110 mL/min/{1.73_m2} (ref >59)

## 2022-01-12 MED ORDER — METOPROLOL TARTRATE 100 MG PO TABS: 100.0000 mg | ORAL_TABLET | ORAL | 0 refills | Status: DC

## 2022-01-12 NOTE — Progress Notes (Unsigned)
Enrolled patient for a 14 day Zio XT  monitor to be mailed to patients home  °

## 2022-01-12 NOTE — Patient Instructions (Signed)
Medication Instructions:  The current medical regimen is effective;  continue present plan and medications.  *If you need a refill on your cardiac medications before your next appointment, please call your pharmacy*   Lab Work: Please have blood work today (BMP)  If you have labs (blood work) drawn today and your tests are completely normal, you will receive your results only by: MyChart Message (if you have MyChart) OR A paper copy in the mail If you have any lab test that is abnormal or we need to change your treatment, we will call you to review the results.   Testing/Procedures: Your physician has requested that you have an echocardiogram. Echocardiography is a painless test that uses sound waves to create images of your heart. It provides your doctor with information about the size and shape of your heart and how well your heart's chambers and valves are working. This procedure takes approximately one hour. There are no restrictions for this procedure. Please do NOT wear cologne, perfume, aftershave, or lotions (deodorant is allowed). Please arrive 15 minutes prior to your appointment time.  ZIO XT- Long Term Monitor Instructions  Your physician has requested you wear a ZIO patch monitor for 14 days.  This is a single patch monitor. [employer] supplies one patch monitor per enrollment. Additional stickers are not available. Please do not apply patch if you will be having a Nuclear Stress Test,  Echocardiogram, Cardiac CT, MRI, or Chest Xray during the period you would be wearing the  monitor. The patch cannot be worn during these tests. You cannot remove and re-apply the  ZIO XT patch monitor.  Your ZIO patch monitor will be mailed 3 day USPS to your address on file. It may take 3-5 days  to receive your monitor after you have been enrolled.  Once you have received your monitor, please review the enclosed instructions. Your monitor  has already been registered assigning a specific  monitor serial # to you.  Billing and Patient Assistance Program Information  We have supplied [employer] with any of your insurance information on file for billing purposes. [employer] offers a sliding scale Patient Assistance Program for patients that do not have  insurance, or whose insurance does not completely cover the cost of the ZIO monitor.  You must apply for the Patient Assistance Program to qualify for this discounted rate.  To apply, please call [employer] at [phone], select option 4, select option 2, ask to apply for  Patient Assistance Program. Meredeth Ide will ask your household income, and how many people  are in your household. They will quote your out-of-pocket cost based on that information.  [employer] will also be able to set up a 60-month, interest-free payment plan if needed.  Applying the monitor   Shave hair from upper left chest.  Hold abrader disc by orange tab. Rub abrader in 40 strokes over the upper left chest as  indicated in your monitor instructions.  Clean area with 4 enclosed alcohol pads. Let dry.  Apply patch as indicated in monitor instructions. Patch will be placed under collarbone on left  side of chest with arrow pointing upward.  Rub patch adhesive wings for 2 minutes. Remove white label marked "1". Remove the white  label marked "2". Rub patch adhesive wings for 2 additional minutes.  While looking in a mirror, press and release button in center of patch. A small green light will  flash 3-4 times. This will be your only indicator that the monitor has been turned  on.  Do not shower for the first 24 hours. You may shower after the first 24 hours.  Press the button if you feel a symptom. You will hear a small click. Record Date, Time and  Symptom in the Patient Logbook.  When you are ready to remove the patch, follow instructions on the last 2 pages of Patient  Logbook. Stick patch monitor onto the last page of Patient Logbook.  Place Patient Logbook in the  blue and white box. Use locking tab on box and tape box closed  securely. The blue and white box has prepaid postage on it. Please place it in the mailbox as  soon as possible. Your physician should have your test results approximately 7 days after the  monitor has been mailed back to [facility].  Call [place] at [phone] if you have questions regarding  your ZIO XT patch monitor. Call them immediately if you see an orange light blinking on your  monitor.  If your monitor falls off in less than 4 days, contact our Monitor department at [phone].  If your monitor becomes loose or falls off after 4 days call [employer] at [phone] for  suggestions on securing your monitor    Your cardiac CT will be scheduled at:   [facility], [place] [address] [phone]  ease arrive at the [facility] and Children's Entrance (Entrance C2) of [facility] 30 minutes prior to test start time. You can use the FREE valet parking offered at entrance C (encouraged to control the heart rate for the test)  Proceed to the [facility] Radiology Department (first floor) to check-in and test prep.  All radiology patients and guests should use entrance C2 at [facility], accessed from [facility], even though the hospital's physical address listed is [address].     Please follow these instructions carefully (unless otherwise directed):   On the Night Before the Test: Be sure to Drink plenty of water. Do not consume any caffeinated/decaffeinated beverages or chocolate 12 hours prior to your test. Do not take any antihistamines 12 hours prior to your test.  On the Day of the Test: Drink plenty of water until 1 hour prior to the test. Do not eat any food 1 hour prior to test. You may take your regular medications prior to the test.  Take metoprolol (Lopressor) two hours prior to test. HOLD  Furosemide/Hydrochlorothiazide morning of the test. FEMALES- please wear underwire-free bra if available, avoid dresses & tight clothing      After the Test: Drink plenty of water. After receiving IV contrast, you may experience a mild flushed feeling. This is normal. On occasion, you may experience a mild rash up to 24 hours after the test. This is not dangerous. If this occurs, you can take Benadryl 25 mg and increase your fluid intake. If you experience trouble breathing, this can be serious. If it is severe call 911 IMMEDIATELY. If it is mild, please call our office. If you take any of these medications: Glipizide/Metformin, Avandament, Glucavance, please do not take 48 hours after completing test unless otherwise instructed.  We will call to schedule your test 2-4 weeks out understanding that some insurance companies will need an authorization prior to the service being performed.   For non-scheduling related questions, please contact the cardiac imaging nurse navigator should you have any questions/concerns: Marchia Bond, Cardiac Imaging Nurse Navigator Gordy Clement, Cardiac Imaging Nurse Navigator  [facility] Direct Office Dial: [phone]   For scheduling needs, including cancellations and rescheduling, please call [place], [phone].   Follow-Up: At [facility], you and your health needs are our priority.  As part of our continuing mission to provide you with exceptional heart care, we have created designated Provider Care Teams.  These Care Teams include your primary Cardiologist (physician) and Advanced Practice Providers (APPs -  Physician Assistants and Nurse Practitioners) who all work together to provide you with the care you need, when you need it.  We recommend signing up for the patient portal called "MyChart".  Sign up information is provided on this After Visit Summary.  MyChart is used to connect with patients for Virtual  Visits (Telemedicine).  Patients are able to view lab/test results, encounter notes, upcoming appointments, etc.  Non-urgent messages can be sent to your provider as well.   To learn more about what you can do with MyChart, go to [email or web address].    Your next appointment:   Follow up will be based on the results of the above testing.

## 2022-01-12 NOTE — Progress Notes (Signed)
Cardiology Office Note:    Date:  01/12/2022   ID:  SANVIKA CUTTINO, DOB 04-28-1980, MRN [id number]  PCP:  [facility] [place] Providers Cardiologist:  None     Referring MD: Trey Sailors, PA    History of Present Illness:    Misty [name] is a 42 y.o. female here for evaluation of coronary artery disease at the request of [facility] Number, [place].  She has hypertension diabetes tobacco use she has previously seen a cardiologist in the past. Went because HR would speed up perhaps because of asthma pump. Can just speed up. Sitting down. No syncope.   Chest pain, ER recently with pain. Off and on for a while.  Her chest discomfort can come and go be intermittent.  Moderate severity at times.  Past Medical History:  Diagnosis Date   Chest pain    multiple ED evaluations, most likely musculoskeletal (cervical radiculopathy), needs MRI spine   Diabetes mellitus    Diabetes mellitus without complication ([place])    Diverticulitis 2019   GERD (gastroesophageal reflux disease)    Gestational diabetes    Hidradenitis suppurativa 01/18/2018   Hypertension    PCOD (polycystic ovarian disease)    Followed by [facility], has been on metformin   PCOS (polycystic ovarian syndrome)     Past Surgical History:  Procedure Laterality Date   CESAREAN SECTION  12/14/2011   Procedure: CESAREAN SECTION;  Surgeon: Lahoma Crocker, MD;  Location: [facility];  Service: Obstetrics;  Laterality: N/A;  Primary Cesarean Section Delivery Girl @ [phone], Apgars    Current Medications: Current Meds  Medication Sig   diclofenac sodium (VOLTAREN) 1 % GEL Apply 2 g topically 4 (four) times daily.   diltiazem (TIAZAC) 180 MG 24 hr capsule Take 180 mg by mouth daily.   Dulaglutide (TRULICITY) 2.54 YH/0.6CB SOPN Inject into the skin.   LANTUS SOLOSTAR 100 UNIT/ML Solostar Pen SMARTSIG:10 Unit(s) SUB-Q Morning-Night   lisinopril (ZESTRIL) 10 MG  tablet Take 10 mg by mouth daily.   metoprolol tartrate (LOPRESSOR) 100 MG tablet Take 1 tablet (100 mg total) by mouth as directed. Take 1 tablet (2) hours before your CT scan   oxyCODONE-acetaminophen (PERCOCET) 10-325 MG tablet Take 1 tablet by mouth 4 (four) times daily as needed for pain.   pioglitazone (ACTOS) 30 MG tablet Take 1 tablet (30 mg total) by mouth daily.   prazosin (MINIPRESS) 2 MG capsule Take 2 mg by mouth at bedtime.   pregabalin (LYRICA) 75 MG capsule Take 75 mg by mouth 2 (two) times daily.   Vitamin D, Ergocalciferol, (DRISDOL) 1.25 MG (50000 UNIT) CAPS capsule Take 50,000 Units by mouth once a week.     Allergies:   Patient has no known allergies.   Social History   Socioeconomic History   Marital status: Single    Spouse name: Not on file   Number of children: Not on file   Years of education: Not on file   Highest education level: Not on file  Occupational History   Not on file  Tobacco Use   Smoking status: Every Day    Packs/day: 0.50    Types: Cigarettes   Smokeless tobacco: Never   Tobacco comments:    0.5ppd  Vaping Use   Vaping Use: Never used  Substance and Sexual Activity   Alcohol use: Not Currently    Comment: social   Drug use: Never   Sexual activity: Yes  Other Topics Concern   Not on file  Social History Narrative   ** Merged History Encounter **       Social Determinants of Health   Financial Resource Strain: Medium Risk (11/15/2021)   Overall Financial Resource Strain (CARDIA)    Difficulty of Paying Living Expenses: Somewhat hard  Food Insecurity: Food Insecurity Present (02/17/2021)   Hunger Vital Sign    Worried About Running Out of Food in the Last Year: Sometimes true    Ran Out of Food in the Last Year: Sometimes true  Transportation Needs: No Transportation Needs (12/16/2021)   PRAPARE - Hydrologist (Medical): No    Lack of Transportation (Non-Medical): No  Physical Activity: Unknown  (06/28/2021)   Exercise Vital Sign    Days of Exercise per Week: 7 days    Minutes of Exercise per Session: Not on file  Recent Concern: Physical Activity - Insufficiently Active (06/28/2021)   Exercise Vital Sign    Days of Exercise per Week: 7 days    Minutes of Exercise per Session: 10 min  Stress: No Stress Concern Present (11/15/2021)   [place]    Feeling of Stress : Only a little  Social Connections: Socially Isolated (10/17/2021)   Social Connection and Isolation Panel [NHANES]    Frequency of Communication with Friends and Family: More than three times a week    Frequency of Social Gatherings with Friends and Family: More than three times a week    Attends Religious Services: Never    Marine scientist or Organizations: No    Attends Music therapist: Never    Marital Status: Never married     Family History: The patient's family history includes Cancer in her maternal grandmother and paternal grandmother; Diabetes in her brother, mother, and sister; Heart disease in her father; Hypertension in her mother and sister; Stomach cancer in her mother.  ROS:   Please see the history of present illness.     All other systems reviewed and are negative.  EKGs/Labs/Other Studies Reviewed:    The following studies were reviewed today: Prior office notes reviewed, lab work reviewed, EKGs reviewed, ER visits reviewed.  Troponin was normal in the past.  EKG:  The ekg ordered today demonstrates 01/12/2022-normal sinus rhythm 92 with no other abnormalities.  Recent Labs: No results found for requested labs within last 365 days.  Recent Lipid Panel    Component Value Date/Time   CHOL 186 08/12/2014 1532   TRIG 202 (H) 08/12/2014 1532   HDL 29 (L) 08/12/2014 1532   CHOLHDL 6.4 (H) 08/12/2014 1532   CHOLHDL 6.2 09/16/2012 1650   VLDL 56 (H) 09/16/2012 1650   LDLCALC 117 (H) 08/12/2014 1532      Risk Assessment/Calculations:              Physical Exam:    VS:  BP (!) 168/100   Pulse 92   Ht 6\' 1"  (1.854 m)   Wt 263 lb 6.4 oz (119.5 kg)   SpO2 99%   BMI 34.75 kg/m     Wt Readings from Last 3 Encounters:  01/12/22 263 lb 6.4 oz (119.5 kg)  11/08/20 275 lb 9.2 oz (125 kg)  01/27/19 267 lb 6.4 oz (121.3 kg)     GEN:  Well nourished, well developed in no acute distress HEENT: Normal NECK: No JVD; No carotid bruits LYMPHATICS: No lymphadenopathy CARDIAC: RRR, no murmurs,  rubs, gallops RESPIRATORY:  Clear to auscultation without rales, wheezing or rhonchi  ABDOMEN: Soft, non-tender, non-distended MUSCULOSKELETAL:  No edema; No deformity  SKIN: Warm and dry NEUROLOGIC:  Alert and oriented x 3 PSYCHIATRIC:  Normal affect   ASSESSMENT:    1. Precordial chest pain   2. Heart palpitations   3. Diabetes mellitus with coincident hypertension (HCC)   4. Tobacco use   5. Pre-procedure lab exam    PLAN:    In order of problems listed above:  Precordial chest pain - With her diabetes hypertension smoking history and previous multiple visits to the emergency department, we will go ahead and check a coronary CT scan to evaluate her coronary anatomy. -I will check an echocardiogram to ensure proper structure and function of her heart.  Hopefully if reassuring, this will help  Palpitations - Heart feels like it races sometimes when she is at rest in the evening.  We will check a Zio patch monitor to evaluate. - She is currently on diltiazem 180 mg daily CD.  We could always contemplate increasing that to 360 mg a day to help suppress her more rapid heartbeat.  Hypertension/diabetes/smoking Continue to modify these risk factors.  Prior hemoglobin A1c 9.9 creatinine 0.6  We will follow-up with results of studies         Medication Adjustments/Labs and Tests Ordered: Current medicines are reviewed at length with the patient today.  Concerns regarding medicines  are outlined above.  Orders Placed This Encounter  Procedures   CT CORONARY MORPH W/CTA COR W/SCORE W/CA W/CM &/OR WO/CM   Basic metabolic panel   LONG TERM MONITOR (3-14 DAYS)   EKG 12-Lead   ECHOCARDIOGRAM COMPLETE   Meds ordered this encounter  Medications   metoprolol tartrate (LOPRESSOR) 100 MG tablet    Sig: Take 1 tablet (100 mg total) by mouth as directed. Take 1 tablet (2) hours before your CT scan    Dispense:  1 tablet    Refill:  0    Patient Instructions  Medication Instructions:  The current medical regimen is effective;  continue present plan and medications.  *If you need a refill on your cardiac medications before your next appointment, please call your pharmacy*   Lab Work: Please have blood work today (BMP)  If you have labs (blood work) drawn today and your tests are completely normal, you will receive your results only by: MyChart Message (if you have MyChart) OR A paper copy in the mail If you have any lab test that is abnormal or we need to change your treatment, we will call you to review the results.   Testing/Procedures: Your physician has requested that you have an echocardiogram. Echocardiography is a painless test that uses sound waves to create images of your heart. It provides your doctor with information about the size and shape of your heart and how well your heart's chambers and valves are working. This procedure takes approximately one hour. There are no restrictions for this procedure. Please do NOT wear cologne, perfume, aftershave, or lotions (deodorant is allowed). Please arrive 15 minutes prior to your appointment time.  ZIO XT- Long Term Monitor Instructions  Your physician has requested you wear a ZIO patch monitor for 14 days.  This is a single patch monitor. [employer] supplies one patch monitor per enrollment. Additional stickers are not available. Please do not apply patch if you will be having a Nuclear Stress Test,   Echocardiogram, Cardiac CT, MRI, or Chest Xray during  the period you would be wearing the  monitor. The patch cannot be worn during these tests. You cannot remove and re-apply the  ZIO XT patch monitor.  Your ZIO patch monitor will be mailed 3 day USPS to your address on file. It may take 3-5 days  to receive your monitor after you have been enrolled.  Once you have received your monitor, please review the enclosed instructions. Your monitor  has already been registered assigning a specific monitor serial # to you.  Billing and Patient Assistance Program Information  We have supplied [employer] with any of your insurance information on file for billing purposes. [employer] offers a sliding scale Patient Assistance Program for patients that do not have  insurance, or whose insurance does not completely cover the cost of the ZIO monitor.  You must apply for the Patient Assistance Program to qualify for this discounted rate.  To apply, please call [employer] at [phone], select option 4, select option 2, ask to apply for  Patient Assistance Program. Meredeth Ide will ask your household income, and how many people  are in your household. They will quote your out-of-pocket cost based on that information.  [employer] will also be able to set up a 32-month, interest-free payment plan if needed.  Applying the monitor   Shave hair from upper left chest.  Hold abrader disc by orange tab. Rub abrader in 40 strokes over the upper left chest as  indicated in your monitor instructions.  Clean area with 4 enclosed alcohol pads. Let dry.  Apply patch as indicated in monitor instructions. Patch will be placed under collarbone on left  side of chest with arrow pointing upward.  Rub patch adhesive wings for 2 minutes. Remove white label marked "1". Remove the white  label marked "2". Rub patch adhesive wings for 2 additional minutes.  While looking in a mirror, press and release button in center of patch. A small  green light will  flash 3-4 times. This will be your only indicator that the monitor has been turned on.  Do not shower for the first 24 hours. You may shower after the first 24 hours.  Press the button if you feel a symptom. You will hear a small click. Record Date, Time and  Symptom in the Patient Logbook.  When you are ready to remove the patch, follow instructions on the last 2 pages of Patient  Logbook. Stick patch monitor onto the last page of Patient Logbook.  Place Patient Logbook in the blue and white box. Use locking tab on box and tape box closed  securely. The blue and white box has prepaid postage on it. Please place it in the mailbox as  soon as possible. Your physician should have your test results approximately 7 days after the  monitor has been mailed back to [facility].  Call [facility] Customer Care at [phone] if you have questions regarding  your ZIO XT patch monitor. Call them immediately if you see an orange light blinking on your  monitor.  If your monitor falls off in less than 4 days, contact our Monitor department at [phone].  If your monitor becomes loose or falls off after 4 days call [employer] at [phone] for  suggestions on securing your monitor    Your cardiac CT will be scheduled at:   [facility] [address] [place], [place] [address] [phone]  ease arrive at the [facility] and Children's Entrance (Entrance C2) of [facility] 30 minutes prior  to test start time. You can use the FREE valet parking offered at entrance C (encouraged to control the heart rate for the test)  Proceed to the [facility] Radiology Department (first floor) to check-in and test prep.  All radiology patients and guests should use entrance C2 at [facility], accessed from [facility], even though the hospital's physical address listed is [address].     Please follow these instructions carefully  (unless otherwise directed):   On the Night Before the Test: Be sure to Drink plenty of water. Do not consume any caffeinated/decaffeinated beverages or chocolate 12 hours prior to your test. Do not take any antihistamines 12 hours prior to your test.  On the Day of the Test: Drink plenty of water until 1 hour prior to the test. Do not eat any food 1 hour prior to test. You may take your regular medications prior to the test.  Take metoprolol (Lopressor) two hours prior to test. HOLD Furosemide/Hydrochlorothiazide morning of the test. FEMALES- please wear underwire-free bra if available, avoid dresses & tight clothing      After the Test: Drink plenty of water. After receiving IV contrast, you may experience a mild flushed feeling. This is normal. On occasion, you may experience a mild rash up to 24 hours after the test. This is not dangerous. If this occurs, you can take Benadryl 25 mg and increase your fluid intake. If you experience trouble breathing, this can be serious. If it is severe call 911 IMMEDIATELY. If it is mild, please call our office. If you take any of these medications: Glipizide/Metformin, Avandament, Glucavance, please do not take 48 hours after completing test unless otherwise instructed.  We will call to schedule your test 2-4 weeks out understanding that some insurance companies will need an authorization prior to the service being performed.   For non-scheduling related questions, please contact the cardiac imaging nurse navigator should you have any questions/concerns: Rockwell Alexandria, Cardiac Imaging Nurse Navigator Larey Brick, Cardiac Imaging Nurse Navigator [facility] Direct Office Dial: [phone]   For scheduling needs, including cancellations and rescheduling, please call [place], [phone].   Follow-Up: At [facility] you and your health needs are our priority.  As part of our continuing mission to  provide you with exceptional heart care, we have created designated Provider Care Teams.  These Care Teams include your primary Cardiologist (physician) and Advanced Practice Providers (APPs -  Physician Assistants and Nurse Practitioners) who all work together to provide you with the care you need, when you need it.  We recommend signing up for the patient portal called "MyChart".  Sign up information is provided on this After Visit Summary.  MyChart is used to connect with patients for Virtual Visits (Telemedicine).  Patients are able to view lab/test results, encounter notes, upcoming appointments, etc.  Non-urgent messages can be sent to your provider as well.   To learn more about what you can do with MyChart, go to [email or web address].    Your next appointment:   Follow up will be based on the results of the above testing.     Signed, Donato Schultz, MD  01/12/2022 12:36 PM    [facility]

## 2022-01-13 ENCOUNTER — Other Ambulatory Visit: Payer: Self-pay | Admitting: Cardiology

## 2022-01-13 DIAGNOSIS — R030 Elevated blood-pressure reading, without diagnosis of hypertension: Secondary | ICD-10-CM | POA: Diagnosis not present

## 2022-01-13 DIAGNOSIS — I10 Essential (primary) hypertension: Secondary | ICD-10-CM | POA: Diagnosis not present

## 2022-01-13 DIAGNOSIS — R5383 Other fatigue: Secondary | ICD-10-CM | POA: Diagnosis not present

## 2022-01-13 DIAGNOSIS — R0602 Shortness of breath: Secondary | ICD-10-CM | POA: Diagnosis not present

## 2022-01-13 DIAGNOSIS — E559 Vitamin D deficiency, unspecified: Secondary | ICD-10-CM | POA: Diagnosis not present

## 2022-01-13 DIAGNOSIS — E119 Type 2 diabetes mellitus without complications: Secondary | ICD-10-CM | POA: Diagnosis not present

## 2022-01-13 DIAGNOSIS — R300 Dysuria: Secondary | ICD-10-CM | POA: Diagnosis not present

## 2022-01-13 DIAGNOSIS — M545 Low back pain, unspecified: Secondary | ICD-10-CM | POA: Diagnosis not present

## 2022-01-13 DIAGNOSIS — Z3200 Encounter for pregnancy test, result unknown: Secondary | ICD-10-CM | POA: Diagnosis not present

## 2022-01-13 DIAGNOSIS — Z6834 Body mass index (BMI) 34.0-34.9, adult: Secondary | ICD-10-CM | POA: Diagnosis not present

## 2022-01-13 DIAGNOSIS — Z794 Long term (current) use of insulin: Secondary | ICD-10-CM | POA: Diagnosis not present

## 2022-01-13 DIAGNOSIS — E7800 Pure hypercholesterolemia, unspecified: Secondary | ICD-10-CM | POA: Diagnosis not present

## 2022-01-16 ENCOUNTER — Telehealth (HOSPITAL_COMMUNITY): Payer: Self-pay | Admitting: Emergency Medicine

## 2022-01-16 DIAGNOSIS — F17210 Nicotine dependence, cigarettes, uncomplicated: Secondary | ICD-10-CM | POA: Diagnosis not present

## 2022-01-16 DIAGNOSIS — R0602 Shortness of breath: Secondary | ICD-10-CM | POA: Diagnosis not present

## 2022-01-16 NOTE — Telephone Encounter (Signed)
Reaching out to patient to offer assistance regarding upcoming cardiac imaging study; pt verbalizes understanding of appt date/time, parking situation and where to check in, pre-test NPO status and medications ordered, and verified current allergies; name and call back number provided for further questions should they arise Misty Bond RN [facility] [phone] office [phone] cell  Pt states she's not started taking the 100mg  metoprolol tart BID yet but plans to pick up from pharm today and start today. Asked her to take AM dose on wed 2 hr prior to appt Arrival [address] entrance Denies iv issues 100mg  metop tart 2 hr prior

## 2022-01-18 ENCOUNTER — Ambulatory Visit (HOSPITAL_COMMUNITY)
Admission: RE | Admit: 2022-01-18 | Discharge: 2022-01-18 | Disposition: A | Discharge status: Home / Self Care | Payer: Medicaid Other | Source: Ambulatory Visit | Attending: Cardiology | Admitting: Cardiology

## 2022-01-18 DIAGNOSIS — R072 Precordial pain: Secondary | ICD-10-CM

## 2022-01-18 MED ORDER — NITROGLYCERIN 0.4 MG SL SUBL
SUBLINGUAL_TABLET | SUBLINGUAL | Status: AC
  Filled 2022-01-18: qty 2

## 2022-01-18 MED ORDER — DILTIAZEM HCL 25 MG/5ML IV SOLN
INTRAVENOUS | Status: AC
  Administered 2022-01-18: 5 mg via INTRAVENOUS
  Filled 2022-01-18: qty 5

## 2022-01-18 MED ORDER — METOPROLOL TARTRATE 5 MG/5ML IV SOLN
5.0000 mg | INTRAVENOUS | Status: DC | PRN
  Administered 2022-01-18 (×2): 5 mg via INTRAVENOUS

## 2022-01-18 MED ORDER — IOHEXOL 350 MG/ML SOLN
100.0000 mL | Freq: Once | INTRAVENOUS | Status: AC | PRN
  Administered 2022-01-18: 100 mL via INTRAVENOUS

## 2022-01-18 MED ORDER — METOPROLOL TARTRATE 5 MG/5ML IV SOLN
INTRAVENOUS | Status: AC
  Filled 2022-01-18: qty 5

## 2022-01-18 MED ORDER — DILTIAZEM HCL 25 MG/5ML IV SOLN
5.0000 mg | INTRAVENOUS | Status: DC | PRN
  Administered 2022-01-18: 5 mg via INTRAVENOUS

## 2022-01-18 MED ORDER — NITROGLYCERIN 0.4 MG SL SUBL
0.8000 mg | SUBLINGUAL_TABLET | Freq: Once | SUBLINGUAL | Status: AC
Start: 2022-01-18 — End: 2022-01-18
  Administered 2022-01-18: 0.8 mg via SUBLINGUAL

## 2022-01-19 ENCOUNTER — Other Ambulatory Visit: Payer: Medicaid Other | Admitting: Obstetrics and Gynecology

## 2022-01-19 ENCOUNTER — Encounter: Payer: Self-pay | Admitting: Obstetrics and Gynecology

## 2022-01-19 DIAGNOSIS — R002 Palpitations: Secondary | ICD-10-CM | POA: Diagnosis not present

## 2022-01-19 NOTE — Patient Instructions (Signed)
Hi [name], it was nice to catch up today-I am glad we were able to talk-have a great rest of the day!!  [name] was given information about Medicaid Managed Care team care coordination services as a part of their [facility] Medicaid benefit. [name] verbally consented to engagement with the [facility] Managed Care team.   If you are experiencing a medical emergency, please call 911 or report to your local emergency department or urgent care.   If you have a non-emergency medical problem during routine business hours, please contact your provider's office and ask to speak with a nurse.   For questions related to your [facility] health plan, please call: [phone] or go here:[email or web address]  If you would like to schedule transportation through your [facility] plan, please call the following number at least 2 days in advance of your appointment: [phone].  You can also use the MTM portal or MTM mobile app to manage your rides. For the portal, please go to [email or web address]  Call the [facility] Crisis Line at [phone], at any time, 24 hours a day, 7 days a week. If you are in danger or need immediate medical attention call 911.  If you would like help to quit smoking, call 1-800-QUIT-NOW (6162907402) OR Espaol: 1-855-Djelo-Ya (1-761-607-3710) o para ms informacin haga clic aqu or Text READY to 626-948 to register via text  [name] - following are the goals we discussed in your visit today:   Goals Addressed             This Visit's Progress    Protect My Health       Timeframe:  Long-Range Goal Priority:  High Start Date:      06/23/20                       Expected End Date:      ongoing                 Follow Up Date: 02/15/22   - schedule appointment for flu shot - schedule appointment for vaccines needed due to my age or health - schedule recommended health tests (blood work,  mammogram, colonoscopy, pap test) - schedule and keep appointment for annual check-up    Why is this important?   Screening tests can find diseases early when they are easier to treat.  Your doctor or nurse will talk with you about which tests are important for you.  Getting shots for common diseases like the flu and shingles will help prevent them.   01/19/22:  Patient seen and evaluated by CARDS 01/12/22.  Has appt with PCP tomorrow.   Patient verbalizes understanding of instructions and care plan provided today and agrees to view in MyChart. Active MyChart status and patient understanding of how to access instructions and care plan via MyChart confirmed with patient.     The Managed Medicaid care management team will reach out to the patient again over the next 30 business  days.  The  Patient   has been provided with contact information for the Managed Medicaid care management team and has been advised to call with any health related questions or concerns.   Kathi Der RN, BSN [place]  [facility] Care Management Coordinator - Managed Medicaid High Risk [phone]   Following is a copy of your plan of care:  Care Plan : General Plan of Care (Adult)  Updates made by Taleen Prosser, Calvert Cantor, RN since  01/19/2022 12:00 AM     Problem: Health Promotion or Disease Self-Management (General Plan of Care)   Priority: High  Onset Date: 01/19/2020     Long-Range Goal: Self-Management Plan Developed   Start Date: 01/19/2020  Expected End Date: 04/20/2022  Recent Progress: Not on track  Priority: High  Note:   Current Barriers:  Knowledge Deficits related to medications. Chronic Disease Management support and education needs. Patient with DM2, HTN, tobacco use, anxiety, chronic pain, headaches. Patient given 1-800-quit now phone number. 01/19/22:  Patient seen and evaluated by CARDS 1/11 and has appt with PCP tomorrow.  Patient starting to wear Zio monitor today.  No change in  smoking-1/2 ppd.  Patient requests Psychiatry referral-LCSW appt scheduled.  Nurse Case Manager Clinical Goal(s):  Over the next 90 days, patient will attend all scheduled medical appointments: Over the next 90 days, patient will work with CM team pharmacist to review medications. Over the next 90 days, patient will work with Psychiatry to address anxiety. Over the next 30 days, patient will schedule  an appointment  with a PCP and get new meter. Over the next 30 days, patient will take all of her medications as directed.  Interventions:  Inter-disciplinary care team collaboration (see longitudinal plan of care) Evaluation of current treatment plan and patient's adherence to plan as established by provider. Reviewed medications with patient. Collaborated with pharmacy and social work. Discussed plans with patient for ongoing care management follow up and provided patient with direct contact information for care management team Reviewed scheduled/upcoming provider appointments Collaborated with BSW for Podiatry provider  and eye provider, utility resources BSW referral for Podiatrist and eye provider, utility resources-completed Social Work referral for anxiety, resources for light bill-completed. Update 06/23/20:  patient has met with Pharmacist and Social Worker-continues to follow.  Care Guide referral for PCP resources-completed. Collaborated with Care Guide for resources LCSW referral for anxiety/depression-patient requests a Psychiatry referral Collaborated with LCSW  Diabetes Interventions:  (Status:  New goal.) Long Term Goal Assessed patient's understanding of A1c goal: <7% Reviewed medications with patient and discussed importance of medication adherence Counseled on importance of regular laboratory monitoring as prescribed Discussed plans with patient for ongoing care management follow up and provided patient with direct contact information for care management team Reviewed  scheduled/upcoming provider appointments  Advised patient, providing education and rationale, to check cbg as directed  and record, calling provider  for findings outside established parameters Referral made to social work team for assistance with anxiety/depression/appt Review of patient status, including review of consultants reports, relevant laboratory and other test results, and medications completed Screening for signs and symptoms of depression related to chronic disease state  Assessed social determinant of health barriers Lab Results  Component Value Date   HGBA1C 9.9 (A) 09/18/2018       HGBA1C                                                       9,9                                   15/5/24  Hypertension Interventions:  (Status:  New goal.) Long Term Goal Last practice recorded BP readings:  BP Readings from Last 3 Encounters:  01/18/22  137/74  01/12/22 (!) 168/100  11/08/20 (!) 146/83   Most recent eGFR/CrCl:  Lab Results  Component Value Date   EGFR 110 01/12/2022    No components found for: "CRCL"  Evaluation of current treatment plan related to hypertension self management and patient's adherence to plan as established by provider Reviewed medications with patient and discussed importance of compliance Discussed plans with patient for ongoing care management follow up and provided patient with direct contact information for care management team Advised patient, providing education and rationale, to monitor blood pressure daily and record, calling PCP for findings outside established parameters Reviewed scheduled/upcoming provider appointments including:  Screening for signs and symptoms of depression related to chronic disease state  Assessed social determinant of health barriers   Patient Goals/Self-Care Activities Over the next 30 days, patient will:  -Attends all scheduled provider appointments Calls pharmacy for medication refills Calls provider office for new  concerns or questions Obtain new CBG meter and check blood sugars Take all medications  Follow Up Plan: The Managed Medicaid care management team will reach out to the patient again over the next 30 business days.  The patient has been provided with contact information for the Managed Medicaid care management team and has been advised to call with any health related questions or concerns.

## 2022-01-19 NOTE — Patient Outreach (Signed)
Medicaid Managed Care   Nurse Care Manager Note  01/19/2022 Name:  LAKINDRA WIBLE MRN:  [id number] DOB:  1980/08/22  [name] is an 42 y.o. year old female who is a primary patient of [facility].  The [facility] Managed Care Coordination team was consulted for assistance with:    Chronic healthcare management needs, tobacco use, anxiety, DM, chronic pain, HTN, H/A, carpal tunnel , depression  [name] was given information about Medicaid Managed Care Coordination team services today. [name] Patient agreed to services and verbal consent obtained.  Engaged with patient by telephone for follow up visit in response to provider referral for case management and/or care coordination services.   Assessments/Interventions:  Review of past medical history, allergies, medications, health status, including review of consultants reports, laboratory and other test data, was performed as part of comprehensive evaluation and provision of chronic care management services.  SDOH (Social Determinants of Health) assessments and interventions performed: SDOH Interventions    Flowsheet Row Patient Outreach Telephone from 01/19/2022 in [place] POPULATION HEALTH DEPARTMENT Patient Outreach Telephone from 12/16/2021 in [place] POPULATION HEALTH DEPARTMENT Patient Outreach Telephone from 11/15/2021 in [place] POPULATION HEALTH DEPARTMENT Patient Outreach Telephone from 09/12/2021 in [facility] Care Coordination Patient Outreach Telephone from 06/28/2021 in [facility] Care Coordination Patient Outreach Telephone from 05/27/2021 in [facility] Care Coordination  SDOH Interventions        Food Insecurity Interventions Intervention Not Indicated -- -- -- -- --  Housing Interventions -- Intervention Not Indicated -- -- Intervention Not Indicated --  Transportation Interventions  -- Intervention Not Indicated -- -- -- --  Utilities Interventions -- -- -- Intervention Not Indicated -- --  Depression Interventions/Treatment  Referral to Psychiatry  [LCSW referral placed  to assist with appt] -- -- -- -- --  Financial Strain Interventions -- -- Other (Comment)  [referrals have been placed] -- -- --  Physical Activity Interventions -- -- -- -- Intervention Not Indicated, Other (Comments)  [not interested.] --  Stress Interventions -- -- Intervention Not Indicated -- -- Intervention Not Indicated     Care Plan  No Known Allergies  Medications Reviewed Today     Reviewed by [name], RN (Registered Nurse) on 01/19/22 at 1556  Med List Status: <None>   Medication Order Taking? Sig Documenting Provider Last Dose Status Informant  Accu-Chek FastClix Lancets MISC [id number] No Use twice per day to check your blood glucose.  E11.65  Patient not taking: Reported on 05/27/2021   [name], MD Not Taking Active Self  diclofenac sodium (VOLTAREN) 1 % GEL [id number] No Apply 2 g topically 4 (four) times daily. Caccavale, Sophia, PA-C Taking Active   diltiazem (TIAZAC) 180 MG 24 hr capsule [id number] No Take 180 mg by mouth daily. [provider] Taking Active   Dulaglutide (TRULICITY) 0.75 MG/0.5ML SOPN [id number] No Inject into the skin. [provider] Taking Active   DULoxetine (CYMBALTA) 30 MG capsule [id number] No Take 1 capsule (30 mg total) by mouth at bedtime. Take 1 tab nightly x 1 week then 2 tabs nightly- for nerve pain Lovorn, Megan, MD Taking Expired 02/08/21 2359   gabapentin (NEURONTIN) 300 MG capsule [id number] No Take 300 mg by mouth at bedtime.  Patient not taking: Reported on 02/08/2021   [provider] Not Taking Active            Med Note Revonda Standard, [name]   Sun  Oct 06, 2018  8:29 AM)    glucose blood (ACCU-CHEK GUIDE) test strip [id number] No Use one strip, twice daily to check your blood glucose.  E11.65  Patient not taking:  Reported on 05/27/2021   [name], MD Not Taking Active Self  LANTUS SOLOSTAR 100 UNIT/ML Solostar Pen [id number] No SMARTSIG:10 Unit(s) SUB-Q Morning-Night [provider] Taking Active   lisinopril (ZESTRIL) 10 MG tablet [id number] No Take 10 mg by mouth daily. [provider] Taking Active   meloxicam (MOBIC) 7.5 MG tablet [id number] No meloxicam 7.5 mg tablet  TAKE 1 TABLET BY MOUTH DAILY  Patient not taking: Reported on 01/12/2022   [provider] Not Taking Active   methocarbamol (ROBAXIN) 500 MG tablet [id number] No Take 500 mg by mouth 4 (four) times daily.  Patient not taking: Reported on 10/14/2020   [provider] Not Taking Active   metoprolol tartrate (LOPRESSOR) 100 MG tablet [id number]  Take 1 tablet (100 mg total) by mouth 2 (two) times daily. [name], MD  Active   oxyCODONE-acetaminophen (PERCOCET) 10-325 MG tablet [id number] No Take 1 tablet by mouth 4 (four) times daily as needed for pain. [provider] Taking Active   pioglitazone (ACTOS) 30 MG tablet [id number] No Take 1 tablet (30 mg total) by mouth daily. [name], MD Taking Active   prazosin (MINIPRESS) 2 MG capsule [id number] No Take 2 mg by mouth at bedtime. [provider] Taking Active   predniSONE (DELTASONE) 5 MG tablet [id number] No Take by mouth daily with breakfast.  Patient not taking: Reported on 07/08/2020   [provider] Not Taking Active   pregabalin (LYRICA) 75 MG capsule [id number] No Take 75 mg by mouth 2 (two) times daily. [provider] Taking Active Self  sertraline (ZOLOFT) 50 MG tablet [id number] No Take 50 mg by mouth daily.  Patient not taking: Reported on 01/12/2022   [provider] Not Taking Active   sitaGLIPtin (JANUVIA) 100 MG tablet [id number] No Take 1 tablet (100 mg total) by mouth daily.  Patient not taking: Reported on 01/19/2020   Synetta Fail, MD Not Taking Active   traZODone (DESYREL)  50 MG tablet [id number] No Take 1 tablet (50 mg total) by mouth at bedtime.  Patient not taking: Reported on 01/12/2022   [name], MD Not Taking Active Self  Vitamin D, Ergocalciferol, (DRISDOL) 1.25 MG (50000 UNIT) CAPS capsule [id number] No Take 50,000 Units by mouth once a week. [provider] Taking Active            Patient Active Problem List   Diagnosis Date Noted   Myofascial pain dysfunction syndrome 11/25/2018   Nerve pain 11/25/2018   Hypertension 09/19/2018   Insomnia 09/19/2018   Hematuria 09/19/2018   Left ankle pain 06/25/2018   Lumbar spine pain 06/25/2018   Thoracic spine pain 06/25/2018   Cervical spine pain 06/25/2018   Hidradenitis suppurativa 01/18/2018   History of diverticulitis 10/03/2017   Constipation 10/03/2017   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   Morbid obesity (HCC) 08/17/2017   Hypokalemia 08/17/2017   Headache 02/28/2017   Heart palpitations 06/04/2012   Tobacco use 06/03/2012   Preventative health care 05/03/2011   Diabetes type 2, uncontrolled 02/24/2008   Polycystic ovarian disease 07/27/2006   GERD 03/02/2006   Conditions to be addressed/monitored per PCP order:  Chronic healthcare management needs, tobacco use, anxiety, DM, chronic pain, HTN, H/A, carpal tunnel, GERD  Care Plan : General Plan of Care (Adult)  Updates made by [name], RN since 01/19/2022 12:00 AM     Problem: Health Promotion or Disease Self-Management (General Plan of Care)   Priority: High  Onset Date: 01/19/2020     Long-Range Goal: Self-Management Plan Developed   Start Date: 01/19/2020  Expected End Date: 04/20/2022  Recent Progress: Not on track  Priority: High  Note:   Current Barriers:  Knowledge Deficits related to medications. Chronic Disease Management support and education needs. Patient with DM2, HTN, tobacco use, anxiety, chronic pain, headaches. Patient given 1-800-quit now phone number. 01/19/22:  Patient seen and evaluated  by CARDS 1/11 and has appt with PCP tomorrow.  Patient starting to wear Zio monitor today.  No change in smoking-1/2 ppd.  Patient requests Psychiatry referral-LCSW appt scheduled.  Nurse Case Manager Clinical Goal(s):  Over the next 90 days, patient will attend all scheduled medical appointments: Over the next 90 days, patient will work with CM team pharmacist to review medications. Over the next 90 days, patient will work with Psychiatry to address anxiety. Over the next 30 days, patient will schedule  an appointment  with a PCP and get new meter. Over the next 30 days, patient will take all of her medications as directed.  Interventions:  Inter-disciplinary care team collaboration (see longitudinal plan of care) Evaluation of current treatment plan and patient's adherence to plan as established by provider. Reviewed medications with patient. Collaborated with pharmacy and social work. Discussed plans with patient for ongoing care management follow up and provided patient with direct contact information for care management team Reviewed scheduled/upcoming provider appointments Collaborated with BSW for Podiatry provider  and eye provider, utility resources BSW referral for Podiatrist and eye provider, utility resources-completed Social Work referral for anxiety, resources for light bill-completed. Update 06/23/20:  patient has met with Pharmacist and Social Worker-continues to follow.  Care Guide referral for PCP resources-completed. Collaborated with Care Guide for resources LCSW referral for anxiety/depression-patient requests a Psychiatry referral Collaborated with LCSW  Diabetes Interventions:  (Status:  New goal.) Long Term Goal Assessed patient's understanding of A1c goal: <7% Reviewed medications with patient and discussed importance of medication adherence Counseled on importance of regular laboratory monitoring as prescribed Discussed plans with patient for ongoing care  management follow up and provided patient with direct contact information for care management team Reviewed scheduled/upcoming provider appointments  Advised patient, providing education and rationale, to check cbg as directed  and record, calling provider  for findings outside established parameters Referral made to social work team for assistance with anxiety/depression/appt Review of patient status, including review of consultants reports, relevant laboratory and other test results, and medications completed Screening for signs and symptoms of depression related to chronic disease state  Assessed social determinant of health barriers Lab Results  Component Value Date   HGBA1C 9.9 (A) 09/18/2018       HGBA1C                                                       9,9                                   15/5/24  Hypertension Interventions:  (Status:  New goal.) Long Term Goal Last practice recorded BP readings:  BP Readings from Last 3 Encounters:  01/18/22 137/74  01/12/22 (!) 168/100  11/08/20 (!) 146/83   Most recent eGFR/CrCl:  Lab Results  Component Value Date   EGFR 110 01/12/2022    No components found for: "CRCL"  Evaluation of current treatment plan related to hypertension self management and patient's adherence to plan as established by provider Reviewed medications with patient and discussed importance of compliance Discussed plans with patient for ongoing care management follow up and provided patient with direct contact information for care management team Advised patient, providing education and rationale, to monitor blood pressure daily and record, calling PCP for findings outside established parameters Reviewed scheduled/upcoming provider appointments including:  Screening for signs and symptoms of depression related to chronic disease state  Assessed social determinant of health barriers   Patient Goals/Self-Care Activities Over the next 30 days, patient will:   -Attends all scheduled provider appointments Calls pharmacy for medication refills Calls provider office for new concerns or questions Obtain new CBG meter and check blood sugars Take all medications  Follow Up Plan: The Managed Medicaid care management team will reach out to the patient again over the next 30 business days.  The patient has been provided with contact information for the Managed Medicaid care management team and has been advised to call with any health related questions or concerns.    Follow Up:  Patient agrees to Care Plan and Follow-up.  Plan: The Managed Medicaid care management team will reach out to the patient again over the next 30 business  days. and The  Patient has been provided with contact information for the Managed Medicaid care management team and has been advised to call with any health related questions or concerns.  Date/time of next scheduled RN care management/care coordination outreach:  02/15/22 at 315.

## 2022-01-20 ENCOUNTER — Other Ambulatory Visit: Payer: Self-pay | Admitting: *Deleted

## 2022-01-20 DIAGNOSIS — I2510 Atherosclerotic heart disease of native coronary artery without angina pectoris: Secondary | ICD-10-CM

## 2022-01-20 DIAGNOSIS — Z0000 Encounter for general adult medical examination without abnormal findings: Secondary | ICD-10-CM

## 2022-01-20 MED ORDER — ROSUVASTATIN CALCIUM 10 MG PO TABS: 10.0000 mg | ORAL_TABLET | Freq: Every day | ORAL | 3 refills | Status: DC

## 2022-01-25 ENCOUNTER — Other Ambulatory Visit: Payer: Medicaid Other | Admitting: Licensed Clinical Social Worker

## 2022-01-25 DIAGNOSIS — G4700 Insomnia, unspecified: Secondary | ICD-10-CM

## 2022-01-25 DIAGNOSIS — F411 Generalized anxiety disorder: Secondary | ICD-10-CM

## 2022-01-25 NOTE — Patient Instructions (Signed)
Visit Information  Misty [name] was given information about Medicaid Managed Care team care coordination services as a part of their [facility] Medicaid benefit. [place] Misty [name] verbally consented to engagement with the [facility] Managed Care team.   If you are experiencing a medical emergency, please call 911 or report to your local emergency department or urgent care.   If you have a non-emergency medical problem during routine business hours, please contact your provider's office and ask to speak with a nurse.   For questions related to your [facility] health plan, please call: [phone] or go here:[email or web address]  If you would like to schedule transportation through your [facility] plan, please call the following number at least 2 days in advance of your appointment: [phone].  You can also use the MTM portal or MTM mobile app to manage your rides. For the portal, please go to [email or web address].  Call the [place] at [phone], at any time, 24 hours a day, 7 days a week. If you are in danger or need immediate medical attention call 911.  If you would like help to quit smoking, call 1-800-QUIT-NOW 647-079-0535) OR Espaol: 1-855-Djelo-Ya (3-016-010-9323) o para ms informacin haga clic aqu or Text READY to 200-400 to register via text   Timeframe:  Long-Range Goal Priority:  High Start Date:  01/25/22                         Expected End Date: ongoing                      Follow Up Date 02/06/22 at 3 pm   Misty [name] is a 42 y.o. year old female who sees Jose Persia, MD for primary care. The  [facility] Managed Care team was consulted for assistance with Mental Health Concerns . Misty [name] was given information about Care Management services, agreed to services, and verbal consent for services was obtained.  Current barriers:   Chronic Mental Health needs related to anxiety Mental  Health Concerns  Needs Support, Education, and Care Coordination in order to meet unmet mental health needs. Clinical Goal(s): demonstrate a reduction in symptoms related to :Anxiety with Excessive Worry, Panic Symptoms, and Depression: depressed mood anxiety    10 LITTLE Things To Do When You're Feeling Too Down To Do Anything  Take a shower. Even if you plan to stay in all day long and not see a soul, take a shower. It takes the most effort to hop in to the shower but once you do, you'll feel immediate results. It will wake you up and you'll be feeling much fresher (and cleaner too).  Brush and floss your teeth. Give your teeth a good brushing with a floss finish. It's a small task but it feels so good and you can check 'taking care of your health' off the list of things to do.  Do something small on your list. Most of [place] have some small thing we would like to get done (load of laundry, sew a button, email a friend). Doing one of these things will make you feel like you've accomplished something.  Drink water. Drinking water is easy right? It's also really beneficial for your health so keep a glass beside you all day and take sips often. It gives you energy and prevents you from boredom eating.  Do some floor exercises. The last thing you want to do is exercise but it might be just  the thing you need the most. Keep it simple and do exercises that involve sitting or laying on the floor. Even the smallest of exercises release chemicals in the brain that make you feel good. Yoga stretches or core exercises are going to make you feel good with minimal effort.  Make your bed. Making your bed takes a few minutes but it's productive and you'll feel relieved when it's done. An unmade bed is a huge visual reminder that you're having an unproductive day. Do it and consider it your housework for the day.  Put on some nice clothes. Take the sweatpants off even if you don't plan to go anywhere. Put  on clothes that make you feel good. Take a look in the mirror so your brain recognizes the sweatpants have been replaced with clothes that make you look great. It's an instant confidence booster.  Wash the dishes. A pile of dirty dishes in the sink is a reflection of your mood. It's possible that if you wash up the dishes, your mood will follow suit. It's worth a try.  Cook a real meal. If you have the luxury to have a "do nothing" day, you have time to make a real meal for yourself. Make a meal that you love to eat. The process is good to get you out of the funk and the food will ensure you have more energy for tomorrow.  Write out your thoughts by hand. When you hand write, you stimulate your brain to focus on the moment that you're in so make yourself comfortable and write whatever comes into your mind. Put those thoughts out on paper so they stop spinning around in your head. Those thoughts might be the very thing holding you down. Patient Goals/Self-Care Activities: Over the next 120 days connect with provider for ongoing mental health treatment.   Increase coping skills, healthy habits, self-management skills, and stress reduction      01/25/2022   11:33 AM 01/19/2022    3:53 PM 03/31/2021    2:38 PM 10/25/2020    9:06 AM 08/18/2020    2:55 PM  Depression screen PHQ 2/9  Decreased Interest 2 2 1 1 1   Down, Depressed, Hopeless 2 2 1 1 1   PHQ - 2 Score 4 4 2 2 2   Altered sleeping 2 1 2 2    Tired, decreased energy 2 2 1 1    Change in appetite 1 1 1 1    Feeling bad or failure about yourself  1 1 1 1    Trouble concentrating 1 1 1 1    Moving slowly or fidgety/restless 0 0 0 0   Suicidal thoughts 0 0 0 0   PHQ-9 Score 11 10 8 8    Difficult doing work/chores Very difficult Somewhat difficult Somewhat difficult Somewhat difficult    If you are experiencing a Mental Health or [place] or need someone to talk to, please call the Suicide and Crisis Lifeline: 988
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
## 2022-02-02 DIAGNOSIS — Z419 Encounter for procedure for purposes other than remedying health state, unspecified: Secondary | ICD-10-CM | POA: Diagnosis not present

## 2022-02-06 ENCOUNTER — Other Ambulatory Visit: Payer: Medicaid Other | Admitting: Licensed Clinical Social Worker

## 2022-02-06 DIAGNOSIS — N914 Secondary oligomenorrhea: Secondary | ICD-10-CM | POA: Diagnosis not present

## 2022-02-06 DIAGNOSIS — Z6834 Body mass index (BMI) 34.0-34.9, adult: Secondary | ICD-10-CM | POA: Diagnosis not present

## 2022-02-06 DIAGNOSIS — Z79899 Other long term (current) drug therapy: Secondary | ICD-10-CM | POA: Diagnosis not present

## 2022-02-06 DIAGNOSIS — E119 Type 2 diabetes mellitus without complications: Secondary | ICD-10-CM | POA: Diagnosis not present

## 2022-02-06 DIAGNOSIS — Z794 Long term (current) use of insulin: Secondary | ICD-10-CM | POA: Diagnosis not present

## 2022-02-06 DIAGNOSIS — R7309 Other abnormal glucose: Secondary | ICD-10-CM | POA: Diagnosis not present

## 2022-02-06 DIAGNOSIS — M545 Low back pain, unspecified: Secondary | ICD-10-CM | POA: Diagnosis not present

## 2022-02-06 NOTE — Patient Instructions (Signed)
Visit Information  Misty [name] was given information about Medicaid Managed Care team care coordination services as a part of their [facility] Medicaid benefit. [place] [name] verbally consented to engagement with the [facility] Managed Care team.   If you are experiencing a medical emergency, please call 911 or report to your local emergency department or urgent care.   If you have a non-emergency medical problem during routine business hours, please contact your provider's office and ask to speak with a nurse.   For questions related to your [facility] health plan, please call: [phone] or go here:[email or web address]  If you would like to schedule transportation through your [facility] plan, please call the following number at least 2 days in advance of your appointment: [phone].  You can also use the MTM portal or MTM mobile app to manage your rides. For the portal, please go to [email or web address].  Call the [place] at [phone], at any time, 24 hours a day, 7 days a week. If you are in danger or need immediate medical attention call 911.  If you would like help to quit smoking, call 1-800-QUIT-NOW (734)813-6660) OR Espaol: 1-855-Djelo-Ya (3-818-299-3716) o para ms informacin haga clic aqu or Text READY to 200-400 to register via text    Timeframe:  Long-Range Goal Priority:  High Start Date:  01/25/22                         Expected End Date: ongoing                      Follow Up Date 02/15/22 at 1 pm   Misty [name] is a 42 y.o. year old female who sees Misty Persia, MD for primary care. The  [facility] Managed Care team was consulted for assistance with Mental Health Concerns . Misty [name] was given information about Care Management services, agreed to services, and verbal consent for services was obtained.  Current barriers:   Chronic Mental Health needs related to anxiety Mental  Health Concerns  Needs Support, Education, and Care Coordination in order to meet unmet mental health needs. Clinical Goal(s): demonstrate a reduction in symptoms related to :Anxiety with Excessive Worry, Panic Symptoms, and Depression: depressed mood anxiety     10 LITTLE Things To Do When You're Feeling Too Down To Do Anything  Take a shower. Even if you plan to stay in all day long and not see a soul, take a shower. It takes the most effort to hop in to the shower but once you do, you'll feel immediate results. It will wake you up and you'll be feeling much fresher (and cleaner too).  Brush and floss your teeth. Give your teeth a good brushing with a floss finish. It's a small task but it feels so good and you can check 'taking care of your health' off the list of things to do.  Do something small on your list. Most of [place] have some small thing we would like to get done (load of laundry, sew a button, email a friend). Doing one of these things will make you feel like you've accomplished something.  Drink water. Drinking water is easy right? It's also really beneficial for your health so keep a glass beside you all day and take sips often. It gives you energy and prevents you from boredom eating.  Do some floor exercises. The last thing you want to do is exercise but it might  be just the thing you need the most. Keep it simple and do exercises that involve sitting or laying on the floor. Even the smallest of exercises release chemicals in the brain that make you feel good. Yoga stretches or core exercises are going to make you feel good with minimal effort.  Make your bed. Making your bed takes a few minutes but it's productive and you'll feel relieved when it's done. An unmade bed is a huge visual reminder that you're having an unproductive day. Do it and consider it your housework for the day.  Put on some nice clothes. Take the sweatpants off even if you don't plan to go anywhere. Put  on clothes that make you feel good. Take a look in the mirror so your brain recognizes the sweatpants have been replaced with clothes that make you look great. It's an instant confidence booster.  Wash the dishes. A pile of dirty dishes in the sink is a reflection of your mood. It's possible that if you wash up the dishes, your mood will follow suit. It's worth a try.  Cook a real meal. If you have the luxury to have a "do nothing" day, you have time to make a real meal for yourself. Make a meal that you love to eat. The process is good to get you out of the funk and the food will ensure you have more energy for tomorrow.  Write out your thoughts by hand. When you hand write, you stimulate your brain to focus on the moment that you're in so make yourself comfortable and write whatever comes into your mind. Put those thoughts out on paper so they stop spinning around in your head. Those thoughts might be the very thing holding you down. Patient Goals/Self-Care Activities: Over the next 120 days connect with provider for ongoing mental health treatment.   Increase coping skills, healthy habits, self-management skills, and stress reduction      01/25/2022   11:33 AM 01/19/2022    3:53 PM 03/31/2021    2:38 PM 10/25/2020    9:06 AM 08/18/2020    2:55 PM  Depression screen PHQ 2/9  Decreased Interest 2 2 1 1 1   Down, Depressed, Hopeless 2 2 1 1 1   PHQ - 2 Score 4 4 2 2 2   Altered sleeping 2 1 2 2    Tired, decreased energy 2 2 1 1    Change in appetite 1 1 1 1    Feeling bad or failure about yourself  1 1 1 1    Trouble concentrating 1 1 1 1    Moving slowly or fidgety/restless 0 0 0 0   Suicidal thoughts 0 0 0 0   PHQ-9 Score 11 10 8 8    Difficult doing work/chores Very difficult Somewhat difficult Somewhat difficult Somewhat difficult    If you are experiencing a Mental Health or [place] or need someone to talk to, please call the Suicide and Crisis Lifeline: 988
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
## 2022-02-08 ENCOUNTER — Other Ambulatory Visit (HOSPITAL_COMMUNITY): Payer: Medicaid Other

## 2022-02-09 DIAGNOSIS — Z79899 Other long term (current) drug therapy: Secondary | ICD-10-CM | POA: Diagnosis not present

## 2022-02-15 ENCOUNTER — Other Ambulatory Visit: Payer: Medicaid Other | Admitting: Obstetrics and Gynecology

## 2022-02-15 ENCOUNTER — Other Ambulatory Visit: Payer: Medicaid Other | Admitting: Licensed Clinical Social Worker

## 2022-02-15 NOTE — Patient Outreach (Signed)
  Medicaid Managed Care   Unsuccessful Attempt Note   02/15/2022 Name: GLYNNA FAILLA MRN: [id number] DOB: 01/25/1980  Referred by: [facility] [place] Reason for referral : High Risk Managed Medicaid (Unsuccessful telephone outreach)   An unsuccessful telephone outreach was attempted today. The patient was referred to the case management team for assistance with care management and care coordination.    Follow Up Plan: The Managed Medicaid care management team will reach out to the patient again over the next 30 business  days. and The  Patient has been provided with contact information for the Managed Medicaid care management team and has been advised to call with any health related questions or concerns.    [name] RN, BSN [place]  [facility] Curator - Managed Medicaid High Risk [phone]

## 2022-02-15 NOTE — Patient Instructions (Signed)
Hi [name], I hope you and your daughter are doing ok and I am sorry to have missed you today when I called  - as a part of your Medicaid benefit, you are eligible for care management and care coordination services at no cost or copay. I was unable to reach you by phone today but would be happy to help you with your health related needs. Please feel free to call me at [phone].  A member of the Managed Medicaid care management team will reach out to you again over the next 30 business  days.   [name] RN, BSN [place]  [facility] Curator - Managed Medicaid High Risk [phone]
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
## 2022-02-15 NOTE — Patient Instructions (Signed)
Visit Information  [name] was given information about Medicaid Managed Care team care coordination services as a part of their [facility] Medicaid benefit. [name] verbally consented to engagement with the [facility] Managed Care team.   If you are experiencing a medical emergency, please call 911 or report to your local emergency department or urgent care.   If you have a non-emergency medical problem during routine business hours, please contact your provider's office and ask to speak with a nurse.   For questions related to your [facility] health plan, please call: [phone] or go here:[email or web address]  If you would like to schedule transportation through your [facility] plan, please call the following number at least 2 days in advance of your appointment: [phone].  You can also use the MTM portal or MTM mobile app to manage your rides. For the portal, please go to [email or web address].cy.  Call the [place] at [phone], at any time, 24 hours a day, 7 days a week. If you are in danger or need immediate medical attention call 911.  If you would like help to quit smoking, call 1-800-QUIT-NOW 585-232-2118) OR Espaol: 1-855-Djelo-Ya QO:409462) o para ms informacin haga clic aqu or Text READY to 200-400 to register via tex

## 2022-02-16 ENCOUNTER — Ambulatory Visit (HOSPITAL_COMMUNITY): Payer: Medicaid Other | Admitting: Physician Assistant

## 2022-03-01 ENCOUNTER — Other Ambulatory Visit: Payer: Medicaid Other | Admitting: Licensed Clinical Social Worker
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
## 2022-03-01 NOTE — Patient Instructions (Signed)
Visit Information  [name] was given information about Medicaid Managed Care team care coordination services as a part of their [facility] Medicaid benefit. [name] verbally consented to engagement with the [facility] Managed Care team.   If you are experiencing a medical emergency, please call 911 or report to your local emergency department or urgent care.   If you have a non-emergency medical problem during routine business hours, please contact your provider's office and ask to speak with a nurse.   For questions related to your [facility] health plan, please call: [phone] or go here:[email or web address]  If you would like to schedule transportation through your [facility] plan, please call the following number at least 2 days in advance of your appointment: [phone].  You can also use the MTM portal or MTM mobile app to manage your rides. For the portal, please go to [email or web address].cy.  Call the [place] at [phone], at any time, 24 hours a day, 7 days a week. If you are in danger or need immediate medical attention call 911.  If you would like help to quit smoking, call 1-800-QUIT-NOW 254 181 8599) OR Espaol: 1-855-Djelo-Ya HD:1601594) o para ms informacin haga clic aqu or Text READY to 200-400 to register via text

## 2022-03-03 DIAGNOSIS — Z419 Encounter for procedure for purposes other than remedying health state, unspecified: Secondary | ICD-10-CM | POA: Diagnosis not present

## 2022-03-03 DIAGNOSIS — K760 Fatty (change of) liver, not elsewhere classified: Secondary | ICD-10-CM | POA: Diagnosis not present

## 2022-03-07 ENCOUNTER — Encounter (HOSPITAL_COMMUNITY): Payer: Self-pay | Admitting: Cardiology

## 2022-03-07 ENCOUNTER — Other Ambulatory Visit (HOSPITAL_COMMUNITY): Payer: Medicaid Other

## 2022-03-07 ENCOUNTER — Encounter: Payer: Self-pay | Admitting: *Deleted

## 2022-03-07 DIAGNOSIS — N914 Secondary oligomenorrhea: Secondary | ICD-10-CM | POA: Diagnosis not present

## 2022-03-07 DIAGNOSIS — R7309 Other abnormal glucose: Secondary | ICD-10-CM | POA: Diagnosis not present

## 2022-03-07 DIAGNOSIS — E119 Type 2 diabetes mellitus without complications: Secondary | ICD-10-CM | POA: Diagnosis not present

## 2022-03-07 DIAGNOSIS — Z79899 Other long term (current) drug therapy: Secondary | ICD-10-CM | POA: Diagnosis not present

## 2022-03-07 DIAGNOSIS — Z6834 Body mass index (BMI) 34.0-34.9, adult: Secondary | ICD-10-CM | POA: Diagnosis not present

## 2022-03-07 DIAGNOSIS — M545 Low back pain, unspecified: Secondary | ICD-10-CM | POA: Diagnosis not present

## 2022-03-07 DIAGNOSIS — Z794 Long term (current) use of insulin: Secondary | ICD-10-CM | POA: Diagnosis not present

## 2022-03-07 DIAGNOSIS — R030 Elevated blood-pressure reading, without diagnosis of hypertension: Secondary | ICD-10-CM | POA: Diagnosis not present

## 2022-03-09 DIAGNOSIS — Z79899 Other long term (current) drug therapy: Secondary | ICD-10-CM | POA: Diagnosis not present

## 2022-03-10 ENCOUNTER — Other Ambulatory Visit: Payer: Medicaid Other | Admitting: Licensed Clinical Social Worker

## 2022-03-10 NOTE — Patient Outreach (Signed)
Medicaid Managed Care Social Work Note  03/10/2022 Name:  Misty [name] MRN:  [id number] DOB:  [birth year]  [place] Misty [name] is an 42 y.o. year old female who is a primary patient of [facility] [place] Coordination team was consulted for assistance with:  [place] and Resources  [name] was given information about Medicaid Managed Care Coordination team services today. [place] Patient agreed to services and verbal consent obtained.  Engaged with patient  for by telephone forfollow up visit in response to referral for case management and/or care coordination services.   Assessments/Interventions:  Review of past medical history, allergies, medications, health status, including review of consultants reports, laboratory and other test data, was performed as part of comprehensive evaluation and provision of chronic care management services.  SDOH: (Social Determinant of Health) assessments and interventions performed: SDOH Interventions    Flowsheet Row Patient Outreach Telephone from 03/01/2022 in [place] Patient Outreach Telephone from 02/15/2022 in [place] Patient Outreach Telephone from 02/06/2022 in [place] Patient Outreach Telephone from 01/25/2022 in [place] Patient Outreach Telephone from 01/19/2022 in [place] Patient Outreach Telephone from 12/16/2021 in [place]  SDOH Interventions        Food Insecurity Interventions -- -- -- -- Intervention Not Indicated --  Housing Interventions -- -- -- -- -- Intervention Not Indicated  Transportation Interventions -- -- -- -- -- Intervention Not Indicated  Depression Interventions/Treatment  -- -- -- Counseling, Referral to Psychiatry Referral to Psychiatry  [LCSW  referral placed  to assist with appt] --  Stress Interventions [place], Provide Counseling Offered [employer], Provide Counseling Offered [employer], Provide Counseling Offered [employer], Provide Counseling -- --       Advanced Directives Status:  See Care Plan for related entries.  Care Plan                 No Known Allergies  Medications Reviewed Today     Reviewed by [name], LCSW (Social Worker) on 02/06/22 at [place] List Status: <None>   Medication Order Taking? Sig Documenting Provider Last Dose Status Informant  Accu-Chek FastClix Lancets MISC [id number] No Use twice per day to check your blood glucose.  E11.65  Patient not taking: Reported on 05/27/2021   Misty Persia, MD Not Taking Active Self  diclofenac sodium (VOLTAREN) 1 % GEL [id number] No Apply 2 g topically 4 (four) times daily. Caccavale, Sophia, PA-C Taking Active   diltiazem (TIAZAC) 180 MG 24 hr capsule [id number] No Take 180 mg by mouth daily. [provider] Taking Active   Dulaglutide (TRULICITY) A999333 0000000 SOPN [id number] No Inject into the skin. [provider] Taking Active   DULoxetine (CYMBALTA) 30 MG capsule [id number] No Take 1 capsule (30 mg total) by mouth at bedtime. Take 1 tab nightly x 1 week then 2 tabs nightly- for nerve pain Lovorn, Megan, MD Taking Expired 02/08/21 2359   gabapentin (NEURONTIN) 300 MG capsule [id number] No Take 300 mg by mouth at bedtime.  Patient not taking: Reported on 02/08/2021   [provider] Not Taking Active            Med Note Misty [name], [name]   Sun Oct 06, 2018  8:29 AM)    glucose blood (ACCU-CHEK GUIDE) test strip [id number] No Use  one strip, twice daily to check your blood glucose.  E11.65  Patient not taking: Reported on 05/27/2021   Misty Persia, MD Not Taking Active Self  LANTUS SOLOSTAR 100 UNIT/ML Solostar Pen [id number] No SMARTSIG:10 Unit(s)  SUB-Q Morning-Night [provider] Taking Active   lisinopril (ZESTRIL) 10 MG tablet [id number] No Take 10 mg by mouth daily. [provider] Taking Active   meloxicam (MOBIC) 7.5 MG tablet [id number] No meloxicam 7.5 mg tablet  TAKE 1 TABLET BY MOUTH DAILY  Patient not taking: Reported on 01/12/2022   [provider] Not Taking Active   methocarbamol (ROBAXIN) 500 MG tablet [id number] No Take 500 mg by mouth 4 (four) times daily.  Patient not taking: Reported on 10/14/2020   [provider] Not Taking Active   metoprolol tartrate (LOPRESSOR) 100 MG tablet [id number]  Take 1 tablet (100 mg total) by mouth 2 (two) times daily. Jerline Pain, MD  Active   oxyCODONE-acetaminophen (PERCOCET) 10-325 MG tablet [id number] No Take 1 tablet by mouth 4 (four) times daily as needed for pain. [provider] Taking Active   pioglitazone (ACTOS) 30 MG tablet [id number] No Take 1 tablet (30 mg total) by mouth daily. Misty Persia, MD Taking Active   prazosin (MINIPRESS) 2 MG capsule [id number] No Take 2 mg by mouth at bedtime. [provider] Taking Active   predniSONE (DELTASONE) 5 MG tablet [id number] No Take by mouth daily with breakfast.  Patient not taking: Reported on 07/08/2020   [provider] Not Taking Active   pregabalin (LYRICA) 75 MG capsule [id number] No Take 75 mg by mouth 2 (two) times daily. [provider] Taking Active Self  rosuvastatin (CRESTOR) 10 MG tablet [id number]  Take 1 tablet (10 mg total) by mouth daily. Jerline Pain, MD  Active   sertraline (ZOLOFT) 50 MG tablet [id number] No Take 50 mg by mouth daily.  Patient not taking: Reported on 01/12/2022   [provider] Not Taking Active   sitaGLIPtin (JANUVIA) 100 MG tablet [id number] No Take 1 tablet (100 mg total) by mouth daily.  Patient not taking: Reported on 01/19/2020   Misty Bruins, MD Not Taking Active   traZODone (DESYREL) 50 MG tablet [id number]  No Take 1 tablet (50 mg total) by mouth at bedtime.  Patient not taking: Reported on 01/12/2022   Misty Persia, MD Not Taking Active Self  Vitamin D, Ergocalciferol, (DRISDOL) 1.25 MG (50000 UNIT) CAPS capsule [id number] No Take 50,000 Units by mouth once a week. 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[place]) 08/17/2017   Morbid obesity ([place]) 08/17/2017   Hypokalemia 08/17/2017   Headache 02/28/2017   Heart palpitations 06/04/2012   Tobacco use 06/03/2012   Preventative health care 05/03/2011   Diabetes type 2, uncontrolled 02/24/2008   Polycystic ovarian disease 07/27/2006   GERD 03/02/2006      01/25/2022   11:33 AM 01/19/2022    3:53 PM 03/31/2021    2:38 PM 10/25/2020    9:06  AM 08/18/2020    2:55 PM  Depression screen PHQ 2/9  Decreased Interest '2 2 1 1 1  '$ Down, Depressed, Hopeless '2 2 1 1 1  '$ PHQ - 2 Score '4 4 2 2 2  '$ Altered sleeping '2 1 2 2   '$ Tired, decreased energy '2 2 1 1   '$ Change in appetite '1 1 1 1   '$ Feeling bad or failure about yourself  '1 1 1 1   '$ Trouble concentrating '1 1 1 1   '$ Moving slowly or fidgety/restless 0 0 0 0   Suicidal thoughts 0 0 0 0   PHQ-9 Score '11 10 8 8   '$ Difficult doing work/chores Very difficult Somewhat difficult Somewhat difficult Somewhat difficult    Eula Fried, BSW, MSW, [employer] Managed Medicaid LCSW [email or web address] Phone: [phone]

## 2022-03-10 NOTE — Patient Instructions (Signed)
Visit Information  Misty [name] was given information about Medicaid Managed Care team care coordination services as a part of their [facility] Medicaid benefit. Misty [name] verbally consented to engagement with the [facility] Managed Care team.   If you are experiencing a medical emergency, please call 911 or report to your local emergency department or urgent care.   If you have a non-emergency medical problem during routine business hours, please contact your provider's office and ask to speak with a nurse.   For questions related to your [facility] health plan, please call: [phone] or go here:[email or web address]  If you would like to schedule transportation through your [facility] plan, please call the following number at least 2 days in advance of your appointment: [phone].  You can also use the MTM portal or MTM mobile app to manage your rides. For the portal, please go to [email or web address].  Call the [place] at [phone], at any time, 24 hours a day, 7 days a week. If you are in danger or need immediate medical attention call 911.  If you would like help to quit smoking, call 1-800-QUIT-NOW (747)608-0783) OR Espaol: 1-855-Djelo-Ya HD:1601594) o para ms informacin haga clic aqu or Text READY to 200-400 to register via text  Eula Fried, [place], MSW, [employer] Managed Medicaid LCSW [email or web address] Phone: [phone]

## 2022-03-13 ENCOUNTER — Encounter (HOSPITAL_COMMUNITY): Payer: Self-pay

## 2022-03-13 ENCOUNTER — Ambulatory Visit (INDEPENDENT_AMBULATORY_CARE_PROVIDER_SITE_OTHER): Payer: Medicaid Other | Admitting: Clinical

## 2022-03-13 DIAGNOSIS — F4310 Post-traumatic stress disorder, unspecified: Secondary | ICD-10-CM | POA: Diagnosis not present

## 2022-03-16 DIAGNOSIS — A499 Bacterial infection, unspecified: Secondary | ICD-10-CM | POA: Diagnosis not present

## 2022-03-16 DIAGNOSIS — T3695XA Adverse effect of unspecified systemic antibiotic, initial encounter: Secondary | ICD-10-CM | POA: Diagnosis not present

## 2022-03-16 DIAGNOSIS — Z6833 Body mass index (BMI) 33.0-33.9, adult: Secondary | ICD-10-CM | POA: Diagnosis not present

## 2022-03-16 DIAGNOSIS — Z794 Long term (current) use of insulin: Secondary | ICD-10-CM | POA: Diagnosis not present

## 2022-03-16 DIAGNOSIS — E119 Type 2 diabetes mellitus without complications: Secondary | ICD-10-CM | POA: Diagnosis not present

## 2022-03-16 DIAGNOSIS — E7800 Pure hypercholesterolemia, unspecified: Secondary | ICD-10-CM | POA: Diagnosis not present

## 2022-03-16 DIAGNOSIS — B379 Candidiasis, unspecified: Secondary | ICD-10-CM | POA: Diagnosis not present

## 2022-03-16 DIAGNOSIS — I10 Essential (primary) hypertension: Secondary | ICD-10-CM | POA: Diagnosis not present

## 2022-03-16 DIAGNOSIS — N390 Urinary tract infection, site not specified: Secondary | ICD-10-CM | POA: Diagnosis not present

## 2022-03-16 DIAGNOSIS — R030 Elevated blood-pressure reading, without diagnosis of hypertension: Secondary | ICD-10-CM | POA: Diagnosis not present

## 2022-03-17 ENCOUNTER — Encounter: Payer: Self-pay | Admitting: Obstetrics and Gynecology

## 2022-03-17 ENCOUNTER — Other Ambulatory Visit: Payer: Medicaid Other | Admitting: Obstetrics and Gynecology

## 2022-03-17 NOTE — Patient Outreach (Signed)
Medicaid Managed Care   Nurse Care Manager Note  03/17/2022 Name:  Misty [name] MRN:  [id number] DOB:  October 25, 1980  Misty [name] is an 42 y.o. year old female who is a primary patient of [facility] [place] Coordination team was consulted for assistance with:    Chronic healthcare management needs, tobacco use, anxiety, DM, chronic [name], HTN, H/A, carpal tunnel, GERD  [name] was given information about Medicaid Managed Care Coordination team services today. [name] agreed to services and verbal consent obtained.  Engaged with patient by telephone for follow up visit in response to provider referral for case management and/or care coordination services.   Assessments/Interventions:  Review of past medical history, allergies, medications, health status, including review of consultants reports, laboratory and other test data, was performed as part of comprehensive evaluation and provision of chronic care management services.  SDOH (Social Determinants of Health) assessments and interventions performed: SDOH Interventions    Flowsheet Row Patient Outreach Telephone from 03/17/2022 in [place] Patient Outreach Telephone from 03/01/2022 in [place] Patient Outreach Telephone from 02/15/2022 in [place] Patient Outreach Telephone from 02/06/2022 in [place] Patient Outreach Telephone from 01/25/2022 in [place] Patient Outreach Telephone from 01/19/2022 in [place]  SDOH Interventions        Food Insecurity Interventions -- -- -- -- -- Intervention Not Indicated  Utilities Interventions Intervention Not Indicated -- -- -- -- --  Alcohol Usage Interventions Intervention Not Indicated (Score <7) -- -- -- -- --  Depression  Interventions/Treatment  -- -- -- -- Counseling, Referral to Psychiatry Referral to Psychiatry  [LCSW referral placed  to assist with appt]  Stress Interventions -- [employer], Provide Counseling Offered [employer], Provide Counseling Offered [employer], Provide Counseling Offered [facility], Provide Counseling --     Care Plan  No Known Allergies  Medications Reviewed Today     Reviewed by Misty [name] (Registered Nurse) on 03/17/22 at Misty [name] List Status: <None>   Medication Order Taking? Sig Documenting Provider Last Dose Status Informant  Accu-Chek FastClix Lancets MISC [id number] No Use twice per day to check your blood glucose.  E11.65  Patient not taking: Reported on 03/17/2022   Misty Persia, MD Not Taking Active Self  diclofenac sodium (VOLTAREN) 1 % GEL [id number] No Apply 2 g topically 4 (four) times daily.  Patient not taking: Reported on 03/17/2022   Misty Heidelberg, PA-C Not Taking Active   diltiazem (TIAZAC) 180 MG 24 hr capsule [id number] No Take 180 mg by mouth daily.  Patient not taking: Reported on 03/17/2022   [provider] Not Taking Active   Dulaglutide (TRULICITY) A999333 0000000 SOPN [id number] No Inject into the skin.  Patient not taking: Reported on 03/17/2022   [provider] Not Taking Active   DULoxetine (CYMBALTA) 30 MG capsule [id number]  Take 1 capsule (30 mg total) by mouth at bedtime. Take 1 tab nightly x 1 week then 2 tabs nightly- for nerve [name] [name], Megan, MD  Expired 02/08/21 2359   gabapentin (NEURONTIN) 300 MG capsule [id number]  Take 300 mg by mouth at bedtime.  Patient not taking: Reported on 02/08/2021   [provider]  Active            Med Note Misty [name]  Sun Oct 06, 2018  8:29 AM)    glucose blood (ACCU-CHEK GUIDE) test strip [id number]  Use one strip, twice daily to check your blood glucose.  E11.65  Patient not taking:  Reported on 05/27/2021   Misty Persia, MD  Active Self  LANTUS SOLOSTAR 100 UNIT/ML Solostar Pen [id number]  SMARTSIG:10 Unit(s) SUB-Q Morning-Night [provider]  Active   lisinopril (ZESTRIL) 10 MG tablet [id number] No Take 10 mg by mouth daily.  Patient not taking: Reported on 03/17/2022   [provider] Not Taking Active   meloxicam (MOBIC) 7.5 MG tablet [id number]  meloxicam 7.5 mg tablet  TAKE 1 TABLET BY MOUTH DAILY  Patient not taking: Reported on 01/12/2022   [provider]  Active   methocarbamol (ROBAXIN) 500 MG tablet [id number]  Take 500 mg by mouth 4 (four) times daily.  Patient not taking: Reported on 10/14/2020   [provider]  Active   metoprolol tartrate (LOPRESSOR) 100 MG tablet [id number] No Take 1 tablet (100 mg total) by mouth 2 (two) times daily.  Patient not taking: Reported on 03/17/2022   Misty Pain, MD Not Taking Active   oxyCODONE-acetaminophen (PERCOCET) 10-325 MG tablet [id number] No Take 1 tablet by mouth 4 (four) times daily as needed for [name].  Patient not taking: Reported on 03/17/2022   [provider] Not Taking Active   pioglitazone (ACTOS) 30 MG tablet [id number] No Take 1 tablet (30 mg total) by mouth daily.  Patient not taking: Reported on 03/17/2022   Misty Persia, MD Not Taking Active   prazosin (MINIPRESS) 2 MG capsule [id number] No Take 2 mg by mouth at bedtime.  Patient not taking: Reported on 03/17/2022   [provider] Not Taking Active   predniSONE (DELTASONE) 5 MG tablet [id number]  Take by mouth daily with breakfast.  Patient not taking: Reported on 07/08/2020   [provider]  Active   pregabalin (LYRICA) 75 MG capsule [id number] No Take 75 mg by mouth 2 (two) times daily.  Patient not taking: Reported on 03/17/2022   [provider] Not Taking Active Self  rosuvastatin (CRESTOR) 10 MG tablet [id number] No Take 1 tablet (10 mg total) by mouth daily.  Patient not  taking: Reported on 03/17/2022   Misty Pain, MD Not Taking Active   sertraline (ZOLOFT) 50 MG tablet [id number]  Take 50 mg by mouth daily.  Patient not taking: Reported on 01/12/2022   [provider]  Active   sitaGLIPtin (JANUVIA) 100 MG tablet [id number]  Take 1 tablet (100 mg total) by mouth daily.  Patient not taking: Reported on 01/19/2020   [name], MD  Active   traZODone (DESYREL) 50 MG tablet [id number]  Take 1 tablet (50 mg total) by mouth at bedtime.  Patient not taking: Reported on 01/12/2022   Misty Persia, MD  Active Self  Vitamin D, Ergocalciferol, (DRISDOL) 1.25 MG (50000 UNIT) CAPS capsule [id number] No Take 50,000 Units by mouth once a week.  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[place]) 08/17/2017   Morbid obesity ([place]) 08/17/2017   Hypokalemia 08/17/2017   Headache 02/28/2017   Heart palpitations 06/04/2012   Tobacco use 06/03/2012   Preventative health care 05/03/2011   Diabetes type 2, uncontrolled 02/24/2008   Polycystic ovarian disease 07/27/2006   GERD 03/02/2006   Conditions to be addressed/monitored per PCP order:  Chronic healthcare management needs, tobacco use, anxiety, DM, chronic [name], HTN, H/A, carpal tunnel, GERD  Care Plan : General Plan of Care (Adult)  Updates made by Misty [name] since 03/17/2022 12:00 AM     Problem: Health Promotion or Disease Self-Management (General Plan of Care)   Priority: High  Onset  Date: 01/19/2020     Long-Range Goal: Self-Management Plan Developed   Start Date: 01/19/2020  Expected End Date: 06/17/2022  Recent Progress: Not on track  Priority: High  Note:   Current Barriers:  Knowledge Deficits related to medications. Chronic Disease Management support and education needs. Patient with DM2, HTN, tobacco use, anxiety, chronic [name], headaches. Patient given 1-800-quit now phone number. 03/17/22:  Patient states she is out of her meds-cannot afford-is not working right now,  Patient to contact pharmacy about waiving fees and if difficulty to let me know.  Patient with no glucose meter-to contact PCP today to order-just saw PCP yesterday.  Patient [facility] and has Psychiatry appointment on Monday.  Continues to smoke 1/2 ppd.  Pharmacy appt scheduled.  Nurse Case Manager Clinical Goal(s):  Over the next 90 days, patient will attend all scheduled medical appointments: Over the next 90 days, patient will work with CM team pharmacist to review medications. Over the next 90 days, patient will work with Psychiatry to address anxiety. Over the next 30 days, patient will schedule  an appointment  with a PCP and get new meter. Over the next 30 days, patient will take all of her medications as directed.  Interventions:  Inter-disciplinary care team collaboration (see longitudinal plan of care) Evaluation of current treatment plan and patient's adherence to plan as established by provider. Reviewed medications with patient. Collaborated with pharmacy and social work. Discussed plans with patient for ongoing care management follow up and provided patient with direct contact information for care management team Reviewed scheduled/upcoming provider appointments Collaborated with BSW for Podiatry provider  and eye provider, utility resources BSW referral for Podiatrist and eye provider, utility resources-completed Social Work referral for anxiety, resources for light  bill-completed. Care Guide referral for PCP resources-completed. Collaborated with Care Guide for resources LCSW referral for anxiety/depression-patient requests a Psychiatry referral-completed. Collaborated with LCSW Collaborated with Pharmacy Pharmacy referral for help with medicaitons  Hypertension Interventions:  (Status:  New goal.) Long Term Goal Last practice recorded BP readings:  BP Readings from Last 3 Encounters:  01/18/22 137/74  01/12/22 (!) 168/100  11/08/20 (!) 146/83   Most recent eGFR/CrCl:  Lab Results  Component Value Date   EGFR 110 01/12/2022    No components found for: "CRCL"  Advised patient, providing education and rationale, to monitor blood pressure daily and record, calling PCP for findings outside established parameters Reviewed scheduled/upcoming provider appointments Discussed complications of poorly controlled blood pressure such as heart disease, stroke, circulatory complications, vision complications, kidney impairment, sexual dysfunction Assessed social determinant of health barriers  [name] Interventions:  (Status:  New goal.) Long Term Goal [name] assessment performed Medications reviewed Reviewed provider established plan for [name] management Discussed  importance of adherence to all scheduled medical appointments Counseled on the importance of reporting any/all new or changed [name] symptoms or management strategies to [name] management provider Advised patient to report to care team affect of [name] on daily activities Discussed use of relaxation techniques and/or diversional activities to assist with [name] reduction (distraction, imagery, relaxation, massage, acupressure, TENS, heat, and cold application Reviewed with patient prescribed pharmacological and nonpharmacological [name] relief strategies Screening for signs and symptoms of depression related to chronic disease state  Assessed social determinant of health barriers   Diabetes Interventions:   (Status:  New goal.) Long Term Goal Assessed patient's understanding of A1c goal: <7% Reviewed medications with patient and discussed importance of medication adherence Counseled on importance of regular laboratory monitoring as prescribed Discussed plans with patient for ongoing care management follow up and provided patient with direct contact information for care management team Reviewed scheduled/upcoming provider appointments  Advised patient, providing education and rationale, to check cbg as directed  and record, calling provider  for findings outside established parameters Referral made to social work team for assistance with anxiety/depression/appt Review of patient status, including review of consultants reports, relevant laboratory and other test results, and medications completed Screening for signs and symptoms of depression related to chronic disease state  Assessed social determinant of health barriers Lab Results  Component Value Date   HGBA1C 9.9 (A) 09/18/2018       HGBA1C                                                       9,9                                   12/07/22  Hypertension Interventions:  (Status:  New goal.) Long Term Goal Last practice recorded BP readings:  BP Readings from Last 3 Encounters:  01/18/22 137/74  01/12/22 (!) 168/100  11/08/20 (!) 146/83   Most recent eGFR/CrCl:  Lab Results  Component Value Date   EGFR 110 01/12/2022    No components found for: "CRCL"  Evaluation of current treatment plan related to hypertension self management and patient's adherence to plan as established by provider Reviewed medications with patient and discussed importance of compliance Discussed plans with patient for ongoing care management follow up and provided patient with direct contact information for care management team Advised patient, providing education and rationale, to monitor blood pressure daily and record, calling PCP for findings outside established  parameters Reviewed scheduled/upcoming provider appointments including:  Screening for signs and symptoms of depression related to chronic disease state  Assessed social determinant of health barriers   Patient Goals/Self-Care Activities Over the next 30 days, patient will:  -Attends all scheduled provider appointments Calls pharmacy for medication refills Calls provider office for new concerns or questions Obtain new CBG meter and check blood sugars Take all medications  Follow Up Plan: The Managed Medicaid care management team will reach out to the patient again over the next 30 business days.  The patient has been provided with contact information for the Managed Medicaid care management team and has been advised to call with any health related questions or concerns.    Follow Up:  Patient agrees to Care Plan and Follow-up.  Plan: The  Managed Medicaid care management team will reach out to the patient again over the next 30 business  days. and The  Patient has been provided with contact information for the Managed Medicaid care management team and has been advised to call with any health related questions or concerns.  Date/time of next scheduled [name] care management/care coordination outreach: 04/18/22 at 0900.

## 2022-03-17 NOTE — Patient Instructions (Signed)
Hi [name],  Don't forget to follow up on glucose meter and get medications.  [name] was given information about Medicaid Managed Care team care coordination services as a part of their [facility] Medicaid benefit. Misty [name] verbally consented to engagement with the [facility] Managed Care team.   If you are experiencing a medical emergency, please call 911 or report to your local emergency department or urgent care.   If you have a non-emergency medical problem during routine business hours, please contact your provider's office and ask to speak with a nurse.   For questions related to your [facility] health plan, please call: [phone] or go here:[email or web address]  If you would like to schedule transportation through your [facility] plan, please call the following number at least 2 days in advance of your appointment: [phone].  You can also use the MTM portal or MTM mobile app to manage your rides. For the portal, please go to [email or web address].  Call the [place] at [phone], at any time, 24 hours a day, 7 days a week. If you are in danger or need immediate medical attention call 911.  If you would like help to quit smoking, call 1-800-QUIT-NOW 435-870-5438) OR Espaol: 1-855-Djelo-Ya HD:1601594) o para ms informacin haga clic aqu or Text READY to 200-400 to register via text  [name] - following are the goals we discussed in your visit today:   Goals Addressed             This Visit's Progress    Protect My Health       Timeframe:  Long-Range Goal Priority:  High Start Date:      06/23/20                       Expected End Date:      ongoing                 Follow Up Date: 03/18/22   - schedule appointment for flu shot - schedule appointment for vaccines needed due to my age or health - schedule recommended health tests (blood work, mammogram, colonoscopy, pap  test) - schedule and keep appointment for annual check-up    Why is this important?   Screening tests can find diseases early when they are easier to treat.  Your doctor or nurse will talk with you about which tests are important for you.  Getting shots for common diseases like the flu and shingles will help prevent them.   03/17/22:  Patient saw PCP yesterday, has appt Monday with Psychiatrist   Patient verbalizes understanding of instructions and care plan provided today and agrees to view in [place]. Active MyChart status and patient understanding of how to access instructions and care plan via MyChart confirmed with patient.     The Managed Medicaid care management team will reach out to the patient again over the next 30 business  days.  The  Patient has been provided with contact information for the Managed Medicaid care management team and has been advised to call with any health related questions or concerns.   [name] RN, BSN [place] Management Coordinator - Managed Medicaid High Risk [phone]   Following is a copy of your plan of care:  Care Plan : General Plan of Care (Adult)  Updates made by [name], RN since 03/17/2022 12:00 AM     Problem: Health Promotion or Disease Self-Management (  General Plan of Care)   Priority: High  Onset Date: 01/19/2020     Long-Range Goal: Self-Management Plan Developed   Start Date: 01/19/2020  Expected End Date: 06/17/2022  Recent Progress: Not on track  Priority: High  Note:   Current Barriers:  Knowledge Deficits related to medications. Chronic Disease Management support and education needs. Patient with DM2, HTN, tobacco use, anxiety, chronic pain, headaches. Patient given 1-800-quit now phone number. 03/17/22:  Patient states she is out of her meds-cannot afford-is not working right now,  Patient to contact pharmacy about waiving fees and if difficulty to let me know.  Patient with no  glucose meter-to contact PCP today to order-just saw PCP yesterday.  Patient recently seen at [facility] and has Psychiatry appointment on Monday.  Continues to smoke 1/2 ppd.  Pharmacy appt scheduled.  Nurse Case Manager Clinical Goal(s):  Over the next 90 days, patient will attend all scheduled medical appointments: Over the next 90 days, patient will work with CM team pharmacist to review medications. Over the next 90 days, patient will work with Psychiatry to address anxiety. Over the next 30 days, patient will schedule  an appointment  with a PCP and get new meter. Over the next 30 days, patient will take all of her medications as directed.  Interventions:  Inter-disciplinary care team collaboration (see longitudinal plan of care) Evaluation of current treatment plan and patient's adherence to plan as established by provider. Reviewed medications with patient. Collaborated with pharmacy and social work. Discussed plans with patient for ongoing care management follow up and provided patient with direct contact information for care management team Reviewed scheduled/upcoming provider appointments Collaborated with BSW for Podiatry provider  and eye provider, utility resources BSW referral for Podiatrist and eye provider, utility resources-completed Social Work referral for anxiety, resources for light bill-completed. Care Guide referral for PCP resources-completed. Collaborated with Care Guide for resources LCSW referral for anxiety/depression-patient requests a Psychiatry referral-completed. Collaborated with LCSW Collaborated with Pharmacy Pharmacy referral for help with medicaitons  Hypertension Interventions:  (Status:  New goal.) Long Term Goal Last practice recorded BP readings:  BP Readings from Last 3 Encounters:  01/18/22 137/74  01/12/22 (!) 168/100  11/08/20 (!) 146/83   Most recent eGFR/CrCl:  Lab Results  Component Value Date   EGFR 110 01/12/2022    No components  found for: "CRCL"  Advised patient, providing education and rationale, to monitor blood pressure daily and record, calling PCP for findings outside established parameters Reviewed scheduled/upcoming provider appointments Discussed complications of poorly controlled blood pressure such as heart disease, stroke, circulatory complications, vision complications, kidney impairment, sexual dysfunction Assessed social determinant of health barriers  Pain Interventions:  (Status:  New goal.) Long Term Goal Pain assessment performed Medications reviewed Reviewed provider established plan for pain management Discussed importance of adherence to all scheduled medical appointments Counseled on the importance of reporting any/all new or changed pain symptoms or management strategies to pain management provider Advised patient to report to care team affect of pain on daily activities Discussed use of relaxation techniques and/or diversional activities to assist with pain reduction (distraction, imagery, relaxation, massage, acupressure, TENS, heat, and cold application Reviewed with patient prescribed pharmacological and nonpharmacological pain relief strategies Screening for signs and symptoms of depression related to chronic disease state  Assessed social determinant of health barriers   Diabetes Interventions:  (Status:  New goal.) Long Term Goal Assessed patient's understanding of A1c goal: <7% Reviewed medications with patient and discussed importance of medication  adherence Counseled on importance of regular laboratory monitoring as prescribed Discussed plans with patient for ongoing care management follow up and provided patient with direct contact information for care management team Reviewed scheduled/upcoming provider appointments  Advised patient, providing education and rationale, to check cbg as directed  and record, calling provider  for findings outside established parameters Referral made  to social work team for assistance with anxiety/depression/appt Review of patient status, including review of consultants reports, relevant laboratory and other test results, and medications completed Screening for signs and symptoms of depression related to chronic disease state  Assessed social determinant of health barriers Lab Results  Component Value Date   HGBA1C 9.9 (A) 09/18/2018       HGBA1C                                                       9,9                                   12/07/22  Hypertension Interventions:  (Status:  New goal.) Long Term Goal Last practice recorded BP readings:  BP Readings from Last 3 Encounters:  01/18/22 137/74  01/12/22 (!) 168/100  11/08/20 (!) 146/83   Most recent eGFR/CrCl:  Lab Results  Component Value Date   EGFR 110 01/12/2022    No components found for: "CRCL"  Evaluation of current treatment plan related to hypertension self management and patient's adherence to plan as established by provider Reviewed medications with patient and discussed importance of compliance Discussed plans with patient for ongoing care management follow up and provided patient with direct contact information for care management team Advised patient, providing education and rationale, to monitor blood pressure daily and record, calling PCP for findings outside established parameters Reviewed scheduled/upcoming provider appointments including:  Screening for signs and symptoms of depression related to chronic disease state  Assessed social determinant of health barriers   Patient Goals/Self-Care Activities Over the next 30 days, patient will:  -Attends all scheduled provider appointments Calls pharmacy for medication refills Calls provider office for new concerns or questions Obtain new CBG meter and check blood sugars Take all medications  Follow Up Plan: The Managed Medicaid care management team will reach out to the patient again over the next 30  business days.  The patient has been provided with contact information for the Managed Medicaid care management team and has been advised to call with any health related questions or concerns.
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
## 2022-03-22 ENCOUNTER — Ambulatory Visit: Payer: Medicaid Other

## 2022-03-23 ENCOUNTER — Other Ambulatory Visit: Payer: Medicaid Other | Admitting: Pharmacist

## 2022-03-23 NOTE — Progress Notes (Signed)
03/23/2022 Name: Misty [name] MRN: [id number] DOB: 1980/01/20  Chief Complaint  Patient presents with   Medication Management   Diabetes   Hyperlipidemia    Misty [name] is a 42 y.o. year old female who presented for a telephone visit.   They were referred to the pharmacist by their Case Management Team  for assistance in managing complex medication management.   Patient is participating in a Managed Medicaid Plan:  Yes  Subjective:  Care Team: Primary Care Provider: [facility] [place] ; Next Scheduled Visit: next week  Medication Access/Adherence  Current Pharmacy:  [facility] [place], [place] - [address] [address] Phone: [phone] Fax: [place], [place] - [address] [place] [address] Phone: [phone] Fax: [phone]   Patient reports affordability concerns with their medications: No  Patient reports access/transportation concerns to their pharmacy: No  Patient reports adherence concerns with their medications:  No    Reports that she asked [facility] about a charge account and they were able to waive copays.    Diabetes:  Current medications: Lantus 10 units daily, metformin 500 mg twice daily, Trulicity [id number] mg weekly - however, patient has not had Trulicity for about 2 months due to back orders.   Current glucose readings: has not been checking in the past few weeks as she knows it is going to be high  Reports her brother told her metformin led to his renal dysfunction and going on dialysis.   Hypertension:  Current medications: diltiazem 180 mg daily, lisinopril/HCTZ 10/12.5 mg daily  Patient does not have a validated, automated, upper arm home BP cuff   Hyperlipidemia/ASCVD Risk Reduction  Current lipid lowering medications: simvastatin  20 mg - though notes she doesn't take this consistently.    Objective:  Lab Results  Component Value Date   HGBA1C 9.9 (A) 09/18/2018    Lab Results  Component Value Date   CREATININE 0.70 01/12/2022   BUN 7 01/12/2022   NA 140 01/12/2022   K 4.0 01/12/2022   CL 104 01/12/2022   CO2 23 01/12/2022    Lab Results  Component Value Date   CHOL 186 08/12/2014   HDL 29 (L) 08/12/2014   LDLCALC 117 (H) 08/12/2014   TRIG 202 (H) 08/12/2014   CHOLHDL 6.4 (H) 08/12/2014    Medications Reviewed Today     Reviewed by Osker Mason, RPH-CPP (Pharmacist) on 03/23/22 at 1554  Med List Status: <None>   Medication Order Taking? Sig Documenting Provider Last Dose Status Informant  Accu-Chek FastClix Lancets MISC [id number] Yes Use twice per day to check your blood glucose.  E11.65 Jose Persia, MD Taking Active Self  diclofenac sodium (VOLTAREN) 1 % GEL [id number]  Apply 2 g topically 4 (four) times daily.  Patient not taking: Reported on 03/17/2022   Caccavale, Sophia, PA-C  Active   diltiazem (CARDIZEM CD) 180 MG 24 hr capsule [id number] Yes Take 180 mg by mouth daily. [provider] Taking Active   Dulaglutide (TRULICITY) A999333 0000000 SOPN [id number] No Inject into the skin.  Patient not taking: Reported on 03/23/2022   [provider] Not Taking Active   glucose blood (ACCU-CHEK GUIDE) test strip [id number] Yes Use one strip, twice daily to check your blood glucose.  E11.65 Jose Persia, MD Taking Active Self  LANTUS SOLOSTAR 100 UNIT/ML Solostar  Pen [id number] Yes Inject 10 Units into the skin daily. [provider] Taking Active   lisinopril-hydrochlorothiazide (ZESTORETIC) 10-12.5 MG tablet [id number] Yes Take 1 tablet by mouth daily. [provider] Taking Active   metFORMIN (GLUCOPHAGE) 500 MG tablet [id number] Yes Take 500 mg by mouth 2 (two) times daily. [provider] Taking Active   pregabalin (LYRICA) 75 MG capsule [id number]  Yes Take 75 mg by mouth 2 (two) times daily. [provider] Taking Active Self  simvastatin (ZOCOR) 20 MG tablet [id number] No Take 20 mg by mouth daily.  Patient not taking: Reported on 03/23/2022   [provider] Not Taking Active   Vitamin D, Ergocalciferol, (DRISDOL) 1.25 MG (50000 UNIT) CAPS capsule [id number] Yes Take 50,000 Units by mouth once a week. [provider] Taking Active               Assessment/Plan:   Diabetes: - Currently uncontrolled - Reviewed long term cardiovascular and renal outcomes of uncontrolled blood sugar - Reviewed goal A1c, goal fasting, and goal 2 hour post prandial glucose - Recommend to discuss changing to Ozempic with PCP, as patient sees her next week. - Discussed that metformin does not cause renal dysfunction, but is renally cleared. Discussed that uncontrolled DM and HTN are more likely to cause renal damage.  - Recommend to check glucose twice daily, fasting and 2 hour post prandial  Hypertension: - Currently uncontrolled - Reviewed long term cardiovascular and renal outcomes of uncontrolled blood pressure. Patient will call her insurance to order a BP cuff - Recommend to continue current regimen  Hyperlipidemia/ASCVD Risk Reduction: - Currently uncontrolled, additionally drug interaction between simvastatin and diltiazem. Encouraged her to discuss with PCP to switch to rosuvastatin.   Follow Up Plan: phone call in 4 weeks  Catie TJodi Mourning, PharmD, [place], [facility] [place] [facility] [phone]

## 2022-03-23 NOTE — Patient Instructions (Addendum)
Emmi,   It was great talking to you today!  Call your [facility] customer service number to order an automatic, upper arm blood pressure cuff.    Talk to your primary care provider about:  1) Sending prescription for Ozempic instead of Trulicity, since Trulicity is on back order right now.  2) There is a drug interaction between diltiazem and simvastatin. I recommend we change to a different statin, like rosuvastatin.   Talk to your primary care provider or the new psychiatrist about:  1) Restarting duloxetine- this can help with your mental health and your pain  Check your blood pressure twice weekly, and any time you have concerning symptoms like headache, chest pain, dizziness, shortness of breath, or vision changes.   Our goal is less than 130/80.  To appropriately check your blood pressure, make sure you do the following:  1) Avoid caffeine, exercise, or tobacco products for 30 minutes before checking. Empty your bladder. 2) Sit with your back supported in a flat-backed chair. Rest your arm on something flat (arm of the chair, table, etc). 3) Sit still with your feet flat on the floor, resting, for at least 5 minutes.  4) Check your blood pressure. Take 1-2 readings.  5) Write down these readings and bring with you to any provider appointments.  Bring your home blood pressure machine with you to a provider's office for accuracy comparison at least once a year.   Make sure you take your blood pressure medications before you come to any office visit, even if you were asked to fast for labs.   Take care!  Catie Hedwig Morton, PharmD, [place], [facility] [phone]

## 2022-03-31 ENCOUNTER — Ambulatory Visit: Payer: Medicaid Other | Attending: Cardiology

## 2022-03-31 ENCOUNTER — Other Ambulatory Visit: Payer: Medicaid Other | Admitting: Licensed Clinical Social Worker

## 2022-03-31 DIAGNOSIS — Z0000 Encounter for general adult medical examination without abnormal findings: Secondary | ICD-10-CM

## 2022-03-31 DIAGNOSIS — I2510 Atherosclerotic heart disease of native coronary artery without angina pectoris: Secondary | ICD-10-CM

## 2022-03-31 DIAGNOSIS — I2583 Coronary atherosclerosis due to lipid rich plaque: Secondary | ICD-10-CM | POA: Diagnosis not present

## 2022-03-31 NOTE — Patient Instructions (Signed)
Visit Information  [name] was given information about Medicaid Managed Care team care coordination services as a part of their Wellcare Medicaid benefit. Misty [name] verbally consented to engagement with the Medicaid Managed Care team.   If you are experiencing a medical emergency, please call 911 or report to your local emergency department or urgent care.   If you have a non-emergency medical problem during routine business hours, please contact your provider's office and ask to speak with a nurse.   For questions related to your Wellcare Medicaid health plan, please call: [phone] or go here:[email or web address]  If you would like to schedule transportation through your Wellcare Medicaid plan, please call the following number at least 2 days in advance of your appointment: [phone].  You can also use the MTM portal or MTM mobile app to manage your rides. For the portal, please go to mtm.mtmlink.net.  Call the Behavioral Health Crisis Line at 1-833-207-4240, at any time, 24 hours a day, 7 days a week. If you are in danger or need immediate medical attention call 911.  If you would like help to quit smoking, call 1-800-QUIT-NOW (1-800-784-8669) OR Espaol: 1-855-Djelo-Ya (1-855-335-3569) o para ms informacin haga clic aqu or Text READY to 200-400 to register via text  Barbara Ahart, BSW, MSW, LCSW Managed Medicaid LCSW [place]  [facility] [email or web address] Phone: [phone]   
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
## 2022-04-01 LAB — LIPID PANEL
Chol/HDL Ratio: 5.9 ratio — ABNORMAL HIGH (ref 0.0–4.4)
Cholesterol, Total: 206 mg/dL — ABNORMAL HIGH (ref 100–199)
HDL: 35 mg/dL — ABNORMAL LOW (ref >39)
LDL Chol Calc (NIH): 146 mg/dL — ABNORMAL HIGH (ref 0–99)
Triglycerides: 139 mg/dL (ref 0–149)
VLDL Cholesterol Cal: 25 mg/dL (ref 5–40)

## 2022-04-03 DIAGNOSIS — Z419 Encounter for procedure for purposes other than remedying health state, unspecified: Secondary | ICD-10-CM | POA: Diagnosis not present

## 2022-04-06 DIAGNOSIS — Z794 Long term (current) use of insulin: Secondary | ICD-10-CM | POA: Diagnosis not present

## 2022-04-06 DIAGNOSIS — Z79899 Other long term (current) drug therapy: Secondary | ICD-10-CM | POA: Diagnosis not present

## 2022-04-06 DIAGNOSIS — N914 Secondary oligomenorrhea: Secondary | ICD-10-CM | POA: Diagnosis not present

## 2022-04-06 DIAGNOSIS — R7309 Other abnormal glucose: Secondary | ICD-10-CM | POA: Diagnosis not present

## 2022-04-06 DIAGNOSIS — R030 Elevated blood-pressure reading, without diagnosis of hypertension: Secondary | ICD-10-CM | POA: Diagnosis not present

## 2022-04-06 DIAGNOSIS — E119 Type 2 diabetes mellitus without complications: Secondary | ICD-10-CM | POA: Diagnosis not present

## 2022-04-06 DIAGNOSIS — Z6834 Body mass index (BMI) 34.0-34.9, adult: Secondary | ICD-10-CM | POA: Diagnosis not present

## 2022-04-06 DIAGNOSIS — M545 Low back pain, unspecified: Secondary | ICD-10-CM | POA: Diagnosis not present

## 2022-04-07 ENCOUNTER — Encounter (HOSPITAL_COMMUNITY): Payer: Self-pay

## 2022-04-07 ENCOUNTER — Ambulatory Visit (HOSPITAL_COMMUNITY): Payer: Medicaid Other | Admitting: Physician Assistant

## 2022-04-07 ENCOUNTER — Telehealth: Payer: Self-pay | Admitting: *Deleted

## 2022-04-07 MED ORDER — DILTIAZEM HCL ER COATED BEADS 360 MG PO CP24: 360.0000 mg | ORAL_CAPSULE | Freq: Every day | ORAL | 3 refills | Status: DC

## 2022-04-07 MED ORDER — ROSUVASTATIN CALCIUM 5 MG PO TABS: 5.0000 mg | ORAL_TABLET | Freq: Every day | ORAL | 3 refills | Status: DC

## 2022-04-07 NOTE — Telephone Encounter (Signed)
-----   Message from [name], MD sent at 04/04/2022  9:07 PM EDT ----- LDL 146 Could consider low dose Crestor 5mg  PO once a day to help reduce cholesterol. Donato Schultz, MD

## 2022-04-10 DIAGNOSIS — Z79899 Other long term (current) drug therapy: Secondary | ICD-10-CM | POA: Diagnosis not present

## 2022-04-12 ENCOUNTER — Other Ambulatory Visit: Payer: Medicaid Other | Admitting: Licensed Clinical Social Worker

## 2022-04-12 NOTE — Patient Outreach (Signed)
  Medicaid Managed Care   Unsuccessful Attempt Note   04/12/2022 Name: Misty [name] MRN: [id number] DOB: 09-21-1980  Referred by: Center, [facility] Reason for referral : No chief complaint on file.   An unsuccessful telephone outreach was attempted today. The patient was referred to the case management team for assistance with care management and care coordination.    Follow Up Plan: A HIPAA compliant phone message was left for the patient providing contact information and requesting a return call. and The Managed Medicaid care management team will reach out to the patient again over the next 30 days.   Dickie La, BSW, MSW, [employer] Managed Medicaid LCSW [facility]  [facility] [email or web address] Phone: [phone]

## 2022-04-13 ENCOUNTER — Telehealth: Payer: Self-pay | Admitting: Licensed Clinical Social Worker

## 2022-04-13 NOTE — Patient Outreach (Signed)
Care Coordination  04/13/2022  Misty [name] [birth year] [id number]  Patient left a message with [facility] LCSW for rescheduling no show appointment yesterday. Appointment was rescheduled successfully for 04/24/22 at 11 am.  [facility] LCSW notified [facility] Scheduling Care Guide [name] through route of note as [name] LCSW asked for her to reschedule appointment yesterday after making two unsuccessful outreach attempts and an email.   Dickie La, BSW, MSW, [employer] Managed Medicaid LCSW [facility]  [facility] [email or web address] Phone: [phone]

## 2022-04-17 ENCOUNTER — Ambulatory Visit (HOSPITAL_COMMUNITY): Payer: Medicaid Other | Admitting: Clinical

## 2022-04-18 ENCOUNTER — Other Ambulatory Visit: Payer: Medicaid Other | Admitting: Obstetrics and Gynecology

## 2022-04-18 NOTE — Patient Instructions (Signed)
Hi [name], I am very sorry to miss you today, I hope you are doing okay- as a part of your Medicaid benefit, you are eligible for care management and care coordination services at no cost or copay. I was unable to reach you by phone today but would be happy to help you with your health related needs. Please feel free to call me at [phone]  A member of the Managed Medicaid care management team will reach out to you again over the next 30 business  days.   Kathi Der RN, BSN [place]  [facility] - Managed Medicaid High Risk [phone]

## 2022-04-18 NOTE — Patient Outreach (Signed)
  Medicaid Managed Care   Unsuccessful Attempt Note   04/18/2022 Name: Misty [name] MRN: [id number] DOB: 24-Jul-1980  Referred by: Center, [facility] Reason for referral : High Risk Managed Medicaid (Unsuccessful telephone outreach)  An unsuccessful telephone outreach was attempted today. The patient was referred to the case management team for assistance with care management and care coordination.    Follow Up Plan: The Managed Medicaid care management team will reach out to the patient again over the next 30 business  days. and The  Patient has been provided with contact information for the Managed Medicaid care management team and has been advised to call with any health related questions or concerns.   Kathi Der RN, BSN [place]  [facility] - Managed Medicaid High Risk [phone]

## 2022-04-24 ENCOUNTER — Ambulatory Visit: Payer: Medicaid Other | Admitting: Licensed Clinical Social Worker

## 2022-04-24 ENCOUNTER — Encounter: Payer: Self-pay | Admitting: Obstetrics and Gynecology

## 2022-04-24 ENCOUNTER — Other Ambulatory Visit: Payer: Medicaid Other | Admitting: Obstetrics and Gynecology

## 2022-04-24 NOTE — Patient Outreach (Signed)
Medicaid Managed Care   Nurse Care Manager Note  04/24/2022 Name:  Misty [name] MRN:  [id number] DOB:  07-Aug-1980  Misty [name] is an 42 y.o. year old female who is a primary patient of Center, Engineer, maintenance (IT).  The [facility] Managed Care Coordination team was consulted for assistance with:    Chronic healthcare management needs, anxiety, tobacco use, DM, chronic pain, HTN, H/A, GERD  Misty [name] was given information about Medicaid Managed Care Coordination team services today. Misty [name] Patient agreed to services and verbal consent obtained.  Engaged with patient by telephone for follow up visit in response to provider referral for case management and/or care coordination services.   Assessments/Interventions:  Review of past medical history, allergies, medications, health status, including review of consultants reports, laboratory and other test data, was performed as part of comprehensive evaluation and provision of chronic care management services.  SDOH (Social Determinants of Health) assessments and interventions performed: SDOH Interventions    Flowsheet Row Patient Outreach Telephone from 03/31/2022 in [place] POPULATION HEALTH DEPARTMENT Patient Outreach Telephone from 03/17/2022 in [place] POPULATION HEALTH DEPARTMENT Patient Outreach Telephone from 03/01/2022 in [place] POPULATION HEALTH DEPARTMENT Patient Outreach Telephone from 02/15/2022 in [place] POPULATION HEALTH DEPARTMENT Patient Outreach Telephone from 02/06/2022 in [place] POPULATION HEALTH DEPARTMENT Patient Outreach Telephone from 01/25/2022 in [place] POPULATION HEALTH DEPARTMENT  SDOH Interventions        Utilities Interventions -- Intervention Not Indicated -- -- -- --  Alcohol Usage Interventions -- Intervention Not Indicated (Score <7) -- -- -- --  Depression Interventions/Treatment  -- -- -- -- -- Counseling, Referral to Psychiatry  Stress  Interventions Offered [employer], Provide Counseling -- Offered [employer] Resources, Provide Counseling Offered [facility] Well[facility] Resources, Provide Counseling Offered Community Wellness Resources, Provide Counseling Offered Community Wellness Resources, Provide Counseling     Care Plan  No Known Allergies  Medications Reviewed Today     Reviewed by [name], RN (Registered Nurse) on 04/24/22 at 1325  Med List Status: <None>   Medication Order Taking? Sig Documenting Provider Last Dose Status Informant  Accu-Chek FastClix Lancets MISC [id number] No Use twice per day to check your blood glucose.  E11.65 [name], MD Taking Active Self  diclofenac sodium (VOLTAREN) 1 % GEL [id number] No Apply 2 g topically 4 (four) times daily.  Patient not taking: Reported on 03/17/2022   [name], PA-C Not Taking Active   diltiazem (CARDIZEM CD) 360 MG 24 hr capsule [id number]  Take 1 capsule (360 mg total) by mouth daily. [name], MD  Active   Dulaglutide (TRULICITY) 0.75 MG/0.5ML [name] [id number] No Inject into the skin.  Patient not taking: Reported on 03/23/2022   [provider] Not Taking Active   glucose blood (ACCU-CHEK GUIDE) test strip [id number] No Use one strip, twice daily to check your blood glucose.  E11.65 [name], MD Taking Active Self  LANTUS SOLOSTAR 100 UNIT/ML Solostar Pen [id number] No Inject 10 Units into the skin daily. [provider] Taking Active   lisinopril-hydrochlorothiazide (ZESTORETIC) 10-12.5 MG tablet [id number] No Take 1 tablet by mouth daily. [provider] Taking Active   metFORMIN (GLUCOPHAGE) 500 MG tablet [id number] No Take 500 mg by mouth 2 (two) times daily. [provider] Taking Active   pregabalin (LYRICA) 75 MG capsule [id number] No Take 75 mg by mouth 2 (two) times daily. [provider] Taking Active Self  rosuvastatin (CRESTOR) 5 MG tablet [id number]  Take 1  tablet (5 mg total) by mouth daily. [name], MD  Active   Vitamin D, Ergocalciferol, (DRISDOL) 1.25 MG (50000 UNIT) CAPS capsule [id number] No Take 50,000 Units by mouth once a week. [provider] Taking Active            Patient Active Problem List   Diagnosis Date Noted   Myofascial pain dysfunction syndrome 11/25/2018   Nerve pain 11/25/2018   Hypertension 09/19/2018   Insomnia 09/19/2018   Hematuria 09/19/2018   Left ankle pain 06/25/2018   Lumbar spine pain 06/25/2018   Thoracic spine pain 06/25/2018   Cervical spine pain 06/25/2018   Hidradenitis suppurativa 01/18/2018   History of diverticulitis 10/03/2017   Constipation 10/03/2017   Regional enteritis 08/17/2017   Morbid obesity 08/17/2017   Hypokalemia 08/17/2017   Headache 02/28/2017   Heart palpitations 06/04/2012   Tobacco use 06/03/2012   Preventative health care 05/03/2011   Diabetes type 2, uncontrolled 02/24/2008   Polycystic ovarian disease 07/27/2006   GERD 03/02/2006   Conditions to be addressed/monitored per PCP order:  Chronic healthcare management needs, anxiety, tobacco use, DM, chronic pain, HTN, H/A, GERD  Care Plan : General Plan of Care (Adult)  Updates made by [name], RN since 04/24/2022 12:00 AM     Problem: Health Promotion or Disease Self-Management (General Plan of Care)   Priority: High  Onset Date: 01/19/2020     Long-Range Goal: Self-Management Plan Developed   Start Date: 01/19/2020  Expected End Date: 06/17/2022  Recent Progress: Not on track  Priority: High  Note:   Current Barriers:  Knowledge Deficits related to medications. Chronic Disease Management support and education needs. Patient with DM2, HTN, tobacco use, anxiety, chronic pain, headaches. Patient given 1-800-quit now phone number. 04/24/22:  Patient with [facility] appt tomorrow, has LCSW and Pharmacy appt scheduled.  No complaints today.  Does not check BP or BG-has not followed up on meter.  Smokes  1/2 ppd.  Nurse Case Manager Clinical Goal(s):  Over the next 90 days, patient will attend all scheduled medical appointments: Over the next 90 days, patient will work with CM team pharmacist to review medications. Over the next 90 days, patient will work with Psychiatry to address anxiety. Over the next 30 days, patient will schedule  an appointment  with a PCP and get new meter. Over the next 30 days, patient will take all of her medications as directed.  Interventions:  Inter-disciplinary care team collaboration (see longitudinal plan of care) Evaluation of current treatment plan and patient's adherence to plan as established by provider. Reviewed medications with patient. Collaborated with pharmacy and social work. Discussed plans with patient for ongoing care management follow up and provided patient with direct contact information for care management team Reviewed scheduled/upcoming provider appointments Collaborated with BSW for Podiatry provider  and eye provider, utility resources BSW referral for Podiatrist and eye provider, utility resources-completed Social Work referral for anxiety, resources for light bill-completed. Care Guide referral for PCP resources-completed. Collaborated with Care Guide for resources LCSW referral for anxiety/depression-patient requests a Psychiatry referral-completed. Collaborated with LCSW Collaborated with Pharmacy Pharmacy referral for help with medications  Hypertension Interventions:  (Status:  New goal.) Long Term Goal Last practice recorded BP readings:  BP Readings from Last 3 Encounters:  01/18/22 137/74  01/12/22 (!) 168/100  11/08/20 (!) 146/83   Most recent eGFR/CrCl:  Lab Results  Component Value Date   EGFR 110 01/12/2022    No components found  for: "CRCL"  Advised patient, providing education and rationale, to monitor blood pressure daily and record, calling PCP for findings outside established parameters Reviewed  scheduled/upcoming provider appointments Discussed complications of poorly controlled blood pressure such as heart disease, stroke, circulatory complications, vision complications, kidney impairment, sexual dysfunction Assessed social determinant of health barriers  Pain Interventions:  (Status:  New goal.) Long Term Goal Pain assessment performed Medications reviewed Reviewed provider established plan for pain management Discussed importance of adherence to all scheduled medical appointments Counseled on the importance of reporting any/all new or changed pain symptoms or management strategies to pain management provider Advised patient to report to care team affect of pain on daily activities Discussed use of relaxation techniques and/or diversional activities to assist with pain reduction (distraction, imagery, relaxation, massage, acupressure, TENS, heat, and cold application Reviewed with patient prescribed pharmacological and nonpharmacological pain relief strategies Screening for signs and symptoms of depression related to chronic disease state  Assessed social determinant of health barriers   Diabetes Interventions:  (Status:  New goal.) Long Term Goal Assessed patient's understanding of A1c goal: <7% Reviewed medications with patient and discussed importance of medication adherence Counseled on importance of regular laboratory monitoring as prescribed Discussed plans with patient for ongoing care management follow up and provided patient with direct contact information for care management team Reviewed scheduled/upcoming provider appointments  Advised patient, providing education and rationale, to check cbg as directed  and record, calling provider  for findings outside established parameters Referral made to social work team for assistance with anxiety/depression/appt Review of patient status, including review of consultants reports, relevant laboratory and other test results, and  medications completed Screening for signs and symptoms of depression related to chronic disease state  Assessed social determinant of health barriers Lab Results  Component Value Date   HGBA1C 9.9 (A) 09/18/2018       HGBA1C                                                       9,9                                   12/07/22  Hypertension Interventions:  (Status:  New goal.) Long Term Goal Last practice recorded BP readings:  BP Readings from Last 3 Encounters:  01/18/22 137/74  01/12/22 (!) 168/100  11/08/20 (!) 146/83   Most recent eGFR/CrCl:  Lab Results  Component Value Date   EGFR 110 01/12/2022    No components found for: "CRCL"  Evaluation of current treatment plan related to hypertension self management and patient's adherence to plan as established by provider Reviewed medications with patient and discussed importance of compliance Discussed plans with patient for ongoing care management follow up and provided patient with direct contact information for care management team Advised patient, providing education and rationale, to monitor blood pressure daily and record, calling PCP for findings outside established parameters Reviewed scheduled/upcoming provider appointments including:  Screening for signs and symptoms of depression related to chronic disease state  Assessed social determinant of health barriers   Patient Goals/Self-Care Activities Over the next 30 days, patient will:  -Attends all scheduled provider appointments Calls pharmacy for medication refills Calls provider office for new concerns or questions Obtain new  CBG meter and check blood sugars Take all medications  Follow Up Plan: The Managed Medicaid care management team will reach out to the patient again over the next 30 business days.  The patient has been provided with contact information for the Managed Medicaid care management team and has been advised to call with any health related questions or  concerns.    Follow Up:  Patient agrees to Care Plan and Follow-up.  Plan: The Managed Medicaid care management team will reach out to the patient again over the next 30 business  days. and The  Patient has been provided with contact information for the Managed Medicaid care management team and has been advised to call with any health related questions or concerns.  Date/time of next scheduled RN care management/care coordination outreach: 05/24/22 at 1230

## 2022-04-24 NOTE — Patient Instructions (Signed)
Hi [name], nice to speak with you-have a good afternoon and week.  [name] was given information about Medicaid Managed Care team care coordination services as a part of their [facility] Medicaid benefit. Misty [name] verbally consented to engagement with the [facility] Managed Care team.   If you are experiencing a medical emergency, please call 911 or report to your local emergency department or urgent care.   If you have a non-emergency medical problem during routine business hours, please contact your provider's office and ask to speak with a nurse.   For questions related to your [facility] health plan, please call: [phone] or go here:[email or web address]  If you would like to schedule transportation through your [facility] plan, please call the following number at least 2 days in advance of your appointment: [phone].  You can also use the MTM portal or MTM mobile app to manage your rides. For the portal, please go to [email or web address]  Call the [facility] Crisis Line at (431)786-8368, at any time, 24 hours a day, 7 days a week. If you are in danger or need immediate medical attention call 911.  If you would like help to quit smoking, call 1-800-QUIT-NOW (619-861-2345) OR Espaol: 1-855-Djelo-Ya (4-132-440-1027) o para ms informacin haga clic aqu or Text READY to 253-664 to register via text  [name] - following are the goals we discussed in your visit today:   Goals Addressed             This Visit's Progress    Protect My Health       Timeframe:  Long-Range Goal Priority:  High Start Date:      06/23/20                       Expected End Date:      ongoing                 Follow Up Date: 05/24/22   - schedule appointment for flu shot - schedule appointment for vaccines needed due to my age or health - schedule recommended health tests (blood work, mammogram, colonoscopy, pap test) -  schedule and keep appointment for annual check-up    Why is this important?   Screening tests can find diseases early when they are easier to treat.  Your doctor or nurse will talk with you about which tests are important for you.  Getting shots for common diseases like the flu and shingles will help prevent them.   04/24/22:  patient being [facility] at [facility]   Patient verbalizes understanding of instructions and care plan provided today and agrees to view in MyChart. Active MyChart status and patient understanding of how to access instructions and care plan via MyChart confirmed with patient.     The Managed Medicaid care management team will reach out to the patient again over the next 30 business  days.  The  Patient  has been provided with contact information for the Managed Medicaid care management team and has been advised to call with any health related questions or concerns.   Misty Der RN, BSN [place]  [facility] Care Management Coordinator - Managed Medicaid High Risk [phone]   Following is a copy of your plan of care:  Care Plan : General Plan of Care (Adult)  Updates made by Misty Chandler, RN since 04/24/2022 12:00 AM     Problem: Health Promotion or Disease Self-Management (General Plan of Care)  Priority: High  Onset Date: 01/19/2020     Long-Range Goal: Self-Management Plan Developed   Start Date: 01/19/2020  Expected End Date: 06/17/2022  Recent Progress: Not on track  Priority: High  Note:   Current Barriers:  Knowledge Deficits related to medications. Chronic Disease Management support and education needs. Patient with DM2, HTN, tobacco use, anxiety, chronic pain, headaches. Patient given 1-800-quit now phone number. 04/24/22:  Patient with [facility] appt tomorrow, has LCSW and Pharmacy appt scheduled.  No complaints today.  Does not check BP or BG-has not followed up on meter.  Smokes 1/2 ppd.  Nurse Case Manager Clinical Goal(s):  Over the  next 90 days, patient will attend all scheduled medical appointments: Over the next 90 days, patient will work with CM team pharmacist to review medications. Over the next 90 days, patient will work with Psychiatry to address anxiety. Over the next 30 days, patient will schedule  an appointment  with a PCP and get new meter. Over the next 30 days, patient will take all of her medications as directed.  Interventions:  Inter-disciplinary care team collaboration (see longitudinal plan of care) Evaluation of current treatment plan and patient's adherence to plan as established by provider. Reviewed medications with patient. Collaborated with pharmacy and social work. Discussed plans with patient for ongoing care management follow up and provided patient with direct contact information for care management team Reviewed scheduled/upcoming provider appointments Collaborated with BSW for Podiatry provider  and eye provider, utility resources BSW referral for Podiatrist and eye provider, utility resources-completed Social Work referral for anxiety, resources for light bill-completed. Care Guide referral for PCP resources-completed. Collaborated with Care Guide for resources LCSW referral for anxiety/depression-patient requests a Psychiatry referral-completed. Collaborated with LCSW Collaborated with Pharmacy Pharmacy referral for help with medications  Hypertension Interventions:  (Status:  New goal.) Long Term Goal Last practice recorded BP readings:  BP Readings from Last 3 Encounters:  01/18/22 137/74  01/12/22 (!) 168/100  11/08/20 (!) 146/83   Most recent eGFR/CrCl:  Lab Results  Component Value Date   EGFR 110 01/12/2022    No components found for: "CRCL"  Advised patient, providing education and rationale, to monitor blood pressure daily and record, calling PCP for findings outside established parameters Reviewed scheduled/upcoming provider appointments Discussed complications of  poorly controlled blood pressure such as heart disease, stroke, circulatory complications, vision complications, kidney impairment, sexual dysfunction Assessed social determinant of health barriers  Pain Interventions:  (Status:  New goal.) Long Term Goal Pain assessment performed Medications reviewed Reviewed provider established plan for pain management Discussed importance of adherence to all scheduled medical appointments Counseled on the importance of reporting any/all new or changed pain symptoms or management strategies to pain management provider Advised patient to report to care team affect of pain on daily activities Discussed use of relaxation techniques and/or diversional activities to assist with pain reduction (distraction, imagery, relaxation, massage, acupressure, TENS, heat, and cold application Reviewed with patient prescribed pharmacological and nonpharmacological pain relief strategies Screening for signs and symptoms of depression related to chronic disease state  Assessed social determinant of health barriers   Diabetes Interventions:  (Status:  New goal.) Long Term Goal Assessed patient's understanding of A1c goal: <7% Reviewed medications with patient and discussed importance of medication adherence Counseled on importance of regular laboratory monitoring as prescribed Discussed plans with patient for ongoing care management follow up and provided patient with direct contact information for care management team Reviewed scheduled/upcoming provider appointments  Advised patient, providing  education and rationale, to check cbg as directed  and record, calling provider  for findings outside established parameters Referral made to social work team for assistance with anxiety/depression/appt Review of patient status, including review of consultants reports, relevant laboratory and other test results, and medications completed Screening for signs and symptoms of depression  related to chronic disease state  Assessed social determinant of health barriers Lab Results  Component Value Date   HGBA1C 9.9 (A) 09/18/2018       HGBA1C                                                       9,9                                   12/07/22  Hypertension Interventions:  (Status:  New goal.) Long Term Goal Last practice recorded BP readings:  BP Readings from Last 3 Encounters:  01/18/22 137/74  01/12/22 (!) 168/100  11/08/20 (!) 146/83   Most recent eGFR/CrCl:  Lab Results  Component Value Date   EGFR 110 01/12/2022    No components found for: "CRCL"  Evaluation of current treatment plan related to hypertension self management and patient's adherence to plan as established by provider Reviewed medications with patient and discussed importance of compliance Discussed plans with patient for ongoing care management follow up and provided patient with direct contact information for care management team Advised patient, providing education and rationale, to monitor blood pressure daily and record, calling PCP for findings outside established parameters Reviewed scheduled/upcoming provider appointments including:  Screening for signs and symptoms of depression related to chronic disease state  Assessed social determinant of health barriers   Patient Goals/Self-Care Activities Over the next 30 days, patient will:  -Attends all scheduled provider appointments Calls pharmacy for medication refills Calls provider office for new concerns or questions Obtain new CBG meter and check blood sugars Take all medications  Follow Up Plan: The Managed Medicaid care management team will reach out to the patient again over the next 30 business days.  The patient has been provided with contact information for the Managed Medicaid care management team and has been advised to call with any health related questions or concerns.

## 2022-04-25 ENCOUNTER — Ambulatory Visit (HOSPITAL_COMMUNITY): Payer: Medicaid Other | Admitting: Physician Assistant

## 2022-04-26 ENCOUNTER — Other Ambulatory Visit: Payer: Medicaid Other | Admitting: Licensed Clinical Social Worker
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
## 2022-04-26 NOTE — Patient Instructions (Signed)
Visit Information  Misty [name] was given information about Medicaid Managed Care team care coordination services as a part of their Wellcare Medicaid benefit. Misty [name] verbally consented to engagement with the Medicaid Managed Care team.   If you are experiencing a medical emergency, please call 911 or report to your local emergency department or urgent care.   If you have a non-emergency medical problem during routine business hours, please contact your provider's office and ask to speak with a nurse.   For questions related to your Wellcare Medicaid health plan, please call: [phone] or go here:[email or web address]  If you would like to schedule transportation through your Wellcare Medicaid plan, please call the following number at least 2 days in advance of your appointment: [phone].  You can also use the MTM portal or MTM mobile app to manage your rides. For the portal, please go to mtm.mtmlink.net.  Call the Behavioral Health Crisis Line at 1-833-207-4240, at any time, 24 hours a day, 7 days a week. If you are in danger or need immediate medical attention call 911.  If you would like help to quit smoking, call 1-800-QUIT-NOW (1-800-784-8669) OR Espaol: 1-855-Djelo-Ya (1-855-335-3569) o para ms informacin haga clic aqu or Text READY to 200-400 to register via text  Lilie Vezina, BSW, MSW, LCSW Managed Medicaid LCSW [place]  [facility] [email or web address] Phone: [phone]   

## 2022-04-27 ENCOUNTER — Other Ambulatory Visit: Payer: Medicaid Other | Admitting: Pharmacist

## 2022-05-02 ENCOUNTER — Ambulatory Visit (HOSPITAL_COMMUNITY): Payer: Medicaid Other | Admitting: Clinical

## 2022-05-03 DIAGNOSIS — Z6834 Body mass index (BMI) 34.0-34.9, adult: Secondary | ICD-10-CM | POA: Diagnosis not present

## 2022-05-03 DIAGNOSIS — R030 Elevated blood-pressure reading, without diagnosis of hypertension: Secondary | ICD-10-CM | POA: Diagnosis not present

## 2022-05-03 DIAGNOSIS — I4719 Other supraventricular tachycardia: Secondary | ICD-10-CM | POA: Diagnosis not present

## 2022-05-03 DIAGNOSIS — Z419 Encounter for procedure for purposes other than remedying health state, unspecified: Secondary | ICD-10-CM | POA: Diagnosis not present

## 2022-05-03 DIAGNOSIS — H6691 Otitis media, unspecified, right ear: Secondary | ICD-10-CM | POA: Diagnosis not present

## 2022-05-03 DIAGNOSIS — Z3200 Encounter for pregnancy test, result unknown: Secondary | ICD-10-CM | POA: Diagnosis not present

## 2022-05-03 DIAGNOSIS — B3731 Acute candidiasis of vulva and vagina: Secondary | ICD-10-CM | POA: Diagnosis not present

## 2022-05-04 ENCOUNTER — Other Ambulatory Visit: Payer: Medicaid Other | Admitting: Pharmacist

## 2022-05-05 DIAGNOSIS — Z794 Long term (current) use of insulin: Secondary | ICD-10-CM | POA: Diagnosis not present

## 2022-05-05 DIAGNOSIS — H6691 Otitis media, unspecified, right ear: Secondary | ICD-10-CM | POA: Diagnosis not present

## 2022-05-05 DIAGNOSIS — N914 Secondary oligomenorrhea: Secondary | ICD-10-CM | POA: Diagnosis not present

## 2022-05-05 DIAGNOSIS — Z6834 Body mass index (BMI) 34.0-34.9, adult: Secondary | ICD-10-CM | POA: Diagnosis not present

## 2022-05-05 DIAGNOSIS — R7309 Other abnormal glucose: Secondary | ICD-10-CM | POA: Diagnosis not present

## 2022-05-05 DIAGNOSIS — R030 Elevated blood-pressure reading, without diagnosis of hypertension: Secondary | ICD-10-CM | POA: Diagnosis not present

## 2022-05-05 DIAGNOSIS — M545 Low back pain, unspecified: Secondary | ICD-10-CM | POA: Diagnosis not present

## 2022-05-05 DIAGNOSIS — E119 Type 2 diabetes mellitus without complications: Secondary | ICD-10-CM | POA: Diagnosis not present

## 2022-05-05 DIAGNOSIS — Z79899 Other long term (current) drug therapy: Secondary | ICD-10-CM | POA: Diagnosis not present

## 2022-05-10 DIAGNOSIS — Z79899 Other long term (current) drug therapy: Secondary | ICD-10-CM | POA: Diagnosis not present

## 2022-05-17 ENCOUNTER — Other Ambulatory Visit: Payer: Medicaid Other | Admitting: Licensed Clinical Social Worker

## 2022-05-17 NOTE — Patient Instructions (Signed)
Prudencio Burly ,   The [facility] Managed Care Team is available to provide assistance to you with your healthcare needs at no cost and as a benefit of your [facility] Health plan. I'm sorry I was unable to reach you today for our scheduled appointment. Our care guide will call you to reschedule our telephone appointment. Please call me at the number below. I am available to be of assistance to you regarding your healthcare needs. .   Thank you,   Dickie La, BSW, MSW, LCSW Managed Medicaid LCSW [facility]  [email or web address] Phone: [phone]

## 2022-05-17 NOTE — Patient Outreach (Addendum)
  Medicaid Managed Care   Unsuccessful Attempt Note   05/17/2022 Name: TRACY BRIGHTWELL MRN: [id number] DOB: [birth year]  Referred by: [facility] Medical Reason for referral : No chief complaint on file.   An unsuccessful telephone outreach was attempted today. The patient was referred to the case management team for assistance with care management and care coordination.    Follow Up Plan: A HIPAA compliant phone message was left for the patient providing contact information and requesting a return call. Email was sent as well by [facility] LCSW. Encounter routed to [facility] Scheduling Guide, [facility] Pharmacist and [facility] RNCM for rescheduling since no return call was made by 05/17/22 and there are future appointments with other [facility] team members.   Dickie La, BSW, MSW, [employer] Managed Medicaid LCSW [facility]  [email or web address] Phone: [phone]

## 2022-05-18 ENCOUNTER — Telehealth: Payer: Self-pay | Admitting: Cardiology

## 2022-05-18 NOTE — Telephone Encounter (Signed)
Did you add a medication? No  If no, reason? Reason for not adding med: Other Just started Crestor  Did you remove a medication? No  Did you increase the dosage of any medication? No  Did you decrease the dosage of any medication? No  Did you refer to a specialist (i.e. lipid clinic, preventive cardiology, endocrinology)? No  Has patient seen plaque report? No

## 2022-05-19 ENCOUNTER — Other Ambulatory Visit: Payer: Medicaid Other | Admitting: Pharmacist

## 2022-05-19 ENCOUNTER — Telehealth: Payer: Self-pay | Admitting: Pharmacist

## 2022-05-19 NOTE — Progress Notes (Unsigned)
Attempted to contact patient for scheduled appointment for medication management. Left HIPAA compliant message for patient to return my call at their convenience.    Catie T. Mickaela Starlin, PharmD, BCACP, CPP [facility] [phone]  

## 2022-05-24 ENCOUNTER — Other Ambulatory Visit: Payer: Medicaid Other | Admitting: Obstetrics and Gynecology

## 2022-05-24 NOTE — Patient Instructions (Signed)
Visit Information  Ms. AHLIYA MUSKA  - as a part of your Medicaid benefit, you are eligible for care management and care coordination services at no cost or copay. I was unable to reach you by phone today but would be happy to help you with your health related needs. Please feel free to call me at [phone].  Kathi Der RN, BSN [place]  [facility] - Managed Medicaid High Risk [phone]

## 2022-05-24 NOTE — Patient Outreach (Cosign Needed)
  Medicaid Managed Care   Unsuccessful Attempt Note   05/24/2022 Name: Misty [name] MRN: [id number] DOB: September 09, 1980  Referred by: [facility], [facility] Medical Reason for referral : High Risk Managed Medicaid (Unsuccessful telephone outreach)  Third unsuccessful telephone outreach was attempted today. The patient was referred to the case management team for assistance with care management and care coordination. The patient's primary care provider has been notified of our unsuccessful attempts to make or maintain contact with the patient. The care management team is pleased to engage with this patient at any time in the future should he/she be interested in assistance from the care management team.    Follow Up Plan: The  Patient has been provided with contact information for the Managed Medicaid care management team and has been advised to call with any health related questions or concerns. and The Managed Medicaid care management team is available to follow up with the patient after provider conversation with the patient regarding recommendation for care management engagement and subsequent re-referral to the care management team.    Kathi Der RN, BSN [place]  [facility] Care Management Coordinator - Managed [place] High Risk [phone]

## 2022-06-02 ENCOUNTER — Ambulatory Visit (HOSPITAL_COMMUNITY): Payer: Medicaid Other | Admitting: Clinical

## 2022-06-02 ENCOUNTER — Encounter (HOSPITAL_COMMUNITY): Payer: Self-pay

## 2022-06-03 DIAGNOSIS — Z419 Encounter for procedure for purposes other than remedying health state, unspecified: Secondary | ICD-10-CM | POA: Diagnosis not present

## 2022-06-06 DIAGNOSIS — H6691 Otitis media, unspecified, right ear: Secondary | ICD-10-CM | POA: Diagnosis not present

## 2022-06-06 DIAGNOSIS — N914 Secondary oligomenorrhea: Secondary | ICD-10-CM | POA: Diagnosis not present

## 2022-06-06 DIAGNOSIS — Z794 Long term (current) use of insulin: Secondary | ICD-10-CM | POA: Diagnosis not present

## 2022-06-06 DIAGNOSIS — E119 Type 2 diabetes mellitus without complications: Secondary | ICD-10-CM | POA: Diagnosis not present

## 2022-06-06 DIAGNOSIS — Z6833 Body mass index (BMI) 33.0-33.9, adult: Secondary | ICD-10-CM | POA: Diagnosis not present

## 2022-06-06 DIAGNOSIS — R030 Elevated blood-pressure reading, without diagnosis of hypertension: Secondary | ICD-10-CM | POA: Diagnosis not present

## 2022-06-06 DIAGNOSIS — M545 Low back pain, unspecified: Secondary | ICD-10-CM | POA: Diagnosis not present

## 2022-06-06 DIAGNOSIS — Z79899 Other long term (current) drug therapy: Secondary | ICD-10-CM | POA: Diagnosis not present

## 2022-06-07 ENCOUNTER — Ambulatory Visit: Payer: Medicaid Other | Admitting: Licensed Clinical Social Worker

## 2022-06-08 DIAGNOSIS — Z79899 Other long term (current) drug therapy: Secondary | ICD-10-CM | POA: Diagnosis not present

## 2022-06-18 ENCOUNTER — Emergency Department (HOSPITAL_COMMUNITY): Payer: Medicaid Other

## 2022-06-18 ENCOUNTER — Encounter (HOSPITAL_COMMUNITY): Payer: Self-pay

## 2022-06-18 ENCOUNTER — Other Ambulatory Visit: Payer: Self-pay

## 2022-06-18 ENCOUNTER — Inpatient Hospital Stay (HOSPITAL_COMMUNITY)
Admission: EM | Admit: 2022-06-18 | Discharge: 2022-06-19 | DRG: 281 | Discharge status: Against Medical Advice | Payer: Medicaid Other | Attending: Internal Medicine | Admitting: Internal Medicine

## 2022-06-18 DIAGNOSIS — E119 Type 2 diabetes mellitus without complications: Secondary | ICD-10-CM

## 2022-06-18 DIAGNOSIS — K219 Gastro-esophageal reflux disease without esophagitis: Secondary | ICD-10-CM | POA: Diagnosis not present

## 2022-06-18 DIAGNOSIS — Z6833 Body mass index (BMI) 33.0-33.9, adult: Secondary | ICD-10-CM

## 2022-06-18 DIAGNOSIS — E669 Obesity, unspecified: Secondary | ICD-10-CM | POA: Diagnosis present

## 2022-06-18 DIAGNOSIS — I319 Disease of pericardium, unspecified: Secondary | ICD-10-CM | POA: Diagnosis not present

## 2022-06-18 DIAGNOSIS — E1165 Type 2 diabetes mellitus with hyperglycemia: Secondary | ICD-10-CM | POA: Diagnosis not present

## 2022-06-18 DIAGNOSIS — Z79899 Other long term (current) drug therapy: Secondary | ICD-10-CM

## 2022-06-18 DIAGNOSIS — E871 Hypo-osmolality and hyponatremia: Secondary | ICD-10-CM | POA: Diagnosis present

## 2022-06-18 DIAGNOSIS — Z8249 Family history of ischemic heart disease and other diseases of the circulatory system: Secondary | ICD-10-CM

## 2022-06-18 DIAGNOSIS — Z5986 Financial insecurity: Secondary | ICD-10-CM | POA: Diagnosis not present

## 2022-06-18 DIAGNOSIS — I10 Essential (primary) hypertension: Secondary | ICD-10-CM | POA: Diagnosis not present

## 2022-06-18 DIAGNOSIS — F17210 Nicotine dependence, cigarettes, uncomplicated: Secondary | ICD-10-CM | POA: Diagnosis not present

## 2022-06-18 DIAGNOSIS — E785 Hyperlipidemia, unspecified: Secondary | ICD-10-CM | POA: Diagnosis not present

## 2022-06-18 DIAGNOSIS — Z794 Long term (current) use of insulin: Secondary | ICD-10-CM | POA: Diagnosis not present

## 2022-06-18 DIAGNOSIS — Z800 Family history of malignant neoplasm of digestive organs: Secondary | ICD-10-CM | POA: Diagnosis not present

## 2022-06-18 DIAGNOSIS — Z833 Family history of diabetes mellitus: Secondary | ICD-10-CM | POA: Diagnosis not present

## 2022-06-18 DIAGNOSIS — E1140 Type 2 diabetes mellitus with diabetic neuropathy, unspecified: Secondary | ICD-10-CM

## 2022-06-18 DIAGNOSIS — R079 Chest pain, unspecified: Secondary | ICD-10-CM | POA: Insufficient documentation

## 2022-06-18 DIAGNOSIS — I214 Non-ST elevation (NSTEMI) myocardial infarction: Secondary | ICD-10-CM | POA: Diagnosis not present

## 2022-06-18 DIAGNOSIS — I2510 Atherosclerotic heart disease of native coronary artery without angina pectoris: Secondary | ICD-10-CM | POA: Diagnosis present

## 2022-06-18 DIAGNOSIS — Z720 Tobacco use: Secondary | ICD-10-CM | POA: Diagnosis not present

## 2022-06-18 DIAGNOSIS — Z7984 Long term (current) use of oral hypoglycemic drugs: Secondary | ICD-10-CM | POA: Diagnosis not present

## 2022-06-18 DIAGNOSIS — E872 Acidosis, unspecified: Secondary | ICD-10-CM | POA: Diagnosis not present

## 2022-06-18 HISTORY — DX: Non-ST elevation (NSTEMI) myocardial infarction: I21.4

## 2022-06-18 LAB — CBC
HCT: 42.1 % (ref 36.0–46.0)
Hemoglobin: 13.1 g/dL (ref 12.0–15.0)
MCH: 26.7 pg (ref 26.0–34.0)
MCHC: 31.1 g/dL (ref 30.0–36.0)
MCV: 85.9 fL (ref 80.0–100.0)
Platelets: 241 10*3/uL (ref 150–400)
RBC: 4.9 MIL/uL (ref 3.87–5.11)
RDW: 13.1 % (ref 11.5–15.5)
WBC: 8.8 10*3/uL (ref 4.0–10.5)
nRBC: 0 % (ref 0.0–0.2)

## 2022-06-18 LAB — GLUCOSE, CAPILLARY: Glucose-Capillary: 285 mg/dL — ABNORMAL HIGH (ref 70–99)

## 2022-06-18 LAB — TROPONIN I (HIGH SENSITIVITY)
Troponin I (High Sensitivity): 268 ng/L (ref <18)
Troponin I (High Sensitivity): 336 ng/L (ref <18)

## 2022-06-18 LAB — BASIC METABOLIC PANEL
Anion gap: 14 (ref 5–15)
BUN: 8 mg/dL (ref 6–20)
CO2: 21 mmol/L — ABNORMAL LOW (ref 22–32)
Calcium: 9 mg/dL (ref 8.9–10.3)
Chloride: 99 mmol/L (ref 98–111)
Creatinine, Ser: 0.85 mg/dL (ref 0.44–1.00)
GFR, Estimated: >60 mL/min (ref >60)
Glucose, Bld: 372 mg/dL — ABNORMAL HIGH (ref 70–99)
Potassium: 3.6 mmol/L (ref 3.5–5.1)
Sodium: 134 mmol/L — ABNORMAL LOW (ref 135–145)

## 2022-06-18 LAB — CBG MONITORING, ED: Glucose-Capillary: 275 mg/dL — ABNORMAL HIGH (ref 70–99)

## 2022-06-18 LAB — I-STAT BETA HCG BLOOD, ED (MC, WL, AP ONLY): I-stat hCG, quantitative: <5 m[IU]/mL (ref <5)

## 2022-06-18 LAB — D-DIMER, QUANTITATIVE: D-Dimer, Quant: 0.27 ug/mL-FEU (ref 0.00–0.50)

## 2022-06-18 LAB — HEMOGLOBIN A1C
Hgb A1c MFr Bld: 12 % — ABNORMAL HIGH (ref 4.8–5.6)
Mean Plasma Glucose: 297.7 mg/dL

## 2022-06-18 MED ORDER — HYDROMORPHONE HCL 1 MG/ML IJ SOLN
1.0000 mg | INTRAMUSCULAR | Status: DC | PRN
  Administered 2022-06-18: 1 mg via INTRAVENOUS
  Filled 2022-06-18: qty 1

## 2022-06-18 MED ORDER — MELATONIN 3 MG PO TABS
3.0000 mg | ORAL_TABLET | Freq: Every evening | ORAL | Status: DC | PRN
  Filled 2022-06-18: qty 1

## 2022-06-18 MED ORDER — ACETAMINOPHEN 325 MG PO TABS
650.0000 mg | ORAL_TABLET | Freq: Four times a day (QID) | ORAL | Status: DC | PRN
  Administered 2022-06-19: 650 mg via ORAL
  Filled 2022-06-18: qty 2

## 2022-06-18 MED ORDER — ATORVASTATIN CALCIUM 40 MG PO TABS: 40.0000 mg | ORAL_TABLET | Freq: Once | ORAL | Status: DC

## 2022-06-18 MED ORDER — ACETAMINOPHEN 650 MG RE SUPP: 650.0000 mg | Freq: Four times a day (QID) | RECTAL | Status: DC | PRN

## 2022-06-18 MED ORDER — HEPARIN BOLUS VIA INFUSION
4000.0000 [IU] | Freq: Once | INTRAVENOUS | Status: AC
  Administered 2022-06-18: 4000 [IU] via INTRAVENOUS
  Filled 2022-06-18: qty 4000

## 2022-06-18 MED ORDER — METOPROLOL TARTRATE 12.5 MG HALF TABLET
12.5000 mg | ORAL_TABLET | Freq: Two times a day (BID) | ORAL | Status: DC
  Administered 2022-06-18 – 2022-06-19 (×2): 12.5 mg via ORAL
  Filled 2022-06-18 (×2): qty 1

## 2022-06-18 MED ORDER — ROSUVASTATIN CALCIUM 20 MG PO TABS
20.0000 mg | ORAL_TABLET | Freq: Every day | ORAL | Status: DC
  Administered 2022-06-18 – 2022-06-19 (×2): 20 mg via ORAL
  Filled 2022-06-18 (×2): qty 1

## 2022-06-18 MED ORDER — HEPARIN (PORCINE) 25000 UT/250ML-% IV SOLN
1650.0000 [IU]/h | INTRAVENOUS | Status: DC
  Administered 2022-06-18: 1200 [IU]/h via INTRAVENOUS
  Filled 2022-06-18 (×2): qty 250

## 2022-06-18 MED ORDER — LISINOPRIL 10 MG PO TABS
10.0000 mg | ORAL_TABLET | Freq: Every day | ORAL | Status: DC
  Administered 2022-06-19: 10 mg via ORAL
  Filled 2022-06-18: qty 1

## 2022-06-18 MED ORDER — NITROGLYCERIN IN D5W 200-5 MCG/ML-% IV SOLN
0.0000 ug/min | INTRAVENOUS | Status: DC
  Administered 2022-06-18: 5 ug/min via INTRAVENOUS
  Filled 2022-06-18: qty 250

## 2022-06-18 MED ORDER — ONDANSETRON HCL 4 MG/2ML IJ SOLN: 4.0000 mg | Freq: Four times a day (QID) | INTRAMUSCULAR | Status: DC | PRN

## 2022-06-18 MED ORDER — ASPIRIN 81 MG PO CHEW
324.0000 mg | CHEWABLE_TABLET | Freq: Once | ORAL | Status: AC
  Administered 2022-06-18: 324 mg via ORAL
  Filled 2022-06-18: qty 4

## 2022-06-18 MED ORDER — INSULIN ASPART 100 UNIT/ML IJ SOLN: 0.0000 [IU] | Freq: Three times a day (TID) | INTRAMUSCULAR | Status: DC

## 2022-06-18 MED ORDER — POTASSIUM CHLORIDE CRYS ER 20 MEQ PO TBCR
40.0000 meq | EXTENDED_RELEASE_TABLET | Freq: Once | ORAL | Status: AC
  Administered 2022-06-18: 40 meq via ORAL
  Filled 2022-06-18: qty 2

## 2022-06-18 MED ORDER — NALOXONE HCL 0.4 MG/ML IJ SOLN: 0.4000 mg | INTRAMUSCULAR | Status: DC | PRN

## 2022-06-18 MED ORDER — INSULIN ASPART 100 UNIT/ML IJ SOLN
0.0000 [IU] | Freq: Every day | INTRAMUSCULAR | Status: DC
  Administered 2022-06-18: 3 [IU] via SUBCUTANEOUS

## 2022-06-18 NOTE — ED Provider Triage Note (Signed)
Emergency Medicine Provider Triage Evaluation Note  Misty [name] , a 42 y.o. female  was evaluated in triage.  Pt complains of chest pain and right arm discomfort   Review of Systems  Positive: Chest pain  Negative: Fever or chills  Physical Exam  BP (!) 160/79   Pulse 90   Temp 98.3 F (36.8 C)   Resp 16   SpO2 100%  Gen:   Awake, no distress   Resp:  Normal effort  MSK:   Moves extremities without difficulty  Other:    Medical Decision Making  Medically screening exam initiated at 3:44 PM.  Appropriate orders placed.  Audene Shumway Pierce-Younger was informed that the remainder of the evaluation will be completed by another provider, this initial triage assessment does not replace that evaluation, and the importance of remaining in the ED until their evaluation is complete.     Elson Areas, [place] 06/18/22 1545

## 2022-06-18 NOTE — ED Notes (Signed)
ED TO INPATIENT HANDOFF REPORT  ED Nurse Name and Phone #:  Miasha Emmons 50  S Name/Age/Gender Misty [name] 42 y.o. [name] Room/Bed: 029C/029C  Code Status   Code Status: Full Code  Home/SNF/Other Home Patient oriented to: self, place, time, and situation Is this baseline? Yes   Triage Complete: Triage complete  Chief Complaint NSTEMI (non-ST elevated myocardial infarction) (HCC) [I21.4]  Triage Note Pt came in via POV d/t CP that started 3 days ago. It is Rt sided pressure that is tingling in nature also, does radiate into both arms & her Rt foot feels like it is burning. Does have some SOB, O2 is 100% on RA while in triage. Does take meds for her cholesterol & diltiazem. A/Ox4, 7/10 pain while in triage.    Allergies No Known Allergies  Level of Care/Admitting Diagnosis ED Disposition     ED Disposition  Admit   Condition  --   Comment  Hospital Area: [facility] [[id number]]  Level of Care: Telemetry Cardiac [[id number]]  May admit patient to [facility] or [facility] if equivalent level of care is available:: No  Covid Evaluation: Asymptomatic - no recent exposure (last 10 days) testing not required  Diagnosis: NSTEMI (non-ST elevated myocardial infarction) [facility]) [[id number]]  Admitting Physician: Angie Fava [[id number]]  Attending Physician: Angie Fava [[id number]]  Certification:: I certify this patient will need inpatient services for at least 2 midnights  Estimated Length of Stay: 2          B Medical/Surgery History Past Medical History:  Diagnosis Date   Chest pain    multiple ED evaluations, most likely musculoskeletal (cervical radiculopathy), needs MRI spine   Diabetes mellitus    Diabetes mellitus without complication (HCC)    Diverticulitis 2019   GERD (gastroesophageal reflux disease)    Gestational diabetes    Hidradenitis suppurativa 01/18/2018   Hypertension    PCOD (polycystic ovarian disease)    Followed by  [facility], has been on metformin   PCOS (polycystic ovarian syndrome)    Past Surgical History:  Procedure Laterality Date   CESAREAN SECTION  12/14/2011   Procedure: CESAREAN SECTION;  Surgeon: Antionette Char, MD;  Location: [facility];  Service: Obstetrics;  Laterality: N/A;  Primary Cesarean Section Delivery Girl @ [phone], Apgars     A IV Location/Drains/Wounds Patient Lines/Drains/Airways Status     Active Line/Drains/Airways     Name Placement date Placement time Site Days   Peripheral IV 06/18/22 20 G Left Antecubital 06/18/22  1740  Antecubital  less than 1            Intake/Output Last 24 hours  Intake/Output Summary (Last 24 hours) at 06/18/2022 2037 Last data filed at 06/18/2022 1857 Gross per 24 hour  Intake 1.7 ml  Output --  Net 1.7 ml    Labs/Imaging Results for orders placed or performed during the hospital encounter of 06/18/22 (from the past 48 hour(s))  Basic metabolic panel     Status: Abnormal   Collection Time: 06/18/22  3:30 PM  Result Value Ref Range   Sodium 134 (L) 135 - 145 mmol/L   Potassium 3.6 3.5 - 5.1 mmol/L   Chloride 99 98 - 111 mmol/L   CO2 21 (L) 22 - 32 mmol/L   Glucose, Bld 372 (H) 70 - 99 mg/dL    Comment: Glucose reference range applies only to samples taken after fasting for at least 8 hours.   BUN 8 6 - 20 mg/dL  Creatinine, Ser 0.85 0.44 - 1.00 mg/dL   Calcium 9.0 8.9 - 16.1 mg/dL   GFR, Estimated >09 >60 mL/min    Comment: (NOTE) Calculated using the CKD-EPI Creatinine Equation (2021)    Anion gap 14 5 - 15    Comment: Performed at [facility], [address] [place], [place] [address]  CBC     Status: None   Collection Time: 06/18/22  3:30 PM  Result Value Ref Range   WBC 8.8 4.0 - 10.5 K/uL   RBC 4.90 3.87 - 5.11 MIL/uL   Hemoglobin 13.1 12.0 - 15.0 g/dL   HCT 81.1 91.4 - 78.2 %   MCV 85.9 80.0 - 100.0 fL   MCH 26.7 26.0 - 34.0 pg   MCHC 31.1 30.0 - 36.0 g/dL   RDW 95.6 21.3 - 08.6 %   Platelets 241 150 - 400  K/uL   nRBC 0.0 0.0 - 0.2 %    Comment: Performed at [facility], [address], [place], [place] [address]  Troponin I (High Sensitivity)     Status: Abnormal   Collection Time: 06/18/22  3:30 PM  Result Value Ref Range   Troponin I (High Sensitivity) 268 (HH) <18 ng/L    Comment: CRITICAL RESULT CALLED TO, READ BACK BY AND VERIFIED WITH P,PULLIAM RN @1638  06/18/22 E,BENTON (NOTE) Elevated high sensitivity troponin I (hsTnI) values and significant  changes across serial measurements may suggest ACS but many other  chronic and acute conditions are known to elevate hsTnI results.  Refer to the "Links" section for chest pain algorithms and additional  guidance. Performed at [facility], [address], [place], [place] [address]   I-Stat beta hCG blood, ED     Status: None   Collection Time: 06/18/22  3:55 PM  Result Value Ref Range   I-stat hCG, quantitative <5.0 <5 mIU/mL   Comment 3            Comment:   GEST. AGE      CONC.  (mIU/mL)   <=1 WEEK        5 - 50     2 WEEKS       50 - 500     3 WEEKS       100 - 10,000     4 WEEKS     1,000 - 30,000        [name] AND NON-PREGNANT [name]:     LESS THAN 5 mIU/mL   CBG monitoring, ED     Status: Abnormal   Collection Time: 06/18/22  5:35 PM  Result Value Ref Range   Glucose-Capillary 275 (H) 70 - 99 mg/dL    Comment: Glucose reference range applies only to samples taken after fasting for at least 8 hours.  Troponin I (High Sensitivity)     Status: Abnormal   Collection Time: 06/18/22  5:35 PM  Result Value Ref Range   Troponin I (High Sensitivity) 336 (HH) <18 ng/L    Comment: CRITICAL VALUE NOTED. VALUE IS CONSISTENT WITH PREVIOUSLY REPORTED/CALLED VALUE (NOTE) Elevated high sensitivity troponin I (hsTnI) values and significant  changes across serial measurements may suggest ACS but many other  chronic and acute conditions are known to elevate hsTnI results.  Refer to the "Links" section for chest pain algorithms  and additional  guidance. Performed at [facility], [address] [place], [place] [address]   D-dimer, quantitative     Status: None  Collection Time: 06/18/22  5:39 PM  Result Value Ref Range   D-Dimer, Quant 0.27 0.00 - 0.50 ug/mL-FEU    Comment: (NOTE) At the manufacturer cut-off value of 0.5 g/mL FEU, this assay has a negative predictive value of 95-100%.This assay is intended for use in conjunction with a clinical pretest probability (PTP) assessment model to exclude pulmonary embolism (PE) and deep venous thrombosis (DVT) in outpatients suspected of PE or DVT. Results should be correlated with clinical presentation. Performed at [facility], [address], [place], [place] [address]    DG Chest 2 View  Result Date: 06/18/2022 CLINICAL DATA:  Chest pain. EXAM: CHEST - 2 VIEW COMPARISON:  Chest radiograph 11/08/2020. FINDINGS: The heart size and mediastinal contours are within normal limits. Both lungs are clear. The visualized skeletal structures are unremarkable. IMPRESSION: No active cardiopulmonary disease. Electronically Signed   By: Annia Belt M.D.   On: 06/18/2022 16:22    Pending Labs Unresulted Labs (From admission, onward)     Start     Ordered   06/19/22 0500  Heparin level (unfractionated)  Daily,   R     See Hyperspace for full Linked Orders Report.   06/18/22 1801   06/19/22 0500  CBC  Daily,   R     See Hyperspace for full Linked Orders Report.   06/18/22 1801   06/19/22 0500  Comprehensive metabolic panel  Tomorrow morning,   R        06/18/22 2035   06/19/22 0500  Magnesium  Tomorrow morning,   R        06/18/22 2035   06/19/22 0000  Heparin level (unfractionated)  Once-Timed,   URGENT        06/18/22 1801   06/18/22 2036  Magnesium  Add-on,   AD        06/18/22 2035            Vitals/Pain Today's Vitals   06/18/22 1830 06/18/22 1845 06/18/22 1900 06/18/22 1921  BP: 133/65 (!) 144/75 (!) 149/66   Pulse:      Resp: 15 18  (!) 24   Temp:      SpO2:      Weight:      Height:      PainSc:    0-No pain    Isolation Precautions No active isolations  Medications Medications  nitroGLYCERIN 50 mg in dextrose 5 % 250 mL (0.2 mg/mL) infusion (10 mcg/min Intravenous Infusion Verify 06/18/22 1857)  heparin ADULT infusion 100 units/mL (25000 units/268mL) (1,200 Units/hr Intravenous New Bag/Given 06/18/22 1856)  acetaminophen (TYLENOL) tablet 650 mg (has no administration in time range)    Or  acetaminophen (TYLENOL) suppository 650 mg (has no administration in time range)  metoprolol tartrate (LOPRESSOR) tablet 12.5 mg (has no administration in time range)  lisinopril (ZESTRIL) tablet 10 mg (has no administration in time range)  melatonin tablet 3 mg (has no administration in time range)  ondansetron (ZOFRAN) injection 4 mg (has no administration in time range)  potassium chloride SA (KLOR-CON M) CR tablet 40 mEq (has no administration in time range)  atorvastatin (LIPITOR) tablet 40 mg (has no administration in time range)  aspirin chewable tablet 324 mg (324 mg Oral Given 06/18/22 1659)  heparin bolus via infusion 4,000 Units (4,000 Units Intravenous Bolus from Bag 06/18/22 1856)    Mobility walks     Focused Assessments Cardiac Assessment Handoff:    Lab Results  Component Value Date  CKTOTAL 125 10/21/2010   CKMB 2.3 10/21/2010   Lab Results  Component Value Date   DDIMER 0.27 06/18/2022   Does the Patient currently have chest pain? No    R Recommendations: See Admitting Provider Note  Report given to:   Additional Notes: n/a

## 2022-06-18 NOTE — H&P (Signed)
History and Physical      Misty [name] NGE:[id number] DOB: Nov 03, 1980 DOA: 06/18/2022; DOS: 06/18/2022  [facility] *** Patient coming from: home ***  I have personally briefly reviewed patient's old medical records in [facility]  Chief Complaint: ***  HPI: Misty [name] is a 42 y.o. female with medical history significant for *** who is admitted to [facility] on 06/18/2022 with *** after presenting from home*** to [facility] ED complaining of ***.    ***       ***   ED Course:  Vital signs in the ED were notable for the following: ***  Labs were notable for the following: ***  Per my interpretation, EKG in ED demonstrated the following:  ***  Imaging and additional notable ED work-up: ***  While in the ED, the following were administered: ***  Subsequently, the patient was admitted  ***  ***red    Review of Systems: As per HPI otherwise 10 point review of systems negative.   Past Medical History:  Diagnosis Date   Chest pain    multiple ED evaluations, most likely musculoskeletal (cervical radiculopathy), needs MRI spine   Diabetes mellitus    Diabetes mellitus without complication (HCC)    Diverticulitis 2019   GERD (gastroesophageal reflux disease)    Gestational diabetes    Hidradenitis suppurativa 01/18/2018   Hypertension    PCOD (polycystic ovarian disease)    Followed by [facility], has been on metformin   PCOS (polycystic ovarian syndrome)     Past Surgical History:  Procedure Laterality Date   CESAREAN SECTION  12/14/2011   Procedure: CESAREAN SECTION;  Surgeon: Antionette Char, MD;  Location: [facility];  Service: Obstetrics;  Laterality: N/A;  Primary Cesarean Section Delivery Girl @ [phone], Apgars    Social History:  reports that she has been smoking cigarettes. She has been smoking an average of .5 packs per day. She has never used smokeless tobacco. She reports that she does not currently use  alcohol. She reports that she does not use drugs.   No Known Allergies  Family History  Problem Relation Age of Onset   Diabetes Mother    Hypertension Mother    Stomach cancer Mother        diet at 3   Heart disease Father        diet at 96   Diabetes Sister    Hypertension Sister    Diabetes Brother    Cancer Maternal Grandmother    Cancer Paternal Grandmother     Family history reviewed and not pertinent ***   Prior to Admission medications   Medication Sig Start Date End Date Taking? Authorizing Provider  acetaminophen (TYLENOL) 500 MG tablet Take 1,500 mg by mouth daily.   Yes [provider]  diltiazem (CARDIZEM CD) 360 MG 24 hr capsule Take 1 capsule (360 mg total) by mouth daily. 04/07/22  Yes [name], MD  LANTUS SOLOSTAR 100 UNIT/ML Solostar Pen Inject 10 Units into the skin daily. 10 units in the am, and 10 units at night. 03/29/20  Yes [provider]  lisinopril-hydrochlorothiazide (ZESTORETIC) 10-12.5 MG tablet Take 1 tablet by mouth daily. 03/15/22  Yes [provider]  metFORMIN (GLUCOPHAGE) 500 MG tablet Take 500 mg by mouth 2 (two) times daily. 03/16/22  Yes [provider]  oxyCODONE-acetaminophen (PERCOCET) 10-325 MG tablet Take 1 tablet by mouth 4 (four) times daily as needed for pain. 06/13/22  Yes [provider]  rosuvastatin (CRESTOR) 5 MG tablet Take 1 tablet (5 mg total) by mouth daily. 04/07/22  Yes [name], MD  simvastatin (ZOCOR) 20 MG tablet Take 20 mg by mouth daily. 06/13/22  Yes [provider]  Vitamin D, Ergocalciferol, (DRISDOL) 1.25 MG (50000 UNIT) CAPS capsule Take 50,000 Units by mouth once a week.   Yes [provider]  Accu-Chek FastClix Lancets MISC Use twice per day to check your blood glucose.  E11.65 09/18/18   [name], MD  glucose blood (ACCU-CHEK GUIDE) test strip Use one strip, twice daily to check your blood glucose.  E11.65 09/18/18   [name], MD      Objective    Physical Exam: Vitals:   06/18/22 1845 06/18/22 1900 06/18/22 2041 06/18/22 2041  BP: (!) 144/75 (!) 149/66 (!) 147/84   Pulse:      Resp: 18 (!) 24    Temp:    98.1 F (36.7 C)  TempSrc:    Oral  SpO2:      Weight:      Height:        General: appears to be stated age; alert, oriented Skin: warm, dry, no rash Head:  AT/Rodman Mouth:  Oral mucosa membranes appear moist, normal dentition Neck: supple; trachea midline Heart:  RRR; did not appreciate any M/R/G Lungs: CTAB, did not appreciate any wheezes, rales, or rhonchi Abdomen: + BS; soft, ND, NT Vascular: 2+ pedal pulses b/l; 2+ radial pulses b/l Extremities: no peripheral edema, no muscle wasting Neuro: strength and sensation intact in upper and lower extremities b/l ***   *** Neuro: 5/5 strength of the proximal and distal flexors and extensors of the upper and lower extremities bilaterally; sensation intact in upper and lower extremities b/l; cranial nerves II through XII grossly intact; no pronator drift; no evidence suggestive of slurred speech, dysarthria, or facial droop; Normal muscle tone. No tremors.  *** Neuro: In the setting of the patient's current mental status and associated inability to follow instructions, unable to perform full neurologic exam at this time.  As such, assessment of strength, sensation, and cranial nerves is limited at this time. Patient noted to spontaneously move all 4 extremities. No tremors.  ***    Labs on Admission: I have personally reviewed following labs and imaging studies  CBC: Recent Labs  Lab 06/18/22 1530  WBC 8.8  HGB 13.1  HCT 42.1  MCV 85.9  PLT 241   Basic Metabolic Panel: Recent Labs  Lab 06/18/22 1530  NA 134*  K 3.6  CL 99  CO2 21*  GLUCOSE 372*  BUN 8  CREATININE 0.85  CALCIUM 9.0   GFR: Estimated Creatinine Clearance: 122.6 mL/min (by C-G formula based on SCr of 0.85 mg/dL). Liver Function Tests: No results for input(s): "AST",  "ALT", "ALKPHOS", "BILITOT", "PROT", "ALBUMIN" in the last 168 hours. No results for input(s): "LIPASE", "AMYLASE" in the last 168 hours. No results for input(s): "AMMONIA" in the last 168 hours. Coagulation Profile: No results for input(s): "INR", "PROTIME" in the last 168 hours. Cardiac Enzymes: No results for input(s): "CKTOTAL", "CKMB", "CKMBINDEX", "TROPONINI" in the last 168 hours. BNP (last 3 results) No results for input(s): "PROBNP" in the last 8760 hours. HbA1C: No results for input(s): "HGBA1C" in the last 72 hours. CBG: Recent Labs  Lab 06/18/22 1735  GLUCAP 275*   Lipid Profile: No results for input(s): "CHOL", "HDL", "LDLCALC", "TRIG", "CHOLHDL", "LDLDIRECT" in the last 72 hours. Thyroid Function Tests: No results for input(s): "TSH", "  T4TOTAL", "FREET4", "T3FREE", "THYROIDAB" in the last 72 hours. Anemia Panel: No results for input(s): "VITAMINB12", "FOLATE", "FERRITIN", "TIBC", "IRON", "RETICCTPCT" in the last 72 hours. Urine analysis:    Component Value Date/Time   COLORURINE YELLOW 10/08/2018 2128   APPEARANCEUR HAZY (A) 10/08/2018 2128   LABSPEC 1.027 10/08/2018 2128   PHURINE 5.0 10/08/2018 2128   GLUCOSEU NEGATIVE 10/08/2018 2128   GLUCOSEU NEG mg/dL 95/62/1308 6578   HGBUR SMALL (A) 10/08/2018 2128   BILIRUBINUR NEGATIVE 10/08/2018 2128   BILIRUBINUR Negative 09/18/2018 1727   KETONESUR NEGATIVE 10/08/2018 2128   PROTEINUR 30 (A) 10/08/2018 2128   UROBILINOGEN 0.2 09/18/2018 1727   UROBILINOGEN 1.0 04/22/2013 1148   NITRITE POSITIVE (A) 10/08/2018 2128   LEUKOCYTESUR NEGATIVE 10/08/2018 2128    Radiological Exams on Admission: DG Chest 2 View  Result Date: 06/18/2022 CLINICAL DATA:  Chest pain. EXAM: CHEST - 2 VIEW COMPARISON:  Chest radiograph 11/08/2020. FINDINGS: The heart size and mediastinal contours are within normal limits. Both lungs are clear. The visualized skeletal structures are unremarkable. IMPRESSION: No active cardiopulmonary  disease. Electronically Signed   By: Annia Belt M.D.   On: 06/18/2022 16:22      Assessment/Plan   Principal Problem:   NSTEMI (non-ST elevated myocardial infarction) (HCC)   ***       ***            ***                ***               ***               ***               ***                ***               ***               ***               ***               ***              ***          ***  DVT prophylaxis: SCD's ***  Code Status: Full code*** Family Communication: none*** Disposition Plan: Per Rounding Team Consults called: none***;  Admission status: ***     I SPENT GREATER THAN 75 *** MINUTES IN CLINICAL CARE TIME/MEDICAL DECISION-MAKING IN COMPLETING THIS ADMISSION.      Chaney Born Thomasina Housley DO [facility]  From 7PM - 7AM   06/18/2022, 8:43 PM   ***

## 2022-06-18 NOTE — ED Provider Notes (Signed)
[place] EMERGENCY DEPARTMENT AT [facility] Provider Note   CSN: [id number] Arrival date & time: 06/18/22  1522     History  No chief complaint on file.   Misty [name] is a 42 y.o. female history of type 2 diabetes, GERD, obesity, PCOS presented with chest pain for the past 3 days.  Patient states the chest pain started her right side of her chest and is radiated to her left side and all throughout her chest and goes into both of her arms.  Patient also notes that the top of her left foot has a burning sensation.  Patient states that she was at work when she noticed this chest pain and it has been persistent ever since.  Chest pain does not radiate to the back or to the abdomen.  Patient does not take blood thinners or aspirin.  Patient had CT coronary done on 01/18/2022 which was normal.  Patient denies fevers, nausea/vomiting, abdominal pain  Cardiologist: Donato Schultz, MD  Home Medications Prior to Admission medications   Medication Sig Start Date End Date Taking? Authorizing Provider  acetaminophen (TYLENOL) 500 MG tablet Take 1,500 mg by mouth daily.   Yes [provider]  diltiazem (CARDIZEM CD) 360 MG 24 hr capsule Take 1 capsule (360 mg total) by mouth daily. 04/07/22  Yes [name], MD  LANTUS SOLOSTAR 100 UNIT/ML Solostar Pen Inject 10 Units into the skin daily. 10 units in the am, and 10 units at night. 03/29/20  Yes [provider]  lisinopril-hydrochlorothiazide (ZESTORETIC) 10-12.5 MG tablet Take 1 tablet by mouth daily. 03/15/22  Yes [provider]  metFORMIN (GLUCOPHAGE) 500 MG tablet Take 500 mg by mouth 2 (two) times daily. 03/16/22  Yes [provider]  oxyCODONE-acetaminophen (PERCOCET) 10-325 MG tablet Take 1 tablet by mouth 4 (four) times daily as needed for pain. 06/13/22  Yes [provider]  rosuvastatin (CRESTOR) 5 MG tablet Take 1 tablet (5 mg total) by mouth daily. 04/07/22  Yes [name],  MD  simvastatin (ZOCOR) 20 MG tablet Take 20 mg by mouth daily. 06/13/22  Yes [provider]  Vitamin D, Ergocalciferol, (DRISDOL) 1.25 MG (50000 UNIT) CAPS capsule Take 50,000 Units by mouth once a week.   Yes [provider]  Accu-Chek FastClix Lancets MISC Use twice per day to check your blood glucose.  E11.65 09/18/18   [name], MD  glucose blood (ACCU-CHEK GUIDE) test strip Use one strip, twice daily to check your blood glucose.  E11.65 09/18/18   [name], MD      Allergies    Patient has no known allergies.    Review of Systems   Review of Systems See HPI Physical Exam Updated Vital Signs BP (!) 147/84   Pulse 74   Temp 98.1 F (36.7 C) (Oral)   Resp (!) 24   Ht 6\' 1"  (1.854 m)   Wt 114.3 kg   SpO2 100%   BMI 33.25 kg/m  Physical Exam Vitals reviewed.  Constitutional:      General: She is not in acute distress.    Appearance: She is diaphoretic.  HENT:     Head: Normocephalic and atraumatic.  Eyes:     Extraocular Movements: Extraocular movements intact.     Conjunctiva/sclera: Conjunctivae normal.     Pupils: Pupils are equal, round, and reactive to light.  Cardiovascular:     Rate and Rhythm: Normal rate and regular rhythm.     Pulses: Normal  pulses.     Heart sounds: Normal heart sounds.     Comments: 2+ bilateral radial/dorsalis pedis pulses with regular rate Pulmonary:     Effort: Pulmonary effort is normal. No respiratory distress.     Breath sounds: Normal breath sounds.  Abdominal:     Palpations: Abdomen is soft.     Tenderness: There is no abdominal tenderness. There is no guarding or rebound.  Musculoskeletal:        General: Normal range of motion.     Cervical back: Normal range of motion and neck supple.     Comments: 5 out of 5 bilateral grip/leg extension strength  Skin:    General: Skin is warm.     Capillary Refill: Capillary refill takes less than 2 seconds.  Neurological:     General: No focal deficit  present.     Mental Status: She is alert and oriented to person, place, and time.     Comments: Sensation intact in all 4 limbs  Psychiatric:        Mood and Affect: Mood normal.     ED Results / Procedures / Treatments   Labs (all labs ordered are listed, but only abnormal results are displayed) Labs Reviewed  BASIC METABOLIC PANEL - Abnormal; Notable for the following components:      Result Value   Sodium 134 (*)    CO2 21 (*)    Glucose, Bld 372 (*)    All other components within normal limits  CBG MONITORING, ED - Abnormal; Notable for the following components:   Glucose-Capillary 275 (*)    All other components within normal limits  TROPONIN I (HIGH SENSITIVITY) - Abnormal; Notable for the following components:   Troponin I (High Sensitivity) 268 (*)    All other components within normal limits  TROPONIN I (HIGH SENSITIVITY) - Abnormal; Notable for the following components:   Troponin I (High Sensitivity) 336 (*)    All other components within normal limits  CBC  D-DIMER, QUANTITATIVE  HEPARIN LEVEL (UNFRACTIONATED)  CBC  HEPARIN LEVEL (UNFRACTIONATED)  COMPREHENSIVE METABOLIC PANEL  MAGNESIUM  MAGNESIUM  C-REACTIVE PROTEIN  SEDIMENTATION RATE  I-STAT BETA HCG BLOOD, ED ([facility], [facility], [facility] ONLY)    EKG EKG Interpretation  Date/Time:  Sunday June 18 2022 16:59:50 EDT Ventricular Rate:  82 PR Interval:  123 QRS Duration: 85 QT Interval:  376 QTC Calculation: 440 R Axis:   14 Text Interpretation: Sinus rhythm Borderline T wave abnormalities Minimal ST elevation, anterior leads flipped t wave in lead III seen on prior No significant change since last tracing Confirmed by Melene Plan [phone]) on 06/18/2022 5:06:14 PM  Radiology DG Chest 2 View  Result Date: 06/18/2022 CLINICAL DATA:  Chest pain. EXAM: CHEST - 2 VIEW COMPARISON:  Chest radiograph 11/08/2020. FINDINGS: The heart size and mediastinal contours are within normal limits. Both lungs are clear. The visualized  skeletal structures are unremarkable. IMPRESSION: No active cardiopulmonary disease. Electronically Signed   By: Annia Belt M.D.   On: 06/18/2022 16:22    Procedures .Critical Care  Performed by: Netta Corrigan, PA-C Authorized by: Netta Corrigan, PA-C   Critical care provider statement:    Critical care time (minutes):  30   Critical care time was exclusive of:  Separately billable procedures and treating other patients   Critical care was necessary to treat or prevent imminent or life-threatening deterioration of the following conditions:  Cardiac failure   Critical care was time spent personally by  me on the following activities:  Development of treatment plan with patient or surrogate, discussions with consultants, evaluation of patient's response to treatment, examination of patient, obtaining history from patient or surrogate, review of old charts, re-evaluation of patient's condition, pulse oximetry, ordering and review of radiographic studies, ordering and review of laboratory studies and ordering and performing treatments and interventions   I assumed direction of critical care for this patient from another provider in my specialty: no     Care discussed with: admitting provider       Medications Ordered in ED Medications  nitroGLYCERIN 50 mg in dextrose 5 % 250 mL (0.2 mg/mL) infusion (10 mcg/min Intravenous Infusion Verify 06/18/22 1857)  heparin ADULT infusion 100 units/mL (25000 units/270mL) (1,200 Units/hr Intravenous New Bag/Given 06/18/22 1856)  acetaminophen (TYLENOL) tablet 650 mg (has no administration in time range)    Or  acetaminophen (TYLENOL) suppository 650 mg (has no administration in time range)  metoprolol tartrate (LOPRESSOR) tablet 12.5 mg (has no administration in time range)  lisinopril (ZESTRIL) tablet 10 mg (has no administration in time range)  melatonin tablet 3 mg (has no administration in time range)  ondansetron (ZOFRAN) injection 4 mg (has no  administration in time range)  potassium chloride SA (KLOR-CON M) CR tablet 40 mEq (has no administration in time range)  rosuvastatin (CRESTOR) tablet 20 mg (has no administration in time range)  aspirin chewable tablet 324 mg (324 mg Oral Given 06/18/22 1659)  heparin bolus via infusion 4,000 Units (4,000 Units Intravenous Bolus from Bag 06/18/22 1856)    ED Course/ Medical Decision Making/ A&P                             Medical Decision Making Amount and/or Complexity of Data Reviewed Labs: ordered. Radiology: ordered.  Risk OTC drugs.   Misty [name] 42 y.o. presented today for chest pain. Working DDx that I considered at this time includes, but not limited to, ACS, GERD, pulmonary embolism, community-acquired pneumonia, aortic dissection, pneumothorax, underlying bony abnormality, anemia, thyrotoxicosis, esophageal rupture.    R/o Dx: GERD, pulmonary embolism, community-acquired pneumonia, aortic dissection, pneumothorax, underlying bony abnormality, anemia, thyrotoxicosis, esophageal rupture: These are considered less likely due to history of present illness and physical exam findings. Aortic Dissection: less likely based on the location, quality, onset, and severity of symptoms in this case.  Patient also has a lack of underlying history of AD or TAA.   Review of prior external notes: 01/12/2022 progress Notes  Unique Tests and My Interpretation:  EKG: Flipped T's in lead III, minimal ST elevation in V1, no contiguous elevation or depressions noted, no blocks noted Troponin: 268 CXR: No acute cardiopulmonary changes CBC: Unremarkable BMP: Hyperglycemia 372  Discussion with Independent Historian: None  Discussion of Management of Tests:  Orson Aloe, MD cardiologist ; Arlean Hopping, MD Hospitalist  Risk: High: hospitalization or escalation of hospital-level care  Risk Stratification Score: none  Staffed with Adela Lank, DO  Plan: Patient presented for chest pain.  On exam patient was in no acute distress but was diaphoretic with stable vitals. Patient's physical was remarkable for diaphoresis however the rest of her exam is reassuring. Labs and CXR were ordered in triage which showed that patient has a troponin of 268.  Patient's EKG was reassuring however with a troponin that high concern for NSTEMI and so patient received a full aspirin, heparin, nitro dose as she is continuing to have ongoing chest pain.  Cardiology be consulted.  Patient stable at this time.  Cardiology called back and asked to have D-dimer added on and stated they would see the patient.  Patient stable this time.  Patient does troponin was 336 with a negative D-dimer.  Cardiology was called once again to be updated of these results and stated he would be here in 30 minutes so around 7:30 PM.  Patient stable at this time and receiving heparin and nitro infusions.  I spoke to the cardiologist again and with her normal CT coronary back in January the cardiologist feels patient could be mated to medicine.  Patient stable for admission at this time and hospitalist will be consulted.         Final Clinical Impression(s) / ED Diagnoses Final diagnoses:  NSTEMI (non-ST elevated myocardial infarction) [facility])    Rx / DC Orders ED Discharge Orders     None         Remi Deter 06/18/22 2046    Melene Plan, DO 06/18/22 2314

## 2022-06-18 NOTE — Plan of Care (Signed)
  Problem: Education: Goal: Knowledge of General Education information will improve Description: Including pain rating scale, medication(s)/side effects and non-pharmacologic comfort measures Outcome: Progressing   Problem: Health Behavior/Discharge Planning: Goal: Ability to manage health-related needs will improve Outcome: Progressing   Problem: Clinical Measurements: Goal: Cardiovascular complication will be avoided Outcome: Progressing   Problem: Activity: Goal: Risk for activity intolerance will decrease Outcome: Progressing   Problem: Pain Managment: Goal: General experience of comfort will improve Outcome: Progressing   Problem: Safety: Goal: Ability to remain free from injury will improve Outcome: Progressing   

## 2022-06-18 NOTE — Progress Notes (Addendum)
ANTICOAGULATION CONSULT NOTE - Initial Consult  Pharmacy Consult for heparin Indication: chest pain/ACS  No Known Allergies  Patient Measurements:   Heparin Dosing Weight: 100.3kg   Vital Signs: Temp: 98.3 F (36.8 C) (06/16 1527) BP: 147/75 (06/16 1715) Pulse Rate: 86 (06/16 1715)  Labs: Recent Labs    06/18/22 1530  HGB 13.1  HCT 42.1  PLT 241  CREATININE 0.85  TROPONINIHS 268*    CrCl cannot be calculated (Unknown ideal weight.).   Medical History: Past Medical History:  Diagnosis Date   Chest pain    multiple ED evaluations, most likely musculoskeletal (cervical radiculopathy), needs MRI spine   Diabetes mellitus    Diabetes mellitus without complication (HCC)    Diverticulitis 2019   GERD (gastroesophageal reflux disease)    Gestational diabetes    Hidradenitis suppurativa 01/18/2018   Hypertension    PCOD (polycystic ovarian disease)    Followed by [facility], has been on metformin   PCOS (polycystic ovarian syndrome)     Assessment: Patient with CC of chest pain. Trop elevated to 268. HgB: 13.1 and PLTs 241. Patient not on anticoagulation PTA. Pharmacy consulted to dose heparin.   Goal of Therapy:  Heparin level 0.3-0.7 units/ml Monitor platelets by anticoagulation protocol: Yes   Plan:  Give 4000 units bolus x 1 Start heparin infusion at 1200 units/hr Check anti-Xa level in 6 hours and daily while on heparin Continue to monitor H&H and platelets  Estill Batten, PharmD, BCCCP  06/18/2022,5:23 PM

## 2022-06-18 NOTE — Consult Note (Signed)
Cardiology Consultation   Patient ID: TAMAIA BENKE MRN: [id number]; DOB: 25-Nov-1980  Admit date: 06/18/2022 Date of Consult: 06/18/2022  PCP:  [facility]   [facility] Providers Cardiologist:  None        Patient Profile:   Misty [name] is a 42 y.o. female with a history of insulin-dependent diabetes, hypertension, and polycystic ovarian disease who is being seen 06/18/2022 for the evaluation of chest pain at the request of Cheron Schaumann, PA-C.  History of Present Illness:   Misty [name] reports she first noticed chest pain around 3 days ago.  She states the pain is primarily at her left chest and radiated throughout her chest.  The pain is described as achy, constant, made worse with some movements lying down in bed, and without any specific alleviating factors.  Today, the pain gotten to the point of 9/10 in intensity for which she decided to come in for further evaluation.  She is now chest pain free but did notice when she got up to lean forward and back into bed, the pain was brought on again.  Associated with her symptoms was ongoing shortness of breath.  She reports episodes of chest pain the past for which she had coronary CT angiography performed that was normal, however this chest pain is different.   She denies palpitations, nausea, swelling in her legs, or dizziness.  In the emergency room, initial ECG demonstrated normal rhythm with non-specific ST and T-wave changes.  HS-troponin was  initially 268 ng/dL and went up to 784 ng/L.  D-dimer was collected and was in normal range.  Patient received high dose aspirin and started on heparin drip.     Past Medical History:  Diagnosis Date   Chest pain    multiple ED evaluations, most likely musculoskeletal (cervical radiculopathy), needs MRI spine   Diabetes mellitus    Diabetes mellitus without complication (HCC)    Diverticulitis 2019   GERD (gastroesophageal reflux  disease)    Gestational diabetes    Hidradenitis suppurativa 01/18/2018   Hypertension    PCOD (polycystic ovarian disease)    Followed by [facility], has been on metformin   PCOS (polycystic ovarian syndrome)     Past Surgical History:  Procedure Laterality Date   CESAREAN SECTION  12/14/2011   Procedure: CESAREAN SECTION;  Surgeon: Antionette Char, MD;  Location: [facility];  Service: Obstetrics;  Laterality: N/A;  Primary Cesarean Section Delivery Girl @ [phone], Apgars     Home Medications:  Prior to Admission medications   Medication Sig Start Date End Date Taking? Authorizing Provider  acetaminophen (TYLENOL) 500 MG tablet Take 1,500 mg by mouth daily.   Yes [provider]  diltiazem (CARDIZEM CD) 360 MG 24 hr capsule Take 1 capsule (360 mg total) by mouth daily. 04/07/22  Yes [name], MD  LANTUS SOLOSTAR 100 UNIT/ML Solostar Pen Inject 10 Units into the skin daily. 10 units in the am, and 10 units at night. 03/29/20  Yes [provider]  lisinopril-hydrochlorothiazide (ZESTORETIC) 10-12.5 MG tablet Take 1 tablet by mouth daily. 03/15/22  Yes [provider]  metFORMIN (GLUCOPHAGE) 500 MG tablet Take 500 mg by mouth 2 (two) times daily. 03/16/22  Yes [provider]  oxyCODONE-acetaminophen (PERCOCET) 10-325 MG tablet Take 1 tablet by mouth 4 (four) times daily as needed for pain. 06/13/22  Yes [provider]  rosuvastatin (CRESTOR) 5 MG tablet Take 1 tablet (5 mg total) by mouth daily. 04/07/22  Yes [name], MD  simvastatin (ZOCOR) 20 MG tablet Take 20 mg by mouth daily. 06/13/22  Yes [provider]  Vitamin D, Ergocalciferol, (DRISDOL) 1.25 MG (50000 UNIT) CAPS capsule Take 50,000 Units by mouth once a week.   Yes [provider]  Accu-Chek FastClix Lancets MISC Use twice per day to check your blood glucose.  E11.65 09/18/18   [name], MD  glucose blood (ACCU-CHEK GUIDE) test strip Use one strip, twice daily to  check your blood glucose.  E11.65 09/18/18   [name], MD    Inpatient Medications: Scheduled Meds:  [START ON 06/19/2022] lisinopril  10 mg Oral Daily   metoprolol tartrate  12.5 mg Oral BID   rosuvastatin  20 mg Oral Daily   Continuous Infusions:  heparin 1,200 Units/hr (06/18/22 1856)   nitroGLYCERIN 10 mcg/min (06/18/22 1857)   PRN Meds: acetaminophen **OR** acetaminophen, melatonin, ondansetron (ZOFRAN) IV  Allergies:   No Known Allergies  Social History:   Social History   Socioeconomic History   Marital status: Single    Spouse name: Not on file   Number of children: Not on file   Years of education: Not on file   Highest education level: Not on file  Occupational History   Not on file  Tobacco Use   Smoking status: Every Day    Packs/day: .5    Types: Cigarettes   Smokeless tobacco: Never   Tobacco comments:    0.5ppd  Vaping Use   Vaping Use: Never used  Substance and Sexual Activity   Alcohol use: Not Currently    Comment: social   Drug use: Never   Sexual activity: Yes  Other Topics Concern   Not on file  Social History Narrative   ** Merged History Encounter **       Social Determinants of Health   Financial Resource Strain: Medium Risk (11/15/2021)   Overall Financial Resource Strain (CARDIA)    Difficulty of Paying Living Expenses: Somewhat hard  Food Insecurity: No Food Insecurity (06/18/2022)   Hunger Vital Sign    Worried About Running Out of Food in the Last Year: Never true    Ran Out of Food in the Last Year: Never true  Transportation Needs: No Transportation Needs (06/18/2022)   PRAPARE - Administrator, Civil Service (Medical): No    Lack of Transportation (Non-Medical): No  Physical Activity: Unknown (06/28/2021)   Exercise Vital Sign    Days of Exercise per Week: 7 days    Minutes of Exercise per Session: Not on file  Recent Concern: Physical Activity - Insufficiently Active (06/28/2021)   Exercise Vital Sign     Days of Exercise per Week: 7 days    Minutes of Exercise per Session: 10 min  Stress: No Stress Concern Present (04/26/2022)   [employer] of Occupational Health - Occupational Stress Questionnaire    Feeling of Stress : Only a little  Recent Concern: Stress - Stress Concern Present (02/15/2022)   [employer] of Occupational Health - Occupational Stress Questionnaire    Feeling of Stress : To some extent  Social Connections: Socially Isolated (04/24/2022)   Social Connection and Isolation Panel [NHANES]    Frequency of Communication with Friends and Family: More than three times a week    Frequency of Social Gatherings with Friends and Family: More than three times a week    Attends Religious Services: Never    Database administrator or Organizations: No  Attends Banker Meetings: Never    Marital Status: Never married  Intimate Partner Violence: Not At Risk (06/18/2022)   Humiliation, Afraid, Rape, and Kick questionnaire    Fear of Current or Ex-Partner: No    Emotionally Abused: No    Physically Abused: No    Sexually Abused: No    Family History:   Congestive heart failure; father Family History  Problem Relation Age of Onset   Diabetes Mother    Hypertension Mother    Stomach cancer Mother        diet at 66   Heart disease Father        diet at 91   Diabetes Sister    Hypertension Sister    Diabetes Brother    Cancer Maternal Grandmother    Cancer Paternal Grandmother      ROS:  Please see the history of present illness.   All other ROS reviewed and negative.     Physical Exam/Data:   Vitals:   06/18/22 2043 06/18/22 2045 06/18/22 2048 06/18/22 2051  BP: (!) 155/94 (!) 157/92 (!) 154/73 (!) 151/84  Pulse:      Resp: 14 19 16 17   Temp:      TempSrc:      SpO2:      Weight:      Height:        Intake/Output Summary (Last 24 hours) at 06/18/2022 2102 Last data filed at 06/18/2022 1857 Gross per 24 hour  Intake 1.7 ml  Output --   Net 1.7 ml      06/18/2022    5:53 PM 01/12/2022   10:29 AM 11/08/2020    8:32 PM  Last 3 Weights  Weight (lbs) 252 lb 263 lb 6.4 oz 275 lb 9.2 oz  Weight (kg) 114.306 kg 119.477 kg 125 kg     Body mass index is 33.25 kg/m.  General:  Well nourished, well developed, in no acute distress HEENT: normal Neck: no JVD Vascular: No carotid bruits; Distal pulses 2+ bilaterally Cardiac:  normal S1, S2; RRR; no murmur/gallops/rubs Lungs:  clear to auscultation bilaterally, no wheezing, rhonchi or rales  Abd: soft, nontender, no hepatomegaly  Ext: no edema Musculoskeletal:  No deformities, BUE and BLE strength normal and equal Skin: warm and dry  Neuro:  CNs 2-12 intact, no focal abnormalities noted Psych:  Normal affect   EKG:  The EKG was personally reviewed and demonstrates:  06/18/22 (15:09):  NSR; non-specific T-wave and ST changes Telemetry:  Telemetry was personally reviewed and demonstrates:  NSR  Relevant CV Studies: # Coronary CT angiography 01/18/22 IMPRESSION: 1.  Coronary calcium score of 0. 2.  Normal coronary origin with right dominance. 3. Nonobstructive CAD, with noncalcified plaque in D1 causing minimal (0-24%) stenosis 4.  Mid LAD myocardial bridge CAD-RADS 1. Minimal non-obstructive CAD (0-24%). Consider non-atherosclerotic causes of chest pain. Consider preventive therapy and risk factor modification.    Laboratory Data:  High Sensitivity Troponin:   Recent Labs  Lab 06/18/22 1530 06/18/22 1735  TROPONINIHS 268* 336*     Chemistry Recent Labs  Lab 06/18/22 1530  NA 134*  K 3.6  CL 99  CO2 21*  GLUCOSE 372*  BUN 8  CREATININE 0.85  CALCIUM 9.0  GFRNONAA >60  ANIONGAP 14    No results for input(s): "PROT", "ALBUMIN", "AST", "ALT", "ALKPHOS", "BILITOT" in the last 168 hours. Lipids No results for input(s): "CHOL", "TRIG", "HDL", "LABVLDL", "LDLCALC", "CHOLHDL" in the last 168 hours.  Hematology  Recent Labs  Lab 06/18/22 1530  WBC 8.8  RBC  4.90  HGB 13.1  HCT 42.1  MCV 85.9  MCH 26.7  MCHC 31.1  RDW 13.1  PLT 241   Thyroid No results for input(s): "TSH", "FREET4" in the last 168 hours.  BNPNo results for input(s): "BNP", "PROBNP" in the last 168 hours.  DDimer  Recent Labs  Lab 06/18/22 1739  DDIMER 0.27     Radiology/Studies:  DG Chest 2 View  Result Date: 06/18/2022 CLINICAL DATA:  Chest pain. EXAM: CHEST - 2 VIEW COMPARISON:  Chest radiograph 11/08/2020. FINDINGS: The heart size and mediastinal contours are within normal limits. Both lungs are clear. The visualized skeletal structures are unremarkable. IMPRESSION: No active cardiopulmonary disease. Electronically Signed   By: Annia Belt M.D.   On: 06/18/2022 16:22     Assessment and Plan:   Chest pain: Patient with persistent chest pain of unclear etiology but with symptoms consistent with possible myopericarditis, but cannot rule out NSTEMI-ACS at this time in the setting of rising HS-troponin levels.  Coronary CT angiography recently performed was overall normal in January of this year.  Patient is hemodynamically stable and now chest pain free.  Currently receiving aspirin and heparin drip. --Will discuss with day time cardiology team moving forward with coronary angiography to rule out/in NSTEMI-ACS. --Please get an echocardiogram.   --Please get echocardiogram and check inflammatory markers (CRP and ESR). --We can prophylactically initiate aspirin 750mg  every 8 hours as we await further information (can be switched to Ibuprofen if NSTEMI-ACS is ruled out) with colchicine.   Risk Assessment/Risk Scores:     TIMI Risk Score for Unstable Angina or Non-ST Elevation MI:   The patient's TIMI risk score is 2, which indicates a 8% risk of all cause mortality, new or recurrent myocardial infarction or need for urgent revascularization in the next 14 days.          For questions or updates, please contact [facility] Please consult [email or web address]  for contact info under    Signed, Judie Grieve, MD  06/18/2022 9:02 PM

## 2022-06-18 NOTE — ED Triage Notes (Signed)
Pt came in via POV d/t CP that started 3 days ago. It is Rt sided pressure that is tingling in nature also, does radiate into both arms & her Rt foot feels like it is burning. Does have some SOB, O2 is 100% on RA while in triage. Does take meds for her cholesterol & diltiazem. A/Ox4, 7/10 pain while in triage.

## 2022-06-19 ENCOUNTER — Inpatient Hospital Stay (HOSPITAL_COMMUNITY): Payer: Medicaid Other

## 2022-06-19 ENCOUNTER — Encounter (HOSPITAL_COMMUNITY)
Admission: EM | Discharge status: Against Medical Advice | Payer: Self-pay | Source: Home / Self Care | Attending: Internal Medicine

## 2022-06-19 DIAGNOSIS — E785 Hyperlipidemia, unspecified: Secondary | ICD-10-CM

## 2022-06-19 DIAGNOSIS — I10 Essential (primary) hypertension: Secondary | ICD-10-CM | POA: Diagnosis not present

## 2022-06-19 DIAGNOSIS — I214 Non-ST elevation (NSTEMI) myocardial infarction: Secondary | ICD-10-CM | POA: Diagnosis not present

## 2022-06-19 DIAGNOSIS — R079 Chest pain, unspecified: Secondary | ICD-10-CM | POA: Insufficient documentation

## 2022-06-19 DIAGNOSIS — E119 Type 2 diabetes mellitus without complications: Secondary | ICD-10-CM

## 2022-06-19 HISTORY — PX: LEFT HEART CATH AND CORONARY ANGIOGRAPHY: CATH118249

## 2022-06-19 LAB — COMPREHENSIVE METABOLIC PANEL
ALT: 19 U/L (ref 0–44)
AST: 20 U/L (ref 15–41)
Albumin: 3.2 g/dL — ABNORMAL LOW (ref 3.5–5.0)
Alkaline Phosphatase: 62 U/L (ref 38–126)
Anion gap: 10 (ref 5–15)
BUN: 8 mg/dL (ref 6–20)
CO2: 23 mmol/L (ref 22–32)
Calcium: 8.6 mg/dL — ABNORMAL LOW (ref 8.9–10.3)
Chloride: 105 mmol/L (ref 98–111)
Creatinine, Ser: 0.64 mg/dL (ref 0.44–1.00)
GFR, Estimated: >60 mL/min (ref >60)
Glucose, Bld: 299 mg/dL — ABNORMAL HIGH (ref 70–99)
Potassium: 4.1 mmol/L (ref 3.5–5.1)
Sodium: 138 mmol/L (ref 135–145)
Total Bilirubin: 0.3 mg/dL (ref 0.3–1.2)
Total Protein: 6.6 g/dL (ref 6.5–8.1)

## 2022-06-19 LAB — RAPID URINE DRUG SCREEN, HOSP PERFORMED
Amphetamines: NOT DETECTED
Barbiturates: NOT DETECTED
Benzodiazepines: NOT DETECTED
Cocaine: NOT DETECTED
Opiates: NOT DETECTED
Tetrahydrocannabinol: NOT DETECTED

## 2022-06-19 LAB — CBC
HCT: 42.4 % (ref 36.0–46.0)
Hemoglobin: 13.3 g/dL (ref 12.0–15.0)
MCH: 26.7 pg (ref 26.0–34.0)
MCHC: 31.4 g/dL (ref 30.0–36.0)
MCV: 85 fL (ref 80.0–100.0)
Platelets: 228 10*3/uL (ref 150–400)
RBC: 4.99 MIL/uL (ref 3.87–5.11)
RDW: 13.2 % (ref 11.5–15.5)
WBC: 7.9 10*3/uL (ref 4.0–10.5)
nRBC: 0 % (ref 0.0–0.2)

## 2022-06-19 LAB — GLUCOSE, CAPILLARY
Glucose-Capillary: 305 mg/dL — ABNORMAL HIGH (ref 70–99)
Glucose-Capillary: 210 mg/dL — ABNORMAL HIGH (ref 70–99)
Glucose-Capillary: 326 mg/dL — ABNORMAL HIGH (ref 70–99)

## 2022-06-19 LAB — ECHOCARDIOGRAM COMPLETE
Area-P 1/2: 3.37 cm2
Calc EF: 62.4 %
Height: 73 in
S' Lateral: 3.5 cm
Single Plane A2C EF: 66.6 %
Single Plane A4C EF: 60.7 %
Weight: 3936 oz

## 2022-06-19 LAB — MAGNESIUM
Magnesium: 1.8 mg/dL (ref 1.7–2.4)
Magnesium: 1.9 mg/dL (ref 1.7–2.4)

## 2022-06-19 LAB — HEPARIN LEVEL (UNFRACTIONATED)
Heparin Unfractionated: 0.1 IU/mL — ABNORMAL LOW (ref 0.30–0.70)
Heparin Unfractionated: 0.18 IU/mL — ABNORMAL LOW (ref 0.30–0.70)

## 2022-06-19 LAB — TROPONIN I (HIGH SENSITIVITY): Troponin I (High Sensitivity): 1230 ng/L (ref <18)

## 2022-06-19 LAB — C-REACTIVE PROTEIN: CRP: 1.1 mg/dL — ABNORMAL HIGH (ref <1.0)

## 2022-06-19 LAB — LIPASE, BLOOD: Lipase: 30 U/L (ref 11–51)

## 2022-06-19 LAB — SEDIMENTATION RATE: Sed Rate: 5 mm/hr (ref 0–22)

## 2022-06-19 SURGERY — LEFT HEART CATH AND CORONARY ANGIOGRAPHY
Anesthesia: LOCAL

## 2022-06-19 MED ORDER — HEPARIN (PORCINE) IN NACL 1000-0.9 UT/500ML-% IV SOLN
INTRAVENOUS | Status: DC | PRN
  Administered 2022-06-19 (×2): 500 mL

## 2022-06-19 MED ORDER — VERAPAMIL HCL 2.5 MG/ML IV SOLN
INTRAVENOUS | Status: DC | PRN
  Administered 2022-06-19: 10 mL via INTRA_ARTERIAL

## 2022-06-19 MED ORDER — MIDAZOLAM HCL 2 MG/2ML IJ SOLN
INTRAMUSCULAR | Status: DC | PRN
  Administered 2022-06-19: 2 mg via INTRAVENOUS
  Administered 2022-06-19: 1 mg via INTRAVENOUS

## 2022-06-19 MED ORDER — SODIUM CHLORIDE 0.9 % WEIGHT BASED INFUSION: 3.0000 mL/kg/h | INTRAVENOUS | Status: DC

## 2022-06-19 MED ORDER — INSULIN GLARGINE-YFGN 100 UNIT/ML ~~LOC~~ SOLN
5.0000 [IU] | Freq: Two times a day (BID) | SUBCUTANEOUS | Status: DC
  Administered 2022-06-19: 5 [IU] via SUBCUTANEOUS
  Filled 2022-06-19 (×2): qty 0.05

## 2022-06-19 MED ORDER — LIDOCAINE HCL (PF) 1 % IJ SOLN
INTRAMUSCULAR | Status: DC | PRN
  Administered 2022-06-19: 2 mL

## 2022-06-19 MED ORDER — VERAPAMIL HCL 2.5 MG/ML IV SOLN
INTRAVENOUS | Status: AC
  Filled 2022-06-19: qty 2

## 2022-06-19 MED ORDER — METOPROLOL TARTRATE 25 MG PO TABS: 25.0000 mg | ORAL_TABLET | Freq: Two times a day (BID) | ORAL | Status: DC

## 2022-06-19 MED ORDER — HYDRALAZINE HCL 20 MG/ML IJ SOLN: 10.0000 mg | INTRAMUSCULAR | Status: DC | PRN

## 2022-06-19 MED ORDER — SODIUM CHLORIDE 0.9% FLUSH: 3.0000 mL | Freq: Two times a day (BID) | INTRAVENOUS | Status: DC

## 2022-06-19 MED ORDER — FENTANYL CITRATE (PF) 100 MCG/2ML IJ SOLN
INTRAMUSCULAR | Status: DC | PRN
  Administered 2022-06-19 (×2): 25 ug via INTRAVENOUS

## 2022-06-19 MED ORDER — SODIUM CHLORIDE 0.9 % WEIGHT BASED INFUSION: 1.0000 mL/kg/h | INTRAVENOUS | Status: DC

## 2022-06-19 MED ORDER — HEPARIN SODIUM (PORCINE) 1000 UNIT/ML IJ SOLN
INTRAMUSCULAR | Status: DC | PRN
  Administered 2022-06-19: 6000 [IU] via INTRAVENOUS

## 2022-06-19 MED ORDER — SODIUM CHLORIDE 0.9 % IV SOLN: 250.0000 mL | INTRAVENOUS | Status: DC | PRN

## 2022-06-19 MED ORDER — MIDAZOLAM HCL 2 MG/2ML IJ SOLN
INTRAMUSCULAR | Status: AC
  Filled 2022-06-19: qty 2

## 2022-06-19 MED ORDER — PERFLUTREN LIPID MICROSPHERE
1.0000 mL | INTRAVENOUS | Status: DC | PRN
  Administered 2022-06-19: 2 mL via INTRAVENOUS

## 2022-06-19 MED ORDER — LABETALOL HCL 5 MG/ML IV SOLN: 10.0000 mg | INTRAVENOUS | Status: DC | PRN

## 2022-06-19 MED ORDER — ASPIRIN 81 MG PO TBEC
81.0000 mg | DELAYED_RELEASE_TABLET | Freq: Every day | ORAL | Status: DC
  Administered 2022-06-19: 81 mg via ORAL
  Filled 2022-06-19: qty 1

## 2022-06-19 MED ORDER — LIDOCAINE HCL (PF) 1 % IJ SOLN
INTRAMUSCULAR | Status: AC
  Filled 2022-06-19: qty 30

## 2022-06-19 MED ORDER — PANTOPRAZOLE SODIUM 40 MG IV SOLR
40.0000 mg | INTRAVENOUS | Status: DC
  Administered 2022-06-19: 40 mg via INTRAVENOUS
  Filled 2022-06-19: qty 10

## 2022-06-19 MED ORDER — SODIUM CHLORIDE 0.9% FLUSH: 3.0000 mL | INTRAVENOUS | Status: DC | PRN

## 2022-06-19 MED ORDER — HEPARIN SODIUM (PORCINE) 1000 UNIT/ML IJ SOLN
INTRAMUSCULAR | Status: AC
  Filled 2022-06-19: qty 10

## 2022-06-19 MED ORDER — NICOTINE 14 MG/24HR TD PT24: 14.0000 mg | MEDICATED_PATCH | Freq: Every day | TRANSDERMAL | Status: DC | PRN

## 2022-06-19 MED ORDER — FENTANYL CITRATE (PF) 100 MCG/2ML IJ SOLN
INTRAMUSCULAR | Status: AC
  Filled 2022-06-19: qty 2

## 2022-06-19 MED ORDER — IOHEXOL 350 MG/ML SOLN
INTRAVENOUS | Status: DC | PRN
  Administered 2022-06-19: 70 mL via INTRA_ARTERIAL

## 2022-06-19 MED ORDER — INSULIN GLARGINE-YFGN 100 UNIT/ML ~~LOC~~ SOLN
15.0000 [IU] | Freq: Two times a day (BID) | SUBCUTANEOUS | Status: DC
  Filled 2022-06-19: qty 0.15

## 2022-06-19 MED ORDER — ASPIRIN 81 MG PO CHEW: 81.0000 mg | CHEWABLE_TABLET | ORAL | Status: DC

## 2022-06-19 SURGICAL SUPPLY — 10 items
CATH OPTITORQUE TIG 4.0 5F (CATHETERS) IMPLANT
DEVICE RAD COMP TR BAND LRG (VASCULAR PRODUCTS) IMPLANT
GLIDESHEATH SLEND SS 6F .021 (SHEATH) IMPLANT
GUIDEWIRE INQWIRE 1.5J.035X260 (WIRE) IMPLANT
INQWIRE 1.5J .035X260CM (WIRE) ×1
KIT HEART LEFT (KITS) ×2 IMPLANT
PACK CARDIAC CATHETERIZATION (CUSTOM PROCEDURE TRAY) ×2 IMPLANT
SHEATH PROBE COVER 6X72 (BAG) IMPLANT
TRANSDUCER W/STOPCOCK (MISCELLANEOUS) ×2 IMPLANT
TUBING CIL FLEX 10 FLL-RA (TUBING) ×2 IMPLANT

## 2022-06-19 NOTE — Hospital Course (Addendum)
41yofw/ with HLD, diabetes, hypertension presented with 3 days of exertional substernal chest pressure improving with rest  In the ED: Vitals with BP in 160s/70s, not hypoxic,' Labs with mild hyponatremia bicarb 21 stable CBC blood glucose 372 hCG less than 5,cxr:clear.EKG with nonspecific T wave changes.  Troponin initially 268 --> 336. EDP discussed/consulted cardiology and patient was admitted for NSTEMI on heparin drip, nitroglycerin drip.

## 2022-06-19 NOTE — Progress Notes (Signed)
ANTICOAGULATION CONSULT NOTE  Pharmacy Consult for heparin Indication: chest pain/ACS  No Known Allergies  Patient Measurements: Height: 6\' 1"  (185.4 cm) Weight: 111.6 kg (246 lb) IBW/kg (Calculated) : 75.4 Heparin Dosing Weight: 100.3kg   Vital Signs: Temp: 98.3 F (36.8 C) (06/17 0014) Temp Source: Oral (06/17 0014) BP: 127/69 (06/17 0014) Pulse Rate: 90 (06/17 0014)  Labs: Recent Labs    06/18/22 1530 06/18/22 1735 06/18/22 2330  HGB 13.1  --   --   HCT 42.1  --   --   PLT 241  --   --   HEPARINUNFRC  --   --  0.10*  CREATININE 0.85  --   --   TROPONINIHS 268* 336*  --      Estimated Creatinine Clearance: 121.1 mL/min (by C-G formula based on SCr of 0.85 mg/dL).   Medical History: Past Medical History:  Diagnosis Date   Chest pain    multiple ED evaluations, most likely musculoskeletal (cervical radiculopathy), needs MRI spine   Diabetes mellitus    Diabetes mellitus without complication (HCC)    Diverticulitis 2019   GERD (gastroesophageal reflux disease)    Gestational diabetes    Hidradenitis suppurativa 01/18/2018   Hypertension    PCOD (polycystic ovarian disease)    Followed by [facility], has been on metformin   PCOS (polycystic ovarian syndrome)     Assessment: Patient with CC of chest pain. Trop elevated to 268. HgB: 13.1 and PLTs 241. Patient not on anticoagulation PTA. Pharmacy consulted to dose heparin.   Initial heparin level subtherapeutic on 1200 units/hr  Goal of Therapy:  Heparin level 0.3-0.7 units/ml Monitor platelets by anticoagulation protocol: Yes   Plan:  Increase heparin gtt to 1400 units/hr F/U 6 hour heparin level  Daylene Posey, PharmD, [facility] Clinical Pharmacist ED Pharmacist Phone # [phone] 06/19/2022 12:21 AM

## 2022-06-19 NOTE — Progress Notes (Signed)
ANTICOAGULATION CONSULT NOTE  Pharmacy Consult for heparin Indication: chest pain/ACS  No Known Allergies  Patient Measurements: Height: 6\' 1"  (185.4 cm) Weight: 111.6 kg (246 lb) IBW/kg (Calculated) : 75.4 Heparin Dosing Weight: 100.3kg   Vital Signs: Temp: 98.2 F (36.8 C) (06/17 0737) Temp Source: Oral (06/17 0737) BP: 143/84 (06/17 0737) Pulse Rate: 71 (06/17 0414)  Labs: Recent Labs    06/18/22 1530 06/18/22 1735 06/18/22 2330 06/19/22 0715  HGB 13.1  --   --  13.3  HCT 42.1  --   --  42.4  PLT 241  --   --  228  HEPARINUNFRC  --   --  0.10* 0.18*  CREATININE 0.85  --   --  PENDING  TROPONINIHS 268* 336*  --  1,230*     CrCl cannot be calculated (This lab value cannot be used to calculate CrCl because it is not a number: PENDING).   Medical History: Past Medical History:  Diagnosis Date   Chest pain    multiple ED evaluations, most likely musculoskeletal (cervical radiculopathy), needs MRI spine   Diabetes mellitus    Diabetes mellitus without complication (HCC)    Diverticulitis 2019   GERD (gastroesophageal reflux disease)    Gestational diabetes    Hidradenitis suppurativa 01/18/2018   Hypertension    PCOD (polycystic ovarian disease)    Followed by [facility], has been on metformin   PCOS (polycystic ovarian syndrome)     Assessment: Patient with CC of chest pain. Trop elevated to 268. HgB: 13.1 and PLTs 241. Patient not on anticoagulation PTA. Pharmacy consulted to dose heparin.   Initial heparin level subtherapeutic at 0.18, CBC stable, cath planned today.  Goal of Therapy:  Heparin level 0.3-0.7 units/ml Monitor platelets by anticoagulation protocol: Yes   Plan:  Increase heparin gtt to 1650 units/h F/U post/cath  Fredonia Highland, PharmD, BCPS, [facility] Clinical Pharmacist [phone] Please check AMION for all [facility] Pharmacy numbers 06/19/2022

## 2022-06-19 NOTE — H&P (View-Only) (Signed)
 Rounding Note    Patient Name: Misty [name] Date of Encounter: 06/19/2022  [facility] Cardiologist: Janisa Labus, MD   Subjective   Patient was admitted over night for chest pain. She describes 3 days of chest pressure/ squeezing that radiated down her right arm. She also describes some numbness of right arm. It started off as intermittent pain and then became constant on day of admission. She was not able to pin point whether anything specific made it better or worse but at it both with rest and exertion. She is currently chest pain free on a Nitroglycerin drip. No shortness of breath. Her only complaint at this time is a mild headache which is likely due to the Nitroglycerin.  Inpatient Medications    Scheduled Meds:  insulin aspart  0-5 Units Subcutaneous QHS   insulin glargine-yfgn  5 Units Subcutaneous BID   lisinopril  10 mg Oral Daily   metoprolol tartrate  12.5 mg Oral BID   pantoprazole (PROTONIX) IV  40 mg Intravenous Q24H   rosuvastatin  20 mg Oral Daily   Continuous Infusions:  heparin 1,400 Units/hr (06/19/22 0026)   nitroGLYCERIN 20 mcg/min (06/18/22 2128)   PRN Meds: acetaminophen **OR** acetaminophen, HYDROmorphone (DILAUDID) injection, melatonin, naLOXone (NARCAN)  injection, nicotine, ondansetron (ZOFRAN) IV   Vital Signs    Vitals:   06/19/22 0114 06/19/22 0144 06/19/22 0219 06/19/22 0414  BP: 131/71 120/62  127/84  Pulse:  80  71  Resp:    18  Temp:    98 F (36.7 C)  TempSrc:    Oral  SpO2:  97%  97%  Weight:   111.6 kg   Height:        Intake/Output Summary (Last 24 hours) at 06/19/2022 0725 Last data filed at 06/19/2022 0600 Gross per 24 hour  Intake 244.97 ml  Output 700 ml  Net -455.03 ml      06/19/2022    2:19 AM 06/18/2022    9:30 PM 06/18/2022    5:53 PM  Last 3 Weights  Weight (lbs) 246 lb 246 lb 252 lb  Weight (kg) 111.585 kg 111.585 kg 114.306 kg      Telemetry    Sinus rhythm with resting rates in  the 60s to 70s but increases to the 90 to 110s with activity. - Personally Reviewed  ECG    No new ECG tracing today. - Personally Reviewed  Physical Exam   GEN: Obese African-American female in no acute distress.   Neck: No JVD. Cardiac: RRR. No murmurs, rubs, or gallops.  Respiratory: No increased work of breathing. Mild expiratory wheeze in bilateral bases. No rhonchi or rales. GI: Soft and non-distended. Mildly tender to palpation. MS: No lower extremity edema. No deformity. Skin: Warm and dry. Neuro:  No focal deficits. Psych: Normal affect. Responds appropriately.   Labs    High Sensitivity Troponin:   Recent Labs  Lab 06/18/22 1530 06/18/22 1735  TROPONINIHS 268* 336*     Chemistry Recent Labs  Lab 06/18/22 1530 06/18/22 2330  NA 134*  --   K 3.6  --   CL 99  --   CO2 21*  --   GLUCOSE 372*  --   BUN 8  --   CREATININE 0.85  --   CALCIUM 9.0  --   MG  --  1.8  GFRNONAA >60  --   ANIONGAP 14  --     Lipids No results for input(s): "CHOL", "TRIG", "HDL", "LABVLDL", "  LDLCALC", "CHOLHDL" in the last 168 hours.  Hematology Recent Labs  Lab 06/18/22 1530  WBC 8.8  RBC 4.90  HGB 13.1  HCT 42.1  MCV 85.9  MCH 26.7  MCHC 31.1  RDW 13.1  PLT 241   Thyroid No results for input(s): "TSH", "FREET4" in the last 168 hours.  BNPNo results for input(s): "BNP", "PROBNP" in the last 168 hours.  DDimer  Recent Labs  Lab 06/18/22 1739  DDIMER 0.27     Radiology    DG Chest 2 View  Result Date: 06/18/2022 CLINICAL DATA:  Chest pain. EXAM: CHEST - 2 VIEW COMPARISON:  Chest radiograph 11/08/2020. FINDINGS: The heart size and mediastinal contours are within normal limits. Both lungs are clear. The visualized skeletal structures are unremarkable. IMPRESSION: No active cardiopulmonary disease. Electronically Signed   By: Drew  Davis M.D.   On: 06/18/2022 16:22    Cardiac Studies   Coronary CTA 01/18/2022: Impressions: 1.  Coronary calcium score of 0. 2.   Normal coronary origin with right dominance. 3. Nonobstructive CAD, with noncalcified plaque in D1 causing minimal (0-24%) stenosis 4.  Mid LAD myocardial bridge  Patient Profile     41 y.o. female with a history of mild non-obstructive CAD noted on coronary CTA in 01/2022, hypertension, hyperlipidemia, type 2 diabetes mellitus, GERD, PCOS, and tobacco abuse who presented on 06/18/2022 with chest pain and was found to have elevated troponin. Cardiology consulted for further evaluation.  Assessment & Plan    Chest Pain with Elevated Troponin Non-Obstructive CAD Patient has a history of intermittent chest pain and has had multiple ED visits for this over the years. Recent coronary CTA in 01/2022 showed a coronary calcium score of 0 with non-obstructive CAD (non-calcified plaque in D1 causing 0-24% stenosis) and mid LAD myocardial bridging. She now presents with chest pain that she describes as a pressure/ squeezing sensation x3 days with some radiation down right arm. EKG showed no acute ischemic changes. High-sensitivity troponin elevated at 268 >> 336. D-dimer negative. CRP minimally elevated at 1.1 and Sed Rate normal. - Currently chest pain free. - Echo pending. - Currently on IV Heparin and IV Nitroglycerin. Continue IV Heparin for now but will try to wean her off IV Nitroglycerin. - Will increase Lopressor to 25mg twice daily. - I have a low suspicion for ACS given very reassuring coronary CTA 6 months ago. OK to continue Heparin for now while waiting for Echo results. She does describe some URI symptoms over the last few months so possibly myopericarditis so could consider cardiac MRI. Will discuss with MD.  Hypertension BP mildly elevated on admission but well controlled this morning on IV Nitroglycerin. - On Diltaizem 360mg daily and Lisinopril-HCTZ 10-12.5mg daily at home. - She was started on Lopressor rather than Diltiazem on admission given concern for ACS. Will increase this to 25mg  twice daily. - Continue Lisinopril 10mg daily.  Hyperlipidemia Lipid panel in 03/2021 Total Cholesterol 206, Triglycerides 139, HDL 35, LDL 146.  - Patient was started on Crestor 5mg daily after last labs. Continue.  - Will repeat lipid panel while here.  Type 2 Diabetes Mellitus - Management per primary team.   For questions or updates, please contact [facility] Please consult [email or web address] for contact info under        Signed, Callie E Goodrich, PA-C  06/19/2022, 7:25 AM     Personally seen and examined. Agree with above.  Troponins now 1200. NSTEMI until proven otherwise.  Despite recent coronary   CT scan with only soft plaque, we will proceed with cardiac catheterization.  Risks and benefits of been explained including stroke heart attack death renal impairment bleeding.  Willing to proceed.  Gladys Deckard, MD  

## 2022-06-19 NOTE — Progress Notes (Signed)
  Echocardiogram 2D Echocardiogram has been performed.  Janalyn Harder 06/19/2022, 3:19 PM

## 2022-06-19 NOTE — Inpatient Diabetes Management (Signed)
Inpatient Diabetes Program Recommendations  AACE/ADA: New Consensus Statement on Inpatient Glycemic Control (2015)  Target Ranges:  Prepandial:   less than 140 mg/dL      Peak postprandial:   less than 180 mg/dL (1-2 hours)      Critically ill patients:  140 - 180 mg/dL   Lab Results  Component Value Date   GLUCAP 210 (H) 06/19/2022   HGBA1C 12.0 (H) 06/18/2022    Review of Glycemic Control  Latest Reference Range & Units 06/18/22 17:35 06/18/22 21:29 06/19/22 07:35 06/19/22 11:21  Glucose-Capillary 70 - 99 mg/dL 161 (H) 096 (H) 045 (H) 210 (H)   Diabetes history: DM 2 Outpatient Diabetes medications: Lantus 10 units bid, Metformin 500 mg bid Current orders for Inpatient glycemic control:  Semglee 5 units bid Novolog 0-15 units qhs  Inpatient Diabetes Program Recommendations:    Note: do not see sliding scale  -  add Novolog 0-15 units tid.  Spoke with pt at bedside regarding A1c of 12% and glucose control at home. Pt reports being without her insulin for 3 weeks and just started taking the Lantus about 4 days ago. Pt said she just didn't want to take it. Discussed current A1c. Reviewed glucose and A1c goals. Encouraged compliance with medication and follow up with PCP. Discussed and reviewed lifestyle modification with diet.   Pt reports needing refills on all home supplies Glucose meter kit [id number] Insulin pen needles [id number]  Thanks,  Christena Deem RN, MSN, BC-ADM Inpatient Diabetes Coordinator Team Pager [phone] (8a-5p)

## 2022-06-19 NOTE — Interval H&P Note (Signed)
History and Physical Interval Note:  06/19/2022 12:10 PM  Misty [name]  has presented today for surgery, with the diagnosis of nstemi.  The various methods of treatment have been discussed with the patient and family. After consideration of risks, benefits and other options for treatment, the patient has consented to  Procedure(s): LEFT HEART CATH AND CORONARY ANGIOGRAPHY (N/A)  PERCUTANEOUS CORONARY INTERVENTION  as a surgical intervention.  The patient's history has been reviewed, patient examined, no change in status, stable for surgery.  I have reviewed the patient's chart and labs.  Questions were answered to the patient's satisfaction.    Cath Lab Visit (complete for each Cath Lab visit)  Clinical Evaluation Leading to the Procedure:   ACS: Yes.    Non-ACS:    Anginal Classification: CCS IV  Anti-ischemic medical therapy: Minimal Therapy (1 class of medications)  Non-Invasive Test Results: No non-invasive testing performed  Prior CABG: No previous CABG    Bryan Lemma, MD

## 2022-06-19 NOTE — Progress Notes (Signed)
PROGRESS NOTE Misty [name]  WUJ:[id number] DOB: 10-09-1980 DOA: 06/18/2022 PCP: [facility]  Brief Narrative/Hospital Course: 41yofw/ with HLD, diabetes, hypertension presented with 3 days of exertional substernal chest pressure improving with rest  In the ED: Vitals with BP in 160s/70s, not hypoxic,' Labs with mild hyponatremia bicarb 21 stable CBC blood glucose 372 hCG less than 5,cxr:clear.EKG with nonspecific T wave changes.  Troponin initially 268 --> 336. EDP discussed/consulted cardiology and patient was admitted for NSTEMI on heparin drip, nitroglycerin drip.  Subjective: Patient seen and examined this morning. Has mild headache attributing to nitroglycerin, no shortness of breath or chest pain.   Assessment and Plan: Principal Problem:   NSTEMI (non-ST elevated myocardial infarction) (HCC) Active Problems:   GERD (gastroesophageal reflux disease)   Tobacco abuse   Essential hypertension   Chest pain   DM2 (diabetes mellitus, type 2) (HCC)   Chest pain Suspected NSTEMI vs myopericarditis: Cardiology following - possible myopericarditis unable to rule out NSTEMI ACS.  Troponin has been elevated>  268> 336> this am 1230-most likely consistent with NSTEMI per cardiology.   Cont on heparin drip and nitroglycerin drip.Recent CTA coronaries overall normal in January of this year, cardiology further discussed with the patient and proceeding with cardiac cath, continue aspirin 81, lisinopril 10 added metoprolol 25, Crestor 20 mg, follow-up echocardiogram. CRP however is at 1.1, sed rate normal, D-dimer normal  Type 2 diabetes mellitus with uncontrolled hyperglycemia: HbA1c  AT12 reflecting poor outpatient control.  Reports taking long-acting insulin 10 units twice daily at home, not taking Trulicity recently, DM continue consulted, will uptitrate insulin regimen once able to take full mea, npo for cath. Cont current insulin for now Recent Labs  Lab  06/18/22 1735 06/18/22 2129 06/18/22 2330 06/19/22 0735  GLUCAP 275* 285*  --  305*  HGBA1C  --   --  12.0*  --     GERD continue PPI  Essential hypertension: Poorly controlled on admission, PTA on Cardizem lisinopril hydrochlorothiazide.  Continue lisinopril and added metoprolol -monitor and adjust  Chronic tobacco abuse cessation needed  Mild metabolic acidosis monitor labs  Class I Obesity:Patient's Body mass index is 32.46 kg/m. : Will benefit with PCP follow-up, weight loss  healthy lifestyle and outpatient sleep evaluation.   DVT prophylaxis:  Code Status:   Code Status: Full Code Family Communication: plan of care discussed with patient at bedside. Patient status is: Inpatient because of NSTEMI Level of care: Telemetry Cardiac   Dispo: The patient is from: HOME            Anticipated disposition: HOME  Objective: Vitals last 24 hrs: Vitals:   06/19/22 0144 06/19/22 0219 06/19/22 0414 06/19/22 0737  BP: 120/62  127/84 (!) 143/84  Pulse: 80  71   Resp:   18 18  Temp:   98 F (36.7 C) 98.2 F (36.8 C)  TempSrc:   Oral Oral  SpO2: 97%  97%   Weight:  111.6 kg    Height:       Weight change:   Physical Examination: General exam: alert awake, older than stated age HEENT:Oral mucosa moist, Ear/Nose WNL grossly Respiratory system: bilaterally CLEAR BS, no use of accessory muscle Cardiovascular system: S1 & S2 +, No JVD. Gastrointestinal system: Abdomen soft,NT,ND, BS+ Nervous System:Alert, awake, moving extremities. Extremities: LE edema NEG,distal peripheral pulses palpable.  Skin: No rashes,no icterus. MSK: Normal muscle bulk,tone, power  Medications reviewed:  Scheduled Meds:  aspirin EC  81 mg Oral Daily  insulin aspart  0-5 Units Subcutaneous QHS   insulin glargine-yfgn  5 Units Subcutaneous BID   lisinopril  10 mg Oral Daily   metoprolol tartrate  25 mg Oral BID   pantoprazole (PROTONIX) IV  40 mg Intravenous Q24H   rosuvastatin  20 mg Oral Daily    Continuous Infusions:  heparin 1,650 Units/hr (06/19/22 1102)   nitroGLYCERIN 20 mcg/min (06/18/22 2128)    Diet Order             Diet NPO time specified  Diet effective midnight                  Intake/Output Summary (Last 24 hours) at 06/19/2022 1104 Last data filed at 06/19/2022 1018 Gross per 24 hour  Intake 244.97 ml  Output 1300 ml  Net -1055.03 ml   Net IO Since Admission: -1,055.03 mL [06/19/22 1104]  Wt Readings from Last 3 Encounters:  06/19/22 111.6 kg  01/12/22 119.5 kg  11/08/20 125 kg     Unresulted Labs (From admission, onward)     Start     Ordered   06/20/22 0500  Heparin level (unfractionated)  Daily,   R     Question:  Specimen collection method  Answer:  Lab=Lab collect  See Hyperspace for full Linked Orders Report.   06/19/22 0022   06/19/22 0500  CBC  Daily,   R     Question:  Specimen collection method  Answer:  Lab=Lab collect  See Hyperspace for full Linked Orders Report.   06/19/22 0022          Data Reviewed: I have personally reviewed following labs and imaging studies CBC: Recent Labs  Lab 06/18/22 1530 06/19/22 0715  WBC 8.8 7.9  HGB 13.1 13.3  HCT 42.1 42.4  MCV 85.9 85.0  PLT 241 228   Basic Metabolic Panel: Recent Labs  Lab 06/18/22 1530 06/18/22 2330 06/19/22 0715  NA 134*  --  138  K 3.6  --  4.1  CL 99  --  105  CO2 21*  --  23  GLUCOSE 372*  --  299*  BUN 8  --  8  CREATININE 0.85  --  0.64  CALCIUM 9.0  --  8.6*  MG  --  1.8 1.9   GFR: Estimated Creatinine Clearance: 128.7 mL/min (by C-G formula based on SCr of 0.64 mg/dL). Liver Function Tests: Recent Labs  Lab 06/19/22 0715  AST 20  ALT 19  ALKPHOS 62  BILITOT 0.3  PROT 6.6  ALBUMIN 3.2*   Recent Labs  Lab 06/19/22 0715  LIPASE 30   Recent Labs    06/18/22 2330  HGBA1C 12.0*  CBG: Recent Labs  Lab 06/18/22 1735 06/18/22 2129 06/19/22 0735  GLUCAP 275* 285* 305*   No results found for this or any previous visit (from the  past 240 hour(s)).  Antimicrobials: Anti-infectives (From admission, onward)    None      Culture/Microbiology    Component Value Date/Time   SDES URINE, CLEAN CATCH 10/09/2018 0604   SPECREQUEST  10/09/2018 0604    ADDED 0604 Performed at [facility], [address], [place], [place] [address]    CULT  10/09/2018 0604    Multiple bacterial morphotypes present, none predominant. Suggest appropriate recollection if clinically indicated.   REPTSTATUS 10/10/2018 FINAL 10/09/2018 0604   Radiology Studies: DG Chest 2 View  Result Date: 06/18/2022 CLINICAL DATA:  Chest pain. EXAM: CHEST - 2 VIEW COMPARISON:  Chest radiograph 11/08/2020. FINDINGS: The heart size and mediastinal contours are within normal limits. Both lungs are clear. The visualized skeletal structures are unremarkable. IMPRESSION: No active cardiopulmonary disease. Electronically Signed   By: Annia Belt M.D.   On: 06/18/2022 16:22     LOS: 1 day   Lanae Boast, MD [facility]  06/19/2022, 11:04 AM

## 2022-06-19 NOTE — Progress Notes (Addendum)
Rounding Note    Patient Name: Misty [name] Date of Encounter: 06/19/2022  [facility] Cardiologist: Donato Schultz, MD   Subjective   Patient was admitted over night for chest pain. She describes 3 days of chest pressure/ squeezing that radiated down her right arm. She also describes some numbness of right arm. It started off as intermittent pain and then became constant on day of admission. She was not able to pin point whether anything specific made it better or worse but at it both with rest and exertion. She is currently chest pain free on a Nitroglycerin drip. No shortness of breath. Her only complaint at this time is a mild headache which is likely due to the Nitroglycerin.  Inpatient Medications    Scheduled Meds:  insulin aspart  0-5 Units Subcutaneous QHS   insulin glargine-yfgn  5 Units Subcutaneous BID   lisinopril  10 mg Oral Daily   metoprolol tartrate  12.5 mg Oral BID   pantoprazole (PROTONIX) IV  40 mg Intravenous Q24H   rosuvastatin  20 mg Oral Daily   Continuous Infusions:  heparin 1,400 Units/hr (06/19/22 0026)   nitroGLYCERIN 20 mcg/min (06/18/22 2128)   PRN Meds: acetaminophen **OR** acetaminophen, HYDROmorphone (DILAUDID) injection, melatonin, naLOXone (NARCAN)  injection, nicotine, ondansetron (ZOFRAN) IV   Vital Signs    Vitals:   06/19/22 0114 06/19/22 0144 06/19/22 0219 06/19/22 0414  BP: 131/71 120/62  127/84  Pulse:  80  71  Resp:    18  Temp:    98 F (36.7 C)  TempSrc:    Oral  SpO2:  97%  97%  Weight:   111.6 kg   Height:        Intake/Output Summary (Last 24 hours) at 06/19/2022 0725 Last data filed at 06/19/2022 0600 Gross per 24 hour  Intake 244.97 ml  Output 700 ml  Net -455.03 ml      06/19/2022    2:19 AM 06/18/2022    9:30 PM 06/18/2022    5:53 PM  Last 3 Weights  Weight (lbs) 246 lb 246 lb 252 lb  Weight (kg) 111.585 kg 111.585 kg 114.306 kg      Telemetry    Sinus rhythm with resting rates in  the 60s to 70s but increases to the 90 to 110s with activity. - Personally Reviewed  ECG    No new ECG tracing today. - Personally Reviewed  Physical Exam   GEN: Obese African-American female in no acute distress.   Neck: No JVD. Cardiac: RRR. No murmurs, rubs, or gallops.  Respiratory: No increased work of breathing. Mild expiratory wheeze in bilateral bases. No rhonchi or rales. GI: Soft and non-distended. Mildly tender to palpation. MS: No lower extremity edema. No deformity. Skin: Warm and dry. Neuro:  No focal deficits. Psych: Normal affect. Responds appropriately.   Labs    High Sensitivity Troponin:   Recent Labs  Lab 06/18/22 1530 06/18/22 1735  TROPONINIHS 268* 336*     Chemistry Recent Labs  Lab 06/18/22 1530 06/18/22 2330  NA 134*  --   K 3.6  --   CL 99  --   CO2 21*  --   GLUCOSE 372*  --   BUN 8  --   CREATININE 0.85  --   CALCIUM 9.0  --   MG  --  1.8  GFRNONAA >60  --   ANIONGAP 14  --     Lipids No results for input(s): "CHOL", "TRIG", "HDL", "LABVLDL", "  LDLCALC", "CHOLHDL" in the last 168 hours.  Hematology Recent Labs  Lab 06/18/22 1530  WBC 8.8  RBC 4.90  HGB 13.1  HCT 42.1  MCV 85.9  MCH 26.7  MCHC 31.1  RDW 13.1  PLT 241   Thyroid No results for input(s): "TSH", "FREET4" in the last 168 hours.  BNPNo results for input(s): "BNP", "PROBNP" in the last 168 hours.  DDimer  Recent Labs  Lab 06/18/22 1739  DDIMER 0.27     Radiology    DG Chest 2 View  Result Date: 06/18/2022 CLINICAL DATA:  Chest pain. EXAM: CHEST - 2 VIEW COMPARISON:  Chest radiograph 11/08/2020. FINDINGS: The heart size and mediastinal contours are within normal limits. Both lungs are clear. The visualized skeletal structures are unremarkable. IMPRESSION: No active cardiopulmonary disease. Electronically Signed   By: Annia Belt M.D.   On: 06/18/2022 16:22    Cardiac Studies   Coronary CTA 01/18/2022: Impressions: 1.  Coronary calcium score of 0. 2.   Normal coronary origin with right dominance. 3. Nonobstructive CAD, with noncalcified plaque in D1 causing minimal (0-24%) stenosis 4.  Mid LAD myocardial bridge  Patient Profile     42 y.o. female with a history of mild non-obstructive CAD noted on coronary CTA in 01/2022, hypertension, hyperlipidemia, type 2 diabetes mellitus, GERD, PCOS, and tobacco abuse who presented on 06/18/2022 with chest pain and was found to have elevated troponin. Cardiology consulted for further evaluation.  Assessment & Plan    Chest Pain with Elevated Troponin Non-Obstructive CAD Patient has a history of intermittent chest pain and has had multiple ED visits for this over the years. Recent coronary CTA in 01/2022 showed a coronary calcium score of 0 with non-obstructive CAD (non-calcified plaque in D1 causing 0-24% stenosis) and mid LAD myocardial bridging. She now presents with chest pain that she describes as a pressure/ squeezing sensation x3 days with some radiation down right arm. EKG showed no acute ischemic changes. High-sensitivity troponin elevated at 268 >> 336. D-dimer negative. CRP minimally elevated at 1.1 and Sed Rate normal. - Currently chest pain free. - Echo pending. - Currently on IV Heparin and IV Nitroglycerin. Continue IV Heparin for now but will try to wean her off IV Nitroglycerin. - Will increase Lopressor to 25mg  twice daily. - I have a low suspicion for ACS given very reassuring coronary CTA 6 months ago. OK to continue Heparin for now while waiting for Echo results. She does describe some URI symptoms over the last few months so possibly myopericarditis so could consider cardiac MRI. Will discuss with MD.  Hypertension BP mildly elevated on admission but well controlled this morning on IV Nitroglycerin. - On Diltaizem 360mg  daily and Lisinopril-HCTZ 10-12.5mg  daily at home. - She was started on Lopressor rather than Diltiazem on admission given concern for ACS. Will increase this to 25mg   twice daily. - Continue Lisinopril 10mg  daily.  Hyperlipidemia Lipid panel in 03/2021 Total Cholesterol 206, Triglycerides 139, HDL 35, LDL 146.  - Patient was started on Crestor 5mg  daily after last labs. Continue.  - Will repeat lipid panel while here.  Type 2 Diabetes Mellitus - Management per primary team.   For questions or updates, please contact [facility] Please consult [email or web address] for contact info under        Signed, Corrin Parker, PA-C  06/19/2022, 7:25 AM     Personally seen and examined. Agree with above.  Troponins now 1200. NSTEMI until proven otherwise.  Despite recent coronary  CT scan with only soft plaque, we will proceed with cardiac catheterization.  Risks and benefits of been explained including stroke heart attack death renal impairment bleeding.  Willing to proceed.  Donato Schultz, MD

## 2022-06-20 ENCOUNTER — Telehealth: Payer: Self-pay | Admitting: *Deleted

## 2022-06-20 ENCOUNTER — Encounter (HOSPITAL_COMMUNITY): Payer: Self-pay | Admitting: Cardiology

## 2022-06-20 DIAGNOSIS — Z79899 Other long term (current) drug therapy: Secondary | ICD-10-CM | POA: Diagnosis not present

## 2022-06-20 DIAGNOSIS — N914 Secondary oligomenorrhea: Secondary | ICD-10-CM | POA: Diagnosis not present

## 2022-06-20 DIAGNOSIS — E119 Type 2 diabetes mellitus without complications: Secondary | ICD-10-CM | POA: Diagnosis not present

## 2022-06-20 DIAGNOSIS — Z6832 Body mass index (BMI) 32.0-32.9, adult: Secondary | ICD-10-CM | POA: Diagnosis not present

## 2022-06-20 DIAGNOSIS — Z794 Long term (current) use of insulin: Secondary | ICD-10-CM | POA: Diagnosis not present

## 2022-06-20 DIAGNOSIS — M545 Low back pain, unspecified: Secondary | ICD-10-CM | POA: Diagnosis not present

## 2022-06-20 NOTE — Transitions of Care (Post Inpatient/ED Visit) (Signed)
06/20/2022  Name: Misty [name] MRN: [id number] DOB: 1980-03-12  Today's TOC FU Call Status: Today's TOC FU Call Status:: Successful TOC FU Call Competed TOC FU Call Complete Date: 06/20/22  Transition Care Management Follow-up Telephone Call Date of Discharge:  (Patient left AMA) Discharge Facility: [facility]) Type of Discharge: Inpatient Admission Primary Inpatient Discharge Diagnosis:: Left heart cath How have you been since you were released from the hospital?: Better Any questions or concerns?: Yes Patient Questions/Concerns:: Patient signed out AMA and is unsure of plan of care  Items Reviewed: Did you receive and understand the discharge instructions provided?: No (Patient signed out AMA) Medications obtained,verified, and reconciled?: Yes (Medications Reviewed) Any new allergies since your discharge?: No Dietary orders reviewed?: NA Do you have support at home?: Yes People in Home: significant other Name of Support/Comfort Primary Source: Fiance/Misty [name]  Medications Reviewed Today: Medications Reviewed Today     Reviewed by Heidi Dach, RN (Registered Nurse) on 06/20/22 at 1041  Med List Status: <None>   Medication Order Taking? Sig Documenting Provider Last Dose Status Informant  Accu-Chek FastClix Lancets MISC [id number] No Use twice per day to check your blood glucose.  E11.65  Patient not taking: Reported on 06/20/2022   [name], MD Not Taking Active Self, Pharmacy Records  acetaminophen (TYLENOL) 500 MG tablet [id number] Yes Take 1,500 mg by mouth daily. [provider] Taking Active Self, Pharmacy Records  diltiazem (CARDIZEM CD) 360 MG 24 hr capsule [id number] Yes Take 1 capsule (360 mg total) by mouth daily. [name], MD Taking Active Self, Pharmacy Records  glucose blood (ACCU-CHEK GUIDE) test strip [id number] No Use one strip, twice daily to check your blood glucose.  E11.65  Patient not taking: Reported on 06/20/2022    [name], MD Not Taking Active Self, Pharmacy Records  LANTUS SOLOSTAR 100 UNIT/ML Solostar Pen [id number] Yes Inject 10 Units into the skin daily. 10 units in the am, and 10 units at night. [provider] Taking Active Self, Pharmacy Records  lisinopril-hydrochlorothiazide (ZESTORETIC) 10-12.5 MG tablet [id number] Yes Take 1 tablet by mouth daily. [provider] Taking Active Self, Pharmacy Records  metFORMIN (GLUCOPHAGE) 500 MG tablet [id number] Yes Take 500 mg by mouth 2 (two) times daily. [provider] Taking Active Self, Pharmacy Records  oxyCODONE-acetaminophen (PERCOCET) 10-325 MG tablet [id number] Yes Take 1 tablet by mouth 4 (four) times daily as needed for pain. [provider] Taking Active Self, Pharmacy Records  pregabalin (LYRICA) 75 MG capsule [id number] Yes Take 75 mg by mouth 2 (two) times daily. [provider] Taking Active   rosuvastatin (CRESTOR) 5 MG tablet [id number] Yes Take 1 tablet (5 mg total) by mouth daily. [name], MD Taking Active Self, Pharmacy Records  simvastatin (ZOCOR) 20 MG tablet [id number] No Take 20 mg by mouth daily.  Patient not taking: Reported on 06/20/2022   [provider] Not Taking Active Self, Pharmacy Records           Med Note [place], AMY L   Sun Jun 18, 2022  5:52 PM) Pharmacist called Patient and told her it wasn't good to take this with her Blood Pressure medication, so she hasn't taken.   Vitamin D, Ergocalciferol, (DRISDOL) 1.25 MG (50000 UNIT) CAPS capsule [id number] No Take 50,000 Units by mouth once a week.  Patient not taking: Reported on 06/20/2022   [provider] Not Taking Active Self, Pharmacy Records  Home Care and Equipment/Supplies: Were Home Health Services Ordered?: NA Any new equipment or medical supplies ordered?: NA  Functional Questionnaire: Do you need assistance with bathing/showering or dressing?: No Do you need assistance with meal  preparation?: No Do you need assistance with eating?: No Do you have difficulty maintaining continence: No Do you need assistance with getting out of bed/getting out of a chair/moving?: No Do you have difficulty managing or taking your medications?: No  Follow up appointments reviewed: PCP Follow-up appointment confirmed?: NA Specialist Hospital Follow-up appointment confirmed?: Yes Date of Specialist follow-up appointment?: 06/23/22 Follow-Up Specialty Provider:: Heart Care Do you need transportation to your follow-up appointment?: No Do you understand care options if your condition(s) worsen?: Yes-patient verbalized understanding  SDOH Interventions Today    Flowsheet Row Most Recent Value  SDOH Interventions   Transportation Interventions Intervention Not Indicated      RNCM advised patient to schedule a hospital follow up with PCP and contact [facility] to update PCP information. Patient PCP is with [facility]. RNCM advised patient to attend [facility] Care appointment on 06/23/22 to discuss plan of care and any medication changes.  Estanislado Emms RN, BSN [facility]  Managed [facility] RN Care Coordinator [phone]

## 2022-06-22 NOTE — Progress Notes (Signed)
Cardiology Office Note:    Date:  06/23/2022  ID:  Misty [name], DOB 1980/01/03, MRN [id number] PCP: [facility], [facility]  [facility] Providers Cardiologist:  Donato Schultz, MD       Patient Profile:      Coronary artery disease  CCTA 01/18/22: CAC score 0, D1 0-24 (nonobstructive CAD), mLAD myocardial bridge  NSTEMI Cardiac catheterization 06/19/22: minimal CAD, EF 55-65, small to mod caliber vessels ?Vasospasm vs MINOCA  TTE 06/19/22: EF 55-60, no RWMA, NL RVSF, mild LAE, trivial MR, RAP 8 Diabetes mellitus  Hypertension  Hyperlipidemia  Tobacco use  PCOS GERD Palpitations  Zio 02/2022: NSR, rare SVT, ATach, Rare PACs, Rare PVCs, no AFib, sinus tachy 170s      History of Present Illness:   Misty [name] is a 42 y.o. female who returns for post hospital follow up. She was last seen by Dr. Anne Fu in 01/2022. CCTA at that time demonstrated minimal nonobstructive CAD. She was admitted 6/16-6/17 with chest pain and elevated hsTroponins felt to be c/w NSTEMI. Cath demonstrated minimal nonobstructive CAD. Echocardiogram showed normal EF. It was felt that she either had vasospasm or MINOCA. Meds were to be titrated prior to DC. However, the pt left AMA.  She is here alone.  She has not had any severe chest pain like she had that brought her to the hospital.  She has noted occasional left-sided chest squeezing.  This last for couple of minutes and then resolves.  She has not had exertional symptoms, shortness of breath, syncope, orthopnea, leg edema.  She has not had chest pain with lying supine or with taking a deep breath.  Review of Systems  Gastrointestinal:  Negative for hematochezia and melena.  Genitourinary:  Negative for hematuria.   see the HPI    Studies Reviewed:        Risk Assessment/Calculations:             Physical Exam:   VS:  BP 124/82   Pulse 88   Ht 6\' 1"  (1.854 m)   Wt 243 lb 12.8 oz (110.6 kg)   SpO2 98%   BMI 32.17 kg/m     Wt Readings from Last 3 Encounters:  06/23/22 243 lb 12.8 oz (110.6 kg)  06/19/22 246 lb (111.6 kg)  01/12/22 263 lb 6.4 oz (119.5 kg)    Constitutional:      Appearance: Healthy appearance. Not in distress.  Neck:     Vascular: JVD normal.  Pulmonary:     Breath sounds: Normal breath sounds. No wheezing. No rales.  Cardiovascular:     Normal rate. Regular rhythm.     Murmurs: There is no murmur.     Comments: Right wrist without hematoma Edema:    Peripheral edema absent.  Abdominal:     Palpations: Abdomen is soft.      ASSESSMENT AND PLAN:   NSTEMI (non-ST elevated myocardial infarction) [facility]) Patient presented with chest discomfort and elevated enzymes consistent with a non-ST elevation myocardial infarction with no obstructive coronary artery disease at cardiac catheterization [facility]).  Based upon her description, she likely has vasospasm.  We discussed the importance of eliminating/avoiding stimulants such as caffeine.  She continues to smoke.  I have recommended discontinuation of smoking.  She left the hospital before medications could be adjusted.   Start aspirin 81 mg daily.   EF was normal.  She does not need beta-blocker therapy.   For vasospasm, continue diltiazem 360 mg daily.   Start  Imdur 30 mg daily.   Increase Crestor to 20 mg daily.   Follow-up in 3 months.  Essential hypertension Blood pressure is controlled.  Continue diltiazem 360 mg daily, lisinopril/HCTZ 10/12.5 mg daily.  Pure hypercholesterolemia Goal LDL should be at least <70, ideally <55.  Increase Crestor to 20 mg daily.  Arrange CMET, lipids in 3 months.    Cardiac Rehabilitation Eligibility Assessment  The patient is ready to start cardiac rehabilitation from a cardiac standpoint.    Dispo:  Return in about 3 months (around 09/23/2022) for Routine Follow Up with Dr. Anne Fu, or Tereso Newcomer, PA-C.  Signed, Tereso Newcomer, PA-C

## 2022-06-23 ENCOUNTER — Encounter: Payer: Self-pay | Admitting: Physician Assistant

## 2022-06-23 ENCOUNTER — Ambulatory Visit: Payer: Medicaid Other | Attending: Physician Assistant | Admitting: Physician Assistant

## 2022-06-23 VITALS — BP 124/82 | HR 88 | Ht 73.0 in | Wt 243.8 lb

## 2022-06-23 DIAGNOSIS — E7800 Pure hypercholesterolemia, unspecified: Secondary | ICD-10-CM

## 2022-06-23 DIAGNOSIS — I10 Essential (primary) hypertension: Secondary | ICD-10-CM | POA: Diagnosis not present

## 2022-06-23 DIAGNOSIS — I214 Non-ST elevation (NSTEMI) myocardial infarction: Secondary | ICD-10-CM

## 2022-06-23 MED ORDER — ASPIRIN 81 MG PO TBEC: 81.0000 mg | DELAYED_RELEASE_TABLET | Freq: Every day | ORAL | 3 refills | Status: DC

## 2022-06-23 MED ORDER — ISOSORBIDE MONONITRATE ER 30 MG PO TB24: 30.0000 mg | ORAL_TABLET | Freq: Every day | ORAL | 3 refills | Status: DC

## 2022-06-23 MED ORDER — ROSUVASTATIN CALCIUM 20 MG PO TABS: 20.0000 mg | ORAL_TABLET | Freq: Every day | ORAL | 3 refills | Status: DC

## 2022-06-23 NOTE — Assessment & Plan Note (Signed)
Blood pressure is controlled.  Continue diltiazem 360 mg daily, lisinopril/HCTZ 10/12.5 mg daily.

## 2022-06-23 NOTE — Assessment & Plan Note (Signed)
Patient presented with chest discomfort and elevated enzymes consistent with a non-ST elevation myocardial infarction with no obstructive coronary artery disease at cardiac catheterization [facility]).  Based upon her description, she likely has vasospasm.  We discussed the importance of eliminating/avoiding stimulants such as caffeine.  She continues to smoke.  I have recommended discontinuation of smoking.  She left the hospital before medications could be adjusted.   Start aspirin 81 mg daily.   EF was normal.  She does not need beta-blocker therapy.   For vasospasm, continue diltiazem 360 mg daily.   Start Imdur 30 mg daily.   Increase Crestor to 20 mg daily.   Follow-up in 3 months.

## 2022-06-23 NOTE — Assessment & Plan Note (Signed)
Goal LDL should be at least <70, ideally <55.  Increase Crestor to 20 mg daily.  Arrange CMET, lipids in 3 months.

## 2022-06-23 NOTE — Patient Instructions (Signed)
Medication Instructions:  Your physician has recommended you make the following change in your medication:   START Aspirin 81 mg taking 1 daily  START Isosorbide (Imdur) 30 mg taking 1 daily 3.    INCREASE Rosuvastatin to 20 mg taking 1 daily  *If you need a refill on your cardiac medications before your next appointment, please call your pharmacy*   Lab Work: 3 MONTHS: FASTING LIPID & CMET  If you have labs (blood work) drawn today and your tests are completely normal, you will receive your results only by: MyChart Message (if you have MyChart) OR A paper copy in the mail If you have any lab test that is abnormal or we need to change your treatment, we will call you to review the results.   Testing/Procedures: None ordered   Follow-Up: At [facility], you and your health needs are our priority.  As part of our continuing mission to provide you with exceptional heart care, we have created designated Provider Care Teams.  These Care Teams include your primary Cardiologist (physician) and Advanced Practice Providers (APPs -  Physician Assistants and Nurse Practitioners) who all work together to provide you with the care you need, when you need it.  We recommend signing up for the patient portal called "MyChart".  Sign up information is provided on this After Visit Summary.  MyChart is used to connect with patients for Virtual Visits (Telemedicine).  Patients are able to view lab/test results, encounter notes, upcoming appointments, etc.  Non-urgent messages can be sent to your provider as well.   To learn more about what you can do with MyChart, go to [email or web address].    Your next appointment:   3 month(s)  Provider:   Donato Schultz, MD  or Tereso Newcomer, PA-C         Other Instructions

## 2022-06-26 DIAGNOSIS — Z79899 Other long term (current) drug therapy: Secondary | ICD-10-CM | POA: Diagnosis not present

## 2022-06-26 NOTE — Discharge Summary (Signed)
AMA  Patient during evening expressed desire to leave the Hospital immidiately, patient has been warned that this is not Medically advisable at this time, and can result in Medical complications like Death and Disability, patient understood and accepted the risks involved and assumes full responsibilty of this decision. staffs made their best effort to convince patient to stay.  Pt was post cath and awaiting on med adjustment.  Lanae Boast M.D on 06/26/2022 at 7:19 AM  [facility]  Time < 30 minutes  Last Note Below   PROGRESS NOTE Misty [name]  ZOX:[id number] DOB: November 21, 1980 DOA: 06/18/2022 PCP: [facility]  Brief Narrative/Hospital Course: 41yofw/ with HLD, diabetes, hypertension presented with 3 days of exertional substernal chest pressure improving with rest  In the ED: Vitals with BP in 160s/70s, not hypoxic,' Labs with mild hyponatremia bicarb 21 stable CBC blood glucose 372 hCG less than 5,cxr:clear.EKG with nonspecific T wave changes.  Troponin initially 268 --> 336. EDP discussed/consulted cardiology and patient was admitted for NSTEMI on heparin drip, nitroglycerin drip.  Subjective: Patient seen and examined this morning. Has mild headache attributing to nitroglycerin, no shortness of breath or chest pain.   Assessment and Plan: Principal Problem:   NSTEMI (non-ST elevated myocardial infarction) (HCC) Active Problems:   GERD (gastroesophageal reflux disease)   Tobacco abuse   Essential hypertension   Chest pain   DM2 (diabetes mellitus, type 2) (HCC)   Chest pain Suspected NSTEMI vs myopericarditis: Cardiology following - possible myopericarditis unable to rule out NSTEMI ACS.  Troponin has been elevated>  268> 336> this am 1230-most likely consistent with NSTEMI per cardiology.   Cont on heparin  drip and nitroglycerin drip.Recent CTA coronaries overall normal in January of this year, cardiology further discussed with the patient and proceeding with cardiac cath, continue aspirin 81, lisinopril 10 added metoprolol 25, Crestor 20 mg, follow-up echocardiogram. CRP however is at 1.1, sed rate normal, D-dimer normal  Type 2 diabetes mellitus with uncontrolled hyperglycemia: HbA1c  AT12 reflecting poor outpatient control.  Reports taking long-acting insulin 10 units twice daily at home, not taking Trulicity recently, DM continue consulted, will uptitrate insulin regimen once able to take full mea, npo for cath. Cont current insulin for now No results for input(s): "GLUCAP", "HGBA1C" in the last 168 hours.   GERD continue PPI  Essential hypertension: Poorly controlled on admission, PTA on Cardizem lisinopril hydrochlorothiazide.  Continue lisinopril and added metoprolol -monitor and adjust  Chronic tobacco abuse cessation needed  Mild metabolic acidosis monitor labs  Class I Obesity:Patient's Body mass index is 32.46 kg/m. : Will benefit with PCP follow-up, weight loss  healthy lifestyle and outpatient sleep evaluation.   DVT prophylaxis:  Code Status:   Code Status: Prior Family Communication: plan of care discussed with patient at bedside. Patient status is: Inpatient because of NSTEMI Level of care: Telemetry Cardiac   Dispo: The patient is from: HOME            Anticipated disposition: HOME  Objective: Vitals last 24 hrs: Vitals:   06/19/22 1319 06/19/22 1324 06/19/22 1329 06/19/22 1615  BP: 130/77 137/78 137/78 (!) 143/99  Pulse: 80 81 (!) 0  Resp: (!) 27 20  18   Temp:    98.2 F (36.8 C)  TempSrc:    Oral  SpO2: 99% 100%    Weight:      Height:       Weight change:   Physical Examination: General exam: alert awake, older than stated age HEENT:Oral mucosa moist, Ear/Nose WNL grossly Respiratory system: bilaterally CLEAR BS, no use of accessory  muscle Cardiovascular system: S1 & S2 +, No JVD. Gastrointestinal system: Abdomen soft,NT,ND, BS+ Nervous System:Alert, awake, moving extremities. Extremities: LE edema NEG,distal peripheral pulses palpable.  Skin: No rashes,no icterus. MSK: Normal muscle bulk,tone, power  Medications reviewed:  Scheduled Meds:   Continuous Infusions:    Diet Order     None     No intake or output data in the 24 hours ending 06/27/22 1343  Net IO Since Admission: -1,505.03 mL [06/27/22 1343]  Wt Readings from Last 3 Encounters:  06/23/22 110.6 kg  06/19/22 111.6 kg  01/12/22 119.5 kg     Unresulted Labs (From admission, onward)    None     Data Reviewed: I have personally reviewed following labs and imaging studies CBC: No results for input(s): "WBC", "NEUTROABS", "HGB", "HCT", "MCV", "PLT" in the last 168 hours.  Basic Metabolic Panel: No results for input(s): "NA", "K", "CL", "CO2", "GLUCOSE", "BUN", "CREATININE", "CALCIUM", "MG", "PHOS" in the last 168 hours.  GFR: Estimated Creatinine Clearance: 128.7 mL/min (by C-G formula based on SCr of 0.64 mg/dL). Liver Function Tests: No results for input(s): "AST", "ALT", "ALKPHOS", "BILITOT", "PROT", "ALBUMIN" in the last 168 hours.  No results for input(s): "LIPASE", "AMYLASE" in the last 168 hours.  No results for input(s): "HGBA1C" in the last 72 hours. CBG: No results for input(s): "GLUCAP" in the last 168 hours.  No results found for this or any previous visit (from the past 240 hour(s)).  Antimicrobials: Anti-infectives (From admission, onward)    None      Culture/Microbiology    Component Value Date/Time   SDES URINE, CLEAN CATCH 10/09/2018 0604   SPECREQUEST  10/09/2018 0604    ADDED 0604 Performed at [facility], [address], [place], [place] [address]    CULT  10/09/2018 0604    Multiple bacterial morphotypes present, none predominant. Suggest appropriate recollection if clinically indicated.    REPTSTATUS 10/10/2018 FINAL 10/09/2018 0604   Radiology Studies: No results found.   LOS: 1 day   Lanae Boast, MD [facility]  06/27/2022, 1:43 PM

## 2022-06-27 ENCOUNTER — Encounter (HOSPITAL_COMMUNITY): Payer: Self-pay

## 2022-07-03 DIAGNOSIS — Z419 Encounter for procedure for purposes other than remedying health state, unspecified: Secondary | ICD-10-CM | POA: Diagnosis not present

## 2022-07-12 ENCOUNTER — Telehealth (HOSPITAL_COMMUNITY): Payer: Self-pay

## 2022-07-12 NOTE — Telephone Encounter (Signed)
Unable to reach pt. No response from pt in regards to cardiac rehab.   Closed referral.

## 2022-07-14 ENCOUNTER — Other Ambulatory Visit: Payer: Self-pay

## 2022-07-14 ENCOUNTER — Ambulatory Visit (HOSPITAL_COMMUNITY)
Admission: RE | Admit: 2022-07-14 | Discharge: 2022-07-14 | Disposition: A | Discharge status: Home / Self Care | Payer: Medicaid Other | Source: Ambulatory Visit | Attending: Internal Medicine | Admitting: Internal Medicine

## 2022-07-14 ENCOUNTER — Ambulatory Visit (INDEPENDENT_AMBULATORY_CARE_PROVIDER_SITE_OTHER): Payer: Medicaid Other | Admitting: Student

## 2022-07-14 ENCOUNTER — Encounter: Payer: Self-pay | Admitting: Student

## 2022-07-14 VITALS — BP 114/57 | HR 87 | Temp 98.4°F | Ht 73.0 in | Wt 245.1 lb

## 2022-07-14 DIAGNOSIS — Z7985 Long-term (current) use of injectable non-insulin antidiabetic drugs: Secondary | ICD-10-CM | POA: Diagnosis not present

## 2022-07-14 DIAGNOSIS — I201 Angina pectoris with documented spasm: Secondary | ICD-10-CM | POA: Insufficient documentation

## 2022-07-14 DIAGNOSIS — F17210 Nicotine dependence, cigarettes, uncomplicated: Secondary | ICD-10-CM | POA: Diagnosis not present

## 2022-07-14 DIAGNOSIS — E1140 Type 2 diabetes mellitus with diabetic neuropathy, unspecified: Secondary | ICD-10-CM

## 2022-07-14 DIAGNOSIS — G8929 Other chronic pain: Secondary | ICD-10-CM | POA: Diagnosis not present

## 2022-07-14 DIAGNOSIS — Z720 Tobacco use: Secondary | ICD-10-CM

## 2022-07-14 DIAGNOSIS — Z1321 Encounter for screening for nutritional disorder: Secondary | ICD-10-CM

## 2022-07-14 DIAGNOSIS — I2081 Angina pectoris with coronary microvascular dysfunction: Secondary | ICD-10-CM | POA: Insufficient documentation

## 2022-07-14 DIAGNOSIS — I10 Essential (primary) hypertension: Secondary | ICD-10-CM

## 2022-07-14 DIAGNOSIS — Z794 Long term (current) use of insulin: Secondary | ICD-10-CM

## 2022-07-14 LAB — POCT GLYCOSYLATED HEMOGLOBIN (HGB A1C): Hemoglobin A1C: 11.4 % — AB (ref 4.0–5.6)

## 2022-07-14 LAB — GLUCOSE, CAPILLARY: Glucose-Capillary: 300 mg/dL — ABNORMAL HIGH (ref 70–99)

## 2022-07-14 MED ORDER — NICOTINE 14 MG/24HR TD PT24
14.0000 mg | MEDICATED_PATCH | TRANSDERMAL | 0 refills | Status: AC
Start: 2022-07-14 — End: 2023-07-14

## 2022-07-14 MED ORDER — ACCU-CHEK GUIDE W/DEVICE KIT
PACK | 0 refills | Status: AC
Start: 2022-07-14 — End: ?

## 2022-07-14 MED ORDER — LISINOPRIL-HYDROCHLOROTHIAZIDE 10-12.5 MG PO TABS
1.0000 | ORAL_TABLET | Freq: Every day | ORAL | 3 refills | Status: DC
Start: 2022-07-14 — End: 2022-07-19

## 2022-07-14 MED ORDER — FREESTYLE LIBRE 3 SENSOR MISC
3 refills | Status: AC
Start: 2022-07-14 — End: ?

## 2022-07-14 MED ORDER — SEMAGLUTIDE(0.25 OR 0.5MG/DOS) 2 MG/3ML ~~LOC~~ SOPN: 0.2500 mg | PEN_INJECTOR | SUBCUTANEOUS | 3 refills | Status: DC

## 2022-07-14 NOTE — Assessment & Plan Note (Signed)
Pain generator is her arthritis, which causes her back and her knees to hurt. She takes oxycodone-acetaminophen 10-325 mg QID prescribed by [facility].

## 2022-07-14 NOTE — Patient Instructions (Signed)
Remember to bring all of the medications that you take (including over the counter medications and supplements) with you to every clinic visit.  This after visit summary is an important review of tests, referrals, and medication changes that were discussed during your visit. If you have questions or concerns, call [phone]. Outside of clinic business hours, call the main hospital at [phone] and ask the operator for the on-call internal medicine resident.   Marrianne Mood MD 07/14/2022, 12:06 PM

## 2022-07-14 NOTE — Assessment & Plan Note (Addendum)
1-2 ppd for > 20 years. Prior quit attempts: no Triggers and coping strategies: stress, depression, pain. She doesn't have coping strategies. Planned quit date: 08/03/22 Pharmacotherapy: nicotine patches. 3-10 minutes spent on counseling.

## 2022-07-14 NOTE — Assessment & Plan Note (Addendum)
Poorly controlled. A1c 11.7. On Lantus 20 units daily and metformin. Did well with Trulicity in the past but pharmacy no longer stocks/can't supply this medicine. No glucometer at present. Today will order CGM and replacement glucometer. Start Ozempic 0.5 mg. Return in 2 weeks for CGM interpretation and medication titration.

## 2022-07-14 NOTE — Progress Notes (Signed)
Subjective:  Misty [name] is a 42 y.o. 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 Some continued chest pain, but mild. Not the same pain as when she had her heart attack. Gets worse when she walks, improves when she sits down. Feels like her heart is jumping out of her chest. Feels at times like a pressure or squeezing. Sometimes associated with shortness of breath. No diaphoresis. No nausea or vomiting.  Review of Systems  Constitutional:  Positive for diaphoresis (Night sweats), malaise/fatigue and weight loss. Negative for chills and fever.  Respiratory:  Negative for cough.   Gastrointestinal:  Negative for blood in stool, melena, nausea and vomiting.  Genitourinary:  Negative for dysuria.  Musculoskeletal:  Positive for joint pain (Chronic). Negative for myalgias.  Neurological:  Negative for dizziness.  Endo/Heme/Allergies:  Does not bruise/bleed easily.   Objective:   Vitals:   07/14/22 1043 07/14/22 1051  BP: (!) 129/55 (!) 114/57  Pulse: 93 87  Temp: 98.4 F (36.9 C)   TempSrc: Oral   SpO2: 100%   Weight: 245 lb 1.6 oz (111.2 kg)   Height: 6\' 1"  (1.854 m)     Physical Exam Well-appearing, no distress Neck supple without lymphadenopathy Heart rate normal, rhythm regular, radial pulses are strong, DP and PT pulses intact Breathing regular and unlabored, no wheezing or crackles Skin warm and dry Alert and oriented, abnormal monofilament testing bilateral feet  Assessment & Plan:   Problem List Items Addressed This Visit     Tobacco abuse (Chronic)    1-2 ppd for > 20 years. Prior quit attempts: no Triggers and coping strategies: stress, depression, pain. She doesn't have coping strategies. Planned quit date: 08/03/22 Pharmacotherapy: nicotine patches. 3-10 minutes spent on counseling.       Relevant Medications   nicotine (NICODERM CQ - DOSED IN MG/24 HOURS) 14 mg/24hr patch    Essential hypertension (Chronic)    Well controlled. No changes to regimen.      Relevant Medications   lisinopril-hydrochlorothiazide (ZESTORETIC) 10-12.5 MG tablet   Diabetes mellitus with neuropathy (HCC) (Chronic)    Poorly controlled. A1c 11.7. On Lantus 20 units daily and metformin. Did well with Trulicity in the past but pharmacy no longer stocks/can't supply this medicine. No glucometer at present. Today will order CGM and replacement glucometer. Start Ozempic 0.5 mg. Return in 2 weeks for CGM interpretation and medication titration.      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 An EKG obtained in clinic is reassuring but notable for non-specific T-wave flattening/inversion in V1 and V2 in addition to Q-waves in V1 and V2--these findings are not substantially changed from priors. No indication for ED transfer. Blood pressure today leaves no room to increase Imdur. Continue diltiazem. Avoid vasospastic precipitants like caffeine. Striving for tobacco cessation. Will message consulting cardiologist for recommendation and follow-up.  Relevant Medications   lisinopril-hydrochlorothiazide (ZESTORETIC) 10-12.5 MG tablet   Other Relevant Orders   EKG 12-Lead (Completed)   Other chronic pain (Chronic)    Pain generator is her arthritis, which causes her back and her knees to hurt. She  takes oxycodone-acetaminophen 10-325 mg QID prescribed by [facility].      Relevant Medications   oxyCODONE-acetaminophen (PERCOCET) 10-325 MG tablet   Other Visit Diagnoses     Encounter for vitamin deficiency screening       Relevant Orders   Vitamin D (25 hydroxy)       Return in 2 weeks for diabetes and chest pain follow-up.  Patient discussed with Dr. Jan Fireman MD 07/14/2022, 3:10 PM  [phone]

## 2022-07-14 NOTE — Assessment & Plan Note (Addendum)
She is overall doing better since hospitalization but since then continues to have mild exertional chest pain that sounds fairly typical for angina despite normal appearing coronary arteries on angiography. An EKG obtained in clinic is reassuring but notable for non-specific T-wave flattening/inversion in V1 and V2 in addition to Q-waves in V1 and V2--these findings are not substantially changed from priors. No indication for ED transfer. Blood pressure today leaves no room to increase Imdur. Continue diltiazem. Avoid vasospastic precipitants like caffeine. Striving for tobacco cessation. Will message consulting cardiologist for recommendation and follow-up.

## 2022-07-14 NOTE — Assessment & Plan Note (Signed)
Well-controlled.  No changes to regimen. 

## 2022-07-15 LAB — VITAMIN D 25 HYDROXY (VIT D DEFICIENCY, FRACTURES): Vit D, 25-Hydroxy: 20.4 ng/mL — ABNORMAL LOW (ref 30.0–100.0)

## 2022-07-16 ENCOUNTER — Telehealth: Payer: Self-pay | Admitting: Physician Assistant

## 2022-07-16 NOTE — Telephone Encounter (Signed)
Hi Dr. 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 I think her BP could tolerate Imdur 45 mg. We can reduce Lisinopril/hydrochlorothiazide to Lisinopril alone if BP too low. If she continues to have sx's on max dose Nitrates and calcium channel blocker, we can try Ranolazine. We can see her back to continue to titrate meds.  PLAN: Triage - -Increase Imdur to 45 mg once daily  -Arrange follow up with Dr. Anne Fu, me or another APP in the next 1-2 weeks. -I can see her at 9:40 am on Wed 7/17 if there are no other appts available Tereso Newcomer, PA-C    07/16/2022 10:15 PM

## 2022-07-16 NOTE — Telephone Encounter (Signed)
-----   Message from Marrianne Mood sent at 07/14/2022  3:13 PM EDT ----- Regarding: Question about mutual patient Hi Lorin Picket,  Saw this patient in primary care clinic today. She reports some continued chest pain since discharge from hospital for [facility] due to vasospasm. Has some angina-like features. EKG looked reassuring. BP leaves no room to increase Imdur. Did she bring this up during your visit with her? Any recommendations? Didn't send her to ED today because seems like pain has been stable and not worsening. Can't wrap my head around why she would have angina with angiographically normal coronaries. Don't think vasospasm would cause this kind of continued pain, do you?  Thanks for your insight, Marrianne Mood MD 07/14/2022, 3:17 PM

## 2022-07-17 ENCOUNTER — Encounter: Payer: Self-pay | Admitting: Student

## 2022-07-17 ENCOUNTER — Telehealth: Payer: Self-pay

## 2022-07-17 ENCOUNTER — Other Ambulatory Visit: Payer: Self-pay | Admitting: Student

## 2022-07-17 DIAGNOSIS — E559 Vitamin D deficiency, unspecified: Secondary | ICD-10-CM

## 2022-07-17 MED ORDER — ISOSORBIDE MONONITRATE ER 30 MG PO TB24: 45.0000 mg | ORAL_TABLET | Freq: Every day | ORAL | Status: DC

## 2022-07-17 MED ORDER — VITAMIN D2 10 MCG (400 UNIT) PO TABS
2.0000 | ORAL_TABLET | Freq: Every day | ORAL | 3 refills | Status: AC
Start: 2022-07-17 — End: ?

## 2022-07-17 NOTE — Telephone Encounter (Signed)
Decision:Approved [name] (Key: [id number]) PA Case ID #: [id number] Rx #: [id number] Need Help? Call us at [phone] Outcome Approved today Approved. This drug has been approved. Approved quantity: 2 units per 28 day(s). The drug has been approved from 07/03/2022 to 01/13/2023. Please call the pharmacy to process your prescription claim. Generic or biosimilar substitution may be required when available and preferred on the formulary. Authorization Expiration Date: 01/13/2023 Drug FreeStyle Libre 3 Sensor ePA cloud logo Form [place] [employer] of [facility] Electronic Prior Authorization Request Form [phone] NCPDP) Original Claim Info 75 Submit 3 DS For Emerg Fill with PA Type01, PA Number [id number], Level of Service 3

## 2022-07-17 NOTE — Telephone Encounter (Signed)
Prior Authorization for patient (Freestyle Libre 3 Sensor) came through on cover my meds was submitted with last office notes and labs awaiting approval or denial.  VWU:[id number]

## 2022-07-17 NOTE — Telephone Encounter (Signed)
Decision:Approved [name] (Key: [id number]) Rx #: [id number] Ozempic (0.25 or 0.5 MG/DOSE) 2MG /3ML pen-injectors Form [employer] of [employer] Prior Authorization Request Form [id number] NCPDP) Created Message from Plan Approved. This drug has been approved. Approved quantity: 3 milliliters per 28 day(s). You may fill up to a 34 day supply at a retail pharmacy. You may fill up to a 90 day supply for maintenance drugs, please refer to the formulary for details. Please call the pharmacy to process your prescription claim.. Authorization Expiration Date: July 17, 2023.

## 2022-07-17 NOTE — Telephone Encounter (Signed)
Prior Authorization for patient (Ozempic) came through on cover my meds was submitted with last office notes and labs awaiting approval or denial.  ZOX:[id number]

## 2022-07-17 NOTE — Telephone Encounter (Signed)
Left detailed message (OK per DPR) with the following information:  Dr. Benito Mccreedy notified Tereso Newcomer, PA-C of patient having continued chest pain. Recommendation from Lorin Picket is to increase Imdur to 45mg  daily and follow-up in 1-2 weeks.   No appointments available, patient scheduled to see Tereso Newcomer, PA-C on 07/19/22 at 9:40am (OK per Lorin Picket).  Provided office number for callback.  Also sent patient MyChart message with recommendations.

## 2022-07-18 DIAGNOSIS — M542 Cervicalgia: Secondary | ICD-10-CM | POA: Diagnosis not present

## 2022-07-18 DIAGNOSIS — R892 Abnormal level of other drugs, medicaments and biological substances in specimens from other organs, systems and tissues: Secondary | ICD-10-CM | POA: Diagnosis not present

## 2022-07-18 DIAGNOSIS — Z6832 Body mass index (BMI) 32.0-32.9, adult: Secondary | ICD-10-CM | POA: Diagnosis not present

## 2022-07-18 DIAGNOSIS — Z79899 Other long term (current) drug therapy: Secondary | ICD-10-CM | POA: Diagnosis not present

## 2022-07-18 DIAGNOSIS — M545 Low back pain, unspecified: Secondary | ICD-10-CM | POA: Diagnosis not present

## 2022-07-18 NOTE — Telephone Encounter (Signed)
Call placed to pt regarding message below.  Left another message for pt to call the office.

## 2022-07-18 NOTE — Progress Notes (Unsigned)
Cardiology Office Note:    Date:  07/19/2022  ID:  Misty [name], DOB [birth year], MRN [id number] PCP: Marrianne Mood, MD  [facility] Providers Cardiologist:  Donato Schultz, MD       Patient Profile:      Coronary artery disease  CCTA 01/18/22: CAC score 0, D1 0-24 (nonobstructive CAD), mLAD myocardial bridge  NSTEMI Cardiac catheterization 06/19/22: minimal CAD, EF 55-65, small to mod caliber vessels ?Vasospasm vs MINOCA  TTE 06/19/22: EF 55-60, no RWMA, NL RVSF, mild LAE, trivial MR, RAP 8 Diabetes mellitus  Hypertension  Hyperlipidemia  Tobacco use  PCOS GERD Palpitations  Zio 02/2022: NSR, rare SVT, ATach, Rare PACs, Rare PVCs, no AFib, sinus tachy 170s      Discussed the use of AI scribe software for clinical note transcription with the patient, who gave verbal consent to proceed.   History of Present Illness   [name] with a history of minimal CAD and normal EF presents for follow-up of angina. She has been on Imdur 30 mg daily for angina management, which was recently increased to 45 mg due to continued symptoms of exertional angina. Despite the medication adjustment, she continues to experience chest discomfort, which occurs both randomly and during activities such as bending down or lifting. The discomfort is located in the center and left side of her chest and sometimes radiates down her side. The patient also reports left-sided chest pain that occurs daily. She notes exertional chest pain that resolves with rest. These symptoms were present even before her recent hospitalization. She has not had shortness of breath, orthopnea, leg edema. She is a current smoker and is considering using nicotine patches to quit.     ROS: see hpi    Studies Reviewed:   EKG Interpretation Date/Time:  Wednesday July 19 2022 09:57:45 EDT Ventricular Rate:  82 PR Interval:  118 QRS Duration:  86 QT Interval:  376 QTC Calculation: 439 R Axis:   -38  Text  Interpretation: Normal sinus rhythm Left axis deviation Septal Q waves No acute ST-TW changes No change from prior ECG Confirmed by Tereso Newcomer [phone]) on 07/19/2022 10:03:55 AM    Risk Assessment/Calculations:             Physical Exam:   VS:  BP 110/62   Pulse 82   Ht 6\' 1"  (1.854 m)   Wt 240 lb 9.6 oz (109.1 kg)   SpO2 98%   BMI 31.74 kg/m    Wt Readings from Last 3 Encounters:  07/19/22 240 lb 9.6 oz (109.1 kg)  07/14/22 245 lb 1.6 oz (111.2 kg)  06/23/22 243 lb 12.8 oz (110.6 kg)    Physical Exam   GENERAL: Well nourished, well developed, no acute distress. NECK: No jugular venous distention. CHEST: Lungs clear to auscultation bilaterally. CARDIOVASCULAR: Regular rate and rhythm, no murmur. EXTREMITIES: Without edema.         ASSESSMENT AND PLAN:   Angina pectoris with coronary microvascular dysfunction Persistent exertional and rest angina despite Imdur 45mg  daily, Cardizem 360 mg daily. Discussed the potential causes of her symptoms (spasm vs microvascular disease). Symptoms improved slightly with Imdur increase to 45mg  daily. -Increase Imdur to 60mg  daily. -Stop Lisinopril-HCTZ and start Lisinopril 10mg  daily to allow for further increase in Imdur. -Consider adding a beta-blocker or Ranexa if symptoms persist. -Consider changing Imdur to NTG patch if symptoms persist. -Provide nitroglycerin tablets for acute episodes of chest pain.  Pure hypercholesterolemia LDL elevated at 146 despite rosuvastatin.  Goal LDL <70, ideally <55. -Continue rosuvastatin 20mg  daily. -Recheck lipid panel in September 2024.  Tobacco abuse Current smoker. Discussed the potential benefits of nicotine patches to aid in smoking cessation. -Encourage use of nicotine patches and gradual cessation of smoking.  Essential hypertension BP controlled. Adjust meds as noted to allow for increase in Imdur.    Dispo:  Return in about 8 weeks (around 09/13/2022) for Scheduled Follow Up, w/ Tereso Newcomer, PA-C.  Signed, Tereso Newcomer, PA-C

## 2022-07-19 ENCOUNTER — Encounter: Payer: Self-pay | Admitting: Physician Assistant

## 2022-07-19 ENCOUNTER — Ambulatory Visit: Payer: Medicaid Other | Attending: Physician Assistant | Admitting: Physician Assistant

## 2022-07-19 VITALS — BP 110/62 | HR 82 | Ht 73.0 in | Wt 240.6 lb

## 2022-07-19 DIAGNOSIS — I10 Essential (primary) hypertension: Secondary | ICD-10-CM | POA: Diagnosis not present

## 2022-07-19 DIAGNOSIS — E7800 Pure hypercholesterolemia, unspecified: Secondary | ICD-10-CM

## 2022-07-19 DIAGNOSIS — I2081 Angina pectoris with coronary microvascular dysfunction: Secondary | ICD-10-CM

## 2022-07-19 DIAGNOSIS — Z720 Tobacco use: Secondary | ICD-10-CM

## 2022-07-19 MED ORDER — NITROGLYCERIN 0.4 MG SL SUBL: 0.4000 mg | SUBLINGUAL_TABLET | SUBLINGUAL | 3 refills | Status: AC | PRN

## 2022-07-19 MED ORDER — ISOSORBIDE MONONITRATE ER 60 MG PO TB24: 60.0000 mg | ORAL_TABLET | Freq: Every day | ORAL | 3 refills | Status: DC

## 2022-07-19 MED ORDER — LISINOPRIL 10 MG PO TABS: 10.0000 mg | ORAL_TABLET | Freq: Every day | ORAL | 3 refills | Status: DC

## 2022-07-19 NOTE — Telephone Encounter (Signed)
Looks like pt got a Wellsite geologist and it was read.   Pt is seeing Tereso Newcomer, PA-C today, so will be discussed at that time.

## 2022-07-19 NOTE — Assessment & Plan Note (Signed)
Persistent exertional and rest angina despite Imdur 45mg  daily, Cardizem 360 mg daily. Discussed the potential causes of her symptoms (spasm vs microvascular disease). Symptoms improved slightly with Imdur increase to 45mg  daily. -Increase Imdur to 60mg  daily. -Stop Lisinopril-HCTZ and start Lisinopril 10mg  daily to allow for further increase in Imdur. -Consider adding a beta-blocker or Ranexa if symptoms persist. -Consider changing Imdur to NTG patch if symptoms persist. -Provide nitroglycerin tablets for acute episodes of chest pain.

## 2022-07-19 NOTE — Progress Notes (Signed)
Internal Medicine Clinic Attending  Case discussed with Dr. McLendon  At the time of the visit.  We reviewed the resident's history and exam and pertinent patient test results.  I agree with the assessment, diagnosis, and plan of care documented in the resident's note.  

## 2022-07-19 NOTE — Assessment & Plan Note (Signed)
Current smoker. Discussed the potential benefits of nicotine patches to aid in smoking cessation. -Encourage use of nicotine patches and gradual cessation of smoking.

## 2022-07-19 NOTE — Assessment & Plan Note (Signed)
BP controlled. Adjust meds as noted to allow for increase in Imdur.

## 2022-07-19 NOTE — Patient Instructions (Signed)
Medication Instructions:  Your physician has recommended you make the following change in your medication:   STOP Lisinopril-hydrochlorothiazide  START Lisinopril 10 mg taking 1 daily  INCREASE the Isosorbide to 60 mg taking 1 daily   *If you need a refill on your cardiac medications before your next appointment, please call your pharmacy*   Lab Work: None today  If you have labs (blood work) drawn today and your tests are completely normal, you will receive your results only by: MyChart Message (if you have MyChart) OR A paper copy in the mail If you have any lab test that is abnormal or we need to change your treatment, we will call you to review the results.   Testing/Procedures: None ordered   Follow-Up: At [facility], you and your health needs are our priority.  As part of our continuing mission to provide you with exceptional heart care, we have created designated Provider Care Teams.  These Care Teams include your primary Cardiologist (physician) and Advanced Practice Providers (APPs -  Physician Assistants and Nurse Practitioners) who all work together to provide you with the care you need, when you need it.  We recommend signing up for the patient portal called "MyChart".  Sign up information is provided on this After Visit Summary.  MyChart is used to connect with patients for Virtual Visits (Telemedicine).  Patients are able to view lab/test results, encounter notes, upcoming appointments, etc.  Non-urgent messages can be sent to your provider as well.   To learn more about what you can do with MyChart, go to [email or web address].    Your next appointment:   As scheduled  Provider:   Tereso Newcomer, PA-C         Other Instructions

## 2022-07-19 NOTE — Assessment & Plan Note (Signed)
LDL elevated at 146 despite rosuvastatin. Goal LDL <70, ideally <55. -Continue rosuvastatin 20mg  daily. -Recheck lipid panel in September 2024.

## 2022-08-03 DIAGNOSIS — Z419 Encounter for procedure for purposes other than remedying health state, unspecified: Secondary | ICD-10-CM | POA: Diagnosis not present

## 2022-08-17 ENCOUNTER — Encounter: Payer: Self-pay | Admitting: Student

## 2022-08-17 ENCOUNTER — Encounter: Payer: Medicaid Other | Admitting: Student

## 2022-09-03 DIAGNOSIS — Z419 Encounter for procedure for purposes other than remedying health state, unspecified: Secondary | ICD-10-CM | POA: Diagnosis not present

## 2022-09-11 ENCOUNTER — Ambulatory Visit: Payer: Medicaid Other | Attending: Physician Assistant

## 2022-09-11 DIAGNOSIS — I10 Essential (primary) hypertension: Secondary | ICD-10-CM

## 2022-09-11 DIAGNOSIS — E7800 Pure hypercholesterolemia, unspecified: Secondary | ICD-10-CM

## 2022-09-11 DIAGNOSIS — I214 Non-ST elevation (NSTEMI) myocardial infarction: Secondary | ICD-10-CM | POA: Diagnosis not present

## 2022-09-12 LAB — COMPREHENSIVE METABOLIC PANEL
ALT: 16 IU/L (ref 0–32)
AST: 18 IU/L (ref 0–40)
Albumin: 4 g/dL (ref 3.9–4.9)
Alkaline Phosphatase: 60 IU/L (ref 44–121)
BUN/Creatinine Ratio: 12 (ref 9–23)
BUN: 8 mg/dL (ref 6–24)
Bilirubin Total: 0.2 mg/dL (ref 0.0–1.2)
CO2: 25 mmol/L (ref 20–29)
Calcium: 8.8 mg/dL (ref 8.7–10.2)
Chloride: 104 mmol/L (ref 96–106)
Creatinine, Ser: 0.69 mg/dL (ref 0.57–1.00)
Globulin, Total: 2.8 g/dL (ref 1.5–4.5)
Glucose: 201 mg/dL — ABNORMAL HIGH (ref 70–99)
Potassium: 4.1 mmol/L (ref 3.5–5.2)
Sodium: 141 mmol/L (ref 134–144)
Total Protein: 6.8 g/dL (ref 6.0–8.5)
eGFR: 110 mL/min/{1.73_m2} (ref >59)

## 2022-09-12 LAB — LIPID PANEL
Chol/HDL Ratio: 3.3 ratio (ref 0.0–4.4)
Cholesterol, Total: 111 mg/dL (ref 100–199)
HDL: 34 mg/dL — ABNORMAL LOW (ref >39)
LDL Chol Calc (NIH): 59 mg/dL (ref 0–99)
Triglycerides: 91 mg/dL (ref 0–149)
VLDL Cholesterol Cal: 18 mg/dL (ref 5–40)

## 2022-09-20 DIAGNOSIS — Z79899 Other long term (current) drug therapy: Secondary | ICD-10-CM | POA: Diagnosis not present

## 2022-09-20 DIAGNOSIS — E669 Obesity, unspecified: Secondary | ICD-10-CM | POA: Diagnosis not present

## 2022-09-20 DIAGNOSIS — N914 Secondary oligomenorrhea: Secondary | ICD-10-CM | POA: Diagnosis not present

## 2022-09-20 DIAGNOSIS — E119 Type 2 diabetes mellitus without complications: Secondary | ICD-10-CM | POA: Diagnosis not present

## 2022-09-20 DIAGNOSIS — M5489 Other dorsalgia: Secondary | ICD-10-CM | POA: Diagnosis not present

## 2022-09-20 DIAGNOSIS — E118 Type 2 diabetes mellitus with unspecified complications: Secondary | ICD-10-CM | POA: Diagnosis not present

## 2022-09-20 DIAGNOSIS — M129 Arthropathy, unspecified: Secondary | ICD-10-CM | POA: Diagnosis not present

## 2022-09-20 DIAGNOSIS — M542 Cervicalgia: Secondary | ICD-10-CM | POA: Diagnosis not present

## 2022-09-20 DIAGNOSIS — E559 Vitamin D deficiency, unspecified: Secondary | ICD-10-CM | POA: Diagnosis not present

## 2022-09-22 DIAGNOSIS — Z79899 Other long term (current) drug therapy: Secondary | ICD-10-CM | POA: Diagnosis not present

## 2022-09-25 ENCOUNTER — Ambulatory Visit: Payer: Medicaid Other | Attending: Physician Assistant | Admitting: Physician Assistant

## 2022-09-25 DIAGNOSIS — I2081 Angina pectoris with coronary microvascular dysfunction: Secondary | ICD-10-CM

## 2022-09-25 DIAGNOSIS — M5489 Other dorsalgia: Secondary | ICD-10-CM | POA: Diagnosis not present

## 2022-09-25 DIAGNOSIS — E669 Obesity, unspecified: Secondary | ICD-10-CM | POA: Diagnosis not present

## 2022-09-25 DIAGNOSIS — I10 Essential (primary) hypertension: Secondary | ICD-10-CM

## 2022-09-25 DIAGNOSIS — E7800 Pure hypercholesterolemia, unspecified: Secondary | ICD-10-CM

## 2022-09-25 DIAGNOSIS — E118 Type 2 diabetes mellitus with unspecified complications: Secondary | ICD-10-CM | POA: Diagnosis not present

## 2022-09-25 DIAGNOSIS — N914 Secondary oligomenorrhea: Secondary | ICD-10-CM | POA: Diagnosis not present

## 2022-09-25 DIAGNOSIS — Z79899 Other long term (current) drug therapy: Secondary | ICD-10-CM | POA: Diagnosis not present

## 2022-09-25 DIAGNOSIS — M542 Cervicalgia: Secondary | ICD-10-CM | POA: Diagnosis not present

## 2022-09-25 DIAGNOSIS — E559 Vitamin D deficiency, unspecified: Secondary | ICD-10-CM | POA: Diagnosis not present

## 2022-09-26 ENCOUNTER — Other Ambulatory Visit: Payer: Self-pay

## 2022-09-26 DIAGNOSIS — Z794 Long term (current) use of insulin: Secondary | ICD-10-CM

## 2022-09-26 MED ORDER — DILTIAZEM HCL ER COATED BEADS 360 MG PO CP24: 360.0000 mg | ORAL_CAPSULE | Freq: Every day | ORAL | 3 refills | Status: AC

## 2022-09-26 MED ORDER — METFORMIN HCL 500 MG PO TABS: 500.0000 mg | ORAL_TABLET | Freq: Two times a day (BID) | ORAL | 3 refills | Status: DC

## 2022-09-26 MED ORDER — SEMAGLUTIDE(0.25 OR 0.5MG/DOS) 2 MG/3ML ~~LOC~~ SOPN
0.2500 mg | PEN_INJECTOR | SUBCUTANEOUS | 3 refills | Status: DC
Start: 2022-09-26 — End: 2023-05-31

## 2022-09-27 DIAGNOSIS — Z79899 Other long term (current) drug therapy: Secondary | ICD-10-CM | POA: Diagnosis not present

## 2022-10-03 DIAGNOSIS — Z419 Encounter for procedure for purposes other than remedying health state, unspecified: Secondary | ICD-10-CM | POA: Diagnosis not present

## 2022-10-09 DIAGNOSIS — Z79899 Other long term (current) drug therapy: Secondary | ICD-10-CM | POA: Diagnosis not present

## 2022-10-09 DIAGNOSIS — Z23 Encounter for immunization: Secondary | ICD-10-CM | POA: Diagnosis not present

## 2022-10-09 DIAGNOSIS — M5489 Other dorsalgia: Secondary | ICD-10-CM | POA: Diagnosis not present

## 2022-10-09 DIAGNOSIS — M542 Cervicalgia: Secondary | ICD-10-CM | POA: Diagnosis not present

## 2022-10-09 DIAGNOSIS — E559 Vitamin D deficiency, unspecified: Secondary | ICD-10-CM | POA: Diagnosis not present

## 2022-10-09 DIAGNOSIS — E118 Type 2 diabetes mellitus with unspecified complications: Secondary | ICD-10-CM | POA: Diagnosis not present

## 2022-10-09 DIAGNOSIS — N914 Secondary oligomenorrhea: Secondary | ICD-10-CM | POA: Diagnosis not present

## 2022-10-09 DIAGNOSIS — E669 Obesity, unspecified: Secondary | ICD-10-CM | POA: Diagnosis not present

## 2022-10-12 DIAGNOSIS — Z79899 Other long term (current) drug therapy: Secondary | ICD-10-CM | POA: Diagnosis not present

## 2022-10-16 ENCOUNTER — Encounter: Payer: 59 | Admitting: Student

## 2022-11-03 DIAGNOSIS — Z419 Encounter for procedure for purposes other than remedying health state, unspecified: Secondary | ICD-10-CM | POA: Diagnosis not present

## 2022-11-08 DIAGNOSIS — Z79899 Other long term (current) drug therapy: Secondary | ICD-10-CM | POA: Diagnosis not present

## 2022-11-08 DIAGNOSIS — E118 Type 2 diabetes mellitus with unspecified complications: Secondary | ICD-10-CM | POA: Diagnosis not present

## 2022-11-08 DIAGNOSIS — E669 Obesity, unspecified: Secondary | ICD-10-CM | POA: Diagnosis not present

## 2022-11-08 DIAGNOSIS — M5489 Other dorsalgia: Secondary | ICD-10-CM | POA: Diagnosis not present

## 2022-11-08 DIAGNOSIS — N914 Secondary oligomenorrhea: Secondary | ICD-10-CM | POA: Diagnosis not present

## 2022-11-08 DIAGNOSIS — E559 Vitamin D deficiency, unspecified: Secondary | ICD-10-CM | POA: Diagnosis not present

## 2022-11-08 DIAGNOSIS — M542 Cervicalgia: Secondary | ICD-10-CM | POA: Diagnosis not present

## 2022-11-08 DIAGNOSIS — K5909 Other constipation: Secondary | ICD-10-CM | POA: Diagnosis not present

## 2022-11-10 DIAGNOSIS — Z79899 Other long term (current) drug therapy: Secondary | ICD-10-CM | POA: Diagnosis not present

## 2022-11-22 ENCOUNTER — Other Ambulatory Visit: Payer: Self-pay

## 2022-11-22 ENCOUNTER — Emergency Department (HOSPITAL_COMMUNITY): Payer: Medicaid Other

## 2022-11-22 ENCOUNTER — Emergency Department (HOSPITAL_COMMUNITY)
Admission: EM | Admit: 2022-11-22 | Discharge: 2022-11-23 | Disposition: A | Discharge status: Home / Self Care | Payer: Medicaid Other | Attending: Emergency Medicine | Admitting: Emergency Medicine

## 2022-11-22 ENCOUNTER — Encounter (HOSPITAL_COMMUNITY): Payer: Self-pay | Admitting: Emergency Medicine

## 2022-11-22 DIAGNOSIS — R739 Hyperglycemia, unspecified: Secondary | ICD-10-CM | POA: Diagnosis not present

## 2022-11-22 DIAGNOSIS — R079 Chest pain, unspecified: Secondary | ICD-10-CM

## 2022-11-22 DIAGNOSIS — I10 Essential (primary) hypertension: Secondary | ICD-10-CM | POA: Diagnosis not present

## 2022-11-22 DIAGNOSIS — R0602 Shortness of breath: Secondary | ICD-10-CM | POA: Diagnosis not present

## 2022-11-22 DIAGNOSIS — R0789 Other chest pain: Secondary | ICD-10-CM | POA: Diagnosis not present

## 2022-11-22 DIAGNOSIS — Z7982 Long term (current) use of aspirin: Secondary | ICD-10-CM | POA: Insufficient documentation

## 2022-11-22 DIAGNOSIS — Z743 Need for continuous supervision: Secondary | ICD-10-CM | POA: Diagnosis not present

## 2022-11-22 LAB — CBC WITH DIFFERENTIAL/PLATELET
Abs Immature Granulocytes: 0.04 10*3/uL (ref 0.00–0.07)
Basophils Absolute: 0.1 10*3/uL (ref 0.0–0.1)
Basophils Relative: 1 %
Eosinophils Absolute: 0.2 10*3/uL (ref 0.0–0.5)
Eosinophils Relative: 2 %
HCT: 38.6 % (ref 36.0–46.0)
Hemoglobin: 12.3 g/dL (ref 12.0–15.0)
Immature Granulocytes: 0 %
Lymphocytes Relative: 35 %
Lymphs Abs: 4 10*3/uL (ref 0.7–4.0)
MCH: 27.8 pg (ref 26.0–34.0)
MCHC: 31.9 g/dL (ref 30.0–36.0)
MCV: 87.3 fL (ref 80.0–100.0)
Monocytes Absolute: 0.9 10*3/uL (ref 0.1–1.0)
Monocytes Relative: 8 %
Neutro Abs: 6.2 10*3/uL (ref 1.7–7.7)
Neutrophils Relative %: 54 %
Platelets: 208 10*3/uL (ref 150–400)
RBC: 4.42 MIL/uL (ref 3.87–5.11)
RDW: 12.5 % (ref 11.5–15.5)
WBC: 11.4 10*3/uL — ABNORMAL HIGH (ref 4.0–10.5)
nRBC: 0 % (ref 0.0–0.2)

## 2022-11-22 LAB — COMPREHENSIVE METABOLIC PANEL
ALT: 13 U/L (ref 0–44)
AST: 15 U/L (ref 15–41)
Albumin: 3.5 g/dL (ref 3.5–5.0)
Alkaline Phosphatase: 45 U/L (ref 38–126)
Anion gap: 5 (ref 5–15)
BUN: 9 mg/dL (ref 6–20)
CO2: 27 mmol/L (ref 22–32)
Calcium: 8.8 mg/dL — ABNORMAL LOW (ref 8.9–10.3)
Chloride: 106 mmol/L (ref 98–111)
Creatinine, Ser: 0.82 mg/dL (ref 0.44–1.00)
GFR, Estimated: >60 mL/min (ref >60)
Glucose, Bld: 245 mg/dL — ABNORMAL HIGH (ref 70–99)
Potassium: 3.9 mmol/L (ref 3.5–5.1)
Sodium: 138 mmol/L (ref 135–145)
Total Bilirubin: 0.3 mg/dL (ref <1.2)
Total Protein: 6.8 g/dL (ref 6.5–8.1)

## 2022-11-22 LAB — TROPONIN I (HIGH SENSITIVITY): Troponin I (High Sensitivity): 5 ng/L (ref <18)

## 2022-11-22 MED ORDER — MORPHINE SULFATE (PF) 4 MG/ML IV SOLN
4.0000 mg | Freq: Once | INTRAVENOUS | Status: AC
  Administered 2022-11-22: 4 mg via INTRAVENOUS
  Filled 2022-11-22: qty 1

## 2022-11-22 NOTE — ED Triage Notes (Signed)
Pt arrived via EMS from home. Pt c/o L sided chest pain that began at 1130 am today. 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 Pt states she has taken 3 nitroglycerin today and EMS gave her 1 dose en route. Pt states her pain improved some with the nitroglycerin. Pt also took 324 of aspirin prior to EMS arrival.   EMS Vitals: BP 136/80 HR 80 Spo2 100 RA CBG 299

## 2022-11-22 NOTE — ED Provider Notes (Signed)
[place] EMERGENCY DEPARTMENT AT [facility] Provider Note   CSN: [id number] Arrival date & time: 11/22/22  2027     History {Add pertinent medical, surgical, social history, OB history to HPI:1} Chief Complaint  Patient presents with   Chest Pain    Misty [name] is a 42 y.o. female.  HPI   42 year old female presents emergency department left-sided chest pain.  She states that this started around 11:30 AM.  She describes it as a heaviness that radiates to her left jaw and left arm.  Similar to how she felt when she had her MI earlier this year.  She took nitroglycerin with no significant relief so she called EMS.  She has some mild associated shortness of breath.  Pain does not radiate to her back or abdomen.  No swelling of her lower extremities.  No history of DVT/PE.  Has otherwise been compliant with her medications.  Home Medications Prior to Admission medications   Medication Sig Start Date End Date Taking? Authorizing Provider  Accu-Chek FastClix Lancets MISC Use twice per day to check your blood glucose.  E11.65 09/18/18   [name], MD  acetaminophen (TYLENOL) 500 MG tablet Take 1,500 mg by mouth daily.    [provider]  aspirin EC 81 MG tablet Take 1 tablet (81 mg total) by mouth daily. Swallow whole. 06/23/22   Tereso Newcomer T, PA-C  Blood Glucose Monitoring Suppl (ACCU-CHEK GUIDE) w/Device KIT Use to check blood glucose. 07/14/22   Marrianne Mood, MD  Continuous Glucose Sensor (FREESTYLE LIBRE 3 SENSOR) MISC Place 1 sensor on the skin every 14 days. Use to check glucose continuously 07/14/22   Marrianne Mood, MD  diltiazem (CARDIZEM CD) 360 MG 24 hr capsule Take 1 capsule (360 mg total) by mouth daily. 09/26/22   Marrianne Mood, MD  Ergocalciferol (VITAMIN D2) 10 MCG (400 UNIT) TABS Take 2 tablets by mouth daily before breakfast. 07/17/22   Marrianne Mood, MD  glucose blood (ACCU-CHEK GUIDE) test strip Use one strip, twice  daily to check your blood glucose.  E11.65 09/18/18   [name], MD  isosorbide mononitrate (IMDUR) 60 MG 24 hr tablet Take 1 tablet (60 mg total) by mouth daily. 07/19/22 10/17/22  Tereso Newcomer T, PA-C  LANTUS SOLOSTAR 100 UNIT/ML Solostar Pen Inject 10 Units into the skin in the morning and at bedtime. 10 units in the am, and 10 units at night. 03/29/20   [provider]  lisinopril (ZESTRIL) 10 MG tablet Take 1 tablet (10 mg total) by mouth daily. 07/19/22 10/17/22  Tereso Newcomer T, PA-C  metFORMIN (GLUCOPHAGE) 500 MG tablet Take 1 tablet (500 mg total) by mouth 2 (two) times daily. 09/26/22   Marrianne Mood, MD  nicotine (NICODERM CQ - DOSED IN MG/24 HOURS) 14 mg/24hr patch Place 1 patch (14 mg total) onto the skin daily. 07/14/22 07/14/23  Marrianne Mood, MD  nitroGLYCERIN (NITROSTAT) 0.4 MG SL tablet Place 1 tablet (0.4 mg total) under the tongue every 5 (five) minutes as needed for chest pain. 07/19/22 10/17/22  Tereso Newcomer T, PA-C  oxyCODONE-acetaminophen (PERCOCET) 10-325 MG tablet Take 1 tablet by mouth every 6 (six) hours as needed for pain.    [provider]  pregabalin (LYRICA) 75 MG capsule Take 75 mg by mouth 2 (two) times daily.    [provider]  rosuvastatin (CRESTOR) 20 MG tablet Take 1 tablet (20 mg total) by mouth daily. 06/23/22 09/21/22  Tereso Newcomer T, PA-C  Semaglutide,0.25 or  0.5MG /DOS, 2 MG/3ML SOPN Inject 0.25 mg into the skin once a week. 09/26/22   Marrianne Mood, MD      Allergies    Patient has no known allergies.    Review of Systems   Review of Systems  Constitutional:  Negative for fever.  Respiratory:  Positive for shortness of breath.   Cardiovascular:  Positive for chest pain.  Gastrointestinal:  Negative for abdominal pain, diarrhea and vomiting.  Genitourinary:  Negative for flank pain.  Musculoskeletal:  Negative for back pain.  Skin:  Negative for rash.  Neurological:  Negative for headaches.    Physical  Exam Updated Vital Signs BP 131/81   Pulse 75   Temp 98.1 F (36.7 C) (Oral)   Resp 18   Ht 6\' 1"  (1.854 m)   Wt 113.4 kg   SpO2 99%   BMI 32.98 kg/m  Physical Exam Vitals and nursing note reviewed.  Constitutional:      General: She is not in acute distress.    Appearance: Normal appearance.  HENT:     Head: Normocephalic.     Mouth/Throat:     Mouth: Mucous membranes are moist.  Cardiovascular:     Rate and Rhythm: Normal rate.  Pulmonary:     Effort: Pulmonary effort is normal. No respiratory distress.  Abdominal:     Palpations: Abdomen is soft.     Tenderness: There is no abdominal tenderness.  Skin:    General: Skin is warm.  Neurological:     Mental Status: She is alert and oriented to person, place, and time. Mental status is at baseline.  Psychiatric:        Mood and Affect: Mood normal.     ED Results / Procedures / Treatments   Labs (all labs ordered are listed, but only abnormal results are displayed) Labs Reviewed  CBC WITH DIFFERENTIAL/PLATELET - Abnormal; Notable for the following components:      Result Value   WBC 11.4 (*)    All other components within normal limits  COMPREHENSIVE METABOLIC PANEL - Abnormal; Notable for the following components:   Glucose, Bld 245 (*)    Calcium 8.8 (*)    All other components within normal limits  TROPONIN I (HIGH SENSITIVITY)    EKG EKG Interpretation Date/Time:  Wednesday November 22 2022 20:36:46 EST Ventricular Rate:  76 PR Interval:  137 QRS Duration:  86 QT Interval:  399 QTC Calculation: 449 R Axis:   -1  Text Interpretation: Sinus rhythm Confirmed by Coralee Pesa [phone]) on 11/22/2022 9:25:19 PM  Radiology No results found.  Procedures Procedures  {Document cardiac monitor, telemetry assessment procedure when appropriate:1}  Medications Ordered in ED Medications  morphine (PF) 4 MG/ML injection 4 mg (has no administration in time range)    ED Course/ Medical Decision Making/  A&P   {   Click here for ABCD2, HEART and other calculatorsREFRESH Note before signing :1}                              Medical Decision Making Amount and/or Complexity of Data Reviewed Labs: ordered. Radiology: ordered.  Risk Prescription drug management.   ***  {Document critical care time when appropriate:1} {Document review of labs and clinical decision tools ie heart score, Chads2Vasc2 etc:1}  {Document your independent review of radiology images, and any outside records:1} {Document your discussion with family members, caretakers, and with consultants:1} {Document social determinants of health affecting  pt's care:1} {Document your decision making why or why not admission, treatments were needed:1} Final Clinical Impression(s) / ED Diagnoses Final diagnoses:  None    Rx / DC Orders ED Discharge Orders     None

## 2022-11-23 LAB — TROPONIN I (HIGH SENSITIVITY): Troponin I (High Sensitivity): 3 ng/L (ref <18)

## 2022-11-23 MED ORDER — NITROGLYCERIN 0.6 MG/HR TD PT24
0.6000 mg | MEDICATED_PATCH | Freq: Once | TRANSDERMAL | Status: DC
  Administered 2022-11-23: 0.6 mg via TRANSDERMAL
  Filled 2022-11-23: qty 1

## 2022-11-23 MED ORDER — NITROGLYCERIN 0.6 MG/HR TD PT24: 0.6000 mg | MEDICATED_PATCH | Freq: Every day | TRANSDERMAL | Status: DC

## 2022-11-23 MED ORDER — NITROGLYCERIN 0.6 MG/HR TD PT24: 0.6000 mg | MEDICATED_PATCH | Freq: Every day | TRANSDERMAL | 12 refills | Status: DC

## 2022-11-23 NOTE — Discharge Instructions (Signed)
You have been seen and discharged from the emergency department.  Your heart workup was normal.  We consulted with cardiology and they recommend that you start using this daily nitroglycerin patch.  You may take your Imdur dose tomorrow but then you can stop this and continue with the nitroglycerin patch.  Call the cardiology office to get an office visit within the week.  Follow-up with your primary provider for further evaluation and further care. Take home medications as prescribed. If you have any worsening symptoms or further concerns for your health please return to an emergency department for further evaluation.

## 2022-11-23 NOTE — ED Notes (Signed)
Pt in NAD at d/c from ED. A&O. Ambulatory. Respirations even & unlabored. Skin warm & dry. Pt verbalized understanding of d/c teaching including follow up care, medications and reasons to return to the ED. No needs or questions expressed at d/c.

## 2022-12-03 DIAGNOSIS — Z419 Encounter for procedure for purposes other than remedying health state, unspecified: Secondary | ICD-10-CM | POA: Diagnosis not present

## 2022-12-11 DIAGNOSIS — K5909 Other constipation: Secondary | ICD-10-CM | POA: Diagnosis not present

## 2022-12-11 DIAGNOSIS — E559 Vitamin D deficiency, unspecified: Secondary | ICD-10-CM | POA: Diagnosis not present

## 2022-12-11 DIAGNOSIS — E669 Obesity, unspecified: Secondary | ICD-10-CM | POA: Diagnosis not present

## 2022-12-11 DIAGNOSIS — E118 Type 2 diabetes mellitus with unspecified complications: Secondary | ICD-10-CM | POA: Diagnosis not present

## 2022-12-11 DIAGNOSIS — Z79899 Other long term (current) drug therapy: Secondary | ICD-10-CM | POA: Diagnosis not present

## 2022-12-11 DIAGNOSIS — M542 Cervicalgia: Secondary | ICD-10-CM | POA: Diagnosis not present

## 2022-12-11 DIAGNOSIS — N914 Secondary oligomenorrhea: Secondary | ICD-10-CM | POA: Diagnosis not present

## 2022-12-11 DIAGNOSIS — M5489 Other dorsalgia: Secondary | ICD-10-CM | POA: Diagnosis not present

## 2022-12-13 DIAGNOSIS — Z79899 Other long term (current) drug therapy: Secondary | ICD-10-CM | POA: Diagnosis not present

## 2023-01-01 ENCOUNTER — Telehealth: Payer: Self-pay

## 2023-01-01 NOTE — Telephone Encounter (Signed)
Prior Authorization for patient (FreeStyle Libre 3 Sensor) came through on cover my meds was submitted per cover my meds.Gershon Cull Pierce-younger (Key: [id number]) Need Help? Call us at [phone] Outcome Additional Information Required Prior Authorization Not Required Drug FreeStyle Libre 3 Sensor ePA cloud logo Form [facility] Medicaid of [facility] Electronic Prior Authorization Request Form [phone] NCPDP)

## 2023-01-03 DIAGNOSIS — Z419 Encounter for procedure for purposes other than remedying health state, unspecified: Secondary | ICD-10-CM | POA: Diagnosis not present

## 2023-01-08 DIAGNOSIS — E669 Obesity, unspecified: Secondary | ICD-10-CM | POA: Diagnosis not present

## 2023-01-08 DIAGNOSIS — E559 Vitamin D deficiency, unspecified: Secondary | ICD-10-CM | POA: Diagnosis not present

## 2023-01-08 DIAGNOSIS — M542 Cervicalgia: Secondary | ICD-10-CM | POA: Diagnosis not present

## 2023-01-08 DIAGNOSIS — Z79899 Other long term (current) drug therapy: Secondary | ICD-10-CM | POA: Diagnosis not present

## 2023-01-08 DIAGNOSIS — N914 Secondary oligomenorrhea: Secondary | ICD-10-CM | POA: Diagnosis not present

## 2023-01-08 DIAGNOSIS — K5909 Other constipation: Secondary | ICD-10-CM | POA: Diagnosis not present

## 2023-01-08 DIAGNOSIS — E118 Type 2 diabetes mellitus with unspecified complications: Secondary | ICD-10-CM | POA: Diagnosis not present

## 2023-01-08 DIAGNOSIS — M5489 Other dorsalgia: Secondary | ICD-10-CM | POA: Diagnosis not present

## 2023-01-10 DIAGNOSIS — Z79899 Other long term (current) drug therapy: Secondary | ICD-10-CM | POA: Diagnosis not present

## 2023-01-31 ENCOUNTER — Ambulatory Visit: Payer: Medicaid Other | Admitting: Student

## 2023-01-31 ENCOUNTER — Other Ambulatory Visit: Payer: Self-pay

## 2023-01-31 ENCOUNTER — Other Ambulatory Visit (HOSPITAL_COMMUNITY)
Admission: RE | Admit: 2023-01-31 | Discharge: 2023-01-31 | Disposition: A | Discharge status: Home / Self Care | Payer: Medicaid Other | Source: Ambulatory Visit | Attending: Internal Medicine | Admitting: Internal Medicine

## 2023-01-31 VITALS — BP 123/81 | HR 96 | Temp 98.8°F | Resp 28 | Ht 73.0 in

## 2023-01-31 DIAGNOSIS — E1140 Type 2 diabetes mellitus with diabetic neuropathy, unspecified: Secondary | ICD-10-CM

## 2023-01-31 DIAGNOSIS — N760 Acute vaginitis: Secondary | ICD-10-CM | POA: Insufficient documentation

## 2023-01-31 DIAGNOSIS — Z7984 Long term (current) use of oral hypoglycemic drugs: Secondary | ICD-10-CM

## 2023-01-31 DIAGNOSIS — E0840 Diabetes mellitus due to underlying condition with diabetic neuropathy, unspecified: Secondary | ICD-10-CM | POA: Diagnosis not present

## 2023-01-31 DIAGNOSIS — R809 Proteinuria, unspecified: Secondary | ICD-10-CM | POA: Diagnosis not present

## 2023-01-31 LAB — GLUCOSE, CAPILLARY: Glucose-Capillary: 339 mg/dL — ABNORMAL HIGH (ref 70–99)

## 2023-01-31 LAB — POCT URINALYSIS DIPSTICK
Bilirubin, UA: NEGATIVE
Blood, UA: NEGATIVE
Glucose, UA: POSITIVE — AB
Ketones, UA: NEGATIVE
Leukocytes, UA: NEGATIVE
Nitrite, UA: NEGATIVE
Protein, UA: NEGATIVE
Spec Grav, UA: 1.01 (ref 1.010–1.025)
Urobilinogen, UA: 0.2 U/dL
pH, UA: 5.5 (ref 5.0–8.0)

## 2023-01-31 LAB — POCT GLYCOSYLATED HEMOGLOBIN (HGB A1C): Hemoglobin A1C: 10.3 % — AB (ref 4.0–5.6)

## 2023-01-31 LAB — POCT URINE PREGNANCY: Preg Test, Ur: NEGATIVE

## 2023-01-31 MED ORDER — METFORMIN HCL 500 MG PO TABS: 500.0000 mg | ORAL_TABLET | Freq: Two times a day (BID) | ORAL | 3 refills | Status: DC

## 2023-01-31 NOTE — Patient Instructions (Addendum)
Thank you, Misty [name] for allowing [place] to provide your care today. Today we discussed your concerns regarding a possible yeast infection.   I have ordered the following labs for you:  Lab Orders         POC Hbg A1C      Tests ordered today:  None  Referrals ordered today:   Referral Orders  No referral(s) requested today     I have ordered the following medication/changed the following medications:   Stop the following medications: Medications Discontinued During This Encounter  Medication Reason   metFORMIN (GLUCOPHAGE) 500 MG tablet Reorder     Start the following medications: Meds ordered this encounter  Medications   metFORMIN (GLUCOPHAGE) 500 MG tablet    Sig: Take 1 tablet (500 mg total) by mouth 2 (two) times daily.    Dispense:  180 tablet    Refill:  3     Follow up: 7 days if symptoms not better or worsen, 1 month for T2DM follow up    Remember:   - I will call with the results of your cervical swab  - When I call you you can tell me what medication refills you need  - For vaginal dryness/itch you can use:     - Please avoid any douching or fragrant soaps. Try to wash with water alone and pat dry, if you would like to use some soap try a very sensitive, colorless, fragrance free soap like Dove.     - Please come back in 1 month for management of your diabetes.   - Please call us or come back in 7 days if your symptoms worsen or do not improve, as in you notice pain, burning with urination, fever, nausea, vomiting, bleeding, or discharge.   Should you have any questions or concerns please call the internal medicine clinic at [phone].     Kennita Pavlovich Colbert Coyer, MD PGY-1 Internal Medicine Teaching Progam [facility]

## 2023-02-01 LAB — CERVICOVAGINAL ANCILLARY ONLY
Bacterial Vaginitis (gardnerella): NEGATIVE
Candida Glabrata: NEGATIVE
Candida Vaginitis: POSITIVE — AB
Chlamydia: NEGATIVE
Comment: NEGATIVE
Comment: NEGATIVE
Comment: NEGATIVE
Comment: NEGATIVE
Comment: NEGATIVE
Comment: NORMAL
Neisseria Gonorrhea: NEGATIVE
Trichomonas: POSITIVE — AB

## 2023-02-01 LAB — MICROALBUMIN / CREATININE URINE RATIO
Creatinine, Urine: 16.4 mg/dL
Microalb/Creat Ratio: <18 mg/g{creat} (ref 0–29)
Microalbumin, Urine: <3 ug/mL

## 2023-02-02 ENCOUNTER — Other Ambulatory Visit: Payer: Self-pay | Admitting: Student

## 2023-02-02 DIAGNOSIS — N760 Acute vaginitis: Secondary | ICD-10-CM | POA: Insufficient documentation

## 2023-02-02 MED ORDER — METRONIDAZOLE 500 MG PO TABS: 500.0000 mg | ORAL_TABLET | Freq: Two times a day (BID) | ORAL | 0 refills | Status: AC

## 2023-02-02 MED ORDER — FLUCONAZOLE 150 MG PO TABS: 150.0000 mg | ORAL_TABLET | Freq: Every day | ORAL | 0 refills | Status: AC

## 2023-02-02 NOTE — Progress Notes (Signed)
Acute Office Visit  Subjective:     Patient ID: Misty [name], female    DOB: 05/27/1980, 43 y.o.   MRN: [id number]  Chief Complaint  Patient presents with   Vaginitis    HPI Patient is in today for vaginal itch and irration. Has a history of yeast infections, PCOS, hidradenitis suppurativa, HTN, and T2DM. Please see below (assessment and plan) for more details.   Review of Systems  Constitutional:  Negative for fever.  Gastrointestinal:  Negative for abdominal pain, nausea and vomiting.  Genitourinary:  Negative for dysuria.  Skin:  Positive for itching.      Objective:    BP 123/81 (BP Location: Left Arm, Patient Position: Sitting, Cuff Size: Normal)   Pulse 96   Temp 98.8 F (37.1 C) (Oral)   Resp (!) 28   Ht 6\' 1"  (1.854 m)   LMP 12/25/2022   SpO2 100% Comment: room air  BMI 32.98 kg/m  BP Readings from Last 3 Encounters:  01/31/23 123/81  11/23/22 111/70  07/19/22 110/62   Wt Readings from Last 3 Encounters:  11/22/22 250 lb (113.4 kg)  07/19/22 240 lb 9.6 oz (109.1 kg)  07/14/22 245 lb 1.6 oz (111.2 kg)      Physical Exam Constitutional:      Appearance: Normal appearance.  HENT:     Head: Atraumatic.  Pulmonary:     Effort: Pulmonary effort is normal.     Breath sounds: Normal breath sounds.  Genitourinary:    Comments: Labia minora and majora tender to touch, vaginal introitus erythematous, no obvious discharge,  bleeding, or satellite lesions observed.  Skin:    General: Skin is warm and dry.  Neurological:     General: No focal deficit present.     Mental Status: She is alert.  Psychiatric:        Mood and Affect: Mood normal.        Behavior: Behavior normal.    Results for orders placed or performed in visit on 01/31/23  Glucose, capillary  Result Value Ref Range   Glucose-Capillary 339 (H) 70 - 99 mg/dL  Microalbumin / Creatinine Urine Ratio  Result Value Ref Range   Creatinine, Urine 16.4 Not Estab. mg/dL    Microalbumin, Urine <3.0 Not Estab. ug/mL   Microalb/Creat Ratio <18 0 - 29 mg/g creat  POC Hbg A1C  Result Value Ref Range   Hemoglobin A1C 10.3 (A) 4.0 - 5.6 %   HbA1c POC (<> result, manual entry)     HbA1c, POC (prediabetic range)     HbA1c, POC (controlled diabetic range)    POCT Urinalysis Dipstick (81002)  Result Value Ref Range   Color, UA Yellow    Clarity, UA Clear    Glucose, UA Positive (A) Negative   Bilirubin, UA Negative    Ketones, UA Negative    Spec Grav, UA 1.010 1.010 - 1.025   Blood, UA Negative    pH, UA 5.5 5.0 - 8.0   Protein, UA Negative Negative   Urobilinogen, UA 0.2 0.2 or 1.0 E.U./dL   Nitrite, UA Negative    Leukocytes, UA Negative Negative  POCT Urine Pregnancy  Result Value Ref Range   Preg Test, Ur Negative Negative  Cervicovaginal ancillary only  Result Value Ref Range   Neisseria Gonorrhea Negative    Chlamydia Negative    Trichomonas Positive (A)    Bacterial Vaginitis (gardnerella) Negative    Candida Vaginitis Positive (A)    Candida Glabrata Negative  Comment      Normal Reference Range Bacterial Vaginosis - Negative   Comment Normal Reference Range Candida Species - Negative    Comment Normal Reference Range Candida Galbrata - Negative    Comment Normal Reference Range Trichomonas - Negative    Comment Normal Reference Ranger Chlamydia - Negative    Comment      Normal Reference Range Neisseria Gonorrhea - Negative      Assessment & Plan:   Problem List Items Addressed This Visit     Diabetes mellitus with neuropathy (HCC) (Chronic)   A1c 10.3% today, 11.4% 6 months ago. Microalbumin/creatinine ratio <18 today. Patient to follow up in 1 month for diabetes management.       Relevant Medications   metFORMIN (GLUCOPHAGE) 500 MG tablet   Other Relevant Orders   Microalbumin / Creatinine Urine Ratio (Completed)   Vaginitis   Patient states vaginal itching and irritation began 2 days ago after using a new shower gel. Has  history of yeast infections, last one 8-9 months ago, and feels like symptoms are similar. Has not observed discharge but does endorse vaginal swelling and tenderness. Getting regular menstrual periods, sexually active with one partner. LMP December 23rd, still waiting to get menstrual period this month. Denies fever, nausea, vomiting, abdominal pain or dysuria. GU exam notable for tenderness and erythematous vaginal introitus. No observable discharge, bleeding, or satellite lesions. UA unremarkable, pregnancy test negative. Cervicovaginal swab positive for trichomonas and candida vaginitis. Called patient with lab results and sent in prescriptions. Discussed importance of treatment for her partner and patient acknowledged understanding.  Plan - Fluconazole 150 mg x 2 days for candida vaginitis - Metronidazole 500 mg x 7 days for trichomonas, partner follows with [facility] and will call for treatment, discussed avoidance of alcohol while on medication and barrier protection - Patient to call clinic if symptoms don't improve - Recommended OTC Replense vaginal moisturizer and sensitive soap, patient to avoid gels with fragrances and/or douching - Return to clinic in 1 month for diabetes management, as her A1c puts her at greater risk of yeast infections      Other Visit Diagnoses       Vaginal infection    -  Primary   Relevant Orders   Cervicovaginal ancillary only (Completed)   POC Hbg A1C (Completed)   POCT Urinalysis Dipstick (81002) (Completed)   POCT Urine Pregnancy (Completed)       Meds ordered this encounter  Medications   metFORMIN (GLUCOPHAGE) 500 MG tablet    Sig: Take 1 tablet (500 mg total) by mouth 2 (two) times daily.    Dispense:  180 tablet    Refill:  3   Patient discussed with Dr. Mayford Knife.  Return in about 4 weeks (around 02/28/2023) for Diabetes management.  Mylena Sedberry Colbert Coyer, MD

## 2023-02-02 NOTE — Assessment & Plan Note (Signed)
A1c 10.3% today, 11.4% 6 months ago. Microalbumin/creatinine ratio <18 today. Patient to follow up in 1 month for diabetes management.

## 2023-02-02 NOTE — Assessment & Plan Note (Signed)
Patient states vaginal itching and irritation began 2 days ago after using a new shower gel. Has history of yeast infections, last one 8-9 months ago, and feels like symptoms are similar. Has not observed discharge but does endorse vaginal swelling and tenderness. Getting regular menstrual periods, sexually active with one partner. LMP December 23rd, still waiting to get menstrual period this month. Denies fever, nausea, vomiting, abdominal pain or dysuria. GU exam notable for tenderness and erythematous vaginal introitus. No observable discharge, bleeding, or satellite lesions. UA unremarkable, pregnancy test negative. Cervicovaginal swab positive for trichomonas and candida vaginitis. Called patient with lab results and sent in prescriptions. Discussed importance of treatment for her partner and patient acknowledged understanding.  Plan - Fluconazole 150 mg x 2 days for candida vaginitis - Metronidazole 500 mg x 7 days for trichomonas, partner follows with [facility] and will call for treatment, discussed avoidance of alcohol while on medication and barrier protection - Patient to call clinic if symptoms don't improve - Recommended OTC Replense vaginal moisturizer and sensitive soap, patient to avoid gels with fragrances and/or douching - Return to clinic in 1 month for diabetes management, as her A1c puts her at greater risk of yeast infections

## 2023-02-03 DIAGNOSIS — Z419 Encounter for procedure for purposes other than remedying health state, unspecified: Secondary | ICD-10-CM | POA: Diagnosis not present

## 2023-02-05 DIAGNOSIS — M542 Cervicalgia: Secondary | ICD-10-CM | POA: Diagnosis not present

## 2023-02-05 DIAGNOSIS — Z79899 Other long term (current) drug therapy: Secondary | ICD-10-CM | POA: Diagnosis not present

## 2023-02-05 DIAGNOSIS — E6609 Other obesity due to excess calories: Secondary | ICD-10-CM | POA: Diagnosis not present

## 2023-02-05 DIAGNOSIS — N914 Secondary oligomenorrhea: Secondary | ICD-10-CM | POA: Diagnosis not present

## 2023-02-05 DIAGNOSIS — M5489 Other dorsalgia: Secondary | ICD-10-CM | POA: Diagnosis not present

## 2023-02-06 NOTE — Progress Notes (Signed)
 Internal Medicine Clinic Attending  Case discussed with the resident at the time of the visit.  We reviewed the resident's history and exam and pertinent patient test results.  I agree with the assessment, diagnosis, and plan of care documented in the resident's note.

## 2023-02-12 DIAGNOSIS — Z79899 Other long term (current) drug therapy: Secondary | ICD-10-CM | POA: Diagnosis not present

## 2023-03-03 DIAGNOSIS — Z419 Encounter for procedure for purposes other than remedying health state, unspecified: Secondary | ICD-10-CM | POA: Diagnosis not present

## 2023-03-05 DIAGNOSIS — E118 Type 2 diabetes mellitus with unspecified complications: Secondary | ICD-10-CM | POA: Diagnosis not present

## 2023-03-05 DIAGNOSIS — K5909 Other constipation: Secondary | ICD-10-CM | POA: Diagnosis not present

## 2023-03-05 DIAGNOSIS — N914 Secondary oligomenorrhea: Secondary | ICD-10-CM | POA: Diagnosis not present

## 2023-03-05 DIAGNOSIS — M5489 Other dorsalgia: Secondary | ICD-10-CM | POA: Diagnosis not present

## 2023-03-05 DIAGNOSIS — E669 Obesity, unspecified: Secondary | ICD-10-CM | POA: Diagnosis not present

## 2023-03-05 DIAGNOSIS — G8929 Other chronic pain: Secondary | ICD-10-CM | POA: Diagnosis not present

## 2023-03-05 DIAGNOSIS — E559 Vitamin D deficiency, unspecified: Secondary | ICD-10-CM | POA: Diagnosis not present

## 2023-03-05 DIAGNOSIS — M542 Cervicalgia: Secondary | ICD-10-CM | POA: Diagnosis not present

## 2023-03-05 DIAGNOSIS — Z79899 Other long term (current) drug therapy: Secondary | ICD-10-CM | POA: Diagnosis not present

## 2023-03-07 DIAGNOSIS — Z79899 Other long term (current) drug therapy: Secondary | ICD-10-CM | POA: Diagnosis not present

## 2023-03-21 ENCOUNTER — Ambulatory Visit: Payer: Self-pay

## 2023-03-21 NOTE — Telephone Encounter (Signed)
 Copied from CRM [id number]. Topic: Clinical - Red Word Triage >> Mar 21, 2023  3:45 PM Alvino Blood C wrote: Red Word that prompted transfer to Nurse Triage: Patient says she discovered a boil under her arm (right armpit) she would rate the pain a 8   Chief Complaint: Boil Symptoms: Pain, Swelling, Fever, Chills  Frequency: Acute, 4 Days Ago Pertinent Negatives: Patient denies dyspnea, chest pain, numbness, discoloration, or weakness.  Disposition: [] ED /[x] Urgent Care (no appt availability in office) / [] Appointment(In office/virtual)/ []  [place] [facility]/ [] Home Care/ [] Refused Recommended Disposition /[] [place] [facility]/ []  Follow-up with PCP Additional Notes: PY is being triaged for an abscess under the right arm. The patient has been utilizing warm compresses. The patient reports feeling as if she is warmer than usual and having chills that were sudden in onset. Based on symptom presentation, onset, duration, and characteristics recommended Urgent Care for prompt evaluation and treatment. Patient agreed to disposition. Verbalized understanding.   Reason for Disposition  Fever > 100.4 F (38.0 C)  SEVERE pain (e.g., excruciating)  Answer Assessment - Initial Assessment Questions 1. APPEARANCE of BOIL: "What does the boil look like?"      Normal  2. LOCATION: "Where is the boil located?"      Under the right arm  3. NUMBER: "How many boils are there?"      One  4. SIZE: "How big is the boil?" (e.g., inches, cm; compare to size of a coin or other object)     Quarter Size  5. ONSET: "When did the boil start?"     4 Days   6. PAIN: "Is there any pain?" If Yes, ask: "How bad is the pain?"   (Scale 1-10; or mild, moderate, severe)     8  7. FEVER: "Do you have a fever?" If Yes, ask: "What is it, how was it measured, and when did it start?"      Yes  8. SOURCE: "Have you been around anyone with boils or other Staph infections?" "Have you ever had boils before?"     No  9.  OTHER SYMPTOMS: "Do you have any other symptoms?" (e.g., shaking chills, weakness, rash elsewhere on body)     Sweating, Chils  10. PREGNANCY: "Is there any chance you are pregnant?" "When was your last menstrual period?"       No and LMP 02-25-2023  Protocols used: Boil (Skin Abscess)-A-AH

## 2023-03-22 NOTE — Telephone Encounter (Signed)
 Agree, thank you

## 2023-03-23 DIAGNOSIS — K760 Fatty (change of) liver, not elsewhere classified: Secondary | ICD-10-CM | POA: Diagnosis not present

## 2023-03-26 ENCOUNTER — Ambulatory Visit: Payer: Self-pay

## 2023-03-26 NOTE — Telephone Encounter (Signed)
  Chief Complaint: Burning and pressure with urination, vaginal itching Symptoms: sores to vaginal area, burning and pressure with urination but not all the time, vaginal itching Frequency: started a month ago when she was treated for UTI Pertinent Negatives: Patient denies fever, CP, SOB Disposition: [] ED /[] Urgent Care (no appt availability in office) / [] Appointment(In office/virtual)/ []  [place] [facility]/ [] Home Care/ [] Refused Recommended Disposition /[] [place] [facility]/ [x]  Follow-up with PCP Additional Notes: patient calling with continued concerns for burning and pressure with urination, vaginal itching and sores to vaginal area. Patient states she was treated for UTI a month ago. Patient states she has been following instructions from MD along with no sexual intercourse. Patient states burning and pressure with urination isn't constant nor does she have any urinary frequency. Per protocol, patient recommended to be seen in 24 hours. Appointment available on 03/28/2023. Patient will need a phone call from PCP staff to make an appointment due to E2C2 unable to schedule out of protocol. Patient verbalized understanding and all questions answered.    Copied from CRM [id number]. Topic: Clinical - Red Word Triage >> Mar 26, 2023 12:25 PM Alvino Blood C wrote: Red Word that prompted transfer to Nurse Triage: Patient believes she may have another UTI. She has been having some burning, itching and pressure with some sores in her vaginal area. Reason for Disposition  All other patients with painful urination  (Exception: [1] EITHER frequency or urgency AND [2] has on-call doctor.)  Answer Assessment - Initial Assessment Questions 1. SEVERITY: "How bad is the pain?"  (e.g., Scale 1-10; mild, moderate, or severe)   - MILD (1-3): complains slightly about urination hurting   - MODERATE (4-7): interferes with normal activities     - SEVERE (8-10): excruciating, unwilling or unable to urinate because  of the pain      Moderate 2. FREQUENCY: "How many times have you had painful urination today?"      No 3. PATTERN: "Is pain present every time you urinate or just sometimes?"      Just sometimes 4. ONSET: "When did the painful urination start?"      A month ago 5. FEVER: "Do you have a fever?" If Yes, ask: "What is your temperature, how was it measured, and when did it start?"     No 6. PAST UTI: "Have you had a urine infection before?" If Yes, ask: "When was the last time?" and "What happened that time?"      Yes- a month ago 7. CAUSE: "What do you think is causing the painful urination?"  (e.g., UTI, scratch, Herpes sore)     unsure 8. OTHER SYMPTOMS: "Do you have any other symptoms?" (e.g., blood in urine, flank pain, genital sores, urgency, vaginal discharge)     Sore sores in vaginal area  Protocols used: Urination Pain - Female-A-AH

## 2023-03-26 NOTE — Telephone Encounter (Signed)
 Call to patient .  Transferred to front office to schedule appointment.

## 2023-03-29 ENCOUNTER — Encounter: Payer: Self-pay | Admitting: Student

## 2023-03-29 ENCOUNTER — Other Ambulatory Visit (HOSPITAL_COMMUNITY)
Admission: RE | Admit: 2023-03-29 | Discharge: 2023-03-29 | Disposition: A | Discharge status: Home / Self Care | Source: Ambulatory Visit | Attending: Internal Medicine | Admitting: Internal Medicine

## 2023-03-29 ENCOUNTER — Ambulatory Visit (INDEPENDENT_AMBULATORY_CARE_PROVIDER_SITE_OTHER): Admitting: Student

## 2023-03-29 VITALS — BP 152/77 | HR 100 | Temp 98.4°F | Ht 73.0 in | Wt 252.0 lb

## 2023-03-29 DIAGNOSIS — N761 Subacute and chronic vaginitis: Secondary | ICD-10-CM | POA: Insufficient documentation

## 2023-03-29 DIAGNOSIS — R300 Dysuria: Secondary | ICD-10-CM

## 2023-03-29 DIAGNOSIS — N760 Acute vaginitis: Secondary | ICD-10-CM | POA: Diagnosis not present

## 2023-03-29 LAB — POCT URINALYSIS DIPSTICK
Bilirubin, UA: NEGATIVE
Glucose, UA: POSITIVE — AB
Ketones, UA: NEGATIVE
Leukocytes, UA: NEGATIVE
Nitrite, UA: NEGATIVE
Protein, UA: NEGATIVE
Spec Grav, UA: 1.015 (ref 1.010–1.025)
Urobilinogen, UA: 2 U/dL — AB
pH, UA: 6 (ref 5.0–8.0)

## 2023-03-29 LAB — CERVICOVAGINAL ANCILLARY ONLY
Bacterial Vaginitis (gardnerella): NEGATIVE
Candida Glabrata: POSITIVE — AB
Candida Vaginitis: POSITIVE — AB
Chlamydia: NEGATIVE
Comment: NEGATIVE
Comment: NEGATIVE
Comment: NEGATIVE
Comment: NEGATIVE
Comment: NEGATIVE
Comment: NORMAL
Neisseria Gonorrhea: NEGATIVE
Trichomonas: NEGATIVE

## 2023-03-29 NOTE — Progress Notes (Unsigned)
   CC: UTI vs Vaginitis  HPI:  Misty [name] is a 43 y.o. female with a PMH stated below who presents today for acute visit.  She was evaluated 1 month for vaginitis and treated for trichomonas and candida vaginitis with metronidazole and fluconazole. Unfortunately she reports no resolution (but also no worsening) of symptoms. Today she reports continued discomfort, primarily vulvovaginal itching but also occasional dysuria.  She denies discharge, urinary frequency, pain, fevers.  She remains with the same female partner she has had since last year, although has not had intercourse since the last office visit. It looks like plans were made to treat her partner but I do not believe that ultimately happened.  LMP 1 month ago.  Please see problem based assessment and plan for additional details.  Past Medical History:  Diagnosis Date   Chest pain    multiple ED evaluations, most likely musculoskeletal (cervical radiculopathy), needs MRI spine   Diabetes mellitus without complication (HCC)    Diverticulitis 2019   GERD (gastroesophageal reflux disease)    Gestational diabetes    Hidradenitis suppurativa 01/18/2018   History of diverticulitis 10/03/2017   Hypertension    NSTEMI (non-ST elevated myocardial infarction) (HCC) 06/18/2022   PCOD (polycystic ovarian disease)    Followed by [facility], has been on metformin   PCOS (polycystic ovarian syndrome)    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    Review of Systems: ROS negative except for what is noted on the assessment and plan.  Vitals:   03/29/23 1402  BP: (!) 152/77  Pulse: 100  Temp: 98.4 F (36.9 C)  TempSrc: Oral  SpO2: 100%  Weight: 252 lb (114.3 kg)  Height: 6\' 1"  (1.854 m)    Physical Exam: Constitutional: well-appearing woman in no acute distress Cardiovascular: regular rate and rhythm, no m/r/g Pulmonary/Chest: normal work of breathing on room air, lungs clear to auscultation bilaterally Abdominal: soft,  non-tender, non-distended MSK: normal bulk and tone Neurological: alert & oriented x 3, no focal deficit GU: Exam WNL. No discharge, external excoriations, or skin changes, no apparent atrophy or dryness. Skin: warm and dry Psych: normal mood and behavior  Assessment & Plan:   Patient discussed with Dr. Lafonda Mosses  Vaginitis As this was evaluated 1 month ago, I wonder if she failed treatment or has an alternative cause beyond the infectious, such as lichen sclerosis or vaginal atrophy. - UA unremarkable, no UTI - GU exam unrevealing - Will repeat cervicovaginal testing today - Will repeat treatment with fluconazole and metronidazole will for extended course if necessary.  Will also attempt to treat the partner if necessary. - Should these test be negative, I would consider vulvovaginal atrophy versus lichen sclerosis and first treat with premarin cream  RTC 1 month for general checkup, diabetes management.  Katheran James, D.O. [facility] Internal Medicine, PGY-1 Phone: [phone] Date 03/30/2023 Time 8:37 AM

## 2023-03-29 NOTE — Assessment & Plan Note (Addendum)
 This was evaluated 1 month ago and she returns saying that her symptoms have much improved.  At previous visit, she was found to be positive for candidal vaginitis and trichomonas.  Treatment was initiated with fluconazole and metronidazole and she took these medicines.  Unfortunately her symptoms did not improve.  They have not worsened.  Today she continues to have primarily vulvovaginal itching.  She denies discharge.  She has occasional dysuria but no increased frequency.  She has no systemic symptoms.  She remains with the same female partner she has had since last year, although has not had intercourse since the last visit and I do not believe he was treated, although efforts were made for him to do this.  LMP 1 month ago. - Will repeat cervicovaginal testing today. - UA unremarkable - GU exam unrevealing - Will repeat treatment with fluconazole and metronidazole will for extended course if necessary.  Will also attempt to treat the partner if necessary. - Should these test be negative, I would consider vulvovaginal atrophy versus lichen sclerosis and first treat with premarin cream

## 2023-03-30 MED ORDER — FLUCONAZOLE 150 MG PO TABS: 150.0000 mg | ORAL_TABLET | Freq: Every day | ORAL | 0 refills | Status: AC

## 2023-03-30 NOTE — Addendum Note (Signed)
 Addended by: Katheran James on: 03/30/2023 02:32 PM   Modules accepted: Orders

## 2023-04-02 DIAGNOSIS — M549 Dorsalgia, unspecified: Secondary | ICD-10-CM | POA: Diagnosis not present

## 2023-04-02 DIAGNOSIS — Z79899 Other long term (current) drug therapy: Secondary | ICD-10-CM | POA: Diagnosis not present

## 2023-04-02 DIAGNOSIS — E118 Type 2 diabetes mellitus with unspecified complications: Secondary | ICD-10-CM | POA: Diagnosis not present

## 2023-04-02 DIAGNOSIS — G8929 Other chronic pain: Secondary | ICD-10-CM | POA: Diagnosis not present

## 2023-04-02 DIAGNOSIS — M129 Arthropathy, unspecified: Secondary | ICD-10-CM | POA: Diagnosis not present

## 2023-04-02 DIAGNOSIS — K5909 Other constipation: Secondary | ICD-10-CM | POA: Diagnosis not present

## 2023-04-02 DIAGNOSIS — E559 Vitamin D deficiency, unspecified: Secondary | ICD-10-CM | POA: Diagnosis not present

## 2023-04-02 DIAGNOSIS — E669 Obesity, unspecified: Secondary | ICD-10-CM | POA: Diagnosis not present

## 2023-04-02 DIAGNOSIS — M5030 Other cervical disc degeneration, unspecified cervical region: Secondary | ICD-10-CM | POA: Diagnosis not present

## 2023-04-02 DIAGNOSIS — N914 Secondary oligomenorrhea: Secondary | ICD-10-CM | POA: Diagnosis not present

## 2023-04-03 NOTE — Progress Notes (Signed)
 Internal Medicine Clinic Attending  Case discussed with the resident at the time of the visit.  We reviewed the resident's history and exam and pertinent patient test results.  I agree with the assessment, diagnosis, and plan of care documented in the resident's note.

## 2023-04-04 DIAGNOSIS — Z79899 Other long term (current) drug therapy: Secondary | ICD-10-CM | POA: Diagnosis not present

## 2023-04-14 DIAGNOSIS — Z419 Encounter for procedure for purposes other than remedying health state, unspecified: Secondary | ICD-10-CM | POA: Diagnosis not present

## 2023-05-01 DIAGNOSIS — M549 Dorsalgia, unspecified: Secondary | ICD-10-CM | POA: Diagnosis not present

## 2023-05-01 DIAGNOSIS — K5909 Other constipation: Secondary | ICD-10-CM | POA: Diagnosis not present

## 2023-05-01 DIAGNOSIS — Z79899 Other long term (current) drug therapy: Secondary | ICD-10-CM | POA: Diagnosis not present

## 2023-05-01 DIAGNOSIS — E559 Vitamin D deficiency, unspecified: Secondary | ICD-10-CM | POA: Diagnosis not present

## 2023-05-01 DIAGNOSIS — E118 Type 2 diabetes mellitus with unspecified complications: Secondary | ICD-10-CM | POA: Diagnosis not present

## 2023-05-01 DIAGNOSIS — M5030 Other cervical disc degeneration, unspecified cervical region: Secondary | ICD-10-CM | POA: Diagnosis not present

## 2023-05-01 DIAGNOSIS — K219 Gastro-esophageal reflux disease without esophagitis: Secondary | ICD-10-CM | POA: Diagnosis not present

## 2023-05-01 DIAGNOSIS — N914 Secondary oligomenorrhea: Secondary | ICD-10-CM | POA: Diagnosis not present

## 2023-05-01 DIAGNOSIS — G8929 Other chronic pain: Secondary | ICD-10-CM | POA: Diagnosis not present

## 2023-05-01 DIAGNOSIS — E669 Obesity, unspecified: Secondary | ICD-10-CM | POA: Diagnosis not present

## 2023-05-03 DIAGNOSIS — Z79899 Other long term (current) drug therapy: Secondary | ICD-10-CM | POA: Diagnosis not present

## 2023-05-03 DIAGNOSIS — F1020 Alcohol dependence, uncomplicated: Secondary | ICD-10-CM | POA: Diagnosis not present

## 2023-05-04 DIAGNOSIS — F1020 Alcohol dependence, uncomplicated: Secondary | ICD-10-CM | POA: Diagnosis not present

## 2023-05-07 DIAGNOSIS — F1020 Alcohol dependence, uncomplicated: Secondary | ICD-10-CM | POA: Diagnosis not present

## 2023-05-08 DIAGNOSIS — F1020 Alcohol dependence, uncomplicated: Secondary | ICD-10-CM | POA: Diagnosis not present

## 2023-05-09 DIAGNOSIS — F1020 Alcohol dependence, uncomplicated: Secondary | ICD-10-CM | POA: Diagnosis not present

## 2023-05-10 DIAGNOSIS — F1020 Alcohol dependence, uncomplicated: Secondary | ICD-10-CM | POA: Diagnosis not present

## 2023-05-11 DIAGNOSIS — F1020 Alcohol dependence, uncomplicated: Secondary | ICD-10-CM | POA: Diagnosis not present

## 2023-05-12 ENCOUNTER — Telehealth: Payer: MEDICAID | Admitting: Emergency Medicine

## 2023-05-12 DIAGNOSIS — B3731 Acute candidiasis of vulva and vagina: Secondary | ICD-10-CM | POA: Diagnosis not present

## 2023-05-12 MED ORDER — FLUCONAZOLE 150 MG PO TABS: 150.0000 mg | ORAL_TABLET | ORAL | 0 refills | Status: DC

## 2023-05-12 NOTE — Patient Instructions (Signed)
 Misty [name], thank you for joining Blinda Burger, NP for today's virtual visit.  While this provider is not your primary care provider (PCP), if your PCP is located in our provider database this encounter information will be shared with them immediately following your visit.   A [facility] MyChart account gives you access to today's visit and all your visits, tests, and labs performed at [facility] " click here if you don't have a [facility] MyChart account or go to [email or web address]  Consent: (Patient) Misty [name] provided verbal consent for this virtual visit at the beginning of the encounter.  Current Medications:  Current Outpatient Medications:    fluconazole  (DIFLUCAN ) 150 MG tablet, Take 1 tablet (150 mg total) by mouth every 3 (three) days., Disp: 3 tablet, Rfl: 0   Accu-Chek FastClix Lancets MISC, Use twice per day to check your blood glucose.  E11.65, Disp: 100 each, Rfl: 3   acetaminophen  (TYLENOL ) 500 MG tablet, Take 1,500 mg by mouth daily., Disp: , Rfl:    aspirin  EC 81 MG tablet, Take 1 tablet (81 mg total) by mouth daily. Swallow whole., Disp: 90 tablet, Rfl: 3   Blood Glucose Monitoring Suppl (ACCU-CHEK GUIDE) w/Device KIT, Use to check blood glucose., Disp: 1 kit, Rfl: 0   Continuous Glucose Sensor (FREESTYLE LIBRE 3 SENSOR) MISC, Place 1 sensor on the skin every 14 days. Use to check glucose continuously, Disp: 2 each, Rfl: 3   diltiazem  (CARDIZEM  CD) 360 MG 24 hr capsule, Take 1 capsule (360 mg total) by mouth daily., Disp: 90 capsule, Rfl: 3   Ergocalciferol  (VITAMIN D2) 10 MCG (400 UNIT) TABS, Take 2 tablets by mouth daily before breakfast., Disp: 90 tablet, Rfl: 3   glucose blood (ACCU-CHEK GUIDE) test strip, Use one strip, twice daily to check your blood glucose.  E11.65, Disp: 100 each, Rfl: 12   isosorbide  mononitrate (IMDUR ) 60 MG 24 hr tablet, Take 1 tablet (60 mg total) by mouth daily., Disp: 90 tablet, Rfl: 3    LANTUS  SOLOSTAR 100 UNIT/ML Solostar Pen, Inject 10 Units into the skin in the morning and at bedtime. 10 units in the am, and 10 units at night., Disp: , Rfl:    lisinopril  (ZESTRIL ) 10 MG tablet, Take 1 tablet (10 mg total) by mouth daily., Disp: 90 tablet, Rfl: 3   metFORMIN  (GLUCOPHAGE ) 500 MG tablet, Take 1 tablet (500 mg total) by mouth 2 (two) times daily., Disp: 180 tablet, Rfl: 3   nicotine  (NICODERM CQ  - DOSED IN MG/24 HOURS) 14 mg/24hr patch, Place 1 patch (14 mg total) onto the skin daily., Disp: 30 patch, Rfl: 0   nitroGLYCERIN  (NITRODUR - DOSED IN MG/24 HR) 0.6 mg/hr patch, Place 1 patch (0.6 mg total) onto the skin daily., Disp: 30 patch, Rfl: 12   nitroGLYCERIN  (NITROSTAT ) 0.4 MG SL tablet, Place 1 tablet (0.4 mg total) under the tongue every 5 (five) minutes as needed for chest pain., Disp: 25 tablet, Rfl: 3   oxyCODONE -acetaminophen  (PERCOCET) 10-325 MG tablet, Take 1 tablet by mouth every 6 (six) hours as needed for pain., Disp: , Rfl:    pregabalin (LYRICA) 75 MG capsule, Take 75 mg by mouth 2 (two) times daily., Disp: , Rfl:    rosuvastatin  (CRESTOR ) 20 MG tablet, Take 1 tablet (20 mg total) by mouth daily., Disp: 90 tablet, Rfl: 3   Semaglutide ,0.25 or 0.5MG /DOS, 2 MG/3ML SOPN, Inject 0.25 mg into the skin once a week., Disp: 3 mL, Rfl: 3  Medications ordered in this encounter:  Meds ordered this encounter  Medications   fluconazole  (DIFLUCAN ) 150 MG tablet    Sig: Take 1 tablet (150 mg total) by mouth every 3 (three) days.    Dispense:  3 tablet    Refill:  0     *If you need refills on other medications prior to your next appointment, please contact your pharmacy*  Follow-Up: Call back or seek an in-person evaluation if the symptoms worsen or if the condition fails to improve as anticipated.  [place] [place] [facility] [phone]  Other Instructions I have prescribed the yeast infection medicine for you.  You will take 3 doses of the medicine, 1 pill every 3  days.  Please contact your internal medicine center on Monday to arrange follow-up for your recurrent yeast infections and diabetes.  If you have been instructed to have an in-person evaluation today at a local Urgent Care facility, please use the link below. It will take you to a list of all of our available [facility], including address, phone number and hours of operation. Please do not delay care.  [facility]  If you or a family member do not have a primary care provider, use the link below to schedule a visit and establish care. When you choose a [facility] primary care physician or advanced practice provider, you gain a long-term partner in health. Find a Primary Care Provider  Learn more about [facility]'s in-office and virtual care options: [facility] - Get Care Now

## 2023-05-12 NOTE — Progress Notes (Signed)
 Virtual Visit Consent   Misty [name], you are scheduled for a virtual visit with a [facility] provider today. Just as with appointments in the office, your consent must be obtained to participate. Your consent will be active for this visit and any virtual visit you may have with one of our providers in the next 365 days. If you have a MyChart account, a copy of this consent can be sent to you electronically.  As this is a virtual visit, video technology does not allow for your provider to perform a traditional examination. This may limit your provider's ability to fully assess your condition. If your provider identifies any concerns that need to be evaluated in person or the need to arrange testing (such as labs, EKG, etc.), we will make arrangements to do so. Although advances in technology are sophisticated, we cannot ensure that it will always work on either your end or our end. If the connection with a video visit is poor, the visit may have to be switched to a telephone visit. With either a video or telephone visit, we are not always able to ensure that we have a secure connection.  By engaging in this virtual visit, you consent to the provision of healthcare and authorize for your insurance to be billed (if applicable) for the services provided during this visit. Depending on your insurance coverage, you may receive a charge related to this service.  I need to obtain your verbal consent now. Are you willing to proceed with your visit today? [name] has provided verbal consent on 05/12/2023 for a virtual visit (video or telephone). Blinda Burger, NP  Date: 05/12/2023 4:07 PM   Virtual Visit via Video Note   I, Blinda Burger, connected with  Misty [name]  ([id number], Nov 12, 1980) on 05/12/23 at  4:00 PM EDT by a video-enabled telemedicine application and verified that I am speaking with the correct person using two identifiers.  Location: Patient:  Virtual Visit Location Patient: Home Provider: Virtual Visit Location Provider: Home Office   I discussed the limitations of evaluation and management by telemedicine and the availability of in person appointments. The patient expressed understanding and agreed to proceed.    History of Present Illness: Misty [name] is a 43 y.o. who identifies as a female who was assigned female at birth, and is being seen today for vaginal yeast infection.  Symptoms started about 2 days ago.  Has vaginal itching and discharge.  She saw her internal medicine provider in March for the same symptoms.  Vaginal swab at that time tested positive for Candida vaginitis and Candida glabrata.  At that time she was treated with a course of fluconazole  150 mg for 5 days.  Patient reports she took all of that medicine as prescribed, and her symptoms completely disappeared and she has felt well and symptom-free until 2 days ago.  She feels like she gets recurrent yeast difficult at times she does not understand why she keeps getting them.  Review of records shows that she has a history of diabetes and her last A1c was in January and was 10.3.  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   Other chronic pain 07/14/2022   Pure hypercholesterolemia 06/23/2022   Myofascial pain dysfunction syndrome 11/25/2018   Nerve pain 11/25/2018  Essential hypertension 09/19/2018   Insomnia 09/19/2018   Left ankle pain 06/25/2018   Lumbar spine pain 06/25/2018   Thoracic spine pain 06/25/2018   Cervical spine pain 06/25/2018   Hidradenitis suppurativa 01/18/2018   Constipation 10/03/2017   Morbid obesity (HCC) 08/17/2017   Tobacco abuse 06/03/2012   Preventative health care 05/03/2011   Diabetes mellitus with neuropathy (HCC)  02/24/2008   Polycystic ovarian disease 07/27/2006   GERD (gastroesophageal reflux disease) 03/02/2006    Allergies: No Known Allergies Medications:  Current Outpatient Medications:    fluconazole  (DIFLUCAN ) 150 MG tablet, Take 1 tablet (150 mg total) by mouth every 3 (three) days., Disp: 3 tablet, Rfl: 0   Accu-Chek FastClix Lancets MISC, Use twice per day to check your blood glucose.  E11.65, Disp: 100 each, Rfl: 3   acetaminophen  (TYLENOL ) 500 MG tablet, Take 1,500 mg by mouth daily., Disp: , Rfl:    aspirin  EC 81 MG tablet, Take 1 tablet (81 mg total) by mouth daily. Swallow whole., Disp: 90 tablet, Rfl: 3   Blood Glucose Monitoring Suppl (ACCU-CHEK GUIDE) w/Device KIT, Use to check blood glucose., Disp: 1 kit, Rfl: 0   Continuous Glucose Sensor (FREESTYLE LIBRE 3 SENSOR) MISC, Place 1 sensor on the skin every 14 days. Use to check glucose continuously, Disp: 2 each, Rfl: 3   diltiazem  (CARDIZEM  CD) 360 MG 24 hr capsule, Take 1 capsule (360 mg total) by mouth daily., Disp: 90 capsule, Rfl: 3   Ergocalciferol  (VITAMIN D2) 10 MCG (400 UNIT) TABS, Take 2 tablets by mouth daily before breakfast., Disp: 90 tablet, Rfl: 3   glucose blood (ACCU-CHEK GUIDE) test strip, Use one strip, twice daily to check your blood glucose.  E11.65, Disp: 100 each, Rfl: 12   isosorbide  mononitrate (IMDUR ) 60 MG 24 hr tablet, Take 1 tablet (60 mg total) by mouth daily., Disp: 90 tablet, Rfl: 3   LANTUS  SOLOSTAR 100 UNIT/ML Solostar Pen, Inject 10 Units into the skin in the morning and at bedtime. 10 units in the am, and 10 units at night., Disp: , Rfl:    lisinopril  (ZESTRIL ) 10 MG tablet, Take 1 tablet (10 mg total) by mouth daily., Disp: 90 tablet, Rfl: 3   metFORMIN  (GLUCOPHAGE ) 500 MG tablet, Take 1 tablet (500 mg total) by mouth 2 (two) times daily., Disp: 180 tablet, Rfl: 3   nicotine  (NICODERM CQ  - DOSED IN MG/24 HOURS) 14 mg/24hr patch, Place 1 patch (14 mg total) onto the skin daily., Disp: 30 patch, Rfl: 0    nitroGLYCERIN  (NITRODUR - DOSED IN MG/24 HR) 0.6 mg/hr patch, Place 1 patch (0.6 mg total) onto the skin daily., Disp: 30 patch, Rfl: 12   nitroGLYCERIN  (NITROSTAT ) 0.4 MG SL tablet, Place 1 tablet (0.4 mg total) under the tongue every 5 (five) minutes as needed for chest pain., Disp: 25 tablet, Rfl: 3   oxyCODONE -acetaminophen  (PERCOCET) 10-325 MG tablet, Take 1 tablet by mouth every 6 (six) hours as needed for pain., Disp: , Rfl:    pregabalin (LYRICA) 75 MG capsule, Take 75 mg by mouth 2 (two) times daily., Disp: , Rfl:    rosuvastatin  (CRESTOR ) 20 MG tablet, Take 1 tablet (20 mg total) by mouth daily., Disp: 90 tablet, Rfl: 3   Semaglutide ,0.25 or 0.5MG /DOS, 2 MG/3ML SOPN, Inject 0.25 mg into the skin once a week., Disp: 3 mL, Rfl: 3  Observations/Objective: Patient is well-developed, well-nourished in no acute distress.  Resting comfortably  at home.  Head is normocephalic, atraumatic.  No labored  breathing.  Speech is clear and coherent with logical content.  Patient is alert and oriented at baseline.    Assessment and Plan: 1. Candida vaginitis (Primary)  Based on symptoms, and her prior experience with yeast infections, I do suspect she has 1 again.  She and I talked about how high blood sugar can make her predisposed to this kind of problem.  Encouraged her to reach out to her PCPs office on Monday (today is Saturday) to arrange appropriate follow-up for recheck of recurrent Candida vaginitis and diabetes.  She may need different strategies to improve blood sugar control and given the number of yeast infections in the last short period of time, she may need a longer term therapy such as vaginal boric acid  Follow Up Instructions: I discussed the assessment and treatment plan with the patient. The patient was provided an opportunity to ask questions and all were answered. The patient agreed with the plan and demonstrated an understanding of the instructions.  A copy of instructions  were sent to the patient via MyChart unless otherwise noted below.    The patient was advised to call back or seek an in-person evaluation if the symptoms worsen or if the condition fails to improve as anticipated.    Blinda Burger, NP

## 2023-05-14 DIAGNOSIS — F1020 Alcohol dependence, uncomplicated: Secondary | ICD-10-CM | POA: Diagnosis not present

## 2023-05-15 DIAGNOSIS — F1020 Alcohol dependence, uncomplicated: Secondary | ICD-10-CM | POA: Diagnosis not present

## 2023-05-16 DIAGNOSIS — F1020 Alcohol dependence, uncomplicated: Secondary | ICD-10-CM | POA: Diagnosis not present

## 2023-05-17 DIAGNOSIS — F1020 Alcohol dependence, uncomplicated: Secondary | ICD-10-CM | POA: Diagnosis not present

## 2023-05-18 DIAGNOSIS — F1020 Alcohol dependence, uncomplicated: Secondary | ICD-10-CM | POA: Diagnosis not present

## 2023-05-21 DIAGNOSIS — F1020 Alcohol dependence, uncomplicated: Secondary | ICD-10-CM | POA: Diagnosis not present

## 2023-05-22 ENCOUNTER — Ambulatory Visit: Payer: Self-pay

## 2023-05-22 ENCOUNTER — Ambulatory Visit
Admission: EM | Admit: 2023-05-22 | Discharge: 2023-05-22 | Disposition: A | Discharge status: Home / Self Care | Payer: MEDICAID | Attending: Family Medicine | Admitting: Family Medicine

## 2023-05-22 DIAGNOSIS — N3001 Acute cystitis with hematuria: Secondary | ICD-10-CM | POA: Diagnosis not present

## 2023-05-22 DIAGNOSIS — R319 Hematuria, unspecified: Secondary | ICD-10-CM

## 2023-05-22 DIAGNOSIS — F1020 Alcohol dependence, uncomplicated: Secondary | ICD-10-CM | POA: Diagnosis not present

## 2023-05-22 HISTORY — DX: Polycystic ovarian syndrome: E28.2

## 2023-05-22 HISTORY — DX: Type 2 diabetes mellitus without complications: E11.9

## 2023-05-22 HISTORY — DX: Essential (primary) hypertension: I10

## 2023-05-22 LAB — POCT URINALYSIS DIP (MANUAL ENTRY)
Bilirubin, UA: NEGATIVE
Glucose, UA: >=1000 mg/dL — AB
Ketones, POC UA: NEGATIVE mg/dL
Leukocytes, UA: NEGATIVE
Nitrite, UA: NEGATIVE
Protein Ur, POC: NEGATIVE mg/dL
Spec Grav, UA: 1.01
Urobilinogen, UA: 0.2 U/dL
pH, UA: 6

## 2023-05-22 MED ORDER — CEPHALEXIN 500 MG PO CAPS: 500.0000 mg | ORAL_CAPSULE | Freq: Two times a day (BID) | ORAL | 0 refills | Status: DC

## 2023-05-22 NOTE — ED Provider Notes (Signed)
 [facility]  Note:  This document was prepared using Conservation officer, historic buildings and may include unintentional dictation errors.  MRN: [id number] DOB: May 29, 1980  Subjective:   Misty [name] is a 43 y.o. female presenting for 3-day history of acute onset lower abdominal/pelvic pressure worse when she urinates.  Denies fever, n/v, abdominal pain, rashes, dysuria, urinary frequency, vaginal discharge.  Has a history of ovarian cyst and uterine fibroids.  No concern for pregnancy.  Declines UPT.  Has type 2 diabetes treated with insulin , is poorly controlled with blood sugars ranging just above 300 currently per patient.  No respiratory symptoms, chest pain, diarrhea, constipation, bloody stools.  Patient does not drink much water.  She drinks Pepsi and fruit juice daily.  Advised against this both for her urogenital complaint but also because of her diabetes.  No current facility-administered medications for this encounter.  Current Outpatient Medications:    isosorbide  mononitrate (IMDUR ) 30 MG 24 hr tablet, Take 30 mg by mouth daily., Disp: , Rfl:    LINZESS 72 MCG capsule, Take 72 mcg by mouth daily., Disp: , Rfl:    lisinopril -hydrochlorothiazide  (ZESTORETIC ) 10-12.5 MG tablet, Take 1 tablet by mouth daily., Disp: , Rfl:    metFORMIN  (GLUCOPHAGE ) 500 MG tablet, Take 500 mg by mouth 2 (two) times daily., Disp: , Rfl:    pregabalin (LYRICA) 75 MG capsule, Take 75 mg by mouth 2 (two) times daily., Disp: , Rfl:    rosuvastatin  (CRESTOR ) 20 MG tablet, Take 20 mg by mouth at bedtime., Disp: , Rfl:    TIADYLT  ER 360 MG 24 hr capsule, Take by mouth., Disp: , Rfl:    Vitamin D , Ergocalciferol , (DRISDOL) 1.25 MG (50000 UNIT) CAPS capsule, Take 50,000 Units by mouth once a week., Disp: , Rfl:    No Known Allergies  Past Medical History:  Diagnosis Date   Diabetes mellitus without complication (HCC)    Hypertension    PCOS (polycystic ovarian syndrome)       History reviewed. No pertinent surgical history.  History reviewed. No pertinent family history.  Social History   Tobacco Use   Smoking status: Every Day    Types: Cigarettes   Smokeless tobacco: Current  Substance Use Topics   Alcohol use: Not Currently   Drug use: Never    ROS   Objective:   Vitals: BP (!) 145/84 (BP Location: Left Arm)   Pulse (!) 111   Temp 98.3 F (36.8 C) (Oral)   Resp 20   LMP 02/25/2023 (Approximate)   SpO2 97%   Physical Exam Constitutional:      General: She is not in acute distress.    Appearance: Normal appearance. She is well-developed. She is not ill-appearing, toxic-appearing or diaphoretic.  HENT:     Head: Normocephalic and atraumatic.     Nose: Nose normal.     Mouth/Throat:     Mouth: Mucous membranes are moist.  Eyes:     General: No scleral icterus.       Right eye: No discharge.        Left eye: No discharge.     Extraocular Movements: Extraocular movements intact.     Conjunctiva/sclera: Conjunctivae normal.  Cardiovascular:     Rate and Rhythm: Normal rate.  Pulmonary:     Effort: Pulmonary effort is normal.  Abdominal:     General: Bowel sounds are normal. There is no distension.     Palpations: Abdomen is soft. There is no mass.  Tenderness: There is no abdominal tenderness. There is no right CVA tenderness, left CVA tenderness, guarding or rebound.  Musculoskeletal:     Lumbar back: No swelling, edema, deformity, signs of trauma, lacerations, spasms, tenderness or bony tenderness. Normal range of motion. Negative right straight leg raise test and negative left straight leg raise test. No scoliosis.  Skin:    General: Skin is warm and dry.  Neurological:     General: No focal deficit present.     Mental Status: She is alert and oriented to person, place, and time.  Psychiatric:        Mood and Affect: Mood normal.        Behavior: Behavior normal.        Thought Content: Thought content normal.         Judgment: Judgment normal.     Results for orders placed or performed during the hospital encounter of 05/22/23 (from the past 24 hours)  POCT urinalysis dipstick     Status: Abnormal   Collection Time: 05/22/23 10:55 AM  Result Value Ref Range   Color, UA yellow    Clarity, UA clear    Glucose, UA >=1,000 (A) mg/dL   Bilirubin, UA negative    Ketones, POC UA negative mg/dL   Spec Grav, UA 1.610    Blood, UA moderate (A)    pH, UA 6.0    Protein Ur, POC negative mg/dL   Urobilinogen, UA 0.2 E.U./dL   Nitrite, UA Negative    Leukocytes, UA Negative     Assessment and Plan :   PDMP not reviewed this encounter.  1. Acute cystitis with hematuria   2. Hematuria, unspecified type    Start cephalexin  to cover for acute cystitis, urine culture pending.  Recommended aggressive hydration, limiting urinary irritants. Counseled patient on potential for adverse effects with medications prescribed/recommended today, ER and return-to-clinic precautions discussed, patient verbalized understanding.    Adolph Hoop, [place] 05/22/23 1235

## 2023-05-22 NOTE — Telephone Encounter (Signed)
 I called and spoke to the patient she stated she is having lower abdominal pain radiating towards her legs. I offered the patient a appointment for our next available which would be Thursday or the option of going to the urgent care to be seen sooner. Patient stated she will go to the urgent care.

## 2023-05-22 NOTE — Discharge Instructions (Signed)
 Please start Keflex to address an urinary tract infection. Make sure you hydrate very well with plain water and a quantity of 80 ounces of water a day.  Please limit drinks that are considered urinary irritants such as soda, sweet tea, coffee, energy drinks, alcohol.  These can worsen your urinary and genital symptoms but also be the source of them.  I will let you know about your urine culture results through MyChart to see if we need to prescribe or change your antibiotics based off of those results.

## 2023-05-22 NOTE — Telephone Encounter (Signed)
 Copied from CRM [id number]. Topic: Clinical - Red Word Triage >> May 22, 2023  8:06 AM Danelle Dunning F wrote: [facility] that prompted transfer to Nurse Triage: Pressure in stomach; causing slight incontinence; painful  Pain Level: 8/10    Chief Complaint: Lower abdominal pain, goes down legs. No availability, asking to be worked in. No answer on CAL line Symptoms: Blood in urine, constipation Frequency: Sunday Pertinent Negatives: Patient denies fever Disposition: [] ED /[] Urgent Care (no appt availability in office) / [] Appointment(In office/virtual)/ []  [facility]/ [] Home Care/ [] Refused Recommended Disposition /[] [facility]/ [x]  Follow-up with PCP Additional Notes: Please advise pt.  Reason for Disposition  [1] MILD-MODERATE pain AND [2] constant AND [3] present > 2 hours  Answer Assessment - Initial Assessment Questions 1. LOCATION: "Where does it hurt?"      Lower 2. RADIATION: "Does the pain shoot anywhere else?" (e.g., chest, back)     Down legs 3. ONSET: "When did the pain begin?" (e.g., minutes, hours or days ago)      Sunday 4. SUDDEN: "Gradual or sudden onset?"     sudden 5. PATTERN "Does the pain come and go, or is it constant?"    - If it comes and goes: "How long does it last?" "Do you have pain now?"     (Note: Comes and goes means the pain is intermittent. It goes away completely between bouts.)    - If constant: "Is it getting better, staying the same, or getting worse?"      (Note: Constant means the pain never goes away completely; most serious pain is constant and gets worse.)      constant 6. SEVERITY: "How bad is the pain?"  (e.g., Scale 1-10; mild, moderate, or severe)    - MILD (1-3): Doesn't interfere with normal activities, abdomen soft and not tender to touch.     - MODERATE (4-7): Interferes with normal activities or awakens from sleep, abdomen tender to touch.     - SEVERE (8-10): Excruciating pain, doubled over, unable to do any normal  activities.       8 7. RECURRENT SYMPTOM: "Have you ever had this type of stomach pain before?" If Yes, ask: "When was the last time?" and "What happened that time?"      yes 8. CAUSE: "What do you think is causing the stomach pain?"     unsure 9. RELIEVING/AGGRAVATING FACTORS: "What makes it better or worse?" (e.g., antacids, bending or twisting motion, bowel movement)     no 10. OTHER SYMPTOMS: "Do you have any other symptoms?" (e.g., back pain, diarrhea, fever, urination pain, vomiting)       no 11. PREGNANCY: "Is there any chance you are pregnant?" "When was your last menstrual period?"       no  Protocols used: Abdominal Pain - [facility]

## 2023-05-22 NOTE — ED Triage Notes (Signed)
 Patient reports lower abdominal pain and pressure x 3 days. Patient states she has blood in her urine.  Last menstrual cycle was 02/2023

## 2023-05-23 ENCOUNTER — Encounter: Payer: Self-pay | Admitting: Student

## 2023-05-23 DIAGNOSIS — F1020 Alcohol dependence, uncomplicated: Secondary | ICD-10-CM | POA: Diagnosis not present

## 2023-05-24 ENCOUNTER — Telehealth: Payer: Self-pay | Admitting: *Deleted

## 2023-05-24 DIAGNOSIS — F1020 Alcohol dependence, uncomplicated: Secondary | ICD-10-CM | POA: Diagnosis not present

## 2023-05-24 LAB — URINE CULTURE

## 2023-05-24 NOTE — Telephone Encounter (Signed)
 I called pt who stated she went to [facility] on May 20th ; had blood in her urine - given rx for an abx for UTI. Stated having a lot of lower abd pain which radiates down her leg. Also c/o constipation - taking laxatives. Stated the abd pain was so bad she double up on the med. Advised pt to take all of the abx that was prescribed.Stated she has taken Linzess before for constipation. No available appts today - offered appt tomorrow @ [phone] - stated she has to take her daughter to the dentist. Pt stated if the abd pain does not ease up she will go back to UC.

## 2023-05-24 NOTE — Telephone Encounter (Signed)
 Please refer to message below.    Thanks,    [place]  ----- Message -----  From: Misty [name]  Sent: 05/24/2023  11:40 AM EDT  To: [name]  Subject: Appointment Request                              Appointment Request From: Misty [name]    With Provider: [facility] Of [facility]]    Preferred Date Range: Any    Preferred Times: Any Time    Reason for visit: Office Visit    Health Maintenance Topic:    Comments:  I am having a lot of abdominal pain and still have blood in my urine

## 2023-05-25 DIAGNOSIS — F1020 Alcohol dependence, uncomplicated: Secondary | ICD-10-CM | POA: Diagnosis not present

## 2023-05-28 ENCOUNTER — Ambulatory Visit (HOSPITAL_COMMUNITY): Payer: Self-pay

## 2023-05-28 DIAGNOSIS — F1020 Alcohol dependence, uncomplicated: Secondary | ICD-10-CM | POA: Diagnosis not present

## 2023-05-29 DIAGNOSIS — F1020 Alcohol dependence, uncomplicated: Secondary | ICD-10-CM | POA: Diagnosis not present

## 2023-05-30 DIAGNOSIS — M5030 Other cervical disc degeneration, unspecified cervical region: Secondary | ICD-10-CM | POA: Diagnosis not present

## 2023-05-30 DIAGNOSIS — E669 Obesity, unspecified: Secondary | ICD-10-CM | POA: Diagnosis not present

## 2023-05-30 DIAGNOSIS — K219 Gastro-esophageal reflux disease without esophagitis: Secondary | ICD-10-CM | POA: Diagnosis not present

## 2023-05-30 DIAGNOSIS — F1020 Alcohol dependence, uncomplicated: Secondary | ICD-10-CM | POA: Diagnosis not present

## 2023-05-30 DIAGNOSIS — G8929 Other chronic pain: Secondary | ICD-10-CM | POA: Diagnosis not present

## 2023-05-30 DIAGNOSIS — Z79899 Other long term (current) drug therapy: Secondary | ICD-10-CM | POA: Diagnosis not present

## 2023-05-30 DIAGNOSIS — M549 Dorsalgia, unspecified: Secondary | ICD-10-CM | POA: Diagnosis not present

## 2023-05-30 DIAGNOSIS — K5909 Other constipation: Secondary | ICD-10-CM | POA: Diagnosis not present

## 2023-05-30 DIAGNOSIS — E118 Type 2 diabetes mellitus with unspecified complications: Secondary | ICD-10-CM | POA: Diagnosis not present

## 2023-05-30 DIAGNOSIS — N914 Secondary oligomenorrhea: Secondary | ICD-10-CM | POA: Diagnosis not present

## 2023-05-30 DIAGNOSIS — E559 Vitamin D deficiency, unspecified: Secondary | ICD-10-CM | POA: Diagnosis not present

## 2023-05-31 ENCOUNTER — Encounter: Payer: Self-pay | Admitting: Dietician

## 2023-05-31 ENCOUNTER — Encounter: Payer: Self-pay | Admitting: Internal Medicine

## 2023-05-31 ENCOUNTER — Other Ambulatory Visit: Payer: Self-pay | Admitting: Internal Medicine

## 2023-05-31 ENCOUNTER — Ambulatory Visit (INDEPENDENT_AMBULATORY_CARE_PROVIDER_SITE_OTHER): Payer: MEDICAID | Admitting: Internal Medicine

## 2023-05-31 VITALS — BP 133/68 | HR 94 | Temp 98.3°F | Ht 73.0 in | Wt 244.6 lb

## 2023-05-31 DIAGNOSIS — Z7984 Long term (current) use of oral hypoglycemic drugs: Secondary | ICD-10-CM | POA: Diagnosis not present

## 2023-05-31 DIAGNOSIS — N3001 Acute cystitis with hematuria: Secondary | ICD-10-CM

## 2023-05-31 DIAGNOSIS — E1140 Type 2 diabetes mellitus with diabetic neuropathy, unspecified: Secondary | ICD-10-CM | POA: Diagnosis not present

## 2023-05-31 DIAGNOSIS — R1030 Lower abdominal pain, unspecified: Secondary | ICD-10-CM

## 2023-05-31 DIAGNOSIS — K5900 Constipation, unspecified: Secondary | ICD-10-CM

## 2023-05-31 DIAGNOSIS — Z7985 Long-term (current) use of injectable non-insulin antidiabetic drugs: Secondary | ICD-10-CM | POA: Diagnosis not present

## 2023-05-31 DIAGNOSIS — Z79899 Other long term (current) drug therapy: Secondary | ICD-10-CM | POA: Diagnosis not present

## 2023-05-31 DIAGNOSIS — Z794 Long term (current) use of insulin: Secondary | ICD-10-CM

## 2023-05-31 DIAGNOSIS — F1020 Alcohol dependence, uncomplicated: Secondary | ICD-10-CM | POA: Diagnosis not present

## 2023-05-31 DIAGNOSIS — R319 Hematuria, unspecified: Secondary | ICD-10-CM

## 2023-05-31 LAB — POCT URINALYSIS DIPSTICK
Bilirubin, UA: NEGATIVE
Glucose, UA: POSITIVE — AB
Ketones, UA: NEGATIVE
Leukocytes, UA: NEGATIVE
Nitrite, UA: NEGATIVE
Protein, UA: NEGATIVE
Spec Grav, UA: 1.015 (ref 1.010–1.025)
Urobilinogen, UA: 1 U/dL
pH, UA: 6.5 (ref 5.0–8.0)

## 2023-05-31 LAB — POCT GLYCOSYLATED HEMOGLOBIN (HGB A1C): Hemoglobin A1C: 12.7 % — AB (ref 4.0–5.6)

## 2023-05-31 LAB — GLUCOSE, CAPILLARY: Glucose-Capillary: 363 mg/dL — ABNORMAL HIGH (ref 70–99)

## 2023-05-31 LAB — POCT URINE PREGNANCY: Preg Test, Ur: NEGATIVE

## 2023-05-31 MED ORDER — METFORMIN HCL ER 500 MG PO TB24: 500.0000 mg | ORAL_TABLET | Freq: Every day | ORAL | 5 refills | Status: AC

## 2023-05-31 MED ORDER — LANTUS SOLOSTAR 100 UNIT/ML ~~LOC~~ SOPN: 20.0000 [IU] | PEN_INJECTOR | Freq: Every day | SUBCUTANEOUS | 5 refills | Status: AC

## 2023-05-31 MED ORDER — SEMAGLUTIDE(0.25 OR 0.5MG/DOS) 2 MG/3ML ~~LOC~~ SOPN: PEN_INJECTOR | SUBCUTANEOUS | 3 refills | Status: AC

## 2023-05-31 MED ORDER — SENNA 8.6 MG PO TABS: 2.0000 | ORAL_TABLET | Freq: Every day | ORAL | 2 refills | Status: DC

## 2023-05-31 NOTE — Assessment & Plan Note (Signed)
 Constipation is not causing her hematuria but certainly could play a role in her lower abdominal pain.  Given she is on opioids I will try to go ahead and start senna hopefully this can help with some of her chronic constipation.

## 2023-05-31 NOTE — Progress Notes (Signed)
 Established Patient Office Visit  Subjective   Patient ID: Misty [name], female    DOB: 1980/12/08  Age: 43 y.o. MRN: [id number]  Chief Complaint  Patient presents with   UC Follow-up Visit    Blood in urine. Lower back pain. Constipation - Linzess was increased.   Misty [name] presents today to follow-up a recent urgent care visit.  She was seen on the 20th for complaints of lower abdominal pain bladder pressure and blood in the urine.  Urine dipstick showed moderate hemoglobin and large glucose.  She was treated with a 5-day course of Keflex  urine culture obtained at that time was unrevealing.  The Keflex  caused no change in symptoms.  She reports continued symptoms of bilateral lower flank pain and lower abdominal pressure.  Feels increased pressure while urinating denies any uterine prolapse.  Does report constipation which has been a chronic issue and notes Linzess was increased.  Takes chronic opioids not on senna or laxatives.  Denies fever or chills.     Objective:      BP 133/68 (BP Location: Right Arm, Patient Position: Sitting, Cuff Size: Large)   Pulse 94   Temp 98.3 F (36.8 C) (Oral)   Ht 6\' 1"  (1.854 m)   Wt 244 lb 9.6 oz (110.9 kg)   LMP 02/25/2023 (Approximate)   SpO2 100% Comment: RA  BMI 32.27 kg/m    Physical Exam Constitutional:      Appearance: Normal appearance.  Abdominal:     General: Abdomen is flat. Bowel sounds are normal.     Palpations: Abdomen is soft.     Comments: Mild midline lower abdominal tenderness  Musculoskeletal:     Comments: No CVA tenderness, mild paraspinal tenderness in bilateral lumbar area.  Neurological:     Mental Status: She is alert.      Results for orders placed or performed in visit on 05/31/23  Glucose, capillary  Result Value Ref Range   Glucose-Capillary 363 (H) 70 - 99 mg/dL  POC Hbg W0J  Result Value Ref Range   Hemoglobin A1C 12.7 (A) 4.0 - 5.6 %   HbA1c POC (<> result, manual entry)      HbA1c, POC (prediabetic range)     HbA1c, POC (controlled diabetic range)    POCT Urinalysis Dipstick (81002)  Result Value Ref Range   Color, UA Red    Clarity, UA Slightly cloudy    Glucose, UA Positive (A) Negative   Bilirubin, UA Negative    Ketones, UA Negative    Spec Grav, UA 1.015 1.010 - 1.025   Blood, UA Large    pH, UA 6.5 5.0 - 8.0   Protein, UA Negative Negative   Urobilinogen, UA 1.0 0.2 or 1.0 E.U./dL   Nitrite, UA Negative    Leukocytes, UA Negative Negative   Odor None   POCT Urine Pregnancy  Result Value Ref Range   Preg Test, Ur Negative Negative      The ASCVD Risk score (Arnett DK, et al., 2019) failed to calculate for the following reasons:   Risk score cannot be calculated because patient has a medical history suggesting prior/existing ASCVD    Assessment & Plan:   Problem List Items Addressed This Visit       Endocrine   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  I have discussed with her that her glucose urea could contribute to UTIs as  well as dehydration which could promote renal stones and that we really need to get this under better control.  I see no reason that she needs to take Lantus  twice a day especially in the setting of poor adherence and so we will consolidate her 10 units twice daily to 20 units daily.  In the setting of her chronic GI symptoms I think doing metformin  extended release 500 mg twice daily is simpler and hopefully will promote better adherence.  And I will start her back on Ozempic  low-dose given her GI symptoms and titrate up slowly.      Relevant Medications   insulin  glargine (LANTUS  SOLOSTAR) 100 UNIT/ML Solostar Pen   Semaglutide ,0.25 or 0.5MG /DOS, 2 MG/3ML SOPN   metFORMIN  (GLUCOPHAGE -XR) 500 MG 24 hr tablet   Other Relevant Orders   POC Hbg A1C (Completed)     Other   Lower abdominal pain   Relevant Orders   CT RENAL  STONE STUDY   Hematuria   On urine dipstick she again had large hemoglobin would want to confirm that this is truly hematuria with a UA.  Given no leukocytes or nitrites and lack of response to previous antibiotics I am thinking this is less likely a UTI.  Certainly could be a urinary or bladder stone so will obtain a renal stone CT      Relevant Orders   CT RENAL STONE STUDY   Constipation   Constipation is not causing her hematuria but certainly could play a role in her lower abdominal pain.  Given she is on opioids I will try to go ahead and start senna hopefully this can help with some of her chronic constipation.      Other Visit Diagnoses       Acute cystitis with hematuria       Relevant Orders   Urinalysis, Reflex Microscopic   Culture, Urine   POCT Urinalysis Dipstick (65784) (Completed)   POCT Urine Pregnancy (Completed)       Return in about 3 months (around 08/31/2023), or if symptoms worsen or fail to improve, for DM.    Priscella Brooms, DO

## 2023-05-31 NOTE — Patient Instructions (Signed)
 I want you to go back on your diabetes medications.   You can take all 20 units of lantus  at one time but keep it consistent.  I want to check you for a kidney stone.

## 2023-05-31 NOTE — Assessment & Plan Note (Signed)
 On urine dipstick she again had large hemoglobin would want to confirm that this is truly hematuria with a UA.  Given no leukocytes or nitrites and lack of response to previous antibiotics I am thinking this is less likely a UTI.  Certainly could be a urinary or bladder stone so will obtain a renal stone CT

## 2023-05-31 NOTE — Assessment & Plan Note (Signed)
 She has not been taking metformin , Ozempic  or Lantus  with any regularity.  I have discussed with her that her glucose urea could contribute to UTIs as well as dehydration which could promote renal stones and that we really need to get this under better control.  I see no reason that she needs to take Lantus  twice a day especially in the setting of poor adherence and so we will consolidate her 10 units twice daily to 20 units daily.  In the setting of her chronic GI symptoms I think doing metformin  extended release 500 mg twice daily is simpler and hopefully will promote better adherence.  And I will start her back on Ozempic  low-dose given her GI symptoms and titrate up slowly.

## 2023-06-01 ENCOUNTER — Ambulatory Visit: Payer: Self-pay | Admitting: Internal Medicine

## 2023-06-01 ENCOUNTER — Telehealth: Payer: Self-pay

## 2023-06-01 DIAGNOSIS — F1020 Alcohol dependence, uncomplicated: Secondary | ICD-10-CM | POA: Diagnosis not present

## 2023-06-01 LAB — URINALYSIS, ROUTINE W REFLEX MICROSCOPIC
Bilirubin, UA: NEGATIVE
Ketones, UA: NEGATIVE
Nitrite, UA: NEGATIVE
Specific Gravity, UA: >=1.03 — AB (ref 1.005–1.030)
Urobilinogen, Ur: 1 mg/dL (ref 0.2–1.0)
pH, UA: 6.5 (ref 5.0–7.5)

## 2023-06-01 LAB — MICROSCOPIC EXAMINATION
Bacteria, UA: NONE SEEN
Casts: NONE SEEN /LPF

## 2023-06-01 NOTE — Telephone Encounter (Signed)
 Prior Authorization for patient (Ozempic  (0.25 or 0.5 MG/DOSE) 2MG /3ML pen-injectors) came through on cover my meds was submitted with last office notes and labs awaiting approval or denial.  ZOX:[id number]

## 2023-06-01 NOTE — Telephone Encounter (Signed)
 Misty [name] (Key: [id number]) Rx #: [id number] Ozempic  (0.25 or 0.5 MG/DOSE) 2MG /3ML pen-injectors Form [employer] Electronic Prior Authorization Form Created Sent to Plan Plan Response Submit Clinical Questions Determination Favorable Message from Plan Approved. OZEMPIC  (0.25 OR 0.5 MG/DOSE) 2MG /3ML Soln Pen-inj is approved from 06/01/2023 to 05/31/2024. All strengths of the drug are approved.. Authorization Expiration Date: May 31, 2024.

## 2023-06-02 LAB — URINE CULTURE

## 2023-06-05 ENCOUNTER — Ambulatory Visit
Admission: RE | Admit: 2023-06-05 | Discharge: 2023-06-05 | Discharge status: Home / Self Care | Payer: MEDICAID | Source: Ambulatory Visit | Attending: Internal Medicine | Admitting: Internal Medicine

## 2023-06-05 DIAGNOSIS — R319 Hematuria, unspecified: Secondary | ICD-10-CM

## 2023-06-05 DIAGNOSIS — R1030 Lower abdominal pain, unspecified: Secondary | ICD-10-CM

## 2023-06-14 ENCOUNTER — Encounter: Payer: Self-pay | Admitting: Student

## 2023-06-14 ENCOUNTER — Ambulatory Visit (INDEPENDENT_AMBULATORY_CARE_PROVIDER_SITE_OTHER): Admitting: Student

## 2023-06-14 VITALS — BP 148/80 | HR 100 | Temp 99.5°F | Ht 73.0 in | Wt 244.4 lb

## 2023-06-14 DIAGNOSIS — E119 Type 2 diabetes mellitus without complications: Secondary | ICD-10-CM

## 2023-06-14 DIAGNOSIS — Z419 Encounter for procedure for purposes other than remedying health state, unspecified: Secondary | ICD-10-CM | POA: Diagnosis not present

## 2023-06-14 DIAGNOSIS — R1030 Lower abdominal pain, unspecified: Secondary | ICD-10-CM | POA: Diagnosis not present

## 2023-06-14 DIAGNOSIS — R109 Unspecified abdominal pain: Secondary | ICD-10-CM | POA: Diagnosis not present

## 2023-06-14 DIAGNOSIS — E1140 Type 2 diabetes mellitus with diabetic neuropathy, unspecified: Secondary | ICD-10-CM

## 2023-06-14 DIAGNOSIS — Z794 Long term (current) use of insulin: Secondary | ICD-10-CM

## 2023-06-14 LAB — COMPREHENSIVE METABOLIC PANEL WITH GFR
ALT: 14 IU/L (ref 0–32)
AST: 18 IU/L (ref 0–40)
Albumin: 4 g/dL (ref 3.9–4.9)
Alkaline Phosphatase: 78 IU/L (ref 44–121)
BUN/Creatinine Ratio: 20 (ref 9–23)
BUN: 10 mg/dL (ref 6–24)
Bilirubin Total: 0.3 mg/dL (ref 0.0–1.2)
CO2: 25 mmol/L (ref 20–29)
Calcium: 8.7 mg/dL (ref 8.7–10.2)
Chloride: 102 mmol/L (ref 96–106)
Creatinine, Ser: 0.5 mg/dL — ABNORMAL LOW (ref 0.57–1.00)
Globulin, Total: 2.8 g/dL (ref 1.5–4.5)
Glucose: 246 mg/dL — ABNORMAL HIGH (ref 70–99)
Potassium: 3.9 mmol/L (ref 3.5–5.2)
Sodium: 138 mmol/L (ref 134–144)
Total Protein: 6.8 g/dL (ref 6.0–8.5)
eGFR: 119 mL/min/{1.73_m2} (ref >59)

## 2023-06-14 LAB — CBC WITH DIFFERENTIAL/PLATELET
Basophils Absolute: 0 10*3/uL (ref 0.0–0.2)
Basos: 0 %
EOS (ABSOLUTE): 0.1 10*3/uL (ref 0.0–0.4)
Eos: 1 %
Hematocrit: 40.2 % (ref 34.0–46.6)
Hemoglobin: 12.8 g/dL (ref 11.1–15.9)
Lymphocytes Absolute: 3.1 10*3/uL (ref 0.7–3.1)
Lymphs: 24 %
MCH: 26.7 pg (ref 26.6–33.0)
MCHC: 31.8 g/dL (ref 31.5–35.7)
MCV: 84 fL (ref 79–97)
Monocytes Absolute: 1 10*3/uL — ABNORMAL HIGH (ref 0.1–0.9)
Monocytes: 8 %
Neutrophils Absolute: 8.7 10*3/uL — ABNORMAL HIGH (ref 1.4–7.0)
Neutrophils: 67 %
Platelets: 219 10*3/uL (ref 150–450)
RBC: 4.79 x10E6/uL (ref 3.77–5.28)
RDW: 13.6 % (ref 11.7–15.4)
WBC: 13 10*3/uL — ABNORMAL HIGH (ref 3.4–10.8)

## 2023-06-14 LAB — POCT URINALYSIS DIPSTICK
Bilirubin, UA: NEGATIVE
Glucose, UA: POSITIVE — AB
Nitrite, UA: NEGATIVE
Protein, UA: POSITIVE — AB
Spec Grav, UA: 1.025 (ref 1.010–1.025)
Urobilinogen, UA: 0.2 U/dL
pH, UA: 6 (ref 5.0–8.0)

## 2023-06-14 LAB — GLUCOSE, CAPILLARY: Glucose-Capillary: 258 mg/dL — ABNORMAL HIGH (ref 70–99)

## 2023-06-14 LAB — LIPASE: Lipase: 26 U/L (ref 14–72)

## 2023-06-14 NOTE — Assessment & Plan Note (Addendum)
 Reports worsening abdominal pain for past month. Describes as constant sharp pain of lower abdomen and pelvis. Denies n/v/d but has had poor po intake due to pain. Reports chills but no recorded fever. Reports abdomen pain with voiding or BM. Just ended her menstrual cycle 1-2 days ago that was about 2-3 weeks. Abdominal pain not the same as menstrual pain. No vaginal discharge or itching, dysuria or hematuria, hematochezia. Not sexually active for months. Hx of BV in past, denies hx of STD for herself and partner. Denies constipation but has hx of it, taking Linzess with BM yesterday and passing flatus today.   Reports similar symptoms in 2019, where she was admitted for acute diffuse mesenteric inflammation with interloop and free pelvic fluid. Last OV was 5/29 for this pain, negative urine pregnancy. UA with microscopic hematuria. Had c/f for renal stone but CT renal without acute findings and no free fluid noted. No hx of abdominal surgery except for C-section.   CBG today 258. Urine dipstick with trace ketones, blood, leukocytes but no nitrites. Exam with distention and diffuse TTP with guarding.   DDX: DKA (given uncontrolled DM), colitis, appendicitis, pancreatitis, gallbladder disease, but more lower abdominal pain bilaterally and not localized; PID, endometriosis (recent menses and hx of PCOS but uterus normal on recent CT)   Discussed with patient and significant other regarding concerns. She does not want to go to ED right now, vitals are stable with mild tachycardia but afebrile, tolerating po intake still. Discussed STAT labs and CT a/p with contrast. However, discussed if worsening symptoms or new symptoms to go to ED which patient verbalizes understanding.   Plan -UA complete w reflex -CBC w diff, CMP, lipase -CT A/P w contrast  -Return precautions given to go to ED if symptoms worsen or additional symptoms develop

## 2023-06-14 NOTE — Progress Notes (Signed)
 CC: ongoing abdominal pain  HPI:  [name] is a 43 y.o. female living with a history stated below and presents today for abdominal pain. Please see problem based assessment and plan for additional details.  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  E11.65 100 each 3   acetaminophen  (TYLENOL ) 500 MG tablet Take 1,500 mg by mouth daily.     aspirin  EC 81 MG tablet Take 1 tablet (81 mg total) by mouth daily. Swallow whole. 90 tablet 3   Blood Glucose Monitoring Suppl (ACCU-CHEK GUIDE) w/Device KIT Use to check blood glucose. 1 kit 0   cephALEXin  (KEFLEX ) 500 MG capsule Take 1 capsule (500 mg total) by mouth 2 (two) times daily. 10 capsule 0   Continuous Glucose Sensor (FREESTYLE LIBRE 3 SENSOR) MISC Place 1 sensor on the skin every 14 days. Use to check glucose continuously 2 each 3   diltiazem  (CARDIZEM  CD) 360 MG 24 hr capsule Take 1 capsule (360 mg total) by mouth daily. 90 capsule 3   Ergocalciferol  (VITAMIN D2) 10 MCG (400 UNIT) TABS Take 2 tablets by mouth daily before breakfast. 90 tablet 3   fluconazole  (DIFLUCAN ) 150 MG tablet Take 1 tablet (150 mg total) by mouth every 3 (three) days. 3 tablet 0   glucose blood (ACCU-CHEK GUIDE) test  strip Use one strip, twice daily to check your blood glucose.  E11.65 100 each 12   insulin  glargine (LANTUS  SOLOSTAR) 100 UNIT/ML Solostar Pen Inject 20 Units into the skin daily. 10 units in the am, and 10 units at night. 15 mL 5   isosorbide  mononitrate (IMDUR ) 30 MG 24 hr tablet Take 30 mg by mouth daily.     isosorbide  mononitrate (IMDUR ) 60 MG 24 hr tablet Take 1 tablet (60 mg total) by mouth daily. 90 tablet 3   LINZESS 72 MCG capsule Take 72 mcg by mouth daily.     lisinopril  (ZESTRIL ) 10 MG tablet Take 1 tablet (10 mg total) by mouth daily. 90 tablet 3   lisinopril -hydrochlorothiazide  (ZESTORETIC ) 10-12.5 MG tablet Take 1 tablet by mouth daily.     metFORMIN  (GLUCOPHAGE -XR) 500 MG 24 hr tablet Take 1 tablet (500 mg total) by mouth daily with breakfast. 60 tablet 5   nicotine  (NICODERM CQ  - DOSED IN MG/24 HOURS) 14 mg/24hr patch Place 1 patch (14 mg total) onto the skin daily. 30 patch 0   nitroGLYCERIN  (NITRODUR - DOSED IN MG/24 HR) 0.6 mg/hr patch Place 1 patch (0.6 mg total) onto the skin daily. 30 patch 12   nitroGLYCERIN  (NITROSTAT ) 0.4 MG SL tablet Place 1 tablet (0.4 mg total) under the tongue every 5 (five) minutes as needed for chest pain. 25 tablet 3   oxyCODONE -acetaminophen  (PERCOCET) 10-325 MG tablet Take 1 tablet by  mouth every 6 (six) hours as needed for pain.     pregabalin (LYRICA) 75 MG capsule Take 75 mg by mouth 2 (two) times daily.     pregabalin (LYRICA) 75 MG capsule Take 75 mg by mouth 2 (two) times daily.     rosuvastatin  (CRESTOR ) 20 MG tablet Take 1 tablet (20 mg total) by mouth daily. 90 tablet 3   rosuvastatin  (CRESTOR ) 20 MG tablet Take 20 mg by mouth at bedtime.     Semaglutide ,0.25 or 0.5MG /DOS, 2 MG/3ML SOPN Inject 0.25 mg into the skin once a week for 28 days, THEN 0.5 mg once a week. 3 mL 3   senna (SENOKOT) 8.6 MG TABS tablet Take 2 tablets (17.2 mg total) by mouth daily. 120 tablet 2   TIADYLT  ER 360 MG 24 hr capsule Take by mouth.     Vitamin D ,  Ergocalciferol , (DRISDOL) 1.25 MG (50000 UNIT) CAPS capsule Take 50,000 Units by mouth once a week.     No current facility-administered medications on file prior to visit.    Family History  Problem Relation Age of Onset   Diabetes Mother    Hypertension Mother    Stomach cancer Mother        diet at 66   Heart disease Father        diet at 37   Diabetes Sister    Hypertension Sister    Diabetes Brother    Cancer Maternal Grandmother    Cancer Paternal Grandmother     Social History   Socioeconomic History   Marital status: Single    Spouse name: Not on file   Number of children: Not on file   Years of education: Not on file   Highest education level: Not on file  Occupational History   Not on file  Tobacco Use   Smoking status: Every Day    Current packs/day: 0.50    Types: Cigarettes   Smokeless tobacco: Current   Tobacco comments:    0.5ppd  Vaping Use   Vaping status: Never Used  Substance and Sexual Activity   Alcohol use: Not Currently    Comment: social   Drug use: Never   Sexual activity: Not Currently  Other Topics Concern   Not on file  Social History Narrative   ** Merged History Encounter **       ** Merged History Encounter **       Social Drivers of Corporate investment banker Strain: Low Risk  (07/14/2022)   Overall Financial Resource Strain (CARDIA)    Difficulty of Paying Living Expenses: Not hard at all  Food Insecurity: No Food Insecurity (07/14/2022)   Hunger Vital Sign    Worried About Running Out of Food in the Last Year: Never true    Ran Out of Food in the Last Year: Never true  Transportation Needs: No Transportation Needs (07/14/2022)   PRAPARE - Administrator, Civil Service (Medical): No    Lack of Transportation (Non-Medical): No  Physical Activity: Inactive (07/14/2022)   Exercise Vital Sign    Days of Exercise per Week: 0 days    Minutes of Exercise per Session: 0 min  Stress: No Stress Concern Present  (07/14/2022)   [employer] of Occupational Health - Occupational Stress Questionnaire    Feeling of Stress : Not at all  Social Connections: Socially Isolated (07/14/2022)   Social Connection and Isolation Panel    Frequency of Communication with Friends and Family: More  than three times a week    Frequency of Social Gatherings with Friends and Family: More than three times a week    Attends Religious Services: Never    Database administrator or Organizations: No    Attends Banker Meetings: Never    Marital Status: Never married  Intimate Partner Violence: Not At Risk (07/14/2022)   Humiliation, Afraid, Rape, and Kick questionnaire    Fear of Current or Ex-Partner: No    Emotionally Abused: No    Physically Abused: No    Sexually Abused: No    Review of Systems: ROS negative except for what is noted on the assessment and plan.  Vitals:   06/14/23 1351 06/14/23 1443 06/14/23 1455  BP: (!) 158/90 (!) 148/80   Pulse: (!) 119 (!) 111 100  Temp: 99.5 F (37.5 C)    TempSrc: Oral    SpO2: 99%    Weight: 244 lb 6.4 oz (110.9 kg)    Height: 6' 1 (1.854 m)     Physical Exam: Constitutional: alert, sitting up in chair uncomfortably, in mild acute distress Cardiovascular: tachycardic  Pulmonary/Chest: normal work of breathing on room air Abdominal: hypoactive bowel sounds, soft, distended, diffuse TTP but worse in lower quadrants but pain of epigastric region as well, some guarding with palpation Neurological: alert & oriented x 3 Psych: anxious mood  Assessment & Plan:   Lower abdominal pain Reports worsening abdominal pain for past month. Describes as constant sharp pain of lower abdomen and pelvis. Denies n/v/d but has had poor po intake due to pain. Reports chills but no recorded fever. Reports abdomen pain with voiding or BM. Just ended her menstrual cycle 1-2 days ago that was about 2-3 weeks. Abdominal pain not the same as menstrual pain. No vaginal discharge  or itching, dysuria or hematuria, hematochezia. Not sexually active for months. Hx of BV in past, denies hx of STD for herself and partner. Denies constipation but has hx of it, taking Linzess with BM yesterday and passing flatus today.   Reports similar symptoms in 2019, where she was admitted for acute diffuse mesenteric inflammation with interloop and free pelvic fluid. Last OV was 5/29 for this pain, negative urine pregnancy. UA with microscopic hematuria. Had c/f for renal stone but CT renal without acute findings and no free fluid noted. No hx of abdominal surgery except for C-section.   CBG today 258. Urine dipstick with trace ketones, blood, leukocytes but no nitrites. Exam with distention and diffuse TTP with guarding.   DDX: DKA (given uncontrolled DM), colitis, appendicitis, pancreatitis, gallbladder disease, but more lower abdominal pain bilaterally and not localized; PID, endometriosis (recent menses and hx of PCOS but uterus normal on recent CT)   Discussed with patient and significant other regarding concerns. She does not want to go to ED right now, vitals are stable with mild tachycardia but afebrile, tolerating po intake still. Discussed STAT labs and CT a/p with contrast. However, discussed if worsening symptoms or new symptoms to go to ED which patient verbalizes understanding.   Plan -UA complete w reflex -CBC w diff, CMP, lipase -CT A/P w contrast  -Return precautions given to go to ED if symptoms worsen or additional symptoms develop    Patient discussed with Dr. Ofilia Benton, D.O. [facility] Internal Medicine, PGY-2 Phone: [phone] Date 06/14/2023 Time 5:28 PM

## 2023-06-14 NOTE — Patient Instructions (Addendum)
 Thank you, Ms.Shakevia R Pierce-Younger for allowing us  to provide your care today. Today we discussed   -Blood work today, I will call with results. -CT abdomen and pelvis ordered today.  -For pain: Take Tylenol  1000 mg every 6-8 hours as needed   If you have worsening pain, no bowel movements, nausea or vomiting, fevers or not able to keep food/drinks down -- GO TO THE EMERGENCY ROOM for further evaluation   Follow up: 2-3 weeks   Should you have any questions or concerns please call the internal medicine clinic at [phone].    Treysen Sudbeck, D.O. [facility]

## 2023-06-15 ENCOUNTER — Encounter: Payer: Self-pay | Admitting: Internal Medicine

## 2023-06-15 ENCOUNTER — Ambulatory Visit: Payer: Self-pay | Admitting: Student

## 2023-06-15 LAB — URINALYSIS, COMPLETE
Bilirubin, UA: NEGATIVE
Bilirubin, UA: NEGATIVE
Ketones, UA: NEGATIVE
Ketones, UA: NEGATIVE
Nitrite, UA: NEGATIVE
Nitrite, UA: NEGATIVE
Specific Gravity, UA: >=1.03 — AB (ref 1.005–1.030)
Specific Gravity, UA: >=1.03 — AB (ref 1.005–1.030)
Urobilinogen, Ur: 1 mg/dL (ref 0.2–1.0)
Urobilinogen, Ur: 1 mg/dL (ref 0.2–1.0)
pH, UA: 6 (ref 5.0–7.5)
pH, UA: 6 (ref 5.0–7.5)

## 2023-06-15 LAB — MICROSCOPIC EXAMINATION
Casts: NONE SEEN /LPF
Casts: NONE SEEN /LPF
RBC, Urine: NONE SEEN /HPF (ref 0–2)
WBC, UA: >30 /HPF — AB (ref 0–5)

## 2023-06-15 MED ORDER — FLUCONAZOLE 200 MG PO TABS: 200.0000 mg | ORAL_TABLET | Freq: Every day | ORAL | 0 refills | Status: AC

## 2023-06-15 NOTE — Addendum Note (Signed)
 Addended by: Jearldine Mina on: 06/15/2023 03:48 PM   Modules accepted: Orders

## 2023-06-15 NOTE — Addendum Note (Signed)
 Addended by: Jearldine Mina on: 06/15/2023 01:35 PM   Modules accepted: Orders

## 2023-06-15 NOTE — Addendum Note (Signed)
 Addended by: Jearldine Mina on: 06/15/2023 03:37 PM   Modules accepted: Orders

## 2023-06-18 ENCOUNTER — Telehealth: Payer: Self-pay | Admitting: *Deleted

## 2023-06-18 ENCOUNTER — Encounter: Payer: Self-pay | Admitting: Internal Medicine

## 2023-06-18 NOTE — Telephone Encounter (Signed)
 Urine Cultures were unable to be done on patient's urine from last Thursday.  Will need to return to Clinics for addition urine if doctor would like a repeat Urine.

## 2023-06-19 ENCOUNTER — Other Ambulatory Visit

## 2023-06-25 ENCOUNTER — Telehealth: Payer: Self-pay | Admitting: *Deleted

## 2023-06-25 NOTE — Telephone Encounter (Unsigned)
 Copied from CRM [id number]. Topic: Clinical - Request for Lab/Test Order >> Jun 25, 2023  8:19 AM Alfonso ORN wrote: Reason for CRM: BONNIE W FROM [facility]  Trying to schedule the patient and Need to know if a prior authorization been done for   Ct abdomen contrast  Call back [phone] direct line , if not able to answer the phone left a voice mail

## 2023-06-28 DIAGNOSIS — E669 Obesity, unspecified: Secondary | ICD-10-CM | POA: Diagnosis not present

## 2023-06-28 DIAGNOSIS — N914 Secondary oligomenorrhea: Secondary | ICD-10-CM | POA: Diagnosis not present

## 2023-06-28 DIAGNOSIS — M549 Dorsalgia, unspecified: Secondary | ICD-10-CM | POA: Diagnosis not present

## 2023-06-28 DIAGNOSIS — Z79899 Other long term (current) drug therapy: Secondary | ICD-10-CM | POA: Diagnosis not present

## 2023-06-28 DIAGNOSIS — I10 Essential (primary) hypertension: Secondary | ICD-10-CM | POA: Diagnosis not present

## 2023-06-28 DIAGNOSIS — F17210 Nicotine dependence, cigarettes, uncomplicated: Secondary | ICD-10-CM | POA: Diagnosis not present

## 2023-06-28 DIAGNOSIS — M5030 Other cervical disc degeneration, unspecified cervical region: Secondary | ICD-10-CM | POA: Diagnosis not present

## 2023-06-28 NOTE — Progress Notes (Signed)
 Internal Medicine Clinic Attending  Case discussed with the resident at the time of the visit.  We reviewed the resident's history and exam and pertinent patient test results.  I agree with the assessment, diagnosis, and plan of care documented in the resident's note.

## 2023-07-03 DIAGNOSIS — Z79899 Other long term (current) drug therapy: Secondary | ICD-10-CM | POA: Diagnosis not present

## 2023-07-04 ENCOUNTER — Ambulatory Visit
Admission: RE | Admit: 2023-07-04 | Discharge: 2023-07-04 | Disposition: A | Discharge status: Home / Self Care | Source: Ambulatory Visit | Attending: Internal Medicine | Admitting: Internal Medicine

## 2023-07-04 DIAGNOSIS — I700 Atherosclerosis of aorta: Secondary | ICD-10-CM | POA: Diagnosis not present

## 2023-07-04 DIAGNOSIS — N838 Other noninflammatory disorders of ovary, fallopian tube and broad ligament: Secondary | ICD-10-CM | POA: Diagnosis not present

## 2023-07-04 DIAGNOSIS — K5730 Diverticulosis of large intestine without perforation or abscess without bleeding: Secondary | ICD-10-CM | POA: Diagnosis not present

## 2023-07-04 DIAGNOSIS — R1030 Lower abdominal pain, unspecified: Secondary | ICD-10-CM

## 2023-07-04 MED ORDER — IOPAMIDOL (ISOVUE-300) INJECTION 61%
100.0000 mL | Freq: Once | INTRAVENOUS | Status: AC | PRN
  Administered 2023-07-04: 100 mL via INTRAVENOUS

## 2023-07-05 ENCOUNTER — Other Ambulatory Visit (HOSPITAL_COMMUNITY): Payer: Self-pay

## 2023-07-14 DIAGNOSIS — Z419 Encounter for procedure for purposes other than remedying health state, unspecified: Secondary | ICD-10-CM | POA: Diagnosis not present

## 2023-07-27 DIAGNOSIS — E559 Vitamin D deficiency, unspecified: Secondary | ICD-10-CM | POA: Diagnosis not present

## 2023-07-27 DIAGNOSIS — E669 Obesity, unspecified: Secondary | ICD-10-CM | POA: Diagnosis not present

## 2023-07-27 DIAGNOSIS — N914 Secondary oligomenorrhea: Secondary | ICD-10-CM | POA: Diagnosis not present

## 2023-07-27 DIAGNOSIS — E118 Type 2 diabetes mellitus with unspecified complications: Secondary | ICD-10-CM | POA: Diagnosis not present

## 2023-07-27 DIAGNOSIS — K5909 Other constipation: Secondary | ICD-10-CM | POA: Diagnosis not present

## 2023-07-27 DIAGNOSIS — M549 Dorsalgia, unspecified: Secondary | ICD-10-CM | POA: Diagnosis not present

## 2023-07-27 DIAGNOSIS — M5030 Other cervical disc degeneration, unspecified cervical region: Secondary | ICD-10-CM | POA: Diagnosis not present

## 2023-07-27 DIAGNOSIS — K219 Gastro-esophageal reflux disease without esophagitis: Secondary | ICD-10-CM | POA: Diagnosis not present

## 2023-07-27 DIAGNOSIS — Z79899 Other long term (current) drug therapy: Secondary | ICD-10-CM | POA: Diagnosis not present

## 2023-07-31 DIAGNOSIS — Z79899 Other long term (current) drug therapy: Secondary | ICD-10-CM | POA: Diagnosis not present

## 2023-08-08 NOTE — Telephone Encounter (Signed)
 Misty [name] (Key: [id number]) Rx #: [id number] Ozempic  (0.25 or 0.5 MG/DOSE) 2MG /3ML pen-injectors Form [employer] of [place]  Electronic Prior Authorization Request Form (2017 NCPDP) Created Sent to Plan Plan Response Submit Clinical Questions Determination Favorable Message from Plan Approved. This drug has been approved. Approved quantity: 3 units per 28 day(s). The drug has been approved from 07/25/2023 to 08/07/2024. Please call the pharmacy to process your prescription claim. Generic or biosimilar substitution may be required when available and preferred on the formulary.. Authorization Expiration Date: August 07, 2024.

## 2023-08-13 DIAGNOSIS — H5213 Myopia, bilateral: Secondary | ICD-10-CM | POA: Diagnosis not present

## 2023-08-14 DIAGNOSIS — Z419 Encounter for procedure for purposes other than remedying health state, unspecified: Secondary | ICD-10-CM | POA: Diagnosis not present

## 2023-08-14 NOTE — Addendum Note (Signed)
 Addended by: ELICIA SHARPER on: 08/14/2023 08:31 AM   Modules accepted: Orders

## 2023-08-25 DIAGNOSIS — K219 Gastro-esophageal reflux disease without esophagitis: Secondary | ICD-10-CM | POA: Diagnosis not present

## 2023-08-25 DIAGNOSIS — Z79899 Other long term (current) drug therapy: Secondary | ICD-10-CM | POA: Diagnosis not present

## 2023-08-25 DIAGNOSIS — N914 Secondary oligomenorrhea: Secondary | ICD-10-CM | POA: Diagnosis not present

## 2023-08-25 DIAGNOSIS — E559 Vitamin D deficiency, unspecified: Secondary | ICD-10-CM | POA: Diagnosis not present

## 2023-08-25 DIAGNOSIS — E669 Obesity, unspecified: Secondary | ICD-10-CM | POA: Diagnosis not present

## 2023-08-25 DIAGNOSIS — M5030 Other cervical disc degeneration, unspecified cervical region: Secondary | ICD-10-CM | POA: Diagnosis not present

## 2023-08-25 DIAGNOSIS — E118 Type 2 diabetes mellitus with unspecified complications: Secondary | ICD-10-CM | POA: Diagnosis not present

## 2023-08-25 DIAGNOSIS — Z1231 Encounter for screening mammogram for malignant neoplasm of breast: Secondary | ICD-10-CM | POA: Diagnosis not present

## 2023-08-25 DIAGNOSIS — K5909 Other constipation: Secondary | ICD-10-CM | POA: Diagnosis not present

## 2023-08-28 ENCOUNTER — Ambulatory Visit: Payer: Self-pay

## 2023-08-28 NOTE — Telephone Encounter (Signed)
 Called pt - stated she was advised by our call center to go to the [facility] on [address] since we had no available appts.

## 2023-08-28 NOTE — Telephone Encounter (Signed)
 FYI Only or Action Required?: FYI only for provider.  Patient was last seen in primary care on 06/14/2023 by Elicia Sharper, DO.  Called Nurse Triage reporting Hand Pain.  Symptoms began several weeks ago.  Interventions attempted: Prescription medications: prescription pain medication.  Symptoms are: gradually worsening.  Triage Disposition: See PCP When Office is Open (Within 3 Days)  Patient/caregiver understands and will follow disposition?: Yes. RN offered patient appt for 8/28, pt declined due to timing. RN advised [facility], patient agreeable  Copied from CRM [id number]. Topic: Clinical - Red Word Triage >> Aug 28, 2023  1:53 PM Carmell SAUNDERS wrote: Red Word that prompted transfer to Nurse Triage: Pain in both hands that are worsening. Feels a knot in left hand. May be carpal tunnel. Wants to schedule an appt    ----------------------------------------------------------------------- From previous Reason for Contact - Scheduling: Patient/patient representative is calling to schedule an appointment. Refer to attachments for appointment information. Reason for Disposition  [1] MODERATE pain (e.g., interferes with normal activities) AND [2] present > 3 days  Answer Assessment - Initial Assessment Questions 1. ONSET: When did the pain start?     Approx 2 weeks, off/on  2. LOCATION: Where is the pain located?     Both Hands, but left hand is worse.  3. PAIN: How bad is the pain? (Scale 1-10; or mild, moderate, severe)   - MILD (1-3): doesn't interfere with normal activities   - MODERATE (4-7): interferes with normal activities (e.g., work or school) or awakens from sleep   - SEVERE (8-10): excruciating pain, unable to use hand at all     7/10 pain, took some rx pain medication, has somewhat relief  4. WORK OR EXERCISE: Has there been any recent work or exercise that involved this part (i.e., hand or wrist) of the body?     No  5. CAUSE: What do you think is  causing the pain?     Concern for carpal tunnel  6. AGGRAVATING FACTORS: What makes the pain worse? (e.g., using computer)     No aggravating factors  7. OTHER SYMPTOMS: Do you have any other symptoms? (e.g., neck pain, swelling, rash, numbness, fever)     Has a nickel size knot on back of hand, intermittent numbness  8. PREGNANCY: Is there any chance you are pregnant? When was your last menstrual period?     LMP- last month  Protocols used: Hand and Wrist Pain-A-AH

## 2023-08-29 DIAGNOSIS — Z79899 Other long term (current) drug therapy: Secondary | ICD-10-CM | POA: Diagnosis not present

## 2023-08-29 NOTE — Telephone Encounter (Signed)
 Called pt - stated she went to the mobile clinic on [address]  for hand pain as instructed by our call center but no one was there. No appts today; offered pt an appt tomorrow am; unable to come in. Advised pt to go to UC; she wanted to schedule an appt for next week. Appt scheduled with Dr Isobel 09/05/23.

## 2023-09-04 ENCOUNTER — Ambulatory Visit: Admitting: *Deleted

## 2023-09-04 ENCOUNTER — Ambulatory Visit

## 2023-09-04 DIAGNOSIS — Z111 Encounter for screening for respiratory tuberculosis: Secondary | ICD-10-CM | POA: Diagnosis present

## 2023-09-04 NOTE — Progress Notes (Signed)
 Pt is aware to return Thursday; no later than Friday am to have her TB test read.

## 2023-09-05 ENCOUNTER — Encounter: Payer: Self-pay | Admitting: Physician Assistant

## 2023-09-05 ENCOUNTER — Ambulatory Visit: Admitting: Physician Assistant

## 2023-09-05 ENCOUNTER — Encounter

## 2023-09-05 VITALS — BP 150/75 | HR 105 | Ht 73.0 in | Wt 254.0 lb

## 2023-09-05 DIAGNOSIS — M79642 Pain in left hand: Secondary | ICD-10-CM

## 2023-09-05 DIAGNOSIS — I10 Essential (primary) hypertension: Secondary | ICD-10-CM

## 2023-09-05 NOTE — Progress Notes (Deleted)
   Established Patient Office Visit  Subjective   Patient ID: Misty [name], female    DOB: 02/05/1980  Age: 43 y.o. MRN: [id number]  No chief complaint on file.  Misty [name] is a 43 year old female with a past medical history of T2DM, GERD, HTN, and PCOS who presents today for an A1c follow up and diabetic foot exam. Please see problem-based assessment and plan below for additional details.      ROS    Objective:     There were no vitals taken for this visit. Physical Exam   No results found for any visits on 09/05/23.  The ASCVD Risk score (Arnett DK, et al., 2019) failed to calculate for the following reasons:   Risk score cannot be calculated because patient has a medical history suggesting prior/existing ASCVD    Assessment & Plan:   Patient seen with {IMTSattending2025/2026:[id number]}.  Type 2 Diabetes Mellitus -patient on ER Metformin  500mg  twice daily, Lantus  20units daily, Ozempic   -last A1c 12.7 in 05/2023  Hypertension  Bilateral Hand Pain Started ***. Bilateral hands feel like ***, however the left is worse. States it is a 7/10 pain and prescription pain meds have only caused some mild relief. She is concerned for carpal tunnel. She states has a nickel sized knot on the back of her *** hand with intermittent numbness.   Neven Fina, DO Internal Medicine Resident, PGY-1 [facility] 11:53 AM 09/05/2023

## 2023-09-05 NOTE — Patient Instructions (Incomplete)
 Thank you, Ms.Raevyn R Pierce-Younger for allowing us  to provide your care today. Today we discussed ***.    Referrals: -  New medications: -  I have ordered the following labs for you:  Lab Orders  No laboratory test(s) ordered today     I will call if any are abnormal. All of your labs can be accessed through My Chart.   My Chart Access: [email or web address]  Please follow-up in:    We look forward to seeing you next time. Please call our clinic at [phone] if you have any questions or concerns. The best time to call is Monday-Friday from 9am-4pm, but there is someone available 24/7. If after hours or the weekend, call the main hospital number and ask for the Internal Medicine Resident On-Call. If you need medication refills, please notify your pharmacy one week in advance and they will send us  a request.   Thank you for letting us  take part in your care. Wishing you the best!  Shuntay Everetts, DO 09/05/2023, 11:50 AM [facility]

## 2023-09-05 NOTE — Patient Instructions (Addendum)
 VISIT SUMMARY:  Today, we discussed the pain and knot on the backside of your left hand, which is affecting your daily activities. We also reviewed your blood pressure management.  YOUR PLAN:  -LEFT HAND / WRIST PAIN: A ganglion cyst is a noncancerous lump that often develops along the tendons or joints of your wrists or hands. We suspect this is causing your pain and tingling. You will be referred to orthopedics for further evaluation and management, which may include imaging and a steroid injection. In the meantime, use warm compresses for pain relief and wear a wrist brace to prevent aggravation. Continue taking your current pain medication.  -HYPERTENSION: Hypertension, or high blood pressure, can lead to serious health issues if not managed properly. Your blood pressure was elevated today, possibly due to pain. You are currently taking lisinopril . It is important to monitor your blood pressure at home regularly. Please purchase a home blood pressure monitor and follow up with internal medicine if your readings remain high.  Wrist Pain, Adult There are many things that can cause wrist pain. Some common causes include: An injury to the wrist area, such as a sprain, strain, or broken bone (fracture). Overuse of the joint. A condition that causes increased pressure on a nerve in the wrist (carpal tunnel syndrome). Wear and tear of the joints that occurs with aging (osteoarthritis). Other types of joint inflammation and stiffness (arthritis). Sometimes, the cause of wrist pain is not known. Often, the pain goes away when you follow instructions from your health care provider for relieving pain at home. This may include resting the wrist, icing the wrist, or using a splint or an elastic wrap for a short time. If your wrist pain continues, it is important to tell your provider. Follow these instructions at home: If you have a removable splint or elastic wrap: Wear the splint or wrap as told by your  provider. Remove it only as told by your provider. Ask your provider if you may remove it for bathing. Check the skin around the splint or wrap every day. Tell your provider about any concerns. Loosen the splint or wrap if your fingers tingle, become numb, or turn cold and blue. Keep the splint or wrap clean. If the splint or wrap is not waterproof: Do not let it get wet. Cover it with a watertight covering when you take a bath or shower. Managing pain, stiffness, and swelling  If told, put ice on the painful area. If you have a removable splint or wrap, remove it as told by your provider. Put ice in a plastic bag. Place a towel between your skin and the bag or between your splint or wrap and the bag. Leave the ice on for 20 minutes, 2-3 times a day. If your skin turns bright red, remove the ice right away to prevent skin damage. The risk of damage is higher if you cannot feel pain, heat, or cold. Move your fingers often to reduce stiffness and swelling. Raise (elevate) the injured area above the level of your heart while you are sitting or lying down. Activity Rest your affected wrist as told by your provider. Return to your normal activities as told by your provider. Ask your provider what activities are safe for you. Ask your provider when it is safe to drive if you have a splint or wrap on your wrist. Do exercises as told by your provider. General instructions Pay attention to any changes in your symptoms. Take over-the-counter and prescription medicines  only as told by your provider. Contact a health care provider if: You have a sudden, sharp pain in the wrist, hand, or arm that is different or new. Any swelling or bruising on your wrist or hand gets worse. Your skin becomes red, has a rash, or has open sores. Your pain does not get better or it gets worse. You have a fever or chills. Get help right away if: You lose feeling in your fingers or hand. Your fingers turn white, very  red, or cold and blue. You cannot move your fingers. This information is not intended to replace advice given to you by your health care provider. Make sure you discuss any questions you have with your health care provider. Document Revised: 09/23/2021 Document Reviewed: 09/23/2021 Elsevier Patient Education  2024 [employer].

## 2023-09-05 NOTE — Progress Notes (Signed)
 New Patient Office Visit  Subjective    Patient ID: Misty [name], female    DOB: 1980/11/20  Age: 43 y.o. MRN: [id number]  CC:  Chief Complaint  Patient presents with   Hand Pain    Patient noticed a knot on the back side of left hand several weeks ago. Left hand has been very sore and at times radiates to elbow.    Discussed the use of AI scribe software for clinical note transcription with the patient, who gave verbal consent to proceed.  History of Present Illness   Misty [name] is a 43 year old female with neuropathy and arthritis who presents with left hand pain and a knot on the backside of her hand.  She experiences pain in her left hand with a noticeable knot on the backside a couple of weeks ago. The knot has not increased in size. Pain occasionally radiates to her elbow and causes tingling in two fingers (ring and pinky), sometimes feeling like her hand is on fire. She does not recall any specific injury. Being left-handed, the pain impacts her daily activities.  She has neuropathy in her feet and arthritis from her neck to her feet. She takes pain medication, which provides some relief, and manages the pain by rubbing the affected area.  She is on lisinopril  for blood pressure, which she does not monitor at home.   Outpatient Encounter Medications as of 09/05/2023  Medication Sig   Accu-Chek FastClix Lancets MISC Use twice per day to check your blood glucose.  E11.65   acetaminophen  (TYLENOL ) 500 MG tablet Take 1,500 mg by mouth daily.   aspirin  EC 81 MG tablet Take 1 tablet (81 mg total) by mouth daily. Swallow whole.   Blood Glucose Monitoring Suppl (ACCU-CHEK GUIDE) w/Device KIT Use to check blood glucose.   cephALEXin  (KEFLEX ) 500 MG capsule Take 1 capsule (500 mg total) by mouth 2 (two) times daily.   Continuous Glucose Sensor (FREESTYLE LIBRE 3 SENSOR) MISC Place 1 sensor on the skin every 14 days. Use to check glucose continuously    diltiazem  (CARDIZEM  CD) 360 MG 24 hr capsule Take 1 capsule (360 mg total) by mouth daily.   Ergocalciferol  (VITAMIN D2) 10 MCG (400 UNIT) TABS Take 2 tablets by mouth daily before breakfast.   glucose blood (ACCU-CHEK GUIDE) test strip Use one strip, twice daily to check your blood glucose.  E11.65   insulin  glargine (LANTUS  SOLOSTAR) 100 UNIT/ML Solostar Pen Inject 20 Units into the skin daily. 10 units in the am, and 10 units at night.   isosorbide  mononitrate (IMDUR ) 30 MG 24 hr tablet Take 30 mg by mouth daily.   LINZESS 72 MCG capsule Take 72 mcg by mouth daily.   lisinopril  (ZESTRIL ) 10 MG tablet Take 1 tablet (10 mg total) by mouth daily.   lisinopril -hydrochlorothiazide  (ZESTORETIC ) 10-12.5 MG tablet Take 1 tablet by mouth daily.   metFORMIN  (GLUCOPHAGE -XR) 500 MG 24 hr tablet Take 1 tablet (500 mg total) by mouth daily with breakfast.   nitroGLYCERIN  (NITRODUR - DOSED IN MG/24 HR) 0.6 mg/hr patch Place 1 patch (0.6 mg total) onto the skin daily.   oxyCODONE -acetaminophen  (PERCOCET) 10-325 MG tablet Take 1 tablet by mouth every 6 (six) hours as needed for pain.   pregabalin (LYRICA) 75 MG capsule Take 75 mg by mouth 2 (two) times daily.   pregabalin (LYRICA) 75 MG capsule Take 75 mg by mouth 2 (two) times daily.   rosuvastatin  (CRESTOR ) 20 MG tablet Take  20 mg by mouth at bedtime.   Semaglutide ,0.25 or 0.5MG /DOS, 2 MG/3ML SOPN Inject 0.25 mg into the skin once a week for 28 days, THEN 0.5 mg once a week.   senna (SENOKOT) 8.6 MG TABS tablet Take 2 tablets (17.2 mg total) by mouth daily.   TIADYLT  ER 360 MG 24 hr capsule Take by mouth.   Vitamin D , Ergocalciferol , (DRISDOL) 1.25 MG (50000 UNIT) CAPS capsule Take 50,000 Units by mouth once a week.   isosorbide  mononitrate (IMDUR ) 60 MG 24 hr tablet Take 1 tablet (60 mg total) by mouth daily. (Patient not taking: Reported on 09/05/2023)   nitroGLYCERIN  (NITROSTAT ) 0.4 MG SL tablet Place 1 tablet (0.4 mg total) under the tongue every 5 (five)  minutes as needed for chest pain. (Patient not taking: Reported on 09/05/2023)   rosuvastatin  (CRESTOR ) 20 MG tablet Take 1 tablet (20 mg total) by mouth daily. (Patient not taking: Reported on 09/05/2023)   No facility-administered encounter medications on file as of 09/05/2023.    Past Medical History:  Diagnosis Date   Chest pain    multiple ED evaluations, most likely musculoskeletal (cervical radiculopathy), needs MRI spine   Diabetes mellitus without complication (HCC)    Diverticulitis 2019   GERD (gastroesophageal reflux disease)    Gestational diabetes    Hidradenitis suppurativa 01/18/2018   History of diverticulitis 10/03/2017   Hypertension    NSTEMI (non-ST elevated myocardial infarction) (HCC) 06/18/2022   PCOD (polycystic ovarian disease)    Followed by [facility], has been on metformin    PCOS (polycystic ovarian syndrome)    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    Past Surgical History:  Procedure Laterality Date   CESAREAN SECTION  12/14/2011   Procedure: CESAREAN SECTION;  Surgeon: Olam Mill, MD;  Location: [facility] ORS;  Service: Obstetrics;  Laterality: N/A;  Primary Cesarean Section Delivery Girl @ [phone], Apgars   LEFT HEART CATH AND CORONARY ANGIOGRAPHY N/A 06/19/2022   Procedure: LEFT HEART CATH AND CORONARY ANGIOGRAPHY;  Surgeon: Anner Alm ORN, MD;  Location: [facility] INVASIVE CV LAB;  Service: Cardiovascular;  Laterality: N/A;    Family History  Problem Relation Age of Onset   Diabetes Mother    Hypertension Mother    Stomach cancer Mother        diet at 15   Heart disease Father        diet at 38   Diabetes Sister    Hypertension Sister    Diabetes Brother    Cancer Maternal Grandmother    Cancer Paternal Grandmother     Social History   Socioeconomic History   Marital status: Single    Spouse name: Not on file   Number of children: Not on file   Years of education: Not on file   Highest education level: Not on file  Occupational History   Not on file   Tobacco Use   Smoking status: Every Day    Current packs/day: 0.50    Types: Cigarettes   Smokeless tobacco: Current   Tobacco comments:    0.5ppd  Vaping Use   Vaping status: Never Used  Substance and Sexual Activity   Alcohol use: Not Currently    Comment: social   Drug use: Never   Sexual activity: Not Currently  Other Topics Concern   Not on file  Social History Narrative   ** Merged History Encounter **       ** Merged History Encounter **       Social Drivers of  Health   Financial Resource Strain: Low Risk  (07/14/2022)   Overall Financial Resource Strain (CARDIA)    Difficulty of Paying Living Expenses: Not hard at all  Food Insecurity: No Food Insecurity (07/14/2022)   Hunger Vital Sign    Worried About Running Out of Food in the Last Year: Never true    Ran Out of Food in the Last Year: Never true  Transportation Needs: No Transportation Needs (07/14/2022)   PRAPARE - Administrator, Civil Service (Medical): No    Lack of Transportation (Non-Medical): No  Physical Activity: Inactive (07/14/2022)   Exercise Vital Sign    Days of Exercise per Week: 0 days    Minutes of Exercise per Session: 0 min  Stress: No Stress Concern Present (07/14/2022)   [employer] of Occupational Health - Occupational Stress Questionnaire    Feeling of Stress : Not at all  Social Connections: Socially Isolated (07/14/2022)   Social Connection and Isolation Panel    Frequency of Communication with Friends and Family: More than three times a week    Frequency of Social Gatherings with Friends and Family: More than three times a week    Attends Religious Services: Never    Database administrator or Organizations: No    Attends Banker Meetings: Never    Marital Status: Never married  Intimate Partner Violence: Not At Risk (07/14/2022)   Humiliation, Afraid, Rape, and Kick questionnaire    Fear of Current or Ex-Partner: No    Emotionally Abused: No     Physically Abused: No    Sexually Abused: No    Review of Systems  Constitutional: Negative.   HENT: Negative.    Eyes: Negative.   Respiratory:  Negative for shortness of breath.   Cardiovascular:  Negative for chest pain.  Gastrointestinal: Negative.   Genitourinary: Negative.   Musculoskeletal: Negative.   Skin: Negative.   Neurological: Negative.   Endo/Heme/Allergies: Negative.   Psychiatric/Behavioral: Negative.          Objective    BP (!) 150/75 (BP Location: Left Arm, Patient Position: Sitting, Cuff Size: Large)   Pulse (!) 105   Ht 6' 1 (1.854 m)   Wt 254 lb (115.2 kg)   LMP 07/29/2023   SpO2 97%   BMI 33.51 kg/m   Physical Exam Vitals and nursing note reviewed.  Constitutional:      Appearance: Normal appearance.  HENT:     Head: Normocephalic and atraumatic.     Right Ear: External ear normal.     Left Ear: External ear normal.     Nose: Nose normal.     Mouth/Throat:     Mouth: Mucous membranes are moist.     Pharynx: Oropharynx is clear.  Cardiovascular:     Rate and Rhythm: Normal rate and regular rhythm.     Pulses: Normal pulses.     Heart sounds: Normal heart sounds.  Pulmonary:     Effort: Pulmonary effort is normal.     Breath sounds: Normal breath sounds.  Musculoskeletal:        General: Normal range of motion.     Left hand: Tenderness present. No swelling. Normal range of motion.       Arms:     Cervical back: Normal range of motion and neck supple.     Comments: Difficult to appreciate knot that patient was describing was present.  Tender to palpation.   Skin:    General: Skin is  warm and dry.  Neurological:     General: No focal deficit present.     Mental Status: She is alert and oriented to person, place, and time.  Psychiatric:        Mood and Affect: Mood normal.        Behavior: Behavior normal.        Thought Content: Thought content normal.        Judgment: Judgment normal.         Assessment & Plan:    Problem List Items Addressed This Visit   None Visit Diagnoses       Left hand pain    -  Primary   Relevant Orders   Ambulatory referral to Orthopedic Surgery     Elevated blood pressure reading in office with diagnosis of hypertension          1. Left hand pain (Primary) Patient education given on supportive care.   - Ambulatory referral to Orthopedic Surgery  2. Elevated blood pressure reading in office with diagnosis of hypertension Patient encouraged to check blood pressure at home, keep a written log and have available for all office visits.  Patient encouraged to return to mobile unit or follow-up with primary care provider if readings remain elevated.  Patient understands and agrees.  Red flags given for prompt reevaluation   I have reviewed the patient's medical history (PMH, PSH, Social History, Family History, Medications, and allergies) , and have been updated if relevant. I spent 20 minutes reviewing chart and  face to face time with patient.   Return if symptoms worsen or fail to improve.   Kirk RAMAN Mayers, PA-C

## 2023-09-06 ENCOUNTER — Ambulatory Visit: Admitting: *Deleted

## 2023-09-06 DIAGNOSIS — Z111 Encounter for screening for respiratory tuberculosis: Secondary | ICD-10-CM

## 2023-09-06 LAB — TB SKIN TEST
Induration: 0 mm
TB Skin Test: NEGATIVE

## 2023-09-12 ENCOUNTER — Other Ambulatory Visit: Payer: Self-pay | Admitting: Physician Assistant

## 2023-09-13 ENCOUNTER — Other Ambulatory Visit: Payer: Self-pay | Admitting: Physician Assistant

## 2023-09-14 DIAGNOSIS — Z419 Encounter for procedure for purposes other than remedying health state, unspecified: Secondary | ICD-10-CM | POA: Diagnosis not present

## 2023-09-18 DIAGNOSIS — E118 Type 2 diabetes mellitus with unspecified complications: Secondary | ICD-10-CM | POA: Diagnosis not present

## 2023-09-18 DIAGNOSIS — E559 Vitamin D deficiency, unspecified: Secondary | ICD-10-CM | POA: Diagnosis not present

## 2023-09-18 DIAGNOSIS — M549 Dorsalgia, unspecified: Secondary | ICD-10-CM | POA: Diagnosis not present

## 2023-09-18 DIAGNOSIS — K5909 Other constipation: Secondary | ICD-10-CM | POA: Diagnosis not present

## 2023-09-18 DIAGNOSIS — E669 Obesity, unspecified: Secondary | ICD-10-CM | POA: Diagnosis not present

## 2023-09-18 DIAGNOSIS — N914 Secondary oligomenorrhea: Secondary | ICD-10-CM | POA: Diagnosis not present

## 2023-09-18 DIAGNOSIS — M129 Arthropathy, unspecified: Secondary | ICD-10-CM | POA: Diagnosis not present

## 2023-09-18 DIAGNOSIS — K219 Gastro-esophageal reflux disease without esophagitis: Secondary | ICD-10-CM | POA: Diagnosis not present

## 2023-09-18 DIAGNOSIS — Z79899 Other long term (current) drug therapy: Secondary | ICD-10-CM | POA: Diagnosis not present

## 2023-09-18 DIAGNOSIS — M5030 Other cervical disc degeneration, unspecified cervical region: Secondary | ICD-10-CM | POA: Diagnosis not present

## 2023-09-24 DIAGNOSIS — Z79899 Other long term (current) drug therapy: Secondary | ICD-10-CM | POA: Diagnosis not present

## 2023-09-25 ENCOUNTER — Ambulatory Visit: Admitting: Orthopedic Surgery

## 2023-10-11 ENCOUNTER — Emergency Department (HOSPITAL_COMMUNITY)
Admission: EM | Admit: 2023-10-11 | Discharge: 2023-10-12 | Discharge status: Against Medical Advice | Attending: Emergency Medicine | Admitting: Emergency Medicine

## 2023-10-11 ENCOUNTER — Encounter: Payer: Self-pay | Admitting: Cardiology

## 2023-10-11 ENCOUNTER — Other Ambulatory Visit: Payer: Self-pay

## 2023-10-11 ENCOUNTER — Encounter (HOSPITAL_COMMUNITY): Payer: Self-pay | Admitting: Emergency Medicine

## 2023-10-11 ENCOUNTER — Emergency Department (HOSPITAL_COMMUNITY)

## 2023-10-11 DIAGNOSIS — Z5321 Procedure and treatment not carried out due to patient leaving prior to being seen by health care provider: Secondary | ICD-10-CM | POA: Diagnosis not present

## 2023-10-11 DIAGNOSIS — R079 Chest pain, unspecified: Secondary | ICD-10-CM | POA: Insufficient documentation

## 2023-10-11 DIAGNOSIS — R202 Paresthesia of skin: Secondary | ICD-10-CM | POA: Diagnosis not present

## 2023-10-11 LAB — CBC
HCT: 39.5 % (ref 36.0–46.0)
Hemoglobin: 12.6 g/dL (ref 12.0–15.0)
MCH: 26.9 pg (ref 26.0–34.0)
MCHC: 31.9 g/dL (ref 30.0–36.0)
MCV: 84.4 fL (ref 80.0–100.0)
Platelets: 216 K/uL (ref 150–400)
RBC: 4.68 MIL/uL (ref 3.87–5.11)
RDW: 12.9 % (ref 11.5–15.5)
WBC: 7.6 K/uL (ref 4.0–10.5)
nRBC: 0 % (ref 0.0–0.2)

## 2023-10-11 LAB — HCG, SERUM, QUALITATIVE: Preg, Serum: NEGATIVE

## 2023-10-11 LAB — BASIC METABOLIC PANEL WITH GFR
Anion gap: 10 (ref 5–15)
BUN: 9 mg/dL (ref 6–20)
CO2: 24 mmol/L (ref 22–32)
Calcium: 9.1 mg/dL (ref 8.9–10.3)
Chloride: 102 mmol/L (ref 98–111)
Creatinine, Ser: 0.61 mg/dL (ref 0.44–1.00)
GFR, Estimated: >60 mL/min (ref >60)
Glucose, Bld: 298 mg/dL — ABNORMAL HIGH (ref 70–99)
Potassium: 3.7 mmol/L (ref 3.5–5.1)
Sodium: 136 mmol/L (ref 135–145)

## 2023-10-11 LAB — TROPONIN I (HIGH SENSITIVITY)
Troponin I (High Sensitivity): 5 ng/L (ref <18)
Troponin I (High Sensitivity): 5 ng/L (ref <18)

## 2023-10-11 NOTE — ED Triage Notes (Signed)
 Present POV with chest pain radiating down right arm and right hand tingling. Pain started this morning.

## 2023-10-11 NOTE — Telephone Encounter (Signed)
 Patient reports chest pressure on left side of chest. She reports this started at 7am, persistent all day. She also reports her heart is racing. She is out of NTG.   Advised patient that she NOT send urgent chest pain messages via MyChart. Advised that since we cannot manage acute chest pain in the office, she needs to go to ED for evaluation.

## 2023-10-16 DIAGNOSIS — E118 Type 2 diabetes mellitus with unspecified complications: Secondary | ICD-10-CM | POA: Diagnosis not present

## 2023-10-16 DIAGNOSIS — Z79899 Other long term (current) drug therapy: Secondary | ICD-10-CM | POA: Diagnosis not present

## 2023-10-16 DIAGNOSIS — E669 Obesity, unspecified: Secondary | ICD-10-CM | POA: Diagnosis not present

## 2023-10-16 DIAGNOSIS — N914 Secondary oligomenorrhea: Secondary | ICD-10-CM | POA: Diagnosis not present

## 2023-10-16 DIAGNOSIS — G8929 Other chronic pain: Secondary | ICD-10-CM | POA: Diagnosis not present

## 2023-10-16 DIAGNOSIS — M5030 Other cervical disc degeneration, unspecified cervical region: Secondary | ICD-10-CM | POA: Diagnosis not present

## 2023-10-16 DIAGNOSIS — R0789 Other chest pain: Secondary | ICD-10-CM | POA: Diagnosis not present

## 2023-10-16 DIAGNOSIS — Z23 Encounter for immunization: Secondary | ICD-10-CM | POA: Diagnosis not present

## 2023-10-16 DIAGNOSIS — M549 Dorsalgia, unspecified: Secondary | ICD-10-CM | POA: Diagnosis not present

## 2023-10-16 DIAGNOSIS — K219 Gastro-esophageal reflux disease without esophagitis: Secondary | ICD-10-CM | POA: Diagnosis not present

## 2023-10-16 DIAGNOSIS — E559 Vitamin D deficiency, unspecified: Secondary | ICD-10-CM | POA: Diagnosis not present

## 2023-10-18 DIAGNOSIS — Z79899 Other long term (current) drug therapy: Secondary | ICD-10-CM | POA: Diagnosis not present

## 2023-10-19 ENCOUNTER — Ambulatory Visit

## 2023-10-19 VITALS — BP 152/88 | HR 125 | Temp 98.1°F | Ht 73.0 in | Wt 246.8 lb

## 2023-10-19 DIAGNOSIS — Z79899 Other long term (current) drug therapy: Secondary | ICD-10-CM | POA: Diagnosis not present

## 2023-10-19 DIAGNOSIS — I10 Essential (primary) hypertension: Secondary | ICD-10-CM | POA: Diagnosis not present

## 2023-10-19 DIAGNOSIS — Z8249 Family history of ischemic heart disease and other diseases of the circulatory system: Secondary | ICD-10-CM

## 2023-10-19 DIAGNOSIS — I2081 Angina pectoris with coronary microvascular dysfunction: Secondary | ICD-10-CM

## 2023-10-19 DIAGNOSIS — Z794 Long term (current) use of insulin: Secondary | ICD-10-CM | POA: Diagnosis not present

## 2023-10-19 DIAGNOSIS — K219 Gastro-esophageal reflux disease without esophagitis: Secondary | ICD-10-CM | POA: Diagnosis not present

## 2023-10-19 DIAGNOSIS — F17210 Nicotine dependence, cigarettes, uncomplicated: Secondary | ICD-10-CM | POA: Diagnosis not present

## 2023-10-19 DIAGNOSIS — E1140 Type 2 diabetes mellitus with diabetic neuropathy, unspecified: Secondary | ICD-10-CM

## 2023-10-19 DIAGNOSIS — R079 Chest pain, unspecified: Secondary | ICD-10-CM

## 2023-10-19 DIAGNOSIS — Z833 Family history of diabetes mellitus: Secondary | ICD-10-CM

## 2023-10-19 LAB — POCT GLYCOSYLATED HEMOGLOBIN (HGB A1C): HbA1c, POC (controlled diabetic range): 10.7 % — AB (ref 0.0–7.0)

## 2023-10-19 LAB — GLUCOSE, CAPILLARY: Glucose-Capillary: 386 mg/dL — ABNORMAL HIGH (ref 70–99)

## 2023-10-19 MED ORDER — FAMOTIDINE 20 MG PO TABS: 20.0000 mg | ORAL_TABLET | Freq: Two times a day (BID) | ORAL | 2 refills | Status: AC | PRN

## 2023-10-19 MED ORDER — LISINOPRIL 10 MG PO TABS: 10.0000 mg | ORAL_TABLET | Freq: Every day | ORAL | 3 refills | Status: AC

## 2023-10-19 MED ORDER — TIADYLT ER 360 MG PO CP24: 360.0000 mg | ORAL_CAPSULE | Freq: Every day | ORAL | 2 refills | Status: AC

## 2023-10-19 NOTE — Progress Notes (Signed)
 Established Patient Office Visit  Subjective   Patient ID: Misty [name], female    DOB: [birth year]  Age: 43 y.o. MRN: [id number]  Chief Complaint  Patient presents with   Tachycardia   Chest Pain    Active chest pain, pain 7/10   Mass    Lump on left hand, hand pain radiating to fingers and elbows    HPI Patient is a 43 year old female with PMH of Hypertension, morbid obesity, tobacco abuse, diabetes mellitus with neuropathy that presents today for follow up but also has the acute complaint of chest pain. See problem based assessment for more details.    ROS See problem based assessment for more details    Objective:     BP (!) 152/88 (BP Location: Left Arm, Patient Position: Sitting, Cuff Size: Normal)   Pulse (!) 125   Temp 98.1 F (36.7 C) (Oral)   Ht 6' 1 (1.854 m)   Wt 246 lb 12.8 oz (111.9 kg)   LMP 10/05/2023 (Approximate)   SpO2 98%   BMI 32.56 kg/m  BP Readings from Last 3 Encounters:  10/19/23 (!) 152/88  10/11/23 (!) 150/84  09/05/23 (!) 150/75   Wt Readings from Last 3 Encounters:  10/19/23 246 lb 12.8 oz (111.9 kg)  10/11/23 253 lb 8.5 oz (115 kg)  09/05/23 254 lb (115.2 kg)      Physical Exam Constitution: Alert and in no acute distress Heart: Regular rhythm ,tachycardic, no murmurs heard Lungs: Respiratory effort normal,  lungs clear to auscultation Chest: pain not reproducible on palpation of chest Lower extremities: no lower extremity edema bilaterally  Abdomen: no tenderness to palpation in all four quadrants   Results for orders placed or performed in visit on 10/19/23  Glucose, capillary  Result Value Ref Range   Glucose-Capillary 386 (H) 70 - 99 mg/dL  POC Hbg J8R  Result Value Ref Range   Hemoglobin A1C     HbA1c POC (<> result, manual entry)     HbA1c, POC (prediabetic range)     HbA1c, POC (controlled diabetic range) 10.7 (A) 0.0 - 7.0 %      The ASCVD Risk score (Arnett DK, et al., 2019) failed to calculate  for the following reasons:   Risk score cannot be calculated because patient has a medical history suggesting prior/existing ASCVD    Assessment & Plan:   Problem List Items Addressed This Visit     GERD (gastroesophageal reflux disease)   Patient states that she does get some occasional burning, in her chest that could be reflux. Is not on anything currently for GERD but has been in the past  Plan: Will start patient on pepcid  20 mg twice a day       Relevant Medications   famotidine  (PEPCID ) 20 MG tablet   Essential hypertension (Chronic)   Patient is unsure of what she is on.   Have refilled Lisinopril  10 mg at this time and diltiazem  360 at this time since patient states that she is on these. Patient had not taken any medications today and BP was 152/88  Plan: Patient will bring medications in at next visit and we can titrate as needed.       Relevant Medications   TIADYLT  ER 360 MG 24 hr capsule   lisinopril  (ZESTRIL ) 10 MG tablet   Diabetes mellitus with neuropathy (HCC) - Primary (Chronic)   Patient's A1c today is 10.7. States that she has not been on ozempic  .25 mg for  three weeks due to GI side effects. She states that she would be willing to try it again and did talk to her about eating smaller meals and adding fiber to see if this helps  Plan:  If she is still having GI side effects, can consider switching to mounjaro for less GI side effect profile. 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  Troponins at that time were within normal limits and EKG did not show any acute ischemic changes.  She did have a heart cath last year in June that showed clean coronary vessels  but suggested coronary vasospasm could be the cause of chest pain.  Patient is still currently smoking about a pack per day day discussed with him that quitting smoking would overall be helpful.  Today patient presents again with chest pain that has stayed the same since then.  States that this pain is 7 out of 10 and it is worse chest pain and then she has had last year when she had a heart cath.  Patient has also endorsed some palpitations.  She states that the pain radiates to her right arm and causes some tingling in right arm. Pain is not reproducible on exam.  Upon further questioning patient denies consistent reflux symptoms but states that she does get burning occasionally and this chest pain could be related to indigestion/reflux.  EKG today showed sinus tachycardia, LVH and some T wave variations but no ischemic changes.Believe that this is likely coronary vasospasm and reassured by EKG   Plan: Patient needs to follow up with cardiology She is on a lot of medicines and did attempt to clean up med list today, but patient was not sure of all medications. Told patient to bring in ALL meds at next visit  Did let patient know that cardiology had refilled IMDUR  30 mg so she will go pick this up.  Ultimately conversation should be had about quitting smoking at another visit. Did start discussion today with patient       Relevant Medications   TIADYLT  ER 360 MG 24 hr capsule   lisinopril  (ZESTRIL ) 10 MG tablet   Other Visit Diagnoses       Chest pain, unspecified type       Relevant Orders   EKG 12-Lead (Completed)       Return in about 2 weeks (around 11/02/2023).    [name], DO Patient seen with Dr.Winfrey

## 2023-10-19 NOTE — Assessment & Plan Note (Signed)
 Patient is unsure of what she is on.   Have refilled Lisinopril  10 mg at this time and diltiazem  360 at this time since patient states that she is on these. Patient had not taken any medications today and BP was 152/88  Plan: Patient will bring medications in at next visit and we can titrate as needed.

## 2023-10-19 NOTE — Assessment & Plan Note (Signed)
 Patient's A1c today is 10.7. States that she has not been on ozempic  .25 mg for three weeks due to GI side effects. She states that she would be willing to try it again and did talk to her about eating smaller meals and adding fiber to see if this helps  Plan:  If she is still having GI side effects, can consider switching to mounjaro for less GI side effect profile. Patient does take linzess for IBS?This could be making side effects worse

## 2023-10-19 NOTE — Assessment & Plan Note (Signed)
 Patient states that she does get some occasional burning, in her chest that could be reflux. Is not on anything currently for GERD but has been in the past  Plan: Will start patient on pepcid  20 mg twice a day

## 2023-10-19 NOTE — Assessment & Plan Note (Addendum)
 Patient has had chest pain since last week and went to the ED on 10/29, was not seen.  Troponins at that time were within normal limits and EKG did not show any acute ischemic changes.  She did have a heart cath last year in June that showed clean coronary vessels but suggested coronary vasospasm could be the cause of chest pain.  Patient is still currently smoking about a pack per day day discussed with him that quitting smoking would overall be helpful.  Today patient presents again with chest pain that has stayed the same since then.  States that this pain is 7 out of 10 and it is worse chest pain and then she has had last year when she had a heart cath.  Patient has also endorsed some palpitations.  She states that the pain radiates to her right arm and causes some tingling in right arm. Pain is not reproducible on exam.  Upon further questioning patient denies consistent reflux symptoms but states that she does get burning occasionally and this chest pain could be related to indigestion/reflux.  EKG today showed sinus tachycardia, LVH and some T wave variations but no ischemic changes.Believe that this is likely coronary vasospasm and reassured by EKG   Plan: Patient needs to follow up with cardiology She is on a lot of medicines and did attempt to clean up med list today, but patient was not sure of all medications. Told patient to bring in ALL meds at next visit  Did let patient know that cardiology had refilled IMDUR  30 mg so she will go pick this up.  Ultimately conversation should be had about quitting smoking at another visit. Did start discussion today with patient

## 2023-10-19 NOTE — Patient Instructions (Signed)
 Today we discussed the following medical conditions and plan:   Please bring in your medications next week so we can see what you are on  We will recheck your blood pressure at that time and talk to you about your diabetes.   We look forward to seeing you next time. Please call our clinic at [phone] if you have any questions or concerns. The best time to call is Monday-Friday from 9am-4pm, but there is someone available 24/7. If you need medication refills, please notify your pharmacy one week in advance and they will send us  a request.   Thank you for trusting me with your care. Wishing you the best!   Jerelene Salaam D'Mello, DO  [facility]

## 2023-10-22 ENCOUNTER — Other Ambulatory Visit (INDEPENDENT_AMBULATORY_CARE_PROVIDER_SITE_OTHER): Payer: Self-pay

## 2023-10-22 ENCOUNTER — Other Ambulatory Visit: Payer: Self-pay

## 2023-10-22 ENCOUNTER — Ambulatory Visit: Admitting: Orthopedic Surgery

## 2023-10-22 DIAGNOSIS — M79602 Pain in left arm: Secondary | ICD-10-CM

## 2023-10-22 DIAGNOSIS — R200 Anesthesia of skin: Secondary | ICD-10-CM

## 2023-10-22 DIAGNOSIS — R202 Paresthesia of skin: Secondary | ICD-10-CM | POA: Diagnosis not present

## 2023-10-22 NOTE — Progress Notes (Signed)
 Internal Medicine Clinic Attending  I was physically present during the key portions of the resident provided service and participated in the medical decision making of patient's management care. I reviewed pertinent patient test results.  The assessment, diagnosis, and plan were formulated together and I agree with the documentation in the resident's note.  Francesco Elsie NOVAK, MD

## 2023-10-22 NOTE — Progress Notes (Signed)
 Misty [name] - 43 y.o. female MRN [id number]  Date of birth: 02/24/1980  Office Visit Note: Visit Date: 10/22/2023 PCP: Norrine Sharper, MD Referred by: Mayers, Cari S, PA-C  Subjective: No chief complaint on file.  HPI: Misty [name] is a pleasant 43 y.o. female who presents today for evaluation of numbness and tingling in the ulnar aspect of the left hand, also describing a dorsal mass at the wrist level.  Numbness and tingling has been present now for multiple months, worsening in nature.  She also has associated nocturnal symptoms.  The dorsal wrist mass has been present now for roughly 1 month, she is unsure of any previous injury or trauma.  States that the mass has grown slightly in size.  Has ongoing pain with wrist loading and heavy lifting.  Pertinent ROS were reviewed with the patient and found to be negative unless otherwise specified above in HPI.   Visit Reason: left hand/ elbow Duration of symptoms: 1 month Hand dominance: left Occupation: Conservation officer, [employer] Diabetic: Yes/ 10.7 Smoking: Yes Heart/Lung History: yes Blood Thinners:  baby aspirin   Prior Testing/EMG: none Injections (Date): none Treatments: none Prior Surgery: none    Assessment & Plan: Visit Diagnoses:  1. Left arm pain   2. Numbness and tingling of left hand     Plan: Based upon her clinical examination today, she appears to be demonstrating signs symptoms consistent with left-sided cubital tunnel syndrome.  She is also demonstrating signs symptoms consistent with a dorsal wrist ganglion cyst.  We discussed the underlying etiology and pathophysiology of cubital tunnel syndrome as well as treat modalities ranging from conservative to surgical.  I recommended that she undergo electrodiagnostic testing of the left upper extremity in order to better delineate potential nerve compression in order to help guide treatment modalities moving forward.  As for the left wrist dorsal mass,  this appears to be a ganglion cyst based on examination today.  We once again discussed the underlying etiology pathophysiology of this condition as well, we discussed treatment allergies range from conservative to surgical.  For the time being, she will continue with observation until the electrodiagnostic study is completed left upper extremity.  I did explain that should we need to perform a nerve decompression procedure, we could also perform cyst excision at that time should symptoms remain refractory to conservative care.  We discussed at length her underlying diabetic control as well, her A1c is quite elevated most recently.  She stated that she has adjusted her medications with her PCP recently in order to better control her glucose.  We also discussed the underlying smoking, as well as the risks and benefits of surgical intervention.  I mentioned that smoking cessation will help limit complications postoperatively should we need to go that route.  She expressed understanding.  I spent 45 minutes in the care of this patient today including review of previous documentation, imaging obtained, face-to-face time discussing all options regarding treatment and documenting the encounter.   Follow-up: No follow-ups on file.   Meds & Orders: No orders of the defined types were placed in this encounter.   Orders Placed This Encounter  Procedures   XR Wrist Complete Left   XR Elbow 2 Views Left   Ambulatory referral to Physical Medicine Rehab     Procedures: No procedures performed      Clinical History: No specialty comments available.  She reports that she has been smoking cigarettes. She uses smokeless tobacco.  Recent Labs  01/31/23 1128 05/31/23 1136 10/19/23 1051  HGBA1C 10.3* 12.7* 10.7*    Objective:   Vital Signs: LMP 10/05/2023 (Approximate)   Physical Exam  Gen: Well-appearing, in no acute distress; non-toxic CV: Regular Rate. Well-perfused. Warm.  Resp: Breathing  unlabored on room air; no wheezing. Psych: Fluid speech in conversation; appropriate affect; normal thought process  Ortho Exam PHYSICAL EXAM:  General: Patient is well appearing and in no distress.   Skin and Muscle: No skin changes are apparent to upper extremities.   Range of Motion and Palpation Tests: Mobility is full about the elbows with flexion and extension.  No evidence of nerve subluxation at left elbow.  Forearm supination and pronation are 85/85 bilaterally.  Wrist flexion/extension is 75/65 bilaterally.  Digital flexion and extension are full.  Thumb opposition is full to the base of the small fingers bilaterally.    Palpable mass over the dorsal aspect of the left wrist, midline at the wrist crease, is approximately 1.5 cm x 2 cm, soft, compressible, mobile   Neurologic, Vascular, Motor: Sensation is diminished to light touch in the left ulnar nerve distribution.    Tinel sign cubital tunnel: Positive left Scratch collapse elbow: Positive left  Motor left side SF FDP: 5/5 Froment: Negative Wartenberg: Negative FDI wasting: Negative  Fingers pink and well perfused.  Capillary refill is brisk.     Lab Results  Component Value Date   HGBA1C 10.7 (A) 10/19/2023      Imaging: XR Elbow 2 Views Left Result Date: 10/22/2023 There is no evidence of fracture or dislocation. There is no evidence of arthropathy or other focal bone abnormality. Soft tissues are unremarkable.   XR Wrist Complete Left Result Date: 10/22/2023 There is no evidence of fracture or dislocation. There is no evidence of arthropathy or other focal bone abnormality. Soft tissues are unremarkable.    Past Medical/Family/Surgical/Social History: Medications & Allergies reviewed per EMR, new medications updated. Patient Active Problem List   Diagnosis Date Noted   Lower abdominal pain 05/31/2023   Vaginitis 02/02/2023   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    Other chronic pain 07/14/2022   Pure hypercholesterolemia 06/23/2022   Myofascial pain dysfunction syndrome 11/25/2018   Nerve pain 11/25/2018   Essential hypertension 09/19/2018   Insomnia 09/19/2018   Hematuria 09/19/2018   Left ankle pain 06/25/2018   Lumbar spine pain 06/25/2018   Thoracic spine pain 06/25/2018   Cervical spine pain 06/25/2018   Hidradenitis suppurativa 01/18/2018   Constipation 10/03/2017   Morbid obesity (HCC) 08/17/2017   Tobacco abuse 06/03/2012   Preventative health care 05/03/2011   Diabetes mellitus with neuropathy (HCC) 02/24/2008   Polycystic ovarian disease 07/27/2006   GERD (gastroesophageal reflux disease) 03/02/2006   Past Medical History:  Diagnosis Date   Chest pain    multiple ED evaluations, most likely musculoskeletal (cervical radiculopathy), needs MRI spine   Diabetes mellitus without complication (HCC)    Diverticulitis 2019   GERD (gastroesophageal reflux disease)    Gestational diabetes    Hidradenitis suppurativa 01/18/2018   History of diverticulitis 10/03/2017   Hypertension    NSTEMI (non-ST elevated myocardial infarction) (HCC) 06/18/2022   PCOD (polycystic ovarian disease)    Followed by [facility], has been on metformin    PCOS (polycystic ovarian syndrome)    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   Family History  Problem Relation Age of Onset   Diabetes Mother    Hypertension Mother    Stomach cancer Mother  diet at 57   Heart disease Father        diet at 60   Diabetes Sister    Hypertension Sister    Diabetes Brother    Cancer Maternal Grandmother    Cancer Paternal Grandmother    Past Surgical History:  Procedure Laterality Date   CESAREAN SECTION  12/14/2011   Procedure: CESAREAN SECTION;  Surgeon: Olam Mill, MD;  Location: [facility];  Service: Obstetrics;  Laterality: N/A;  Primary Cesarean Section Delivery Girl @ [phone], Apgars   LEFT HEART CATH AND CORONARY ANGIOGRAPHY N/A 06/19/2022   Procedure: LEFT  HEART CATH AND CORONARY ANGIOGRAPHY;  Surgeon: Anner Alm ORN, MD;  Location: [facility] INVASIVE CV LAB;  Service: Cardiovascular;  Laterality: N/A;   Social History   Occupational History   Not on file  Tobacco Use   Smoking status: Every Day    Current packs/day: 0.50    Types: Cigarettes   Smokeless tobacco: Current   Tobacco comments:    0.5ppd  Vaping Use   Vaping status: Never Used  Substance and Sexual Activity   Alcohol use: Not Currently    Comment: social   Drug use: Never   Sexual activity: Not Currently    Yuniel Blaney Estela) Arlinda, M.D. [name], Hand Surgery

## 2023-11-05 ENCOUNTER — Encounter: Payer: Self-pay | Admitting: Radiology

## 2023-11-06 ENCOUNTER — Other Ambulatory Visit: Payer: Self-pay | Admitting: Physician Assistant

## 2023-11-06 ENCOUNTER — Ambulatory Visit: Admitting: Physical Medicine and Rehabilitation

## 2023-11-06 DIAGNOSIS — R202 Paresthesia of skin: Secondary | ICD-10-CM | POA: Diagnosis not present

## 2023-11-06 DIAGNOSIS — M79602 Pain in left arm: Secondary | ICD-10-CM

## 2023-11-06 DIAGNOSIS — R200 Anesthesia of skin: Secondary | ICD-10-CM

## 2023-11-06 DIAGNOSIS — R29898 Other symptoms and signs involving the musculoskeletal system: Secondary | ICD-10-CM | POA: Diagnosis not present

## 2023-11-06 DIAGNOSIS — M5416 Radiculopathy, lumbar region: Secondary | ICD-10-CM | POA: Diagnosis not present

## 2023-11-06 NOTE — Progress Notes (Signed)
 Pain Scale   Average Pain 8 Patient advising she has pain, numbness, tingling and weakness in her left hand. Patient is left hand dominate. Patient advising she also has a cyst on her left wrist.        +Driver, -BT, -Dye Allergies.

## 2023-11-07 NOTE — Procedures (Signed)
 EMG & NCV Findings: Evaluation of the left median motor nerve showed prolonged distal onset latency (5.2 ms) and decreased conduction velocity (Elbow-Wrist, 45 m/s).  The left ulnar motor nerve showed decreased conduction velocity (B Elbow-Wrist, 51 m/s) and decreased conduction velocity (A Elbow-B Elbow, 40 m/s).  The left median (across palm) sensory nerve showed prolonged distal peak latency (Wrist, 5.3 ms), reduced amplitude (1.0 V), and prolonged distal peak latency (Palm, 4.9 ms).  The left ulnar sensory nerve showed prolonged distal peak latency (3.8 ms), reduced amplitude (11.3 V), and decreased conduction velocity (Wrist-5th Digit, 37 m/s).  All remaining nerves (as indicated in the following tables) were within normal limits.    All examined muscles (as indicated in the following table) showed no evidence of electrical instability.    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  There is no significant electrodiagnostic evidence of any other focal nerve entrapment, brachial plexopathy or cervical radiculopathy.  Recommendations: 1.  Continue current management of symptoms. 2.  Follow-up with referring physician. 3.  Suggest surgical evaluation.  ___________________________ Prentice Masters FAAPMR Board Certified, American Board of Physical Medicine and Rehabilitation    Nerve Conduction Studies Anti Sensory Summary Table   Stim Site NR Peak (ms) Norm Peak (ms) P-T Amp (V) Norm P-T Amp Site1 Site2 Delta-P (ms) Dist (cm) Vel (m/s) Norm Vel (m/s)  Left Median Acr Palm Anti Sensory (2nd Digit)   30C  Wrist    *5.3 <3.6 *1.0 >10 Wrist Palm 0.4 0.0    Palm    *4.9 <2.0 0.3         Left Radial Anti Sensory (Base 1st Digit)  30.4C  Wrist    2.4 <3.1 16.1  Wrist Base 1st Digit 2.4 0.0    Left Ulnar Anti Sensory (5th Digit)  30.8C  Wrist    *3.8 <3.7 *11.3 >15.0 Wrist 5th Digit 3.8 14.0 *37 >38   Motor Summary Table   Stim Site NR Onset (ms) Norm Onset (ms) O-P Amp (mV) Norm O-P Amp Site1 Site2 Delta-0 (ms) Dist (cm) Vel (m/s) Norm Vel (m/s)  Left Median Motor (Abd Poll Brev)  30.5C  Wrist    *5.2 <4.2 5.4 >5 Elbow Wrist 6.0 27.0 *45 >50  Elbow    11.2  8.0         Left Ulnar Motor (Abd Dig Min)  30.5C  Wrist    3.2 <4.2 5.2 >3 B Elbow Wrist 4.9 25.0 *51 >53  B Elbow    8.1  4.0  A Elbow B Elbow 2.5 10.0 *40 >53  A Elbow    10.6  3.3          EMG   Side Muscle Nerve Root Ins Act Fibs Psw Amp Dur Poly Recrt Int Bruna Comment  Left Abd Poll Brev Median C8-T1 Nml Nml Nml Nml Nml 0 Nml Nml   Left 1stDorInt Ulnar C8-T1 Nml Nml Nml Nml Nml 0 Nml Nml   Left PronatorTeres Median C6-7 Nml Nml Nml Nml Nml 0 Nml Nml   Left Biceps Musculocut C5-6 Nml Nml Nml Nml Nml 0 Nml Nml   Left Deltoid Axillary C5-6 Nml  Nml Nml Nml Nml 0 Nml Nml     Nerve Conduction Studies Anti Sensory Left/Right Comparison   Stim Site L Lat (ms) R Lat (ms) L-R Lat (ms) L Amp (V) R Amp (V) L-R Amp (%) Site1 Site2 L Vel (m/s) R Vel (m/s) L-R Vel (m/s)  Median Acr Palm Anti Sensory (2nd Digit)  30C  Wrist *5.3   *1.0   Wrist Palm     Palm *4.9   0.3         Radial Anti Sensory (Base 1st Digit)  30.4C  Wrist 2.4   16.1   Wrist Base 1st Digit     Ulnar Anti Sensory (5th Digit)  30.8C  Wrist *3.8   *11.3   Wrist 5th Digit *37     Motor Left/Right Comparison   Stim Site L Lat (ms) R Lat (ms) L-R Lat (ms) L Amp (mV) R Amp (mV) L-R Amp (%) Site1 Site2 L Vel (m/s) R Vel (m/s) L-R Vel (m/s)  Median Motor (Abd Poll Brev)  30.5C  Wrist *5.2   5.4   Elbow Wrist *45    Elbow 11.2   8.0         Ulnar Motor (Abd  Dig Min)  30.5C  Wrist 3.2   5.2   B Elbow Wrist *51    B Elbow 8.1   4.0   A Elbow B Elbow *40    A Elbow 10.6   3.3            Waveforms:

## 2023-11-08 DIAGNOSIS — Z794 Long term (current) use of insulin: Secondary | ICD-10-CM | POA: Diagnosis not present

## 2023-11-08 DIAGNOSIS — I4719 Other supraventricular tachycardia: Secondary | ICD-10-CM | POA: Diagnosis not present

## 2023-11-08 DIAGNOSIS — E119 Type 2 diabetes mellitus without complications: Secondary | ICD-10-CM | POA: Diagnosis not present

## 2023-11-08 DIAGNOSIS — Z1211 Encounter for screening for malignant neoplasm of colon: Secondary | ICD-10-CM | POA: Diagnosis not present

## 2023-11-08 DIAGNOSIS — I10 Essential (primary) hypertension: Secondary | ICD-10-CM | POA: Diagnosis not present

## 2023-11-08 DIAGNOSIS — Z6833 Body mass index (BMI) 33.0-33.9, adult: Secondary | ICD-10-CM | POA: Diagnosis not present

## 2023-11-12 ENCOUNTER — Encounter: Payer: Self-pay | Admitting: Physical Medicine and Rehabilitation

## 2023-11-12 NOTE — Progress Notes (Signed)
 Misty [name] - 43 y.o. female MRN [id number]  Date of birth: [birth year]  Office Visit Note: Visit Date: 11/06/2023 PCP: Norrine Sharper, MD Referred by: Arlinda Buster, MD  Subjective: Chief Complaint  Patient presents with   Left Hand - Pain, Numbness, Weakness   HPI: Misty [name] is a 43 y.o. female who comes in today at the request of Dr. Anshul Agarwala for evaluation and management of chronic, worsening and severe pain, numbness and tingling in the Left upper extremities.  Patient is Left hand dominant.  She reports ongoing symptoms for many months with somewhat progressive worsening.  She does endorse some neck pain but no frank radicular pain down the arm.  She denies any right-sided complaints.  Her case is complicated by history of pretty significant diabetes with hemoglobin A1c 12 range over the last year.  She does have a diagnosis of peripheral polyneuropathy.  On her left hand she is having a dorsal cyst that has been enlarging.  She has had symptoms mainly in the 4th and 5th digits to the elbow.  She occasionally gets symptoms in the thumb.  She does get a lot of nocturnal complaints.  She has had some weakness and dropping objects.  She has had medications including anti-inflammatory nerve membrane stabilizing medications and she is on chronic pain medications of oxycodone .  None of these are giving her any relief recently.  No prior electrodiagnostic study.   I spent more than 30 minutes speaking face-to-face with the patient with 50% of the time in counseling and discussing coordination of care.       Review of Systems  Musculoskeletal:  Positive for joint pain and neck pain.  Neurological:  Positive for tingling and focal weakness.  All other systems reviewed and are negative.  Otherwise per HPI.  Assessment & Plan: Visit Diagnoses:    ICD-10-CM   1. Paresthesia of skin  R20.2 NCV with EMG (electromyography)    2. Numbness and  tingling of left hand  R20.0 NCV with EMG (electromyography)   R20.2     3. Left arm pain  M79.602 NCV with EMG (electromyography)    4. 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  There is no significant electrodiagnostic evidence of any other focal nerve entrapment, brachial plexopathy or cervical radiculopathy.  Recommendations: 1.  Continue current management of symptoms. 2.  Follow-up with referring physician. 3.  Suggest surgical evaluation.  Meds & Orders: No orders of the defined types were placed in this encounter.   Orders Placed This Encounter  Procedures   NCV with EMG (electromyography)    Follow-up: Return for Anshul Agarwala, MD.   Procedures: No procedures performed  EMG & NCV Findings: Evaluation of the left median motor nerve showed prolonged distal onset latency (5.2 ms) and decreased conduction velocity (Elbow-Wrist, 45 m/s).  The left ulnar motor nerve showed decreased conduction velocity (B Elbow-Wrist, 51 m/s) and decreased conduction velocity (A Elbow-B Elbow, 40 m/s).  The left median (across palm) sensory nerve showed prolonged distal peak latency (Wrist, 5.3  ms), reduced amplitude (1.0 V), and prolonged distal peak latency (Palm, 4.9 ms).  The left ulnar sensory nerve showed prolonged distal peak latency (3.8 ms), reduced amplitude (11.3 V), and decreased conduction velocity (Wrist-5th Digit, 37 m/s).  All remaining nerves (as indicated in the following tables) were within normal limits.    All  examined muscles (as indicated in the following table) showed no evidence of electrical instability.    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  There is no significant electrodiagnostic evidence of any other focal nerve entrapment, brachial plexopathy or cervical radiculopathy.  Recommendations: 1.  Continue current management of symptoms. 2.  Follow-up with referring physician. 3.  Suggest surgical evaluation.  ___________________________ Prentice Masters FAAPMR Board Certified, American Board of Physical Medicine and Rehabilitation    Nerve Conduction Studies Anti Sensory Summary Table   Stim Site NR Peak (ms) Norm Peak (ms) P-T Amp (V) Norm P-T Amp Site1 Site2 Delta-P (ms) Dist (cm) Vel (m/s) Norm Vel (m/s)  Left Median Acr Palm Anti Sensory (2nd Digit)  30C  Wrist    *5.3 <3.6 *1.0 >10 Wrist Palm 0.4 0.0    Palm    *4.9 <2.0 0.3         Left Radial Anti Sensory (Base 1st Digit)  30.4C  Wrist    2.4 <3.1 16.1  Wrist Base 1st Digit 2.4 0.0    Left Ulnar Anti Sensory (5th Digit)  30.8C  Wrist    *3.8 <3.7 *11.3 >15.0 Wrist 5th Digit 3.8 14.0 *37 >38   Motor Summary Table   Stim Site NR Onset (ms) Norm Onset (ms) O-P Amp (mV) Norm O-P Amp Site1 Site2 Delta-0 (ms) Dist (cm) Vel (m/s) Norm Vel (m/s)  Left Median Motor (Abd Poll Brev)  30.5C  Wrist    *5.2 <4.2 5.4 >5 Elbow Wrist 6.0 27.0 *45 >50  Elbow    11.2  8.0         Left Ulnar Motor (Abd Dig Min)  30.5C  Wrist    3.2 <4.2 5.2 >3 B Elbow Wrist 4.9 25.0 *51 >53  B Elbow    8.1  4.0  A Elbow B Elbow 2.5 10.0 *40 >53  A Elbow     10.6  3.3          EMG   Side Muscle Nerve Root Ins Act Fibs Psw Amp Dur Poly Recrt Int Bruna Comment  Left Abd Poll Brev Median C8-T1 Nml Nml Nml Nml Nml 0 Nml Nml   Left 1stDorInt Ulnar C8-T1 Nml Nml Nml Nml Nml 0 Nml Nml   Left PronatorTeres Median C6-7 Nml Nml Nml Nml Nml 0 Nml Nml   Left Biceps Musculocut C5-6 Nml Nml Nml Nml Nml 0 Nml Nml   Left Deltoid Axillary C5-6 Nml Nml Nml Nml Nml 0 Nml Nml     Nerve Conduction Studies Anti Sensory Left/Right Comparison   Stim Site L Lat (ms) R Lat (ms) L-R Lat (ms) L Amp (V) R Amp (V) L-R Amp (%) Site1 Site2 L Vel (m/s) R Vel (m/s) L-R Vel (m/s)  Median Acr Palm Anti Sensory (2nd Digit)  30C  Wrist *5.3   *1.0   Wrist Palm     Palm *4.9   0.3         Radial Anti Sensory (Base 1st Digit)  30.4C  Wrist 2.4   16.1   Wrist Base 1st Digit     Ulnar Anti Sensory (5th Digit)  30.8C  Wrist *3.8   *11.3   Wrist 5th Digit *37     Motor Left/Right Comparison   Stim Site L Lat (ms) R Lat (ms) L-R Lat (ms) L Amp (mV) R Amp (mV) L-R Amp (%) Site1 Site2 L Vel (m/s) R Vel (m/s) L-R Vel (m/s)  Median Motor (Abd Poll Brev)  30.5C  Wrist *5.2   5.4   Elbow Wrist *45    Elbow 11.2   8.0         Ulnar Motor (Abd Dig Min)  30.5C  Wrist 3.2   5.2   B Elbow Wrist *51    B Elbow 8.1   4.0   A Elbow B Elbow *40    A Elbow 10.6   3.3            Waveforms:            Clinical History: No specialty comments available.   She reports that she has been smoking cigarettes. She uses smokeless tobacco.  Recent Labs    01/31/23 1128 05/31/23 1136 10/19/23 1051  HGBA1C 10.3* 12.7* 10.7*    Objective:  VS:  HT:    WT:   BMI:     BP:   HR: bpm  TEMP: ( )  RESP:  Physical Exam Vitals and nursing note reviewed.  Constitutional:      General: She is not in acute distress.    Appearance: Normal appearance. She is well-developed. She is obese. She is not ill-appearing.  HENT:     Head: Normocephalic and atraumatic.  Eyes:      Conjunctiva/sclera: Conjunctivae normal.     Pupils: Pupils are equal, round, and reactive to light.  Cardiovascular:     Rate and Rhythm: Normal rate.     Pulses: Normal pulses.  Pulmonary:     Effort: Pulmonary effort is normal.  Musculoskeletal:        General: Tenderness present. No swelling or deformity.     Right lower leg: No edema.     Left lower leg: No edema.     Comments: Inspection reveals no atrophy of the bilateral APB or FDI or hand intrinsics. There is no swelling, color changes, allodynia or dystrophic changes. There is 5 out of 5 strength in the bilateral wrist extension, finger abduction and long finger flexion.  There is subjective impaired sensation in the ulnar more than median nerve distribution on the left. There is a negative Froment's test on the left. There is a equivocally Phalen's test on the left. There is a negative Hoffmann's test bilaterally.  Skin:    General: Skin is warm and dry.     Findings: No erythema or rash.  Neurological:     General: No focal deficit present.     Mental Status: She is alert and oriented to person, place, and time.     Cranial Nerves: No cranial nerve deficit.     Sensory: Sensory deficit present.     Motor: Weakness present. No abnormal muscle tone.     Coordination: Coordination normal.     Gait: Gait abnormal.  Psychiatric:        Mood  and Affect: Mood normal.        Behavior: Behavior normal.     Ortho Exam  Imaging: No results found.  Past Medical/Family/Surgical/Social History: Medications & Allergies reviewed per EMR, new medications updated. Patient Active Problem List   Diagnosis Date Noted   Lower abdominal pain 05/31/2023   Vaginitis 02/02/2023   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   Other chronic pain 07/14/2022   Pure hypercholesterolemia 06/23/2022   Myofascial pain dysfunction syndrome 11/25/2018   Nerve pain 11/25/2018   Essential hypertension 09/19/2018   Insomnia  09/19/2018   Hematuria 09/19/2018   Left ankle pain 06/25/2018   Lumbar spine pain 06/25/2018   Thoracic spine pain 06/25/2018   Cervical spine pain 06/25/2018   Hidradenitis suppurativa 01/18/2018   Constipation 10/03/2017   Morbid obesity (HCC) 08/17/2017   Tobacco abuse 06/03/2012   Preventative health care 05/03/2011   Diabetes mellitus with neuropathy (HCC) 02/24/2008   Polycystic ovarian disease 07/27/2006   GERD (gastroesophageal reflux disease) 03/02/2006   Past Medical History:  Diagnosis Date   Chest pain    multiple ED evaluations, most likely musculoskeletal (cervical radiculopathy), needs MRI spine   Diabetes mellitus without complication (HCC)    Diverticulitis 2019   GERD (gastroesophageal reflux disease)    Gestational diabetes    Hidradenitis suppurativa 01/18/2018   History of diverticulitis 10/03/2017   Hypertension    NSTEMI (non-ST elevated myocardial infarction) (HCC) 06/18/2022   PCOD (polycystic ovarian disease)    Followed by [facility], has been on metformin    PCOS (polycystic ovarian syndrome)    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   Family History  Problem Relation Age of Onset   Diabetes Mother    Hypertension Mother    Stomach cancer Mother        diet at 50   Heart disease Father        diet at 40   Diabetes Sister    Hypertension Sister    Diabetes Brother    Cancer Maternal Grandmother    Cancer Paternal Grandmother    Past Surgical History:  Procedure Laterality Date   CESAREAN SECTION  12/14/2011   Procedure: CESAREAN SECTION;  Surgeon: Olam Mill, MD;  Location: [facility];  Service: Obstetrics;  Laterality: N/A;  Primary Cesarean Section Delivery Girl @ [phone], Apgars   LEFT HEART CATH AND CORONARY ANGIOGRAPHY N/A 06/19/2022   Procedure: LEFT HEART CATH AND CORONARY ANGIOGRAPHY;  Surgeon: Anner Alm ORN, MD;  Location: [facility] INVASIVE CV LAB;  Service: Cardiovascular;  Laterality: N/A;   Social History   Occupational History   Not on  file  Tobacco Use   Smoking status: Every Day    Current packs/day: 0.50    Types: Cigarettes   Smokeless tobacco: Current   Tobacco comments:    0.5ppd  Vaping Use   Vaping status: Never Used  Substance and Sexual Activity   Alcohol use: Not Currently    Comment: social   Drug use: Never   Sexual activity: Not Currently

## 2023-11-19 DIAGNOSIS — M5030 Other cervical disc degeneration, unspecified cervical region: Secondary | ICD-10-CM | POA: Diagnosis not present

## 2023-11-19 DIAGNOSIS — N914 Secondary oligomenorrhea: Secondary | ICD-10-CM | POA: Diagnosis not present

## 2023-11-19 DIAGNOSIS — E669 Obesity, unspecified: Secondary | ICD-10-CM | POA: Diagnosis not present

## 2023-11-19 DIAGNOSIS — F17210 Nicotine dependence, cigarettes, uncomplicated: Secondary | ICD-10-CM | POA: Diagnosis not present

## 2023-11-19 DIAGNOSIS — Z79899 Other long term (current) drug therapy: Secondary | ICD-10-CM | POA: Diagnosis not present

## 2023-11-19 DIAGNOSIS — M549 Dorsalgia, unspecified: Secondary | ICD-10-CM | POA: Diagnosis not present

## 2023-11-22 DIAGNOSIS — Z79899 Other long term (current) drug therapy: Secondary | ICD-10-CM | POA: Diagnosis not present

## 2023-11-25 NOTE — Progress Notes (Unsigned)
EMG follow up.

## 2023-11-26 ENCOUNTER — Ambulatory Visit: Admitting: Orthopedic Surgery

## 2023-11-26 DIAGNOSIS — G5602 Carpal tunnel syndrome, left upper limb: Secondary | ICD-10-CM | POA: Diagnosis not present

## 2023-11-26 DIAGNOSIS — G5622 Lesion of ulnar nerve, left upper limb: Secondary | ICD-10-CM

## 2023-12-04 ENCOUNTER — Encounter (HOSPITAL_COMMUNITY): Payer: Self-pay | Admitting: Emergency Medicine

## 2023-12-04 ENCOUNTER — Ambulatory Visit (INDEPENDENT_AMBULATORY_CARE_PROVIDER_SITE_OTHER)

## 2023-12-04 ENCOUNTER — Ambulatory Visit (HOSPITAL_COMMUNITY)
Admission: EM | Admit: 2023-12-04 | Discharge: 2023-12-04 | Disposition: A | Discharge status: Home / Self Care | Attending: Family Medicine | Admitting: Family Medicine

## 2023-12-04 DIAGNOSIS — R072 Precordial pain: Secondary | ICD-10-CM

## 2023-12-04 DIAGNOSIS — M5442 Lumbago with sciatica, left side: Secondary | ICD-10-CM | POA: Diagnosis not present

## 2023-12-04 DIAGNOSIS — R079 Chest pain, unspecified: Secondary | ICD-10-CM

## 2023-12-04 DIAGNOSIS — Z041 Encounter for examination and observation following transport accident: Secondary | ICD-10-CM | POA: Diagnosis not present

## 2023-12-04 DIAGNOSIS — M545 Low back pain, unspecified: Secondary | ICD-10-CM | POA: Diagnosis not present

## 2023-12-04 MED ORDER — TIZANIDINE HCL 4 MG PO TABS: 4.0000 mg | ORAL_TABLET | Freq: Three times a day (TID) | ORAL | 0 refills | Status: AC | PRN

## 2023-12-04 MED ORDER — KETOROLAC TROMETHAMINE 30 MG/ML IJ SOLN
30.0000 mg | Freq: Once | INTRAMUSCULAR | Status: AC
  Administered 2023-12-04: 30 mg via INTRAMUSCULAR

## 2023-12-04 MED ORDER — KETOROLAC TROMETHAMINE 10 MG PO TABS: 10.0000 mg | ORAL_TABLET | Freq: Four times a day (QID) | ORAL | 0 refills | Status: AC | PRN

## 2023-12-04 MED ORDER — KETOROLAC TROMETHAMINE 30 MG/ML IJ SOLN
INTRAMUSCULAR | Status: AC
  Filled 2023-12-04: qty 1
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
## 2023-12-04 NOTE — Discharge Instructions (Addendum)
 X-rays by my review do not show any broken bones.  The radiologist will also read your x-ray, and if their interpretation differs significantly from mine, and the management of your condition would change, we will call you.  You have been given a shot of Toradol  30 mg today.  Ketorolac  10 mg tablets--take 1 tablet every 6 hours as needed for pain.  This is the same medicine that is in the shot we just gave you   Take tizanidine 4 mg--1 every 8 hours as needed for muscle spasms; this medication can cause dizziness and sleepiness  Please follow-up with your primary care
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
## 2023-12-17 DIAGNOSIS — N914 Secondary oligomenorrhea: Secondary | ICD-10-CM | POA: Diagnosis not present

## 2023-12-17 DIAGNOSIS — Z79899 Other long term (current) drug therapy: Secondary | ICD-10-CM | POA: Diagnosis not present

## 2023-12-17 DIAGNOSIS — E559 Vitamin D deficiency, unspecified: Secondary | ICD-10-CM | POA: Diagnosis not present

## 2023-12-17 DIAGNOSIS — M549 Dorsalgia, unspecified: Secondary | ICD-10-CM | POA: Diagnosis not present

## 2023-12-17 DIAGNOSIS — M5030 Other cervical disc degeneration, unspecified cervical region: Secondary | ICD-10-CM | POA: Diagnosis not present

## 2023-12-17 DIAGNOSIS — K5909 Other constipation: Secondary | ICD-10-CM | POA: Diagnosis not present

## 2023-12-17 DIAGNOSIS — K219 Gastro-esophageal reflux disease without esophagitis: Secondary | ICD-10-CM | POA: Diagnosis not present

## 2023-12-17 DIAGNOSIS — E118 Type 2 diabetes mellitus with unspecified complications: Secondary | ICD-10-CM | POA: Diagnosis not present

## 2023-12-17 DIAGNOSIS — E669 Obesity, unspecified: Secondary | ICD-10-CM | POA: Diagnosis not present

## 2023-12-19 DIAGNOSIS — Z79899 Other long term (current) drug therapy: Secondary | ICD-10-CM | POA: Diagnosis not present

## 2024-01-22 ENCOUNTER — Ambulatory Visit
Admission: EM | Admit: 2024-01-22 | Discharge: 2024-01-22 | Disposition: A | Discharge status: Home / Self Care | Attending: Family Medicine | Admitting: Family Medicine

## 2024-01-22 DIAGNOSIS — N760 Acute vaginitis: Secondary | ICD-10-CM | POA: Diagnosis not present

## 2024-01-22 DIAGNOSIS — N3001 Acute cystitis with hematuria: Secondary | ICD-10-CM | POA: Insufficient documentation

## 2024-01-22 DIAGNOSIS — R300 Dysuria: Secondary | ICD-10-CM | POA: Insufficient documentation

## 2024-01-22 LAB — POCT URINE DIPSTICK
Bilirubin, UA: NEGATIVE
Glucose, UA: 500 mg/dL — AB
Ketones, POC UA: NEGATIVE mg/dL
Nitrite, UA: NEGATIVE
POC PROTEIN,UA: NEGATIVE
Spec Grav, UA: <=1.005 — AB
Urobilinogen, UA: 0.2 U/dL
pH, UA: 5.5

## 2024-01-22 LAB — POCT URINE PREGNANCY: Preg Test, Ur: NEGATIVE

## 2024-01-22 MED ORDER — CEPHALEXIN 500 MG PO CAPS: 500.0000 mg | ORAL_CAPSULE | Freq: Two times a day (BID) | ORAL | 0 refills | Status: AC

## 2024-01-22 MED ORDER — FLUCONAZOLE 150 MG PO TABS: 150.0000 mg | ORAL_TABLET | Freq: Every day | ORAL | 0 refills | Status: AC

## 2024-01-22 NOTE — ED Provider Notes (Signed)
 " [facility]    CSN: [id number] Arrival date & time: 01/22/24  1207      History   Chief Complaint No chief complaint on file.   HPI Misty [name] is a 44 y.o. female presents for dysuria.  Patient reports 2 days of urinary burning urgency and frequency.  Denies hematuria, fevers, vomiting or flank pain.  Does endorse some vaginal itching without discharge.  No known STD exposure or concern but would like screening.  Does have a history of yeast infections and states this feels similar.  She has been using badges sill OTC without improvement.  No other concerns.  HPI  Past Medical History:  Diagnosis Date   Chest pain    multiple ED evaluations, most likely musculoskeletal (cervical radiculopathy), needs MRI spine   Diabetes mellitus without complication (HCC)    Diverticulitis 2019   GERD (gastroesophageal reflux disease)    Gestational diabetes    Hidradenitis suppurativa 01/18/2018   History of diverticulitis 10/03/2017   Hypertension    NSTEMI (non-ST elevated myocardial infarction) (HCC) 06/18/2022   PCOD (polycystic ovarian disease)    Followed by [facility], has been on metformin    PCOS (polycystic ovarian syndrome)    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    Patient Active Problem List   Diagnosis Date Noted   Lower abdominal pain 05/31/2023   Vaginitis 02/02/2023   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   Other chronic pain 07/14/2022   Pure hypercholesterolemia 06/23/2022   Myofascial pain dysfunction syndrome 11/25/2018   Nerve pain 11/25/2018   Essential hypertension 09/19/2018   Insomnia 09/19/2018   Hematuria 09/19/2018   Left ankle pain 06/25/2018   Lumbar spine pain 06/25/2018   Thoracic spine pain 06/25/2018   Cervical spine pain 06/25/2018   Hidradenitis suppurativa 01/18/2018   Constipation 10/03/2017   Morbid obesity (HCC) 08/17/2017   Tobacco abuse 06/03/2012   Preventative health care 05/03/2011    Diabetes mellitus with neuropathy (HCC) 02/24/2008   Polycystic ovarian disease 07/27/2006   GERD (gastroesophageal reflux disease) 03/02/2006    Past Surgical History:  Procedure Laterality Date   CESAREAN SECTION  12/14/2011   Procedure: CESAREAN SECTION;  Surgeon: Olam Mill, MD;  Location: [facility];  Service: Obstetrics;  Laterality: N/A;  Primary Cesarean Section Delivery Girl @ [phone], Apgars   LEFT HEART CATH AND CORONARY ANGIOGRAPHY N/A 06/19/2022   Procedure: LEFT HEART CATH AND CORONARY ANGIOGRAPHY;  Surgeon: Anner Alm ORN, MD;  Location: [facility] INVASIVE CV LAB;  Service: Cardiovascular;  Laterality: N/A;    OB History     Gravida  1   Para  1   Term  1   Preterm  0   AB  0   Living  1      SAB  0   IAB  0   Ectopic  0   Multiple      Live Births  1            Home Medications    Prior to Admission medications  Medication Sig Start Date End Date Taking? Authorizing Provider  cephALEXin  (KEFLEX ) 500 MG capsule Take 1 capsule (500 mg total) by mouth 2 (two) times daily for 7 days. 01/22/24 01/29/24 Yes Travontae Freiberger, Jodi R, NP  fluconazole  (DIFLUCAN ) 150 MG tablet Take 1 tablet (150 mg total) by mouth daily for 2 doses. Take 1 tablet today and you may repeat in 3 days if symptoms persist 01/22/24 01/24/24 Yes Loreda Myla JONELLE, NP  Accu-Chek FastClix Lancets MISC Use twice per day to check your blood glucose.  E11.65 09/18/18   Arnett Saunders, MD  acetaminophen  (TYLENOL ) 500 MG tablet Take 1,500 mg by mouth daily.    [provider]  Blood Glucose Monitoring Suppl (ACCU-CHEK GUIDE) w/Device KIT Use to check blood glucose. 07/14/22   Norrine Sharper, MD  Continuous Glucose Sensor (FREESTYLE LIBRE 3 SENSOR) MISC Place 1 sensor on the skin every 14 days. Use to check glucose continuously 07/14/22   Norrine Sharper, MD  diltiazem  (CARDIZEM  CD) 360 MG 24 hr capsule Take 1 capsule (360 mg total) by mouth daily. 09/26/22   Norrine Sharper, MD  Ergocalciferol   (VITAMIN D2) 10 MCG (400 UNIT) TABS Take 2 tablets by mouth daily before breakfast. 07/17/22   Norrine Sharper, MD  famotidine  (PEPCID ) 20 MG tablet Take 1 tablet (20 mg total) by mouth 2 (two) times daily as needed for heartburn or indigestion. 10/19/23   D'Mello, Rosalyn, DO  glucose blood (ACCU-CHEK GUIDE) test strip Use one strip, twice daily to check your blood glucose.  E11.65 09/18/18   Arnett Saunders, MD  insulin  glargine (LANTUS  SOLOSTAR) 100 UNIT/ML Solostar Pen Inject 20 Units into the skin daily. 10 units in the am, and 10 units at night. 05/31/23   Rosan Dayton BROCKS, DO  isosorbide  mononitrate (IMDUR ) 30 MG 24 hr tablet TAKE 1 TABLET(30 MG) BY MOUTH DAILY 11/08/23   Jeffrie Oneil BROCKS, MD  ketorolac  (TORADOL ) 10 MG tablet Take 1 tablet (10 mg total) by mouth every 6 (six) hours as needed (pain). 12/04/23   Banister, Pamela K, MD  LINZESS 72 MCG capsule Take 72 mcg by mouth daily. 05/02/23   [provider]  lisinopril  (ZESTRIL ) 10 MG tablet Take 1 tablet (10 mg total) by mouth daily. 10/19/23 01/17/24  D'Mello, Rosalyn, DO  lisinopril -hydrochlorothiazide  (ZESTORETIC ) 10-12.5 MG tablet Take 1 tablet by mouth daily. 03/08/23   [provider]  metFORMIN  (GLUCOPHAGE -XR) 500 MG 24 hr tablet Take 1 tablet (500 mg total) by mouth daily with breakfast. 05/31/23   Rosan Dayton BROCKS, DO  nitroGLYCERIN  (NITROSTAT ) 0.4 MG SL tablet Place 1 tablet (0.4 mg total) under the tongue every 5 (five) minutes as needed for chest pain. Patient not taking: Reported on 09/05/2023 07/19/22 10/17/22  Lelon Hamilton T, PA-C  oxyCODONE -acetaminophen  (PERCOCET) 10-325 MG tablet Take 1 tablet by mouth every 6 (six) hours as needed for pain.    [provider]  pregabalin (LYRICA) 75 MG capsule Take 75 mg by mouth 2 (two) times daily.    [provider]  rosuvastatin  (CRESTOR ) 20 MG tablet TAKE 1 TABLET(20 MG) BY MOUTH DAILY 11/08/23   Jeffrie Oneil BROCKS, MD  TIADYLT  ER 360 MG 24 hr capsule Take 1 capsule  (360 mg total) by mouth daily. 10/19/23   D'Mello, Rosalyn, DO  tiZANidine  (ZANAFLEX ) 4 MG tablet Take 1 tablet (4 mg total) by mouth every 8 (eight) hours as needed for muscle spasms. 12/04/23   Vonna Sharlet POUR, MD  Vitamin D , Ergocalciferol , (DRISDOL) 1.25 MG (50000 UNIT) CAPS capsule Take 50,000 Units by mouth once a week. 05/02/23   [provider]    Family History Family History  Problem Relation Age of Onset   Diabetes Mother    Hypertension Mother    Stomach cancer Mother        diet at 63   Heart disease Father        diet at 87   Diabetes Sister  Hypertension Sister    Diabetes Brother    Cancer Maternal Grandmother    Cancer Paternal Grandmother     Social History Social History[1]   Allergies   Patient has no known allergies.   Review of Systems Review of Systems  Genitourinary:  Positive for dysuria.       Vaginal itching     Physical Exam Triage Vital Signs ED Triage Vitals  Encounter Vitals Group     BP 01/22/24 1321 133/70     Girls Systolic BP Percentile --      Girls Diastolic BP Percentile --      Boys Systolic BP Percentile --      Boys Diastolic BP Percentile --      Pulse Rate 01/22/24 1320 (!) 109     Resp --      Temp 01/22/24 1321 97.7 F (36.5 C)     Temp Source 01/22/24 1321 Oral     SpO2 01/22/24 1320 98 %     Weight --      Height --      Head Circumference --      Peak Flow --      Pain Score 01/22/24 1319 0     Pain Loc --      Pain Education --      Exclude from Growth Chart --    No data found.  Updated Vital Signs BP 133/70   Pulse (!) 109   Temp 97.7 F (36.5 C) (Oral)   LMP 01/08/2024 (Exact Date)   SpO2 98%   Visual Acuity Right Eye Distance:   Left Eye Distance:   Bilateral Distance:    Right Eye Near:   Left Eye Near:    Bilateral Near:     Physical Exam Vitals and nursing note reviewed.  Constitutional:      Appearance: Normal appearance.  HENT:     Head: Normocephalic and  atraumatic.  Eyes:     Pupils: Pupils are equal, round, and reactive to light.  Cardiovascular:     Rate and Rhythm: Normal rate.  Pulmonary:     Effort: Pulmonary effort is normal.  Skin:    General: Skin is warm and dry.  Neurological:     General: No focal deficit present.     Mental Status: She is alert and oriented to person, place, and time.  Psychiatric:        Mood and Affect: Mood normal.        Behavior: Behavior normal.      UC Treatments / Results  Labs (all labs ordered are listed, but only abnormal results are displayed) Labs Reviewed  POCT URINE DIPSTICK - Abnormal; Notable for the following components:      Result Value   Color, UA light yellow (*)    Clarity, UA cloudy (*)    Glucose, UA =500 (*)    Spec Grav, UA <=1.005 (*)    Blood, UA trace-lysed (*)    Leukocytes, UA Trace (*)    All other components within normal limits  URINE CULTURE  POCT URINE PREGNANCY  CERVICOVAGINAL ANCILLARY ONLY    EKG   Radiology No results found.  Procedures Procedures (including critical care time)  Medications Ordered in UC Medications - No data to display  Initial Impression / Assessment and Plan / UC Course  I have reviewed the triage vital signs and the nursing notes.  Pertinent labs & imaging results that were available during my care of the  patient were reviewed by me and considered in my medical decision making (see chart for details).     Urine hCG negative, UA positive for UTI, will culture and start Keflex .  Vaginal swab/STD testing is ordered and will contact for any positive results.  Start Diflucan  for yeast infection symptoms.  Discussed rest fluids and PCP or GYN follow-up if symptoms do not improve.  ER precautions reviewed. Final Clinical Impressions(s) / UC Diagnoses   Final diagnoses:  Dysuria  Acute cystitis with hematuria  Acute vaginitis     Discharge Instructions      The clinic will contact you with results of the urine  culture as well as vaginal swab/STD testing  done today if positive.  Start Keflex  twice daily for 7 days for UTI.  Start Diflucan  as prescribed for your yeast infection symptoms.  Lots of rest and fluids and follow-up with your PCP or gynecologist if your symptoms do not improve.  Please go to the ER for any worsening symptoms.  Hope you feel better soon!   ED Prescriptions     Medication Sig Dispense Auth. Provider   cephALEXin  (KEFLEX ) 500 MG capsule Take 1 capsule (500 mg total) by mouth 2 (two) times daily for 7 days. 14 capsule Marvene Strohm, Jodi R, NP   fluconazole  (DIFLUCAN ) 150 MG tablet Take 1 tablet (150 mg total) by mouth daily for 2 doses. Take 1 tablet today and you may repeat in 3 days if symptoms persist 2 tablet Maxi Rodas, Jodi R, NP      PDMP not reviewed this encounter.    [1]  Social History Tobacco Use   Smoking status: Every Day    Current packs/day: 0.50    Types: Cigarettes   Smokeless tobacco: Current   Tobacco comments:    0.5ppd  Vaping Use   Vaping status: Never Used  Substance Use Topics   Alcohol use: Not Currently    Comment: social   Drug use: Never     Loreda Myla SAUNDERS, NP 01/22/24 1408  "

## 2024-01-22 NOTE — Discharge Instructions (Addendum)
 The clinic will contact you with results of the urine culture as well as vaginal swab/STD testing  done today if positive.  Start Keflex  twice daily for 7 days for UTI.  Start Diflucan  as prescribed for your yeast infection symptoms.  Lots of rest and fluids and follow-up with your PCP or gynecologist if your symptoms do not improve.  Please go to the ER for any worsening symptoms.  Hope you feel better soon!

## 2024-01-22 NOTE — ED Triage Notes (Addendum)
 Pt present with c/o possible UTI. States she has vaginal itching. Pt states she has urinary frequency and burning when voiding. Pt states she applied vagisil but did not have any relief.

## 2024-01-23 LAB — CERVICOVAGINAL ANCILLARY ONLY
Bacterial Vaginitis (gardnerella): POSITIVE — AB
Candida Glabrata: POSITIVE — AB
Candida Vaginitis: POSITIVE — AB
Chlamydia: NEGATIVE
Comment: NEGATIVE
Comment: NEGATIVE
Comment: NEGATIVE
Comment: NEGATIVE
Comment: NEGATIVE
Comment: NORMAL
Neisseria Gonorrhea: POSITIVE — AB
Trichomonas: NEGATIVE

## 2024-01-23 LAB — URINE CULTURE: Culture: NO GROWTH

## 2024-01-25 ENCOUNTER — Ambulatory Visit (HOSPITAL_COMMUNITY): Payer: Self-pay

## 2024-01-25 ENCOUNTER — Ambulatory Visit
Admission: EM | Admit: 2024-01-25 | Discharge: 2024-01-25 | Disposition: A | Discharge status: Home / Self Care | Source: Ambulatory Visit | Attending: Nurse Practitioner | Admitting: Nurse Practitioner

## 2024-01-25 DIAGNOSIS — A549 Gonococcal infection, unspecified: Secondary | ICD-10-CM | POA: Diagnosis not present

## 2024-01-25 MED ORDER — CEFTRIAXONE SODIUM 500 MG IJ SOLR
500.0000 mg | Freq: Once | INTRAMUSCULAR | Status: AC
  Administered 2024-01-25: 500 mg via INTRAMUSCULAR

## 2024-01-25 MED ORDER — METRONIDAZOLE 500 MG PO TABS: 500.0000 mg | ORAL_TABLET | Freq: Two times a day (BID) | ORAL | 0 refills | Status: AC

## 2024-01-25 NOTE — ED Triage Notes (Signed)
 Pt present for STD treatment.

## 2024-04-15 ENCOUNTER — Ambulatory Visit: Admitting: Physician Assistant
